# Patient Record
Sex: Female | Born: 1940
Health system: Southern US, Community
[De-identification: ages and names within clinical notes are randomized; demographics above are authoritative.]

## PROBLEM LIST (undated history)

## (undated) DIAGNOSIS — Z96619 Presence of unspecified artificial shoulder joint: Secondary | ICD-10-CM

## (undated) DIAGNOSIS — D649 Anemia, unspecified: Secondary | ICD-10-CM

## (undated) DIAGNOSIS — J189 Pneumonia, unspecified organism: Secondary | ICD-10-CM

## (undated) DIAGNOSIS — G4733 Obstructive sleep apnea (adult) (pediatric): Secondary | ICD-10-CM

## (undated) DIAGNOSIS — K219 Gastro-esophageal reflux disease without esophagitis: Secondary | ICD-10-CM

## (undated) DIAGNOSIS — Z8719 Personal history of other diseases of the digestive system: Secondary | ICD-10-CM

## (undated) DIAGNOSIS — I10 Essential (primary) hypertension: Secondary | ICD-10-CM

## (undated) DIAGNOSIS — T4145XA Adverse effect of unspecified anesthetic, initial encounter: Secondary | ICD-10-CM

## (undated) DIAGNOSIS — T8859XA Other complications of anesthesia, initial encounter: Secondary | ICD-10-CM

## (undated) DIAGNOSIS — M1990 Unspecified osteoarthritis, unspecified site: Secondary | ICD-10-CM

## (undated) DIAGNOSIS — C4491 Basal cell carcinoma of skin, unspecified: Secondary | ICD-10-CM

## (undated) HISTORY — DX: Anemia, unspecified: D64.9

## (undated) HISTORY — DX: Presence of unspecified artificial shoulder joint: Z96.619

## (undated) HISTORY — DX: Gastro-esophageal reflux disease without esophagitis: K21.9

## (undated) HISTORY — DX: Obstructive sleep apnea (adult) (pediatric): G47.33

## (undated) HISTORY — PX: SHOULDER ARTHROSCOPY: SHX128

## (undated) HISTORY — PX: ABDOMINAL HYSTERECTOMY: SHX81

## (undated) HISTORY — DX: Essential (primary) hypertension: I10

## (undated) HISTORY — DX: Unspecified osteoarthritis, unspecified site: M19.90

## (undated) HISTORY — PX: EYE SURGERY: SHX253

## (undated) HISTORY — PX: FINGER SURGERY: SHX640

## (undated) HISTORY — PX: NASAL SINUS SURGERY: SHX719

## (undated) HISTORY — PX: JOINT REPLACEMENT: SHX530

## (undated) HISTORY — PX: TONSILLECTOMY: SUR1361

## (undated) HISTORY — PX: OTHER SURGICAL HISTORY: SHX169

## (undated) HISTORY — DX: Pneumonia, unspecified organism: J18.9

## (undated) HISTORY — PX: UPPER GASTROINTESTINAL ENDOSCOPY: SHX188

---
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
## 1998-09-28 ENCOUNTER — Ambulatory Visit: Admission: RE | Admit: 1998-09-28 | Discharge: 1998-09-28 | Payer: Self-pay | Admitting: Emergency Medicine

## 1999-11-12 ENCOUNTER — Encounter: Payer: Self-pay | Admitting: Emergency Medicine

## 1999-11-12 ENCOUNTER — Encounter: Admission: RE | Admit: 1999-11-12 | Discharge: 1999-11-12 | Payer: Self-pay | Admitting: Emergency Medicine

## 1999-11-21 ENCOUNTER — Ambulatory Visit: Admission: RE | Admit: 1999-11-21 | Discharge: 1999-11-21 | Payer: Self-pay | Admitting: Emergency Medicine

## 1999-12-19 ENCOUNTER — Encounter: Admission: RE | Admit: 1999-12-19 | Discharge: 1999-12-19 | Payer: Self-pay | Admitting: Emergency Medicine

## 1999-12-19 ENCOUNTER — Encounter: Payer: Self-pay | Admitting: Emergency Medicine

## 2000-12-22 ENCOUNTER — Encounter: Payer: Self-pay | Admitting: Emergency Medicine

## 2000-12-22 ENCOUNTER — Encounter: Admission: RE | Admit: 2000-12-22 | Discharge: 2000-12-22 | Payer: Self-pay | Admitting: Emergency Medicine

## 2001-10-15 ENCOUNTER — Encounter: Admission: RE | Admit: 2001-10-15 | Discharge: 2001-10-15 | Payer: Self-pay | Admitting: Emergency Medicine

## 2001-10-15 ENCOUNTER — Encounter: Payer: Self-pay | Admitting: Emergency Medicine

## 2001-10-21 ENCOUNTER — Encounter: Payer: Self-pay | Admitting: Emergency Medicine

## 2001-10-21 ENCOUNTER — Encounter: Admission: RE | Admit: 2001-10-21 | Discharge: 2001-10-21 | Payer: Self-pay | Admitting: Emergency Medicine

## 2001-11-04 ENCOUNTER — Encounter: Admission: RE | Admit: 2001-11-04 | Discharge: 2001-11-04 | Payer: Self-pay | Admitting: Emergency Medicine

## 2001-11-04 ENCOUNTER — Encounter: Payer: Self-pay | Admitting: Emergency Medicine

## 2001-12-29 ENCOUNTER — Encounter: Admission: RE | Admit: 2001-12-29 | Discharge: 2001-12-29 | Payer: Self-pay | Admitting: Emergency Medicine

## 2001-12-29 ENCOUNTER — Encounter: Payer: Self-pay | Admitting: Emergency Medicine

## 2002-03-01 ENCOUNTER — Ambulatory Visit (HOSPITAL_COMMUNITY): Admission: RE | Admit: 2002-03-01 | Discharge: 2002-03-01 | Payer: Self-pay | Admitting: Emergency Medicine

## 2002-03-01 ENCOUNTER — Encounter: Payer: Self-pay | Admitting: Emergency Medicine

## 2003-01-10 ENCOUNTER — Encounter: Payer: Self-pay | Admitting: Emergency Medicine

## 2003-01-10 ENCOUNTER — Encounter: Admission: RE | Admit: 2003-01-10 | Discharge: 2003-01-10 | Payer: Self-pay | Admitting: Emergency Medicine

## 2003-05-13 ENCOUNTER — Ambulatory Visit (HOSPITAL_COMMUNITY): Admission: RE | Admit: 2003-05-13 | Discharge: 2003-05-13 | Payer: Self-pay | Admitting: Gastroenterology

## 2003-09-16 ENCOUNTER — Encounter: Admission: RE | Admit: 2003-09-16 | Discharge: 2003-11-09 | Payer: Self-pay | Admitting: Orthopedic Surgery

## 2004-05-29 ENCOUNTER — Ambulatory Visit (HOSPITAL_BASED_OUTPATIENT_CLINIC_OR_DEPARTMENT_OTHER): Admission: RE | Admit: 2004-05-29 | Discharge: 2004-05-29 | Payer: Self-pay | Admitting: Urology

## 2004-08-17 ENCOUNTER — Ambulatory Visit (HOSPITAL_COMMUNITY): Admission: RE | Admit: 2004-08-17 | Discharge: 2004-08-17 | Payer: Self-pay | Admitting: Urology

## 2004-11-05 ENCOUNTER — Encounter: Admission: RE | Admit: 2004-11-05 | Discharge: 2004-11-05 | Payer: Self-pay | Admitting: Emergency Medicine

## 2005-01-15 ENCOUNTER — Ambulatory Visit (HOSPITAL_BASED_OUTPATIENT_CLINIC_OR_DEPARTMENT_OTHER): Admission: RE | Admit: 2005-01-15 | Discharge: 2005-01-15 | Payer: Self-pay | Admitting: Urology

## 2005-05-07 ENCOUNTER — Encounter: Admission: RE | Admit: 2005-05-07 | Discharge: 2005-05-07 | Payer: Self-pay | Admitting: Emergency Medicine

## 2005-11-11 ENCOUNTER — Encounter: Admission: RE | Admit: 2005-11-11 | Discharge: 2005-11-11 | Payer: Self-pay | Admitting: Emergency Medicine

## 2005-12-04 ENCOUNTER — Encounter: Admission: RE | Admit: 2005-12-04 | Discharge: 2006-02-13 | Payer: Self-pay | Admitting: Emergency Medicine

## 2006-07-02 ENCOUNTER — Ambulatory Visit (HOSPITAL_BASED_OUTPATIENT_CLINIC_OR_DEPARTMENT_OTHER): Admission: RE | Admit: 2006-07-02 | Discharge: 2006-07-02 | Payer: Self-pay | Admitting: Orthopedic Surgery

## 2006-07-02 ENCOUNTER — Encounter (INDEPENDENT_AMBULATORY_CARE_PROVIDER_SITE_OTHER): Payer: Self-pay | Admitting: Specialist

## 2006-07-20 ENCOUNTER — Ambulatory Visit (HOSPITAL_BASED_OUTPATIENT_CLINIC_OR_DEPARTMENT_OTHER): Admission: RE | Admit: 2006-07-20 | Discharge: 2006-07-20 | Payer: Self-pay | Admitting: Emergency Medicine

## 2006-07-26 ENCOUNTER — Ambulatory Visit: Payer: Self-pay | Admitting: Internal Medicine

## 2006-08-28 ENCOUNTER — Ambulatory Visit (HOSPITAL_BASED_OUTPATIENT_CLINIC_OR_DEPARTMENT_OTHER): Admission: RE | Admit: 2006-08-28 | Discharge: 2006-08-28 | Payer: Self-pay | Admitting: Emergency Medicine

## 2006-09-07 ENCOUNTER — Ambulatory Visit: Payer: Self-pay | Admitting: Internal Medicine

## 2006-10-01 ENCOUNTER — Ambulatory Visit: Payer: Self-pay | Admitting: Pulmonary Disease

## 2006-11-03 ENCOUNTER — Ambulatory Visit: Payer: Self-pay | Admitting: Pulmonary Disease

## 2006-11-12 ENCOUNTER — Encounter: Admission: RE | Admit: 2006-11-12 | Discharge: 2006-11-12 | Payer: Self-pay | Admitting: Emergency Medicine

## 2007-02-24 ENCOUNTER — Ambulatory Visit: Payer: Self-pay | Admitting: Pulmonary Disease

## 2007-02-24 ENCOUNTER — Ambulatory Visit: Payer: Self-pay | Admitting: Cardiology

## 2007-03-17 ENCOUNTER — Ambulatory Visit: Payer: Self-pay | Admitting: Pulmonary Disease

## 2007-03-25 ENCOUNTER — Encounter (INDEPENDENT_AMBULATORY_CARE_PROVIDER_SITE_OTHER): Payer: Self-pay | Admitting: Specialist

## 2007-03-25 ENCOUNTER — Observation Stay (HOSPITAL_COMMUNITY): Admission: RE | Admit: 2007-03-25 | Discharge: 2007-03-26 | Payer: Self-pay | Admitting: Otolaryngology

## 2007-09-08 ENCOUNTER — Ambulatory Visit (HOSPITAL_BASED_OUTPATIENT_CLINIC_OR_DEPARTMENT_OTHER): Admission: RE | Admit: 2007-09-08 | Discharge: 2007-09-09 | Payer: Self-pay | Admitting: Orthopedic Surgery

## 2007-09-10 ENCOUNTER — Encounter: Admission: RE | Admit: 2007-09-10 | Discharge: 2007-10-02 | Payer: Self-pay | Admitting: Orthopedic Surgery

## 2007-10-01 ENCOUNTER — Encounter: Admission: RE | Admit: 2007-10-01 | Discharge: 2007-10-01 | Payer: Self-pay | Admitting: Orthopedic Surgery

## 2007-10-03 ENCOUNTER — Encounter: Admission: RE | Admit: 2007-10-03 | Discharge: 2007-12-02 | Payer: Self-pay | Admitting: Orthopedic Surgery

## 2007-10-14 DIAGNOSIS — K219 Gastro-esophageal reflux disease without esophagitis: Secondary | ICD-10-CM

## 2007-10-14 DIAGNOSIS — G4733 Obstructive sleep apnea (adult) (pediatric): Secondary | ICD-10-CM

## 2007-12-03 ENCOUNTER — Encounter: Admission: RE | Admit: 2007-12-03 | Discharge: 2008-03-02 | Payer: Self-pay | Admitting: Orthopedic Surgery

## 2008-01-07 ENCOUNTER — Encounter: Admission: RE | Admit: 2008-01-07 | Discharge: 2008-01-07 | Payer: Self-pay | Admitting: Emergency Medicine

## 2008-01-12 LAB — CONVERTED CEMR LAB: Pap Smear: NORMAL

## 2008-03-28 ENCOUNTER — Encounter: Payer: Self-pay | Admitting: Pulmonary Disease

## 2008-07-07 ENCOUNTER — Encounter: Payer: Self-pay | Admitting: Gastroenterology

## 2008-07-21 ENCOUNTER — Ambulatory Visit: Payer: Self-pay | Admitting: Gastroenterology

## 2008-07-21 DIAGNOSIS — M1990 Unspecified osteoarthritis, unspecified site: Secondary | ICD-10-CM

## 2008-07-21 DIAGNOSIS — Z87448 Personal history of other diseases of urinary system: Secondary | ICD-10-CM | POA: Insufficient documentation

## 2008-07-21 LAB — CONVERTED CEMR LAB
Albumin: 4.1 g/dL (ref 3.5–5.2)
Alkaline Phosphatase: 95 units/L (ref 39–117)
BUN: 18 mg/dL (ref 6–23)
CRP, High Sensitivity: 13 — ABNORMAL HIGH (ref 0.00–5.00)
Eosinophils Relative: 1.2 % (ref 0.0–5.0)
GFR calc non Af Amer: 67 mL/min
Glucose, Bld: 112 mg/dL — ABNORMAL HIGH (ref 70–99)
HCT: 43.1 % (ref 36.0–46.0)
Hemoglobin: 15.3 g/dL — ABNORMAL HIGH (ref 12.0–15.0)
Monocytes Absolute: 0.9 10*3/uL (ref 0.1–1.0)
Monocytes Relative: 11.7 % (ref 3.0–12.0)
Neutro Abs: 3.7 10*3/uL (ref 1.4–7.7)
Total Bilirubin: 0.9 mg/dL (ref 0.3–1.2)
WBC: 7.4 10*3/uL (ref 4.5–10.5)

## 2008-07-25 ENCOUNTER — Telehealth: Payer: Self-pay | Admitting: Gastroenterology

## 2008-07-27 ENCOUNTER — Ambulatory Visit (HOSPITAL_COMMUNITY): Admission: RE | Admit: 2008-07-27 | Discharge: 2008-07-27 | Payer: Self-pay | Admitting: Gastroenterology

## 2008-07-27 ENCOUNTER — Ambulatory Visit: Payer: Self-pay | Admitting: Gastroenterology

## 2008-07-27 ENCOUNTER — Encounter: Payer: Self-pay | Admitting: Gastroenterology

## 2008-08-01 ENCOUNTER — Telehealth: Payer: Self-pay | Admitting: Gastroenterology

## 2008-08-02 ENCOUNTER — Encounter: Payer: Self-pay | Admitting: Gastroenterology

## 2008-08-09 ENCOUNTER — Ambulatory Visit: Payer: Self-pay | Admitting: Gastroenterology

## 2008-08-09 DIAGNOSIS — K222 Esophageal obstruction: Secondary | ICD-10-CM

## 2008-08-15 ENCOUNTER — Telehealth: Payer: Self-pay | Admitting: Gastroenterology

## 2008-08-24 ENCOUNTER — Ambulatory Visit: Payer: Self-pay | Admitting: Gastroenterology

## 2008-08-25 ENCOUNTER — Telehealth: Payer: Self-pay | Admitting: Gastroenterology

## 2008-09-01 ENCOUNTER — Telehealth: Payer: Self-pay | Admitting: Gastroenterology

## 2008-09-03 ENCOUNTER — Encounter: Payer: Self-pay | Admitting: Gastroenterology

## 2008-09-05 ENCOUNTER — Encounter: Payer: Self-pay | Admitting: Gastroenterology

## 2008-09-05 ENCOUNTER — Telehealth: Payer: Self-pay | Admitting: Gastroenterology

## 2008-09-07 ENCOUNTER — Ambulatory Visit: Payer: Self-pay | Admitting: Gastroenterology

## 2008-09-07 LAB — CONVERTED CEMR LAB: TSH: 1.42 microintl units/mL (ref 0.35–5.50)

## 2008-09-14 ENCOUNTER — Ambulatory Visit (HOSPITAL_COMMUNITY): Admission: RE | Admit: 2008-09-14 | Discharge: 2008-09-14 | Payer: Self-pay | Admitting: Gastroenterology

## 2008-09-20 ENCOUNTER — Ambulatory Visit: Payer: Self-pay | Admitting: Gastroenterology

## 2008-09-20 LAB — CONVERTED CEMR LAB
ALT: 91 units/L — ABNORMAL HIGH (ref 0–35)
AST: 79 units/L — ABNORMAL HIGH (ref 0–37)
Alkaline Phosphatase: 92 units/L (ref 39–117)
Basophils Absolute: 0 10*3/uL (ref 0.0–0.1)
Basophils Relative: 0.5 % (ref 0.0–3.0)
Chloride: 102 meq/L (ref 96–112)
Creatinine, Ser: 1 mg/dL (ref 0.4–1.2)
Eosinophils Absolute: 0.1 10*3/uL (ref 0.0–0.7)
HCT: 38.1 % (ref 36.0–46.0)
MCHC: 34.8 g/dL (ref 30.0–36.0)
MCV: 94.7 fL (ref 78.0–100.0)
Monocytes Absolute: 0.8 10*3/uL (ref 0.1–1.0)
Neutro Abs: 3.3 10*3/uL (ref 1.4–7.7)
Neutrophils Relative %: 50.9 % (ref 43.0–77.0)
RBC: 4.02 M/uL (ref 3.87–5.11)
Total Bilirubin: 0.8 mg/dL (ref 0.3–1.2)
aPTT: 28.1 s (ref 21.7–29.8)

## 2008-09-22 ENCOUNTER — Encounter: Payer: Self-pay | Admitting: Gastroenterology

## 2008-09-23 ENCOUNTER — Telehealth: Payer: Self-pay | Admitting: Gastroenterology

## 2008-09-26 ENCOUNTER — Ambulatory Visit: Payer: Self-pay | Admitting: Gastroenterology

## 2008-09-27 ENCOUNTER — Encounter: Payer: Self-pay | Admitting: Gastroenterology

## 2008-09-27 ENCOUNTER — Ambulatory Visit: Payer: Self-pay | Admitting: Gastroenterology

## 2008-09-30 ENCOUNTER — Telehealth: Payer: Self-pay | Admitting: Gastroenterology

## 2008-09-30 ENCOUNTER — Encounter: Payer: Self-pay | Admitting: Gastroenterology

## 2008-10-10 ENCOUNTER — Ambulatory Visit: Payer: Self-pay | Admitting: Gastroenterology

## 2008-10-14 ENCOUNTER — Telehealth: Payer: Self-pay | Admitting: Gastroenterology

## 2008-10-31 ENCOUNTER — Ambulatory Visit: Payer: Self-pay | Admitting: Gastroenterology

## 2008-10-31 DIAGNOSIS — K5289 Other specified noninfective gastroenteritis and colitis: Secondary | ICD-10-CM

## 2008-11-09 ENCOUNTER — Telehealth: Payer: Self-pay | Admitting: Gastroenterology

## 2008-12-12 ENCOUNTER — Telehealth (INDEPENDENT_AMBULATORY_CARE_PROVIDER_SITE_OTHER): Payer: Self-pay | Admitting: *Deleted

## 2008-12-13 ENCOUNTER — Ambulatory Visit: Payer: Self-pay | Admitting: Internal Medicine

## 2008-12-14 DIAGNOSIS — R32 Unspecified urinary incontinence: Secondary | ICD-10-CM | POA: Insufficient documentation

## 2009-01-02 ENCOUNTER — Ambulatory Visit: Payer: Self-pay | Admitting: Gastroenterology

## 2009-01-10 ENCOUNTER — Ambulatory Visit: Payer: Self-pay | Admitting: Gastroenterology

## 2009-01-25 ENCOUNTER — Encounter: Admission: RE | Admit: 2009-01-25 | Discharge: 2009-01-25 | Payer: Self-pay | Admitting: Internal Medicine

## 2009-01-30 ENCOUNTER — Encounter: Admission: RE | Admit: 2009-01-30 | Discharge: 2009-01-30 | Payer: Self-pay | Admitting: Internal Medicine

## 2009-02-02 ENCOUNTER — Encounter: Payer: Self-pay | Admitting: Internal Medicine

## 2009-02-09 ENCOUNTER — Ambulatory Visit: Payer: Self-pay | Admitting: Internal Medicine

## 2009-02-09 ENCOUNTER — Other Ambulatory Visit: Admission: RE | Admit: 2009-02-09 | Discharge: 2009-02-09 | Payer: Self-pay | Admitting: Internal Medicine

## 2009-02-09 ENCOUNTER — Encounter: Payer: Self-pay | Admitting: Internal Medicine

## 2009-02-09 LAB — CONVERTED CEMR LAB
Albumin: 3.8 g/dL (ref 3.5–5.2)
Alkaline Phosphatase: 86 units/L (ref 39–117)
BUN: 13 mg/dL (ref 6–23)
Basophils Absolute: 0 10*3/uL (ref 0.0–0.1)
Calcium: 9.4 mg/dL (ref 8.4–10.5)
Cholesterol: 172 mg/dL (ref 0–200)
Creatinine, Ser: 0.8 mg/dL (ref 0.4–1.2)
Crystals: NEGATIVE
Eosinophils Absolute: 0.1 10*3/uL (ref 0.0–0.7)
GFR calc Af Amer: 92 mL/min
GFR calc non Af Amer: 76 mL/min
Glucose, Bld: 93 mg/dL (ref 70–99)
HCT: 39.1 % (ref 36.0–46.0)
HDL: 50.4 mg/dL (ref >39.0)
Hemoglobin, Urine: NEGATIVE
Leukocytes, UA: NEGATIVE
MCHC: 33.8 g/dL (ref 30.0–36.0)
MCV: 96.2 fL (ref 78.0–100.0)
Monocytes Absolute: 0.4 10*3/uL (ref 0.1–1.0)
Neutrophils Relative %: 58.8 % (ref 43.0–77.0)
Platelets: 174 10*3/uL (ref 150–400)
Potassium: 4.1 meq/L (ref 3.5–5.1)
Specific Gravity, Urine: 1.01 (ref 1.000–1.035)
TSH: 1.52 microintl units/mL (ref 0.35–5.50)
Total Protein: 6.5 g/dL (ref 6.0–8.3)
Triglycerides: 67 mg/dL (ref 0–149)
Urobilinogen, UA: 0.2 (ref 0.0–1.0)
VLDL: 13 mg/dL (ref 0–40)

## 2009-02-10 ENCOUNTER — Encounter: Payer: Self-pay | Admitting: Internal Medicine

## 2009-02-10 ENCOUNTER — Telehealth: Payer: Self-pay | Admitting: Internal Medicine

## 2009-02-23 ENCOUNTER — Telehealth: Payer: Self-pay | Admitting: Gastroenterology

## 2009-02-28 ENCOUNTER — Ambulatory Visit: Payer: Self-pay | Admitting: Internal Medicine

## 2009-03-13 ENCOUNTER — Encounter: Payer: Self-pay | Admitting: Internal Medicine

## 2009-03-13 ENCOUNTER — Ambulatory Visit: Payer: Self-pay | Admitting: Internal Medicine

## 2009-03-20 ENCOUNTER — Ambulatory Visit: Payer: Self-pay | Admitting: Internal Medicine

## 2009-03-23 ENCOUNTER — Telehealth: Payer: Self-pay | Admitting: Internal Medicine

## 2009-04-05 ENCOUNTER — Telehealth: Payer: Self-pay | Admitting: Internal Medicine

## 2009-06-21 ENCOUNTER — Telehealth: Payer: Self-pay | Admitting: Gastroenterology

## 2009-08-03 ENCOUNTER — Encounter: Admission: RE | Admit: 2009-08-03 | Discharge: 2009-08-03 | Payer: Self-pay | Admitting: Internal Medicine

## 2009-08-21 ENCOUNTER — Telehealth: Payer: Self-pay | Admitting: Gastroenterology

## 2009-08-30 ENCOUNTER — Ambulatory Visit: Payer: Self-pay | Admitting: Internal Medicine

## 2009-10-11 ENCOUNTER — Telehealth: Payer: Self-pay | Admitting: Gastroenterology

## 2009-10-20 ENCOUNTER — Telehealth: Payer: Self-pay | Admitting: Internal Medicine

## 2009-11-15 ENCOUNTER — Ambulatory Visit: Payer: Self-pay | Admitting: Internal Medicine

## 2009-11-16 ENCOUNTER — Encounter: Payer: Self-pay | Admitting: Internal Medicine

## 2009-12-07 ENCOUNTER — Encounter: Payer: Self-pay | Admitting: Internal Medicine

## 2009-12-08 ENCOUNTER — Telehealth: Payer: Self-pay | Admitting: Internal Medicine

## 2009-12-15 ENCOUNTER — Encounter: Payer: Self-pay | Admitting: Internal Medicine

## 2009-12-19 ENCOUNTER — Encounter: Payer: Self-pay | Admitting: Internal Medicine

## 2009-12-22 ENCOUNTER — Telehealth: Payer: Self-pay | Admitting: Internal Medicine

## 2009-12-28 ENCOUNTER — Encounter: Payer: Self-pay | Admitting: Internal Medicine

## 2009-12-28 ENCOUNTER — Telehealth: Payer: Self-pay | Admitting: Internal Medicine

## 2010-01-12 ENCOUNTER — Encounter: Payer: Self-pay | Admitting: Internal Medicine

## 2010-01-17 ENCOUNTER — Ambulatory Visit: Payer: Self-pay | Admitting: Internal Medicine

## 2010-01-26 ENCOUNTER — Encounter: Admission: RE | Admit: 2010-01-26 | Discharge: 2010-01-26 | Payer: Self-pay | Admitting: Internal Medicine

## 2010-01-26 LAB — HM MAMMOGRAPHY

## 2010-04-06 ENCOUNTER — Ambulatory Visit: Payer: Self-pay | Admitting: Internal Medicine

## 2010-04-06 LAB — CONVERTED CEMR LAB
Albumin: 4.2 g/dL (ref 3.5–5.2)
BUN: 15 mg/dL (ref 6–23)
Basophils Absolute: 0 10*3/uL (ref 0.0–0.1)
Basophils Relative: 0.7 % (ref 0.0–3.0)
Bilirubin, Direct: 0.1 mg/dL (ref 0.0–0.3)
CO2: 31 meq/L (ref 19–32)
Calcium: 9.6 mg/dL (ref 8.4–10.5)
Chloride: 101 meq/L (ref 96–112)
Cholesterol: 180 mg/dL (ref 0–200)
Eosinophils Absolute: 0.1 10*3/uL (ref 0.0–0.7)
GFR calc non Af Amer: 88.59 mL/min (ref >60)
Glucose, Bld: 87 mg/dL (ref 70–99)
HDL: 56.6 mg/dL (ref >39.00)
Lymphocytes Relative: 35.2 % (ref 12.0–46.0)
MCHC: 34.7 g/dL (ref 30.0–36.0)
MCV: 96 fL (ref 78.0–100.0)
Monocytes Absolute: 0.5 10*3/uL (ref 0.1–1.0)
Neutrophils Relative %: 51.3 % (ref 43.0–77.0)
RBC: 4.36 M/uL (ref 3.87–5.11)
RDW: 12.8 % (ref 11.5–14.6)
Total CHOL/HDL Ratio: 3
Total Protein: 6.8 g/dL (ref 6.0–8.3)
Triglycerides: 97 mg/dL (ref 0.0–149.0)

## 2010-05-21 ENCOUNTER — Encounter: Payer: Self-pay | Admitting: Internal Medicine

## 2010-09-10 ENCOUNTER — Encounter: Payer: Self-pay | Admitting: Internal Medicine

## 2010-09-18 ENCOUNTER — Ambulatory Visit: Payer: Self-pay | Admitting: Internal Medicine

## 2010-09-20 ENCOUNTER — Encounter: Payer: Self-pay | Admitting: Internal Medicine

## 2010-12-06 ENCOUNTER — Encounter: Payer: Self-pay | Admitting: Internal Medicine

## 2010-12-07 LAB — URINALYSIS, ROUTINE W REFLEX MICROSCOPIC
Bilirubin Urine: NEGATIVE
Hemoglobin, Urine: NEGATIVE
Ketones, ur: NEGATIVE mg/dL
Nitrite: NEGATIVE
Protein, ur: NEGATIVE mg/dL
Specific Gravity, Urine: 1.01 (ref 1.005–1.030)
Urine Glucose, Fasting: NEGATIVE mg/dL
Urobilinogen, UA: 0.2 mg/dL (ref 0.0–1.0)
pH: 7.5 (ref 5.0–8.0)

## 2010-12-07 LAB — COMPREHENSIVE METABOLIC PANEL
ALT: 19 U/L (ref 0–35)
AST: 20 U/L (ref 0–37)
Albumin: 4.2 g/dL (ref 3.5–5.2)
Alkaline Phosphatase: 82 U/L (ref 39–117)
BUN: 16 mg/dL (ref 6–23)
CO2: 32 mEq/L (ref 19–32)
Calcium: 10 mg/dL (ref 8.4–10.5)
Chloride: 100 mEq/L (ref 96–112)
Creatinine, Ser: 0.78 mg/dL (ref 0.4–1.2)
GFR calc Af Amer: >60 mL/min (ref >60)
GFR calc non Af Amer: >60 mL/min (ref >60)
Glucose, Bld: 97 mg/dL (ref 70–99)
Potassium: 3.8 mEq/L (ref 3.5–5.1)
Sodium: 141 mEq/L (ref 135–145)
Total Bilirubin: 0.9 mg/dL (ref 0.3–1.2)
Total Protein: 6.8 g/dL (ref 6.0–8.3)

## 2010-12-07 LAB — CBC
HCT: 41.3 % (ref 36.0–46.0)
Hemoglobin: 14.6 g/dL (ref 12.0–15.0)
MCH: 33.7 pg (ref 26.0–34.0)
MCHC: 35.4 g/dL (ref 30.0–36.0)
MCV: 95.4 fL (ref 78.0–100.0)
Platelets: 201 10*3/uL (ref 150–400)
RBC: 4.33 MIL/uL (ref 3.87–5.11)
RDW: 13.6 % (ref 11.5–15.5)
WBC: 10.6 10*3/uL — ABNORMAL HIGH (ref 4.0–10.5)

## 2010-12-07 LAB — APTT: aPTT: 27 seconds (ref 24–37)

## 2010-12-07 LAB — PROTIME-INR
INR: 0.93 (ref 0.00–1.49)
Prothrombin Time: 12.7 seconds (ref 11.6–15.2)

## 2010-12-13 ENCOUNTER — Ambulatory Visit (HOSPITAL_COMMUNITY)
Admission: RE | Admit: 2010-12-13 | Discharge: 2010-12-14 | Payer: Self-pay | Source: Home / Self Care | Attending: Orthopedic Surgery | Admitting: Orthopedic Surgery

## 2010-12-17 LAB — SURGICAL PCR SCREEN
MRSA, PCR: NEGATIVE
Staphylococcus aureus: NEGATIVE

## 2010-12-23 ENCOUNTER — Encounter: Payer: Self-pay | Admitting: Cardiology

## 2010-12-23 ENCOUNTER — Encounter: Payer: Self-pay | Admitting: Gastroenterology

## 2010-12-28 NOTE — Op Note (Signed)
Veronica, Garza NO.:  [id number]  MEDICAL RECORD NO.:  [id number]          PATIENT TYPE:  OIB  LOCATION:  5040                         FACILITY:  [facility]  PHYSICIAN:  Vania Rea. Supple, M.D.  DATE OF BIRTH:  02-11-1941  DATE OF PROCEDURE:  12/13/2010 DATE OF DISCHARGE:                              OPERATIVE REPORT   PREOPERATIVE DIAGNOSIS:  End-stage left shoulder osteoarthrosis.  POSTOPERATIVE DIAGNOSIS:  End-stage left shoulder osteoarthrosis.  PROCEDURE:  Left total shoulder arthroplasty utilizing a Press-Fit, size #10 DePuy global stem with a 44 x 15 humeral head and a cemented pegged 40 glenoid.  SURGEON:  Vania Rea. Supple, MD  ASSISTANT:  Lucita Lora. Shuford, PAC  ANESTHESIA:  General endotracheal as well as an interscalene block.  ESTIMATED BLOOD LOSS:  300 mL.  DRAINS:  None.  HISTORY:  [name] is a 70 year old female who has had chronic and progressive increasing left shoulder pain secondary to end-stage osteoarthrosis with symptoms that have been refractory to prolonged attempts at conservative management.  She was brought to the operating at this time for planned left total shoulder arthroplasty.  Preoperatively, counseled Ms. Lender on treatment options as well as risks versus benefits thereof.  Possible surgical complications were reviewed including potential for bleeding, infection, neurovascular injury, persistent pain, loss of motion, failure of the implant, anesthetic complication, possible need for additional surgery, the patient understands and accepts and agrees with our planned procedure.  PROCEDURE IN DETAIL:  After undergoing routine preop evaluation, the patient received prophylactic antibiotics and an interscalene block was established in the holding area by the Anesthesia Department.  He was placed supine on the operating table, underwent smooth induction of general endotracheal anesthesia.  He was placed into  beach-chair position and appropriately padded and protected.  The left shoulder girdle region was sterilely prepped and draped in standard fashion. Time-out was called.  Initial examination showed severe restrictions in mobility with only 10 degrees of internal and external rotation and 40 degrees of abduction.  A standard anterior approach was made to the left shoulder through the deltopectoral interval with an incision approximately 15 cm in length.  Skin flaps were elevated and dissection carried deeply, developing the deltopectoral interval with the cephalic vein retracted laterally.  Self-retaining retractors were placed. Dissection was carried deeply with adhesions divided in the subacromial/subdeltoid bursa and the conjoined tendon was then divided. We then dissected free and retracted medially.  We then tenotomized the biceps which was actually already auto-tenodesed within the bicipital groove, having been previously scarified in this area.  We then divided the subscapularis approximately 2 cm from its distal insertion, leaving a soft tissue for later repair.  The rotator interval was then split to the base of the coracoid.  The free margin of the subscapularis was intact with #2 FiberWire sutures and the anterior circumflex vessels were electrocoagulated.  Dissection carried down inferiorly below the humeral head, allowing external rotation of the humerus and delivery of the humeral head through the wound with release of the capsule inferiorly and posteriorly.  There were very large osteophytes that had formed at the margin  of the humeral head and these were removed with an osteotome.  We then used an extramedullary guide to mark the proposed humeral head resection point and then with the rotator cuff carefully protected, we used an oscillating saw to resect the humeral head which stayed at the back table and measured, showing between 40 and 44 head size.  At this point, we  prepared the humerus with hand reaming up to size 10 and then performed the broaching, maintaining approximately 30 degrees of retroversion.  We then broached up to size 10 with excellent fit.  At this point, we removed the broach, placed a metal cap on the cut surface of the proximal humerus, and then prepared for exposure of the glenoid with a DePuy, the retractor placed posteriorly, pitchfork anteriorly.  We mobilized the subscapularis circumferentially and then we performed a circumferential resection of the labrum including the proximal stump of the biceps.  There was significant surrounding osteophytes as well as some moderate retroversion.  The glenoid was sized between 40 and 44.  We started with a 44 and performed a reaming with a subtle correction of retroversion and did find that the glenoid was significantly deficient anteriorly and posteriorly and so once we achieved the exposure of subchondral bone appropriately, we converted to the use of the 40 reamer and this did show proper coverage of the glenoid with good fit of the construct.  The peripheral osteophytes were removed with rongeur.  A central stabilizing hole was then reamed over the guide pin and in this peripheral stabilizing, the peg holes were completed.  Trial 40 was placed with good fit.  The glenoid was then irrigated.  We mixed cement and placed the cement into the superior and posterior, inferior, stabilizing holes.  The anterior hole was somewhat deficient, so did not use cement in it.  The final glenoid was then impacted into position, obtaining good purchase and good fixation and good stability.  Once the cement had hardened, we then returned our attention to the proximal humerus.  The canal was irrigated.  The final size 10 implant was obtained and then with bone harvested from the resected humeral head, we performed bone grafting in the metaphyseal aspect of the humerus and performed final impaction of the  humeral stem, obtaining excellent fit.  We then performed some trial reductions and the 44 x 15 head provided the best coverage of the proximal humerus and soft tissue balance, allowing 50% translation of the humeral head on the glenoid.  At this point, final inspection and irrigation was then completed.  Hemostasis was obtained.  The deltopectoral interval was then closed with a tag of #2 FiberWire at its midpoint and then remaining interval was reapproximated with 0 Vicryl.  2-0 Vicryl used for the subcu and intracuticular 3-0 Monocryl for the skin followed by Steri-Strips.  Dry dressing applied.  Left arm was placed in a sling and the patient was then awakened, extubated, and taken to recovery room in stable condition.     Vania Rea. Supple, M.D.     KMS/MEDQ  D:  12/13/2010  T:  12/14/2010  Job:  [id number]  Electronically Signed by Francena Hanly M.D. on 12/26/2010 07:44:01 AM

## 2011-01-01 NOTE — Consult Note (Signed)
Summary: [facility]   Imported By: Lester Grant 12/25/2009 13:55:16  _____________________________________________________________________  External Attachment:    Type:   Image     Comment:   External Document

## 2011-01-01 NOTE — Medication Information (Signed)
Summary: Celebrex/[employer]  Celebrex/[employer]   Imported By: Sherian Rein 01/04/2010 08:22:40  _____________________________________________________________________  External Attachment:    Type:   Image     Comment:   External Document

## 2011-01-01 NOTE — Letter (Signed)
Summary: [facility]   Imported By: Lester  05/31/2010 08:36:14  _____________________________________________________________________  External Attachment:    Type:   Image     Comment:   External Document

## 2011-01-01 NOTE — Letter (Signed)
Summary: Lipid Letter  [facility]  [address] [place], [place] [address]   Phone: [phone]  Fax: [phone]    04/06/2010  Veronica Garza [address] [place], [place]  [address]  Dear [name]:  We have carefully reviewed your last lipid profile from 04/06/2010 and the results are noted below with a summary of recommendations for lipid management.    Cholesterol:       180     Goal: <200   HDL "good" Cholesterol:   36.64     Goal: >40   LDL "bad" Cholesterol:   104     Goal: <130   Triglycerides:       97.0     Goal: <150        TLC Diet (Therapeutic Lifestyle Change): Saturated Fats & Transfatty acids should be kept < 7% of total calories ***Reduce Saturated Fats Polyunstaurated Fat can be up to 10% of total calories Monounsaturated Fat Fat can be up to 20% of total calories Total Fat should be no greater than 25-35% of total calories Carbohydrates should be 50-60% of total calories Protein should be approximately 15% of total calories Fiber should be at least 20-30 grams a day ***Increased fiber may help lower LDL Total Cholesterol should be < 200mg /day Consider adding plant stanol/sterols to diet (example: Benacol spread) ***A higher intake of unsaturated fat may reduce Triglycerides and Increase HDL    Adjunctive Measures (may lower LIPIDS and reduce risk of Heart Attack) include: Aerobic Exercise (20-30 minutes 3-4 times a week) Limit Alcohol Consumption Weight Reduction Aspirin 75-81 mg a day by mouth (if not allergic or contraindicated) Dietary Fiber 20-30 grams a day by mouth     Current Medications: 1)    Claritin 10 Mg  Tabs (Loratadine) .... Take 1 tablet by mouth once a day as needed 2)    Centrum   Tabs (Multiple vitamins-minerals) .... Take 1 tablet by mouth once a day 3)    Caltrate 600+d 600-400 Mg-unit  Tabs (Calcium carbonate-vitamin d) .... Take 1 tablet by mouth once a day 4)    Xanax 0.5 Mg  Tabs (Alprazolam) .... Take  one by mouth at bedtime as needed 5)    Celebrex 200 Mg Caps (Celecoxib) .... Once daily 6)    Est Estrogens-methyltest Hs 0.625-1.25 Mg Tabs (Est estrogens-methyltest) .Marland Kitchen.. 1 tab by mouth once daily 7)    Voltaren 1 % Gel (Diclofenac sodium) 8)    Tramadol Hcl 50 Mg Tabs (Tramadol hcl) .... 2-3 qd 9)    Magnesium  10)    Pepcid Complete  11)    Osteo Bi-flex  12)    Cipro 500 Mg Tab (Ciprofloxacin hcl) .... Take 1 tablet by mouth morning and night x 10 days  If you have any questions, please call. We appreciate being able to work with you.   Sincerely,    [facility] Etta Grandchild MD

## 2011-01-01 NOTE — Progress Notes (Signed)
Summary: TRIAGE  ---- Converted from flag ---- ---- 09/30/2008 10:02 AM, Louis Meckel MD wrote: Tell pt small bowel biopsies were negative.  I've sent in a prescription for prednisone.  She should begin 4 tabs daily.  If she has trouble sleeping you can call in  xanax 0.5mg  at bedtime. I'd like to see her the week I return. ------------------------------  Phone Note Outgoing Call   Call placed by: Laureen Ochs LPN,  September 30, 2008 10:48 AM Call placed to: Patient Summary of Call: Above MD instructions reviewed with Ms.Elwyn Reach. She will see Dr.Kameela Leipold on 11-9-9 at 2:30pm. Pt. instructed to call back as needed.  Initial call taken by: Laureen Ochs LPN,  September 30, 2008 11:18 AM    New/Updated Medications: XANAX 0.5 MG  TABS (ALPRAZOLAM) Take one by mouth at bedtime as needed   Prescriptions: XANAX 0.5 MG  TABS (ALPRAZOLAM) Take one by mouth at bedtime as needed  #30 x 1   Entered by:   Laureen Ochs LPN   Authorized by:   Louis Meckel MD   Signed by:   Laureen Ochs LPN on 29/56/2130   Method used:   Telephoned to ...       [facility]       [address]       [place], [place]  [address]       Ph: [phone] or [phone]       Fax: [phone]   RxID:   [id number]

## 2011-01-01 NOTE — Letter (Signed)
Summary: [facility]   Imported By: Lester [place] 09/28/2010 08:45:08  _____________________________________________________________________  External Attachment:    Type:   Image     Comment:   External Document

## 2011-01-01 NOTE — Assessment & Plan Note (Signed)
Summary: FLU VAC TLJ  STC  Nurse Visit   Vital Signs:  Patient profile:   70 year old female Temp:     97.1 degrees F oral  Vitals Entered By: Lanier Prude, CMA(AAMA) (September 18, 2010 11:54 AM)  Allergies: 1)  ! Naprosyn 2)  ! Sulfa 3)  ! Neosporin 4)  ! * Ivp  Dye  Orders Added: 1)  Flu Vaccine 53yrs + MEDICARE PATIENTS [Q2039] 2)  Administration Flu vaccine - MCR [G0008] .lbmedflu   Flu Vaccine Consent Questions     Do you have a history of severe allergic reactions to this vaccine? no    Any prior history of allergic reactions to egg and/or gelatin? no    Do you have a sensitivity to the preservative Thimersol? no    Do you have a past history of Guillan-Barre Syndrome? no    Do you currently have an acute febrile illness? no    Have you ever had a severe reaction to latex? no    Vaccine information given and explained to patient? yes    Are you currently pregnant? no    Lot Number:[id number]   Exp Date:06/01/2011   Site Given Right Deltoid IM Lanier Prude, [facility])  September 18, 2010 11:55 AM

## 2011-01-01 NOTE — Letter (Signed)
Summary: [facility]  [facility]   Imported By: Sherian Rein 09/12/2010 09:10:37  _____________________________________________________________________  External Attachment:    Type:   Image     Comment:   External Document

## 2011-01-01 NOTE — Letter (Signed)
Summary: Results Follow-up Letter  [facility]  [address] [place], [place] [address]   Phone: [phone]  Fax: [phone]    04/06/2010  [address] [place], [place]  [address]  Dear Ms. Pennings,   The following are the results of your recent test(s):  Test     Result     CBC       normal Liver/kidney   normal Thyroid     normal   _________________________________________________________  Please call for an appointment Or _________________________________________________________ _________________________________________________________ _________________________________________________________  Sincerely,  Sanda Linger MD [facility]

## 2011-01-01 NOTE — Assessment & Plan Note (Signed)
Summary: PER LAKISHA--2 MTH FU  STC   Vital Signs:  Patient profile:   70 year old female Height:      60 inches Weight:      175 pounds BMI:     34.30 O2 Sat:      96 % on Room air Temp:     97.2 degrees F oral Pulse rate:   62 / minute Pulse rhythm:   regular Resp:     16 per minute BP sitting:   138 / 72  (left arm) Cuff size:   large  Vitals Entered By: Rock Nephew CMA (January 17, 2010 2:14 PM)  Nutrition Counseling: Patient's BMI is greater than 25 and therefore counseled on weight management options.  O2 Flow:  Room air  Primary Care Provider:  Etta Grandchild MD   History of Present Illness: She returns for f/up and is having severe heartburn. Her insurance co. will not pay for Aciphex so she has taken OTC Prilosec 20 mg 2 per day for the last 2 months and feels miserable.  Her TMJ  pain is much better. She stopped Celebrex but Tramadol helps a lot.  Dyspepsia History:      She has no alarm features of dyspepsia including no history of melena, hematochezia, dysphagia, persistent vomiting, or involuntary weight loss > 5%.  There is a prior history of GERD.  The patient does not have a prior history of documented ulcer disease.  The dominant symptom is heartburn or acid reflux.  An H-2 blocker medication is currently being taken.  She notes that the symptoms have not improved with the H-2 blocker therapy.  Symptoms have persisted after 4 weeks of H-2 blocker treatment.  A prior EGD has been done which showed moderate or severe esophagitis.     Current Medications (verified): 1)  Claritin 10 Mg  Tabs (Loratadine) .... Take 1 Tablet By Mouth Once A Day As Needed 2)  Centrum   Tabs (Multiple Vitamins-Minerals) .... Take 1 Tablet By Mouth Once A Day 3)  Caltrate 600+d 600-400 Mg-Unit  Tabs (Calcium Carbonate-Vitamin D) .... Take 1 Tablet By Mouth Once A Day 4)  Aciphex 20 Mg  Tbec (Rabeprazole Sodium) .... Take 1 Each Day 30 Minutes Before Meals 5)  Xanax 0.5 Mg  Tabs  (Alprazolam) .... Take One By Mouth At Bedtime As Needed 6)  Celebrex 200 Mg Caps (Celecoxib) .... Once Daily 7)  Est Estrogens-Methyltest Hs 0.625-1.25 Mg Tabs (Est Estrogens-Methyltest) .Marland Kitchen.. 1 Tab By Mouth Once Daily 8)  Voltaren 1 % Gel (Diclofenac Sodium) 9)  Tramadol Hcl 50 Mg Tabs (Tramadol Hcl) .... 2-3 Qd  Allergies (verified): 1)  ! Naprosyn 2)  ! Sulfa 3)  ! Neosporin 4)  ! * Ivp  Dye  Past History:  Past Medical History: Reviewed history from 12/13/2008 and no changes required. Current Problems:  UTI'S, HX OF (ICD-V13.00) Hx of PNEUMONIA (ICD-486) ARTHRITIS (ICD-716.90) GERD (ICD-530.81) OBSTRUCTIVE SLEEP APNEA (ICD-327.23) COPD Urinary incontinence  Past Surgical History: Reviewed history from 10/31/2008 and no changes required. Hysterectomy Vaginal Sling sinus surgery  shoulder arthroscopy- left  Family History: Reviewed history from 10/31/2008 and no changes required. No FH of Colon Cancer: Family History of Ovarian Cancer: Sister Family History of Prostate Cancer: Father Family History of Diabetes: Sister  Social History: Reviewed history from 10/31/2008 and no changes required. Married, 1 boy Retired Patient has never smoked.  Alcohol Use - no Illicit Drug Use - no Patient gets regular exercise.  Review  of Systems       The patient complains of severe indigestion/heartburn.  The patient denies anorexia, fever, weight loss, weight gain, chest pain, abdominal pain, melena, hematochezia, hematuria, depression, enlarged lymph nodes, and angioedema.    Physical Exam  General:  alert, well-developed, well-nourished, well-hydrated, appropriate dress, and overweight-appearing.   Mouth:  there is crepitance on both tmj's, L>R, but there is good rom. Neck:  supple, full ROM, no masses, no thyromegaly, and no thyroid nodules or tenderness.   Lungs:  Normal respiratory effort, chest expands symmetrically. Lungs are clear to auscultation, no crackles or  wheezes. Heart:  Grade  2 /6 systolic ejection murmur over LLSB.  normal rate, regular rhythm, no gallop, no rub, no JVD. Abdomen:  soft, non-tender, normal bowel sounds, no distention, no masses, no hepatomegaly, and no splenomegaly.   Msk:  normal ROM, no joint tenderness, and no joint swelling.   Pulses:  R and L carotid,radial,femoral,dorsalis pedis and posterior tibial pulses are full and equal bilaterally Extremities:  No clubbing, cyanosis, edema, or deformity noted with normal full range of motion of all joints.  varicosities over both LE's Neurologic:  No cranial nerve deficits noted. Station and gait are normal. Plantar reflexes are down-going bilaterally. DTRs are symmetrical throughout. Sensory, motor and coordinative functions appear intact. Skin:  Intact without suspicious lesions or rashes Psych:  Cognition and judgment appear intact. Alert and cooperative with normal attention span and concentration. No apparent delusions, illusions, hallucinations   Impression & Recommendations:  Problem # 1:  TEMPOROMANDIBULAR JOINT DISORDER (ICD-524.60) Assessment Improved  Problem # 2:  GERD (ICD-530.81) Assessment: Deteriorated  The following medications were removed from the medication list:    Aciphex 20 Mg Tbec (Rabeprazole sodium) .Marland Kitchen... Take 1 each day 30 minutes before meals Her updated medication list for this problem includes:    Dexilant 60 Mg Cpdr (Dexlansoprazole) ..... One by mouth once daily for heartburn  Orders: Prescription Created Electronically [phone])  EGD: Location: [facility]   (01/10/2009)  Labs Reviewed: Hgb: 13.2 (02/09/2009)   Hct: 39.1 (02/09/2009)  Problem # 3:  ELEVATED BLOOD PRESSURE WITHOUT DIAGNOSIS OF HYPERTENSION (ICD-796.2) Assessment: Improved  BP today: 138/72 Prior BP: 122/80 (11/15/2009)  Labs Reviewed: Creat: 0.8 (02/09/2009) Chol: 172 (02/09/2009)   HDL: 50.4 (02/09/2009)   LDL: 108 (02/09/2009)   TG: 67  (02/09/2009)  Instructed in low sodium diet (DASH Handout) and behavior modification.    Complete Medication List: 1)  Claritin 10 Mg Tabs (Loratadine) .... Take 1 tablet by mouth once a day as needed 2)  Centrum Tabs (Multiple vitamins-minerals) .... Take 1 tablet by mouth once a day 3)  Caltrate 600+d 600-400 Mg-unit Tabs (Calcium carbonate-vitamin d) .... Take 1 tablet by mouth once a day 4)  Xanax 0.5 Mg Tabs (Alprazolam) .... Take one by mouth at bedtime as needed 5)  Celebrex 200 Mg Caps (Celecoxib) .... Once daily 6)  Est Estrogens-methyltest Hs 0.625-1.25 Mg Tabs (Est estrogens-methyltest) .Marland Kitchen.. 1 tab by mouth once daily 7)  Voltaren 1 % Gel (Diclofenac sodium) 8)  Tramadol Hcl 50 Mg Tabs (Tramadol hcl) .... 2-3 qd 9)  Dexilant 60 Mg Cpdr (Dexlansoprazole) .... One by mouth once daily for heartburn  Dyspepsia Assessment/Plan:  Step Therapy: GERD Treatment Protocols:    Step-1: improved  Patient Instructions: 1)  Avoid foods high in acid (tomatoes, citrus juices, spicy foods). Avoid eating within two hours of lying down or before exercising. Do not over eat; try smaller more frequent meals.  Elevate head of bed twelve inches when sleeping. 2)  It is important that you exercise regularly at least 20 minutes 5 times a week. If you develop chest pain, have severe difficulty breathing, or feel very tired , stop exercising immediately and seek medical attention. 3)  You need to lose weight. Consider a lower calorie diet and regular exercise.  4)  Please schedule a follow-up appointment in 3 months. Prescriptions: DEXILANT 60 MG CPDR (DEXLANSOPRAZOLE) One by mouth once daily for heartburn  #100 x 0   Entered and Authorized by:   Etta Grandchild MD   Signed by:   Etta Grandchild MD on 01/17/2010   Method used:   Samples Given   RxID:   [id number]

## 2011-01-01 NOTE — Progress Notes (Signed)
Summary: REFERRAL  Phone Note Call from Patient Call back at Home Phone [phone]   Summary of Call: Dr Rachel Bo, an oral surgeon, advised pt that she has arthritis in her jaw. He suggested that Dr Yetta Barre refer pt to specialist.  Initial call taken by: Lamar Sprinkles, CMA,  December 08, 2009 10:04 AM  Follow-up for Phone Call        done Follow-up by: Etta Grandchild MD,  December 08, 2009 10:07 AM

## 2011-01-01 NOTE — Progress Notes (Signed)
  Phone Note Call from Patient Call back at Work Phone [phone]   Summary of Call: Patient is requesting samples of aciphex.  Initial call taken by: Lamar Sprinkles, CMA,  December 22, 2009 4:20 PM  Follow-up for Phone Call        pt came in 3 bottle of aciphex samples given to pt Follow-up by: Ami Bullins CMA,  December 22, 2009 4:25 PM

## 2011-01-01 NOTE — Progress Notes (Signed)
Summary: Celebrex PA  Phone Note From Pharmacy   Caller: Tedd Sias [phone] Call For: ID:  [id number]  Summary of Call: PA request--Celebrex.  In order for Celebrex to be covered the patient must have tried 2 NSAIDS at prescription strength--they will fax form. Initial call taken by: Lucious Groves,  December 28, 2009 1:26 PM  Follow-up for Phone Call        pending MD completion. Follow-up by: Lucious Groves,  December 28, 2009 2:24 PM  Additional Follow-up for Phone Call Additional follow up Details #1::        patient form was complete and faxed on 12-28-09. **Papers sent to scanning.  I called to check the status and was notified that the prescription has been denied. Patient must try and fail one more NSAID at prescription strength then we can try PA again. Please advise.   Additional Follow-up by: Lucious Groves,  January 02, 2010 2:47 PM

## 2011-01-01 NOTE — Assessment & Plan Note (Signed)
Summary: CPX/WILL COME FASTING/#/CD   Vital Signs:  Patient profile:   70 year old female Height:      60 inches Weight:      177.75 pounds BMI:     34.84 O2 Sat:      97 % on Room air Temp:     98.1 degrees F oral Pulse rate:   63 / minute Pulse rhythm:   regular Resp:     16 per minute BP sitting:   128 / 84  (left arm) Cuff size:   large  Vitals Entered By: Rock Nephew CMA (Apr 06, 2010 9:06 AM)  Nutrition Counseling: Patient's BMI is greater than 25 and therefore counseled on weight management options.  O2 Flow:  Room air CC: CPX w/labs, Preventive Care Is Patient Diabetic? No Pain Assessment Patient in pain? no        Primary Care Provider:  Etta Grandchild MD  CC:  CPX w/labs and Preventive Care.  History of Present Illness: She returns for a complete physical. She is going to [place] next month and she wants an Rx for Cipro in the event that she gets traveler's diarrhea. She needs a Tdap but she DOES NOT want a pneumovax.  Current Medications (verified): 1)  Claritin 10 Mg  Tabs (Loratadine) .... Take 1 Tablet By Mouth Once A Day As Needed 2)  Centrum   Tabs (Multiple Vitamins-Minerals) .... Take 1 Tablet By Mouth Once A Day 3)  Caltrate 600+d 600-400 Mg-Unit  Tabs (Calcium Carbonate-Vitamin D) .... Take 1 Tablet By Mouth Once A Day 4)  Xanax 0.5 Mg  Tabs (Alprazolam) .... Take One By Mouth At Bedtime As Needed 5)  Celebrex 200 Mg Caps (Celecoxib) .... Once Daily 6)  Est Estrogens-Methyltest Hs 0.625-1.25 Mg Tabs (Est Estrogens-Methyltest) .Marland Kitchen.. 1 Tab By Mouth Once Daily 7)  Voltaren 1 % Gel (Diclofenac Sodium) 8)  Tramadol Hcl 50 Mg Tabs (Tramadol Hcl) .... 2-3 Qd 9)  Magnesium 10)  Pepcid Complete 11)  Osteo Bi-Flex  Allergies (verified): 1)  ! Naprosyn 2)  ! Sulfa 3)  ! Neosporin 4)  ! * Ivp  Dye  Past History:  Past Medical History: Current Problems:  UTI'S, HX OF (ICD-V13.00) Hx of PNEUMONIA (ICD-486) ARTHRITIS (ICD-716.90) GERD  (ICD-530.81) OBSTRUCTIVE SLEEP APNEA (ICD-327.23) Urinary incontinence  Past Surgical History: Reviewed history from 10/31/2008 and no changes required. Hysterectomy Vaginal Sling sinus surgery  shoulder arthroscopy- left  Family History: Reviewed history from 10/31/2008 and no changes required. No FH of Colon Cancer: Family History of Ovarian Cancer: Sister Family History of Prostate Cancer: Father Family History of Diabetes: Sister  Social History: Reviewed history from 10/31/2008 and no changes required. Married, 1 boy Retired Patient has never smoked.  Alcohol Use - no Illicit Drug Use - no Patient gets regular exercise.  Review of Systems  The patient denies anorexia, fever, weight loss, weight gain, decreased hearing, chest pain, syncope, dyspnea on exertion, peripheral edema, prolonged cough, headaches, hemoptysis, abdominal pain, melena, hematochezia, severe indigestion/heartburn, hematuria, incontinence, suspicious skin lesions, difficulty walking, depression, abnormal bleeding, enlarged lymph nodes, angioedema, and breast masses.    Physical Exam  General:  alert, well-developed, well-nourished, well-hydrated, appropriate dress, normal appearance, healthy-appearing, cooperative to examination, and good hygiene.   Head:  normocephalic, atraumatic, no abnormalities observed, and no abnormalities palpated.   Eyes:  vision grossly intact, pupils equal, pupils round, and pupils reactive to light.   Ears:  R ear normal and L ear normal.  Mouth:  Oral mucosa and oropharynx without lesions or exudates.  Teeth in good repair. Neck:  supple, full ROM, no masses, no thyromegaly, no thyroid nodules or tenderness, no JVD, normal carotid upstroke, no carotid bruits, no cervical lymphadenopathy, and no neck tenderness.   Chest Wall:  No deformities, masses, or tenderness noted. Breasts:  No mass, nodules, thickening, tenderness, bulging, retraction, inflamation, nipple discharge  or skin changes noted.   Lungs:  normal respiratory effort, no intercostal retractions, no accessory muscle use, normal breath sounds, no dullness, no fremitus, no crackles, and no wheezes.   Heart:  normal rate, regular rhythm, no murmur, no gallop, no rub, and no JVD.   Abdomen:  soft, non-tender, normal bowel sounds, no distention, no masses, no guarding, no rigidity, no rebound tenderness, no abdominal hernia, no inguinal hernia, no hepatomegaly, no splenomegaly, and abdominal scar(s).   Rectal:  No external abnormalities noted. Normal sphincter tone. No rectal masses or tenderness. Msk:  normal ROM, no joint tenderness, no joint swelling, no joint warmth, no redness over joints, no joint deformities, no joint instability, no crepitation, and no muscle atrophy.   Pulses:  R and L carotid,radial,femoral,dorsalis pedis and posterior tibial pulses are full and equal bilaterally Extremities:  No clubbing, cyanosis, edema, or deformity noted with normal full range of motion of all joints.   Neurologic:  No cranial nerve deficits noted. Station and gait are normal. Plantar reflexes are down-going bilaterally. DTRs are symmetrical throughout. Sensory, motor and coordinative functions appear intact. Skin:  turgor normal, no rashes, no suspicious lesions, no ecchymoses, no petechiae, no purpura, no ulcerations, no edema, excessive tan, and solar damage.   Cervical Nodes:  no anterior cervical adenopathy and no posterior cervical adenopathy.   Axillary Nodes:  no R axillary adenopathy and no L axillary adenopathy.   Inguinal Nodes:  no R inguinal adenopathy and no L inguinal adenopathy.   Psych:  Cognition and judgment appear intact. Alert and cooperative with normal attention span and concentration. No apparent delusions, illusions, hallucinations Additional Exam:  EKG shows mild sinus arrhythmia but is otherwise normal.   Impression & Recommendations:  Problem # 1:  ROUTINE GENERAL MEDICAL [email or web address]   CARE FACL (ICD-V70.0) Assessment Unchanged  Orders: Venipuncture (04540) TLB-Lipid Panel (80061-LIPID) TLB-BMP (Basic Metabolic Panel-BMET) (80048-METABOL) TLB-CBC Platelet - w/Differential (85025-CBCD) TLB-Hepatic/Liver Function Pnl (80076-HEPATIC) TLB-TSH (Thyroid Stimulating Hormone) (84443-TSH) EKG w/ Interpretation (93000)  Mammogram: BI-RADS CATEGORY 2:  Benign finding(s).^MM DIGITAL DIAGNOSTIC BILAT (01/26/2010) Pap smear: NEGATIVE FOR INTRAEPITHELIAL LESIONS OR MALIGNANCY. (02/09/2009) Colonoscopy: Location:  [facility].   (07/27/2008) Bone Density: Normal (03/09/2008) Flu Vax: Fluvax 3+ (08/30/2009)   Chol: 172 (02/09/2009)   HDL: 50.4 (02/09/2009)   LDL: 108 (02/09/2009)   TG: 67 (02/09/2009) TSH: 1.52 (02/09/2009)   Next mammogram due:: 08/2009 (02/09/2009)  Discussed using sunscreen, use of alcohol, drug use, self breast exam, routine dental care, routine eye care, schedule for GYN exam, routine physical exam, seat belts, multiple vitamins, osteoporosis prevention, adequate calcium intake in diet, recommendations for immunizations, mammograms and Pap smears.  Discussed exercise and checking cholesterol.    Complete Medication List: 1)  Claritin 10 Mg Tabs (Loratadine) .... Take 1 tablet by mouth once a day as needed 2)  Centrum Tabs (Multiple vitamins-minerals) .... Take 1 tablet by mouth once a day 3)  Caltrate 600+d 600-400 Mg-unit Tabs (Calcium carbonate-vitamin d) .... Take 1 tablet by mouth once a day 4)  Xanax 0.5 Mg Tabs (Alprazolam) .... Take one by mouth at bedtime  as needed 5)  Celebrex 200 Mg Caps (Celecoxib) .... Once daily 6)  Est Estrogens-methyltest Hs 0.625-1.25 Mg Tabs (Est estrogens-methyltest) .Marland Kitchen.. 1 tab by mouth once daily 7)  Voltaren 1 % Gel (Diclofenac sodium) 8)  Tramadol Hcl 50 Mg Tabs (Tramadol hcl) .... 2-3 qd 9)  Magnesium  10)  Pepcid Complete  11)  Osteo Bi-flex  12)  Cipro 500 Mg Tab (Ciprofloxacin hcl) .... Take 1 tablet by mouth  morning and night x 10 days  Other Orders: TD Toxoids IM 7 YR + (82956) Admin 1st Vaccine (21308)  Colorectal Screening:  Current Recommendations:    Hemoccult: NEG X 1 today  PAP Screening:    Hx Cervical Dysplasia in last 5 yrs? No    3 normal PAP smears in last 5 yrs? Yes    Last PAP smear:  02/09/2009    Reviewed PAP smear recommendations:  PAP smear not due yet  Mammogram Screening:    Last Mammogram:  01/26/2010  Osteoporosis Risk Assessment:  Risk Factors for Fracture or Low Bone Density:   Race (White or Asian):     yes   Hx of Fractures:       no   FH of Osteoporosis:     no   Hx of falls:       no   Physically inactive:     no   Smoking status:       never   High alcohol use:     no   High caffeine use:     no   Low calcium/Vit. D intake:   no   Corticosteroid use:     no   Thyroid replacement:     no   Dilantin use:       no   Heparin use:       no   Thyroid disease:     no   Rheumatoid Arthritis:     no   Parathyroid disease:     no  Immunization & Chemoprophylaxis:    Tetanus vaccine: Td  (04/06/2010)    Influenza vaccine: Fluvax 3+  (08/30/2009)  Patient Instructions: 1)  Please schedule a follow-up appointment as needed. 2)  It is important that you exercise regularly at least 20 minutes 5 times a week. If you develop chest pain, have severe difficulty breathing, or feel very tired , stop exercising immediately and seek medical attention. 3)  You need to lose weight. Consider a lower calorie diet and regular exercise.  4)  Schedule your mammogram. Prescriptions: XANAX 0.5 MG  TABS (ALPRAZOLAM) Take one by mouth at bedtime as needed  #30 x 3   Entered and Authorized by:   Etta Grandchild MD   Signed by:   Etta Grandchild MD on 04/06/2010   Method used:   Print then Give to Patient   RxID:   [id number] CIPRO 500 MG TAB (CIPROFLOXACIN HCL) Take 1 tablet by mouth morning and night X 10 days  #20 x 0   Entered and Authorized by:   Etta Grandchild MD   Signed by:   Etta Grandchild MD on 04/06/2010   Method used:   Print then Give to Patient   RxID:   [id number]    Immunizations Administered:  Tetanus Vaccine:    Vaccine Type: Td    Site: right deltoid    Mfr: Sanofi Pasteur    Dose: 0.5 ml    Route: IM    Given by:  Alvy Beal Archie CMA    Exp. Date: 12/15/2011    Lot #: [id number]    VIS given: 10/20/07 version given Apr 06, 2010.

## 2011-01-01 NOTE — Letter (Signed)
Summary: [facility]   Imported By: Sherian Rein 01/01/2010 12:21:05  _____________________________________________________________________  External Attachment:    Type:   Image     Comment:   External Document

## 2011-01-01 NOTE — Letter (Signed)
Summary: [facility]   Imported By: Sherian Rein 01/25/2010 10:59:21  _____________________________________________________________________  External Attachment:    Type:   Image     Comment:   External Document

## 2011-01-03 ENCOUNTER — Telehealth: Payer: Self-pay | Admitting: Internal Medicine

## 2011-01-03 NOTE — Letter (Signed)
Summary: CMN Order / [facility]  CMN Order / [facility]   Imported By: Lennie Odor 12/11/2010 14:07:11  _____________________________________________________________________  External Attachment:    Type:   Image     Comment:   External Document

## 2011-01-04 NOTE — Letter (Signed)
Summary: [facility]  [facility]   Imported By: Sherian Rein 12/15/2009 07:54:22  _____________________________________________________________________  External Attachment:    Type:   Image     Comment:   External Document

## 2011-01-09 NOTE — Progress Notes (Signed)
Summary: ELEVATED BP   Phone Note From Other Clinic   Caller: [facility] Physical Therapist [phone] Summary of Call: [facility] Therapist called: Pt has just had left total shoulder replacement. Bp has been 170 to 200's over 80 x 2 days. Please advise.  Initial call taken by: Lamar Sprinkles, CMA,  January 03, 2011 11:40 AM  Follow-up for Phone Call        Returned call to [facility]// [email or web address] for therapist to call back. Patient notified per MD..Marland KitchenAlvy Beal Archie CMA  January 03, 2011 1:34 PM     New/Updated Medications: LOSARTAN POTASSIUM 50 MG TABS (LOSARTAN POTASSIUM) One by mouth once daily for high blood pressure Prescriptions: LOSARTAN POTASSIUM 50 MG TABS (LOSARTAN POTASSIUM) One by mouth once daily for high blood pressure  #30 x 11   Entered by:   Rock Nephew CMA   Authorized by:   Etta Grandchild MD   Signed by:   Rock Nephew CMA on 01/03/2011   Method used:   Faxed to ...       Hospital doctor (retail)       [address]       [place], [place]  [address]       Ph: [phone] or [phone]       Fax: [phone]   RxID:   [id number] POTASSIUM 50 MG TABS (LOSARTAN POTASSIUM) One by mouth once daily for high blood pressure  #30 x 11   Entered and Authorized by:   Etta Grandchild MD   Signed by:   Etta Grandchild MD on 01/03/2011   Method used:   Historical   RxID:   [id number]

## 2011-01-14 ENCOUNTER — Encounter: Payer: Self-pay | Admitting: Internal Medicine

## 2011-01-14 ENCOUNTER — Encounter (INDEPENDENT_AMBULATORY_CARE_PROVIDER_SITE_OTHER): Payer: Self-pay | Admitting: *Deleted

## 2011-01-14 ENCOUNTER — Other Ambulatory Visit: Payer: Medicare Other

## 2011-01-14 ENCOUNTER — Other Ambulatory Visit: Payer: Self-pay | Admitting: Internal Medicine

## 2011-01-14 ENCOUNTER — Ambulatory Visit: Payer: Medicare Other | Attending: Orthopedic Surgery | Admitting: Physical Therapy

## 2011-01-14 ENCOUNTER — Ambulatory Visit (INDEPENDENT_AMBULATORY_CARE_PROVIDER_SITE_OTHER): Payer: Medicare Other | Admitting: Internal Medicine

## 2011-01-14 DIAGNOSIS — I10 Essential (primary) hypertension: Secondary | ICD-10-CM

## 2011-01-14 DIAGNOSIS — IMO0001 Reserved for inherently not codable concepts without codable children: Secondary | ICD-10-CM | POA: Insufficient documentation

## 2011-01-14 DIAGNOSIS — R5381 Other malaise: Secondary | ICD-10-CM | POA: Insufficient documentation

## 2011-01-14 DIAGNOSIS — M25619 Stiffness of unspecified shoulder, not elsewhere classified: Secondary | ICD-10-CM | POA: Insufficient documentation

## 2011-01-14 DIAGNOSIS — M25519 Pain in unspecified shoulder: Secondary | ICD-10-CM | POA: Insufficient documentation

## 2011-01-14 DIAGNOSIS — Z79899 Other long term (current) drug therapy: Secondary | ICD-10-CM

## 2011-01-14 DIAGNOSIS — D510 Vitamin B12 deficiency anemia due to intrinsic factor deficiency: Secondary | ICD-10-CM | POA: Insufficient documentation

## 2011-01-14 DIAGNOSIS — D649 Anemia, unspecified: Secondary | ICD-10-CM

## 2011-01-14 LAB — HEPATIC FUNCTION PANEL
ALT: 18 U/L (ref 0–35)
AST: 19 U/L (ref 0–37)
Albumin: 3.6 g/dL (ref 3.5–5.2)
Total Protein: 6.2 g/dL (ref 6.0–8.3)

## 2011-01-14 LAB — CBC WITH DIFFERENTIAL/PLATELET
Basophils Relative: 0.6 % (ref 0.0–3.0)
Eosinophils Relative: 3.8 % (ref 0.0–5.0)
HCT: 35.5 % — ABNORMAL LOW (ref 36.0–46.0)
Hemoglobin: 12.2 g/dL (ref 12.0–15.0)
Lymphs Abs: 2.2 10*3/uL (ref 0.7–4.0)
MCV: 98.9 fl (ref 78.0–100.0)
Monocytes Relative: 10.3 % (ref 3.0–12.0)
Neutro Abs: 2.7 10*3/uL (ref 1.4–7.7)
WBC: 5.7 10*3/uL (ref 4.5–10.5)

## 2011-01-14 LAB — TSH: TSH: 1.84 u[IU]/mL (ref 0.35–5.50)

## 2011-01-14 LAB — BASIC METABOLIC PANEL
Chloride: 99 mEq/L (ref 96–112)
GFR: 88.39 mL/min (ref >60.00)
Glucose, Bld: 89 mg/dL (ref 70–99)
Potassium: 4.1 mEq/L (ref 3.5–5.1)
Sodium: 140 mEq/L (ref 135–145)

## 2011-01-14 LAB — IBC PANEL: Transferrin: 261.8 mg/dL (ref 212.0–360.0)

## 2011-01-16 ENCOUNTER — Ambulatory Visit: Payer: Medicare Other | Admitting: Physical Therapy

## 2011-01-18 ENCOUNTER — Ambulatory Visit: Payer: Medicare Other | Admitting: Physical Therapy

## 2011-01-21 ENCOUNTER — Ambulatory Visit: Payer: Medicare Other | Admitting: Physical Therapy

## 2011-01-21 ENCOUNTER — Telehealth: Payer: Self-pay | Admitting: Internal Medicine

## 2011-01-23 ENCOUNTER — Ambulatory Visit: Payer: Medicare Other | Admitting: Physical Therapy

## 2011-01-23 NOTE — Assessment & Plan Note (Signed)
Vital Signs:  Patient profile:   70 year old female Height:      60 inches Weight:      184 pounds BMI:     36.06 O2 Sat:      97 % on Room air Temp:     98.5 degrees F oral Pulse rate:   60 / minute Pulse rhythm:   regular Resp:     16 per minute BP sitting:   132 / 70  (left arm) Cuff size:   large  Vitals Entered By: Rock Nephew CMA (January 14, 2011 2:22 PM)  Nutrition Counseling: Patient's BMI is greater than 25 and therefore counseled on weight management options.  O2 Flow:  Room air CC: Patient here to diiscuss BP, Hypertension Management, Abdominal Pain Is Patient Diabetic? No Pain Assessment Patient in pain? no       Does patient need assistance? Functional Status Self care Ambulation Normal   Primary Care Provider:  Etta Grandchild MD  CC:  Patient here to diiscuss BP, Hypertension Management, and Abdominal Pain.  History of Present Illness: She returns for f/up and since I last saw her she has had a left shoulder replacement that was complicated by blood loss/anemia and high blood pressure. She is feeling much better.  Dyspepsia History:      She has no alarm features of dyspepsia including no history of melena, hematochezia, dysphagia, persistent vomiting, or involuntary weight loss > 5%.  There is a prior history of GERD.  The patient does not have a prior history of documented ulcer disease.  The dominant symptom is heartburn or acid reflux.  An H-2 blocker medication is not currently being taken.  A prior EGD has been done which showed moderate or severe esophagitis.    Hypertension History:      She denies headache, chest pain, palpitations, dyspnea with exertion, orthopnea, PND, peripheral edema, visual symptoms, neurologic problems, syncope, and side effects from treatment.  She notes no problems with any antihypertensive medication side effects.        Positive major cardiovascular risk factors include female age 25 years old or older and  hypertension.  Negative major cardiovascular risk factors include no history of diabetes or hyperlipidemia, negative family history for ischemic heart disease, and non-tobacco-user status.        Further assessment for target organ damage reveals no history of ASHD, cardiac end-organ damage (CHF/LVH), stroke/TIA, peripheral vascular disease, renal insufficiency, or hypertensive retinopathy.     Preventive Screening-Counseling & Management  Alcohol-Tobacco     Alcohol drinks/day: 0     Alcohol Counseling: not indicated; patient does not drink     Smoking Status: never     Passive Smoke Exposure: no     Tobacco Counseling: not indicated; no tobacco use  Hep-HIV-STD-Contraception     Hepatitis Risk: no risk noted     HIV Risk: no     STD Risk: no risk noted      Drug Use:  no.    Clinical Review Panels:  Prevention   Last Mammogram:  BI-RADS CATEGORY 2:  Benign finding(s).^MM DIGITAL DIAGNOSTIC BILAT (01/26/2010)   Last Pap Smear:  NEGATIVE FOR INTRAEPITHELIAL LESIONS OR MALIGNANCY. (02/09/2009)   Last Colonoscopy:  Location:  [facility].  (07/27/2008)  Immunizations   Last Tetanus Booster:  Td (04/06/2010)   Last Flu Vaccine:  Fluvax 3+ (09/18/2010)  Lipid Management   Cholesterol:  180 (04/06/2010)   LDL (bad choesterol):  104 (04/06/2010)  HDL (good cholesterol):  56.60 (04/06/2010)  Diabetes Management   Creatinine:  0.7 (04/06/2010)   Last Flu Vaccine:  Fluvax 3+ (09/18/2010)  CBC   WBC:  5.0 (04/06/2010)   RBC:  4.36 (04/06/2010)   Hgb:  14.5 (04/06/2010)   Hct:  41.9 (04/06/2010)   Platelets:  177.0 (04/06/2010)   MCV  96.0 (04/06/2010)   MCHC  34.7 (04/06/2010)   RDW  12.8 (04/06/2010)   PMN:  51.3 (04/06/2010)   Lymphs:  35.2 (04/06/2010)   Monos:  9.9 (04/06/2010)   Eosinophils:  2.9 (04/06/2010)   Basophil:  0.7 (04/06/2010)  Complete Metabolic Panel   Glucose:  87 (04/06/2010)   Sodium:  141 (04/06/2010)   Potassium:  4.3 (04/06/2010)    Chloride:  101 (04/06/2010)   CO2:  31 (04/06/2010)   BUN:  15 (04/06/2010)   Creatinine:  0.7 (04/06/2010)   Albumin:  4.2 (04/06/2010)   Total Protein:  6.8 (04/06/2010)   Calcium:  9.6 (04/06/2010)   Total Bili:  0.9 (04/06/2010)   Alk Phos:  85 (04/06/2010)   SGPT (ALT):  26 (04/06/2010)   SGOT (AST):  27 (04/06/2010)   Medications Prior to Update: 1)  Claritin 10 Mg  Tabs (Loratadine) .... Take 1 Tablet By Mouth Once A Day As Needed 2)  Centrum   Tabs (Multiple Vitamins-Minerals) .... Take 1 Tablet By Mouth Once A Day 3)  Caltrate 600+d 600-400 Mg-Unit  Tabs (Calcium Carbonate-Vitamin D) .... Take 1 Tablet By Mouth Once A Day 4)  Xanax 0.5 Mg  Tabs (Alprazolam) .... Take One By Mouth At Bedtime As Needed 5)  Celebrex 200 Mg Caps (Celecoxib) .... Once Daily 6)  Est Estrogens-Methyltest Hs 0.625-1.25 Mg Tabs (Est Estrogens-Methyltest) .Marland Kitchen.. 1 Tab By Mouth Once Daily 7)  Voltaren 1 % Gel (Diclofenac Sodium) 8)  Tramadol Hcl 50 Mg Tabs (Tramadol Hcl) .... 2-3 Qd 9)  Magnesium 10)  Pepcid Complete 11)  Osteo Bi-Flex 12)  Losartan Potassium 50 Mg Tabs (Losartan Potassium) .... One By Mouth Once Daily For High Blood Pressure  Current Medications (verified): 1)  Claritin 10 Mg  Tabs (Loratadine) .... Take 1 Tablet By Mouth Once A Day As Needed 2)  Centrum   Tabs (Multiple Vitamins-Minerals) .... Take 1 Tablet By Mouth Once A Day 3)  Caltrate 600+d 600-400 Mg-Unit  Tabs (Calcium Carbonate-Vitamin D) .... Take 1 Tablet By Mouth Once A Day 4)  Xanax 0.5 Mg  Tabs (Alprazolam) .... Take One By Mouth At Bedtime As Needed 5)  Celebrex 200 Mg Caps (Celecoxib) .... Once Daily 6)  Est Estrogens-Methyltest Hs 0.625-1.25 Mg Tabs (Est Estrogens-Methyltest) .Marland Kitchen.. 1 Tab By Mouth Once Daily 7)  Voltaren 1 % Gel (Diclofenac Sodium) 8)  Tramadol Hcl 50 Mg Tabs (Tramadol Hcl) .... 2-3 Qd 9)  Magnesium 10)  Pepcid Complete 11)  Osteo Bi-Flex 12)  Losartan Potassium 50 Mg Tabs (Losartan Potassium) ....  One By Mouth Once Daily For High Blood Pressure  Allergies (verified): 1)  ! Naprosyn 2)  ! Sulfa 3)  ! Neosporin 4)  ! * Ivp  Dye  Past History:  Past Surgical History: Last updated: 10/31/2008 Hysterectomy Vaginal Sling sinus surgery  shoulder arthroscopy- left  Family History: Last updated: 10/31/2008 No FH of Colon Cancer: Family History of Ovarian Cancer: Sister Family History of Prostate Cancer: Father Family History of Diabetes: Sister  Social History: Last updated: 10/31/2008 Married, 1 boy Retired Patient has never smoked.  Alcohol Use - no Illicit Drug Use -  no Patient gets regular exercise.  Risk Factors: Alcohol Use: 0 (01/14/2011) Caffeine Use: 1 (07/21/2008) Exercise: yes (10/31/2008)  Risk Factors: Smoking Status: never (01/14/2011) Passive Smoke Exposure: no (01/14/2011)  Past Medical History: Current Problems:  UTI'S, HX OF (ICD-V13.00) Hx of PNEUMONIA (ICD-486) ARTHRITIS (ICD-716.90) GERD (ICD-530.81) OBSTRUCTIVE SLEEP APNEA (ICD-327.23) Urinary incontinence Anemia-NOS Hypertension  Family History: Reviewed history from 10/31/2008 and no changes required. No FH of Colon Cancer: Family History of Ovarian Cancer: Sister Family History of Prostate Cancer: Father Family History of Diabetes: Sister  Social History: Reviewed history from 10/31/2008 and no changes required. Married, 1 boy Retired Patient has never smoked.  Alcohol Use - no Illicit Drug Use - no Patient gets regular exercise. Hepatitis Risk:  no risk noted STD Risk:  no risk noted  Review of Systems  The patient denies anorexia, fever, weight loss, weight gain, chest pain, syncope, dyspnea on exertion, peripheral edema, prolonged cough, headaches, hemoptysis, abdominal pain, hematuria, suspicious skin lesions, difficulty walking, depression, unusual weight change, abnormal bleeding, enlarged lymph nodes, and angioedema.   Endo:  Complains of cold intolerance; denies  excessive hunger, excessive thirst, excessive urination, heat intolerance, polyuria, and weight change. Heme:  Denies abnormal bruising, bleeding, enlarge lymph nodes, fevers, pallor, and skin discoloration.  Physical Exam  General:  alert, well-developed, well-nourished, well-hydrated, appropriate dress, normal appearance, healthy-appearing, cooperative to examination, and good hygiene.   Head:  normocephalic, atraumatic, no abnormalities observed, and no abnormalities palpated.   Mouth:  Oral mucosa and oropharynx without lesions or exudates.  Teeth in good repair. Neck:  supple, full ROM, no masses, no thyromegaly, no thyroid nodules or tenderness, no JVD, normal carotid upstroke, no carotid bruits, no cervical lymphadenopathy, and no neck tenderness.   Lungs:  normal respiratory effort, no intercostal retractions, no accessory muscle use, normal breath sounds, no dullness, no fremitus, no crackles, and no wheezes.   Heart:  normal rate, regular rhythm, no murmur, no gallop, no rub, and no JVD.   Abdomen:  soft, non-tender, normal bowel sounds, no distention, no masses, no guarding, no rigidity, no rebound tenderness, no abdominal hernia, no inguinal hernia, no hepatomegaly, no splenomegaly, and abdominal scar(s).   Msk:  LUE is in a sling Pulses:  R and L carotid,radial,femoral,dorsalis pedis and posterior tibial pulses are full and equal bilaterally Extremities:  No clubbing, cyanosis, edema, or deformity noted with normal full range of motion of all joints.   Neurologic:  No cranial nerve deficits noted. Station and gait are normal. Plantar reflexes are down-going bilaterally. DTRs are symmetrical throughout. Sensory, motor and coordinative functions appear intact. Skin:  Intact without suspicious lesions or rashes Psych:  Cognition and judgment appear intact. Alert and cooperative with normal attention span and concentration. No apparent delusions, illusions, hallucinations   Impression &  Recommendations:  Problem # 1:  HYPERTENSION (ICD-401.9) Assessment New  Her updated medication list for this problem includes:    Losartan Potassium 50 Mg Tabs (Losartan potassium) ..... One by mouth once daily for high blood pressure  Orders: Venipuncture (53664) TLB-BMP (Basic Metabolic Panel-BMET) (80048-METABOL) TLB-CBC Platelet - w/Differential (85025-CBCD) TLB-Hepatic/Liver Function Pnl (80076-HEPATIC) TLB-TSH (Thyroid Stimulating Hormone) (84443-TSH) TLB-B12 + Folate Pnl (40347_42595-G38/VFI) TLB-IBC Pnl (Iron/FE;Transferrin) (83550-IBC)  BP today: 132/70 Prior BP: 128/84 (04/06/2010)  10 Yr Risk Heart Disease: 9 %  Labs Reviewed: K+: 4.3 (04/06/2010) Creat: : 0.7 (04/06/2010)   Chol: 180 (04/06/2010)   HDL: 56.60 (04/06/2010)   LDL: 104 (04/06/2010)   TG: 97.0 (04/06/2010)  Problem #  2:  ANEMIA-NOS (ICD-285.9) Assessment: New  Orders: Venipuncture (16109) TLB-BMP (Basic Metabolic Panel-BMET) (80048-METABOL) TLB-CBC Platelet - w/Differential (85025-CBCD) TLB-Hepatic/Liver Function Pnl (80076-HEPATIC) TLB-TSH (Thyroid Stimulating Hormone) (84443-TSH) TLB-B12 + Folate Pnl (60454_09811-B14/NWG) TLB-IBC Pnl (Iron/FE;Transferrin) (83550-IBC)  Problem # 3:  GERD (ICD-530.81) Assessment: Unchanged  Complete Medication List: 1)  Claritin 10 Mg Tabs (Loratadine) .... Take 1 tablet by mouth once a day as needed 2)  Centrum Tabs (Multiple vitamins-minerals) .... Take 1 tablet by mouth once a day 3)  Caltrate 600+d 600-400 Mg-unit Tabs (Calcium carbonate-vitamin d) .... Take 1 tablet by mouth once a day 4)  Xanax 0.5 Mg Tabs (Alprazolam) .... Take one by mouth at bedtime as needed 5)  Celebrex 200 Mg Caps (Celecoxib) .... Once daily 6)  Est Estrogens-methyltest Hs 0.625-1.25 Mg Tabs (Est estrogens-methyltest) .Marland Kitchen.. 1 tab by mouth once daily 7)  Voltaren 1 % Gel (Diclofenac sodium) 8)  Tramadol Hcl 50 Mg Tabs (Tramadol hcl) .... 2-3 qd 9)  Magnesium  10)  Pepcid Complete    11)  Osteo Bi-flex  12)  Losartan Potassium 50 Mg Tabs (Losartan potassium) .... One by mouth once daily for high blood pressure  Dyspepsia Assessment/Plan:  Step Therapy: GERD Treatment Protocols:    Step-1: improved  Hypertension Assessment/Plan:      The patient's hypertensive risk group is category B: At least one risk factor (excluding diabetes) with no target organ damage.  Her calculated 10 year risk of coronary heart disease is 9 %.  Today's blood pressure is 132/70.     Patient Instructions: 1)  Please schedule a follow-up appointment in 2 months. 2)  It is important that you exercise regularly at least 20 minutes 5 times a week. If you develop chest pain, have severe difficulty breathing, or feel very tired , stop exercising immediately and seek medical attention. 3)  You need to lose weight. Consider a lower calorie diet and regular exercise.  4)  Check your Blood Pressure regularly. If it is above 140/90: you should make an appointment.   Orders Added: 1)  Venipuncture [36415] 2)  TLB-BMP (Basic Metabolic Panel-BMET) [80048-METABOL] 3)  TLB-CBC Platelet - w/Differential [85025-CBCD] 4)  TLB-Hepatic/Liver Function Pnl [80076-HEPATIC] 5)  TLB-TSH (Thyroid Stimulating Hormone) [84443-TSH] 6)  TLB-B12 + Folate Pnl [82746_82607-B12/FOL] 7)  TLB-IBC Pnl (Iron/FE;Transferrin) [83550-IBC] 8)  Est. Patient Level IV [95621]

## 2011-01-25 ENCOUNTER — Ambulatory Visit: Payer: Medicare Other | Admitting: Physical Therapy

## 2011-01-28 ENCOUNTER — Ambulatory Visit: Payer: Medicare Other | Admitting: Physical Therapy

## 2011-01-29 NOTE — Progress Notes (Signed)
Summary: RESULTS  Phone Note Call from Patient Call back at Home Phone [phone]   Summary of Call: Patient is requesting results of labs.  Initial call taken by: Lamar Sprinkles, CMA,  January 21, 2011 12:38 PM  Follow-up for Phone Call        they look real good Follow-up by: Etta Grandchild MD,  January 21, 2011 12:41 PM  Additional Follow-up for Phone Call Additional follow up Details #1::        left vm on pt's hm # Additional Follow-up by: Lamar Sprinkles, CMA,  January 21, 2011 2:49 PM     Appended Document: RESULTS Pt informed

## 2011-01-30 ENCOUNTER — Ambulatory Visit: Payer: Medicare Other | Admitting: Physical Therapy

## 2011-02-01 ENCOUNTER — Ambulatory Visit: Payer: Medicare Other | Attending: Orthopedic Surgery | Admitting: Physical Therapy

## 2011-02-01 DIAGNOSIS — IMO0001 Reserved for inherently not codable concepts without codable children: Secondary | ICD-10-CM | POA: Insufficient documentation

## 2011-02-01 DIAGNOSIS — M25619 Stiffness of unspecified shoulder, not elsewhere classified: Secondary | ICD-10-CM | POA: Insufficient documentation

## 2011-02-01 DIAGNOSIS — R5381 Other malaise: Secondary | ICD-10-CM | POA: Insufficient documentation

## 2011-02-01 DIAGNOSIS — M25519 Pain in unspecified shoulder: Secondary | ICD-10-CM | POA: Insufficient documentation

## 2011-02-04 ENCOUNTER — Ambulatory Visit: Payer: Medicare Other | Admitting: Physical Therapy

## 2011-02-06 ENCOUNTER — Ambulatory Visit: Payer: Medicare Other | Admitting: Physical Therapy

## 2011-02-08 ENCOUNTER — Ambulatory Visit: Payer: Medicare Other | Admitting: Physical Therapy

## 2011-02-11 ENCOUNTER — Ambulatory Visit: Payer: Medicare Other | Admitting: Physical Therapy

## 2011-02-13 ENCOUNTER — Ambulatory Visit: Payer: Medicare Other | Admitting: Physical Therapy

## 2011-02-15 ENCOUNTER — Ambulatory Visit: Payer: Medicare Other | Admitting: Physical Therapy

## 2011-02-18 ENCOUNTER — Ambulatory Visit: Payer: Medicare Other | Admitting: Physical Therapy

## 2011-02-20 ENCOUNTER — Ambulatory Visit: Payer: Medicare Other | Admitting: Physical Therapy

## 2011-02-22 ENCOUNTER — Ambulatory Visit: Payer: Medicare Other | Admitting: Physical Therapy

## 2011-02-25 ENCOUNTER — Ambulatory Visit: Payer: Medicare Other | Admitting: Physical Therapy

## 2011-02-27 ENCOUNTER — Ambulatory Visit: Payer: Medicare Other | Admitting: Physical Therapy

## 2011-03-01 ENCOUNTER — Ambulatory Visit: Payer: Medicare Other | Admitting: *Deleted

## 2011-03-05 ENCOUNTER — Ambulatory Visit: Payer: Medicare Other | Attending: Orthopedic Surgery | Admitting: Physical Therapy

## 2011-03-05 DIAGNOSIS — R5381 Other malaise: Secondary | ICD-10-CM | POA: Insufficient documentation

## 2011-03-05 DIAGNOSIS — M25519 Pain in unspecified shoulder: Secondary | ICD-10-CM | POA: Insufficient documentation

## 2011-03-05 DIAGNOSIS — M25619 Stiffness of unspecified shoulder, not elsewhere classified: Secondary | ICD-10-CM | POA: Insufficient documentation

## 2011-03-05 DIAGNOSIS — IMO0001 Reserved for inherently not codable concepts without codable children: Secondary | ICD-10-CM | POA: Insufficient documentation

## 2011-03-07 ENCOUNTER — Ambulatory Visit: Payer: Medicare Other | Admitting: Physical Therapy

## 2011-03-11 ENCOUNTER — Ambulatory Visit: Payer: Medicare Other | Admitting: Physical Therapy

## 2011-03-13 ENCOUNTER — Ambulatory Visit: Payer: Medicare Other | Admitting: Physical Therapy

## 2011-03-15 ENCOUNTER — Ambulatory Visit: Payer: Medicare Other | Admitting: Physical Therapy

## 2011-03-18 ENCOUNTER — Ambulatory Visit: Payer: Medicare Other | Admitting: Physical Therapy

## 2011-03-20 ENCOUNTER — Ambulatory Visit: Payer: Medicare Other | Admitting: Physical Therapy

## 2011-03-22 ENCOUNTER — Ambulatory Visit: Payer: Medicare Other | Admitting: Physical Therapy

## 2011-03-26 ENCOUNTER — Ambulatory Visit: Payer: Medicare Other | Admitting: Physical Therapy

## 2011-03-28 ENCOUNTER — Ambulatory Visit: Payer: Medicare Other | Admitting: Physical Therapy

## 2011-04-01 ENCOUNTER — Ambulatory Visit: Payer: Medicare Other | Admitting: Physical Therapy

## 2011-04-03 ENCOUNTER — Ambulatory Visit: Payer: Medicare Other | Attending: Orthopedic Surgery | Admitting: Physical Therapy

## 2011-04-03 DIAGNOSIS — IMO0001 Reserved for inherently not codable concepts without codable children: Secondary | ICD-10-CM | POA: Insufficient documentation

## 2011-04-03 DIAGNOSIS — M25519 Pain in unspecified shoulder: Secondary | ICD-10-CM | POA: Insufficient documentation

## 2011-04-03 DIAGNOSIS — R5381 Other malaise: Secondary | ICD-10-CM | POA: Insufficient documentation

## 2011-04-03 DIAGNOSIS — M25619 Stiffness of unspecified shoulder, not elsewhere classified: Secondary | ICD-10-CM | POA: Insufficient documentation

## 2011-04-05 ENCOUNTER — Ambulatory Visit: Payer: Medicare Other | Admitting: Physical Therapy

## 2011-04-09 ENCOUNTER — Ambulatory Visit: Payer: Medicare Other | Admitting: *Deleted

## 2011-04-11 ENCOUNTER — Ambulatory Visit: Payer: Medicare Other | Admitting: *Deleted

## 2011-04-16 ENCOUNTER — Ambulatory Visit: Payer: Medicare Other | Admitting: Physical Therapy

## 2011-04-16 NOTE — Op Note (Signed)
Veronica Garza, Veronica Garza               ACCOUNT NO.:  [id number]   MEDICAL RECORD NO.:  [id number]          PATIENT TYPE:  AMB   LOCATION:  [facility]                          FACILITY:  [facility]   PHYSICIAN:  Robert A. Thurston Hole, M.D. DATE OF BIRTH:  1941-11-04   DATE OF PROCEDURE:  09/08/2007  DATE OF DISCHARGE:                               OPERATIVE REPORT   PREOPERATIVE DIAGNOSES:  1. Left shoulder loose bodies.  2. Left shoulder partial rotator cuff tear, partial labrum tear,      partial biceps tendon tear.  3. Left shoulder degenerative joint disease.  4. Left shoulder impingement.  5. Left shoulder acromioclavicular joint degenerative joint disease      and spurring.   POSTOPERATIVE DIAGNOSES:  1. Left shoulder loose bodies.  2. Left shoulder partial rotator cuff tear, partial labrum tear,      partial biceps tendon tear.  3. Left shoulder degenerative joint disease.  4. Left shoulder impingement.  5. Left shoulder acromioclavicular joint degenerative joint disease      and spurring.   PROCEDURES:  1. Left shoulder evaluation under anesthesia followed by an      arthroscopic debridement and removal of loose bodies.  2. Left shoulder arthroscopic debridement, partial rotator cuff tear,      partial labrum tear and partial biceps tendon tear.  3. Left shoulder subacromial decompression.  4. Left shoulder distal clavicle excision.   SURGEON:  Elana Alm. Thurston Hole, M.D.   ASSISTANT:  Julien Girt, P.A.   ANESTHESIA:  General.   OPERATIVE TIME:  45 minutes.   COMPLICATIONS:  None.   INDICATIONS FOR PROCEDURE:  Ms. Hantz is a 70 year old woman who has had  6 to 8 months of increasing left shoulder pain with exam and x-rays  documenting loose bodies, DJD, partial rotator cuff tear with  impingement.  She has failed conservative care and is now to undergo  arthroscopy.   DESCRIPTION:  Ms. Drakeford was brought in the operating room on September 08, 2007 after an interscalene block was  placed in the holding room by  anesthesia.  She was placed on the operative table in the supine  position.  After being placed under general anesthesia, her left  shoulder was examined.  Initial range of motion showed forward flexion  of 160, abduction of 150, internal and external rotation of 60 degrees.  The shoulder with stable ligamentous exam.  She was then placed in the  beach-chair position and her shoulder and arm were prepped using sterile  DuraPrep and draped using sterile technique.  Originally through a  posterior arthroscopic portal, the arthroscope with a pump attached was  placed in through an anterior portal and arthroscopic probe was placed.  On initial inspection, the articular cartilage in the glenohumeral joint  showed 75% grade 4 DJD and chondromalacia and the rest grade 3 changes  which was debrided.  She had 3 loose bodies in the joint.  Each of these  were removed, the largest measuring 1 x 1.5 cm, the other measuring 1 x  1 and 1 x 1 cm.  She had partial  tearing of the anterior labrum,  superior labrum and posterior labrum 25-30%, which was debrided.  The  anterior-inferior labrum and anterior-inferior glenohumeral ligament  complex was intact.  Biceps tendon anchor was intact.  Biceps tendon  showed partial tearing of 25%, which was debrided.  The rotator cuff  showed partial tearing of 30-40% in the supraspinatus and infraspinatus  and this was debrided but it was still well attached.  The rest of the  rotator cuff was intact.  The inferior capsular recess showed  significant synovitis, which was partially debrided and cauterized,  otherwise, it was free of pathology.  After this was done, the  subacromial space was entered and a lateral arthroscopic portal was  made.  Moderately thickened bursitis was resected.  The rotator cuff was  intact on the bursal surface.  There was moderate impingement noted.  She had undergone a decompression 10 years ago but had  developed some  mild recurrent spurring and a 3 to 4 mm of the undersurface of the  interior, anterolateral and anteromedial acromion was resected.  AC  joint showed significant spurring and degenerative changes and the  distal 4 to 5 mm of clavicle was resected with a 6-mm bur.  After this  was done, the shoulder could be brought through a satisfactory range of  motion with no impingement on the rotator cuff.   At this point, it was felt that all pathology had been satisfactorily  addressed.  The instruments were removed.  Portals were closed with 3-0  nylon suture.  Sterile dressings and a sling applied and the patient  awakened and taken to the recovery room in stable condition.   FOLLOW-UP CARE:  Ms. Moncrief will be followed as an outpatient on Percocet  and Robaxin with early physical therapy.  See her back in the office in  a week for sutures out and followup.      Robert A. Thurston Hole, M.D.  Electronically Signed     RAW/MEDQ  D:  09/08/2007  T:  09/08/2007  Job:  [id number]

## 2011-04-18 ENCOUNTER — Ambulatory Visit: Payer: Medicare Other | Admitting: Physical Therapy

## 2011-04-19 NOTE — Op Note (Signed)
NAMECANAAN, PRUE NO.:  [id number]   MEDICAL RECORD NO.:  [id number]          PATIENT TYPE:  AMB   LOCATION:  [facility]                         [facility]ACILITY:  [facility]   PHYSICIAN:  Jamison Neighbor, M.D.  DATE OF BIRTH:  August 31, 1941   DATE OF PROCEDURE:  01/15/2005  DATE OF DISCHARGE:                                 OPERATIVE REPORT   PREOPERATIVE DIAGNOSIS:  Stress urinary incontinence.   POSTOPERATIVE DIAGNOSIS:  Stress urinary incontinence.   PROCEDURE:  Cystoscopy and transobturator tape sling.   SURGEON:  Jamison Neighbor, M.D.   ANESTHESIA:  General.   COMPLICATIONS:  None.   DRAINS:  24 French Foley catheter removed at the end of the procedure.   INDICATIONS FOR PROCEDURE:  This 70 year old female has well-documented  stress urinary incontinence.  The patient underwent a pubovaginal sling  using mesh and initially had excellent results.  At the time of the surgery  when the needle was passed on the left-hand side, it was seen to enter the  bladder, but this was successfully moved to a more lateral position and  clearly was seen to no longer be present within the bladder.  The patient  recovered nicely and did quite well for some time.   The patient subsequently developed some problems with pain on that left-hand  side, although she consistently had clear urine.  There was never any  hematuria or problems with infection.  She had minimal preexisting urgency  which did not get worse with time, and her stress urinary incontinence  remained well-controlled.  Because she had persistent pain on that side,  however, we performed a cystoscopic examination and found that mesh had  eroded its way into the bladder.  Normally one would feel that this was  probably a bad placement at the time of initial surgery, but since the  needle had been specifically moved to a more lateral position and  repositioned at the time of surgery, it was felt that it was more likely  that this had eroded over time due to perhaps excessive tension on the  sling.  The patient had this removed using an endoscopic technique.  The  sling was cut on the left-hand side and grasped, and a large section was  removed.  It is felt that there is probably some residual sling material on  the right-hand side, but all of the sling on the left-hand side had been  removed.  The area healed over completely, and the patient recovered.   Unfortunately, her stress urinary incontinence returned.  She is interested  in having something further done about the stress urinary incontinence.  She  has been given numerous options for therapy, including the possibility of  doing an open procedure such as a Burch verus injections versus different  forms of sling.  She would like to have a sling performed, and we discussed  in great detail the various options, including a repeat of the similar  procedure she had done before, the use of a pubovaginal sling with fascia,  or using a transobturator sling.  The patient  has had a very difficult time  making a decision.  She had some worries about the use of mesh but had  bigger worries about the use of fascia.  She liked the idea of having done  exactly what she had done before but has great worries that she would have a  similar type of erosion.  The patient and I talked about this on three or  four occasions and discussed this in great detail.  Decision was finally  made to use a mesh sling because of her fear of possible contamination from  the fascia and to use a transobturator approach.  The patient is to undergo  placement of a Psychologist, forensic.  She understands the  risks and benefits of the procedure, including erosion, infection, over or  under correction, and possible need for revision and/or removal of the  sling, as well as the fact that there is no guarantee that she will have  complete correction and no guarantee that her  urge incontinence will not  still be present.  The patient gave full informed consent.   DESCRIPTION OF PROCEDURE:  After successful induction of general anesthesia,  the patient was placed in the dorsal lithotomy position, prepped with  Betadine, and draped in the usual sterile fashion.   Careful bimanual examination revealed some urethral immobility, but it was  otherwise normal.  The anterior vaginal mucosa was infiltrated directly over  the urethra.  An incision was then made.  Small flaps just large enough to  place a finger were made, extending back towards but not to the endopelvic  fascia.  A small incision was made in the groin crease at the proximal level  of the clitoris.  The curved needle was then passed from the small groin  incision through the obturator space and out through the vaginal incision.  This was done on both sides.  The cystoscopic examination with 12 and 70-  degree lenses showed no evidence of any injury to the bladder, and the water  that came out of the bladder ran clear.  The remainder of the cystoscopic  examination was unremarkable.  The ureters were normal.  The bladder mucosa  was unremarkable, and no tumors, stones, or other abnormalities could be  detected.  The sling was then positioned.  The needles were used to pull the  sling up to the groin incisions.  The blue tab was positioned in the midline  so that a pair of scissors could be placed between the urethra and the  sling.  The blue tab was cut, and the protective sheaths were removed.  The  sling sat in a nice flat position at the mid-urethra.  The area was  irrigated and then closed with a running suture of 2-0 Vicryl.   The patient tolerated the procedure well and was taken to the recovery room  in good condition.      RJE/MEDQ  D:  01/15/2005  T:  01/15/2005  Job:  [id number]

## 2011-04-19 NOTE — Op Note (Signed)
NAMEDEBAR, PLATE NO.:  [id number]   MEDICAL RECORD NO.:  [id number]          PATIENT TYPE:  AMB   LOCATION:  SDS                          FACILITY:  [facility]   PHYSICIAN:  Hermelinda Medicus, M.D.   DATE OF BIRTH:  1941/03/06   DATE OF PROCEDURE:  DATE OF DISCHARGE:                               OPERATIVE REPORT   PREOPERATIVE DIAGNOSES:  1. Bilateral ethmoid and maxillary sinusitis with history of septal      reconstruction with turbinate hypertrophy, with sleep apnea.  2. History of pneumonia and bronchitis times 2 in the last two years.   POSTOPERATIVE DIAGNOSES:  1. Bilateral ethmoid and maxillary sinusitis with history of septal      reconstruction with turbinate hypertrophy, with sleep apnea.  2. History of pneumonia and bronchitis times 2 in the last two years.   OPERATION:  Bilateral ethmoidectomy and maxillary sinus ostial  enlargement with antrostomies with reduction of turbinates.   SURGEON:  Hermelinda Medicus, M.D.   ANESTHESIA:  Local monitored anesthesia care with Dr. Sondra Come.   PROCEDURE:  The patient was placed in the supine position and under  local MAC anesthesia, using 1% Xylocaine with epinephrine about 7 mL and  topical cocaine 200 mg, the nose was examined carefully, and after  anesthesia was completed, the left ethmoid sinus was approached, pushing  the middle turbinate medial which was pushed way over lateral over the  sinus and we pushed this medial and then entered the sinus, finding some  thick mucopurulent material and polypoid debris and this was removed  using the straight and upbiting Blakesley-Wilde's as well as the 0-  degree scope.  Once this was completed, we could see to the sinus and it  appeared to be adequately opened.  The left maxillary sinus was then  approached where the natural osteum was essentially a pin hole and fluid  was within the sinus and we found the natural osteum using the curved  suction and then used the  side-biting forceps to increase it  approximately 5 times it normal size and take down some of the thickened  membrane in that sinus.  Once we did this, we also did an antrostomy to  ensure we got all of the mucus and debris out of that sinus.  Once that  was completed, we then approached the right side where the right ethmoid  was approached after pushing the middle turbinate again medial and then  the ethmoid sinus was opened.  Again thick mucus and polypoid debris was  removed.  Again, the natural ostium of the maxillary sinus was found  using the curve suction by palpation.  Once this was found, the side-  biting forceps was used to increase this to approximately 5 times its  normal size, making the sinus drain adequately and letting us suction  this area clearly.  The antrostomy was then carried out to again suction  the mucus from this sinus.  Once this was complete, we then placed  Gelfoam in the ethmoid sinus and placed Gelfilm to hold the middle  turbinates medial and then  we placed anesthesia trumpets after using the  Elmed bipolar cautery set at 12 to cauterize the lateral aspect of the  inferior turbinates.  We also outfractured the inferior turbinates as  much as possible to gain as much space as possible.  We will remove  these anesthesia trumpets this afternoon so that she can use her CPAP  tonight.  The CPAP was set at 13, we had it set down to 8 for hopefully  more efficient utilization.  This was done by Dr. Teddy Spike office.   The patient tolerated the procedure very well, will be discharged as 23-  hour observation.  She is aware that she cannot do any heavy lifting,  cannot blow her nose for at least a week and should not do any  underwater swimming.  She will be on antibiotics and on pain medication  and we will follow her in 5 days, then 10 days, then 3 weeks, 6 weeks, 3  months, 6 months and a year.           ______________________________  Hermelinda Medicus,  M.D.     JC/MEDQ  D:  03/25/2007  T:  03/25/2007  Job:  [id number]   cc:   Barbaraann Share, MD,FCCP  Reuben Likes, M.D.

## 2011-04-19 NOTE — Procedures (Signed)
NAMEMICHON, KACZMAREK NO.:  [id number]   MEDICAL RECORD NO.:  [id number]          PATIENT TYPE:  OUT   LOCATION:  SLEEP CENTER                 FACILITY:  [facility]   PHYSICIAN:  Clinton D. Maple Hudson, MD, FCCP, FACPDATE OF BIRTH:  August 23, 1941   DATE OF STUDY:  07/20/2006                              NOCTURNAL POLYSOMNOGRAM   REFERRING PHYSICIAN:  Dr. Leslee Home   INDICATION FOR STUDY:  Hypersomnia with sleep apnea.   EPWORTH SLEEPINESS SCORE:  13/24.   BMI:  27.3.   WEIGHT:  170 pounds.   HOME MEDICATION:  Blood pressure pill.   SLEEP ARCHITECTURE:  Short total sleep time 177 minutes with sleep  efficiency 45%.  Stage I was 13%, stage II 87%, stages III and IV were REM  were absent.  Sleep latency 182 minutes with initial sleep onset at 135 and  sustained sleep only after 2 a.m.  Awake after sleep onset 32 minutes,  arousal index increased at 58 indicating markedly increased sleep  fragmentation.  She estimated needing 4 hours for sleep onset and sleeping 2  hours saying sleep quality was worse than usual but not offering any  explanation.  Once asleep, she was able to remain asleep and her problems  seem to be with initiating sleep in the first place.  No bedtime medication  was reported.   RESPIRATORY DATA:  Apnea/hypopnea index (AHI, RDI) 63.9 obstructive events  per hour indicating severe obstructive sleep apnea/hypopnea syndrome.  This  included 136, obstructive apneas and 53 hypopneas.  Most sleep and all  events were recorded while on her right side.  There was insufficient sleep  to permit use of CPAP titration by protocol on this study night.   OXYGEN DATA:  Very loud snoring with oxygen desaturation to a nadir of 81%.  Mean oxygen saturation through the study was 93% on room air.   CARDIAC DATA:  Normal sinus rhythm with heart rate 55-81 beats per minute.   MOVEMENTS-PARASOMNIA:  No significant limb jerks or unusual movement.  Bathroom x1.   IMPRESSION/RECOMMENDATIONS:  1. Difficulty initiating sleep with a short sleep time and increased sleep      fragmentation, absent slow wave and REM sleep on this study night.  2. Severe obstructive sleep apnea/hypopnea syndrome, AHI 63.9 per hour      with all events while sleeping on right side, making positional therapy      unlikely to help.  Very loud snoring with oxygen desaturation to a      nadir of 81%.  3. Consider return for CPAP titration.  It would likely help if she were      to bring a sleep medication on return if this approach is chosen.      Otherwise consider for alternative therapies as appropriate.      Clinton D. Maple Hudson, MD, [facility], FACP  Diplomate, Biomedical engineer of Sleep Medicine  Electronically Signed     CDY/MEDQ  D:  07/26/2006 14:02:07  T:  07/27/2006 22:17:46  Job:  [id number]

## 2011-04-19 NOTE — Op Note (Signed)
Veronica Garza, RESTIVO NO.:  [id number]   MEDICAL RECORD NO.:  [id number]          PATIENT TYPE:  AMB   LOCATION:  DSC                          FACILITY:  [facility]   PHYSICIAN:  Cindee Salt, M.D.       DATE OF BIRTH:  08-11-1941   DATE OF PROCEDURE:  07/02/2006  DATE OF DISCHARGE:                                 OPERATIVE REPORT   PREOPERATIVE DIAGNOSIS:  Mucoid cyst, left middle finger.   POSTOPERATIVE DIAGNOSIS:  Mucoid cyst, left middle finger.   OPERATION:  Excision mucoid cyst, debridement of distal phalangeal joint  left middle finger.   SURGEON:  Cindee Salt, M.D.   ASSISTANT:  Carolyne Fiscal, R.N.   ANESTHESIA:  Forearm-based IV regional.   HISTORY:  The patient is a 70 year old female with a history of mass at the  PIP joint left middle finger.  She is desirous of removal.  X-rays revealed  degenerative changes.  She is aware of risks and complications of surgery,  including infection, stiffness, recurrence, injury to arteries, nerves,  tendons, extensor lag.   PROCEDURE:  In the preoperative area, the area of surgery was marked by both  the patient and surgeon.  Questions encouraged and answered. Preoperative  antibiotic given.   The patient was brought to the operating room, where forearm-based IV  regional anesthetic was carried out without difficulty.  She was prepped  using DuraPrep, supine position, left arm free. A curvilinear incision was  made over the distal interphalangeal joint, carried down on the lateral  aspect of the digit, carried down through subcutaneous tissue.  Bleeders  were electrocauterized.  The deflated cysts were immediately apparent.  Significant swelling of the joint was also present. With blunt and sharp  dissection, the cysts were dissected free and sent to pathology.  The joint  was opened on either side of the extensor tendon.  Significant synovial  tissue was present.  This was removed with a small rongeur.  Osteophytes  from  the middle phalanx were also removed with the rongeur.  No further  lesions were identified.  The wound was irrigated.  The skin was then closed  with interrupted 5-0 nylon sutures.  Sterile compressive dressing and splint  was applied to the digit. The patient tolerated the procedure well and was  taken to the recovery observation in satisfactory condition.  She is  discharged home to return to the [facility] in 1 week on  Vicodin.           ______________________________  Cindee Salt, M.D.     GK/MEDQ  D:  07/02/2006  T:  07/02/2006  Job:  [id number]

## 2011-04-19 NOTE — Procedures (Signed)
NAMECALISE, DUNCKEL NO.:  [id number]   MEDICAL RECORD NO.:  [id number]          PATIENT TYPE:  OUT   LOCATION:  SLEEP CENTER                 FACILITY:  [facility]   PHYSICIAN:  Clinton D. Maple Hudson, MD, FCCP, FACPDATE OF BIRTH:  01-30-1941   DATE OF STUDY:  08/28/2006                              NOCTURNAL POLYSOMNOGRAM   REFERRING PHYSICIAN:  Dr. Reuben Likes   INDICATION FOR STUDY:  Hypersomnia with sleep apnea.   EPWORTH SLEEPINESS SCORE:  14/24, BMI 31, weight 195 pounds.   MEDICATIONS:  Listed on the chart and reviewed.   A baseline diagnostic NPSG on July 20, 2006 recorded an AHI of 63.9 per  hour.  CPAP titration is requested.   SLEEP ARCHITECTURE:  Total sleep time 340 minutes with sleep efficiency 87%.  Stage I was 4%, stage II 78%, stages III and IV were absent, around 17% of  total sleep time.  Sleep latency 24 minutes, REM latency 109 minutes, awake  after sleep onset 27 minutes, arousal index 2.4.  No bedtime medication was  reported.   RESPIRATORY DATA:  CPAP titration protocol.  Obstructive events were  prevented at all pressures.  CPAP was titrated to 13 CWP to stop snoring and  was well tolerated.  A medium comfort gel mask was used with heated  humidity.   OXYGEN DATA:  Snoring was prevented and oxygen saturation held at 95% on  CPAP.   CARDIAC DATA:  Sinus rhythm.   MOVEMENT/PARASOMNIA:  Occasional limb jerk, insignificant.   IMPRESSION/RECOMMENDATIONS:  1. Successful CPAP titration to 13 CWP, AHI 0 per hour.  Lower pressures      also prevented apneas but allowed  breakthrough snoring.  A medium      comfort gel mask was used with heated humidifier.  2. Baseline diagnostic NPSG in 2007 had recorded an AHI of 63.9 per hour.     Clinton D. Maple Hudson, MD, [facility], FACP  Diplomate, Biomedical engineer of Sleep Medicine  Electronically Signed    CDY/MEDQ  D:  09/06/2006 11:26:35  T:  09/08/2006 02:03:32  Job:  [id number]

## 2011-04-19 NOTE — Assessment & Plan Note (Signed)
[facility]                             PULMONARY OFFICE NOTE   AYANNI, TUN                        MRN:          [id number]  DATE:11/03/2006                            DOB:          1941/02/25    SLEEP MEDICINE FOLLOWUP   SUBJECTIVE:  Ms. Shed comes in today after initiating CPAP at her last  visit for severe obstructive sleep apnea.  The patient states that she  is much improved since being on the CPAP with no diaphoresis or  nightmares noted at this time.  She has also noted significant decrease  in nocturia.  She feels more rested and has increased alertness during  the day.  She is still having occasional mask leaks, which concerns me,  because this will worsen at higher pressures.   PHYSICAL EXAM:  BP 154/86, pulse 76, temperature 97.9, weight 211  pounds. O2 saturation on room air is 94%.  There is no skin breakdown or pressure necrosis from the CPAP mask.   IMPRESSION:  Severe obstructive sleep apnea, which is responding quite  nicely to continuous positive airway pressure.  We now need to get her  pressure up to her optimal level, and also work on mask fitting.   PLAN:  1. Check mask fit with her DME company.  2. Nasacort 2 sprays in each naris daily for her complaints of chronic      nasal congestion.  3. Increase CPAP to 13 centimeters of water pressure.  4. Work on weight loss.  5. The patient will follow up in 6 months or sooner if there are      problems.  I have asked her to call me if she has difficulties at      the higher pressure.     Barbaraann Share, MD,FCCP  Electronically Signed    KMC/MedQ  DD: 11/04/2006  DT: 11/04/2006  Job #: [id number]   cc:   Reuben Likes, M.D.

## 2011-04-19 NOTE — H&P (Signed)
NAMEVERGIA, Veronica Garza NO.:  [id number]   MEDICAL RECORD NO.:  [id number]          PATIENT TYPE:  AMB   LOCATION:  SDS                          FACILITY:  [facility]   PHYSICIAN:  Hermelinda Medicus, M.D.   DATE OF BIRTH:  05-21-1941   DATE OF ADMISSION:  03/25/2007  DATE OF DISCHARGE:                              HISTORY & PHYSICAL   HISTORY OF PRESENT ILLNESS:  This patient is a 70 year old female who  has had a history of obstructive sleep apnea in the past and is on CPAP.  She also has a history of sinusitis in the past and has had multiple  antibiotics used on several occasions.  She has more recently been on  Augmentin, Levaquin, Augmentin once a again.  She has had pneumonia and  bronchitis.  She has had two episodes since the beginning of 2008.  In  June of 2007, she had antibiotic and then had bronchitis and pneumonia  and in 2006, she has been on Biaxin so she has had bronchitis in October  and November associated with sinusitis problem and had pneumonia x2 over  the past two years.  We feel that the sinus problems have been  aggravating this problem and probably is also aggravating her sleep  apnea issues and; therefore, CT scan was obtained by Dr. Marcelyn Bruins  and did show bilateral fluid levels in the maxillary sinus and also  showed ethmoid sinusitis of moderate proportion.  She also has had a  recent chest x-ray which shows no airspace opacities, no effusion or  edema, no active disease.  This was just done on March 20, 2007.  It is  therefore considered that correcting the sinus issues may very well help  the bronchitis and pneumonia issues as well as improve her CPAP  performance and her sleep apnea status.  Her CPAP was set at 13, but now  it has been set back to 8 and her sleep apnea workup revealed a BMI of  31.  Her weight is 195.  Sleepiness score was 14/24.  She had an AHI of  63.9 and her titration at 13 took her AHI down to zero.  She also had an  O2 nadir starting at 95 going down to 81%.  She had normal sinus rhythms  at 55 to 81 beats per minute.  She now enters for sinus surgery where we  would open the natural ostia of the bilateral maxillary and the ethmoid  sinuses and reduce the turbinates to see if we can get some little  better results on her sleep apnea status.   PAST HISTORY:  1. She has a surgery and a cyst on the middle finger.  2. Hysterectomy.  3. The septoplasty several years ago.  4. Left shoulder surgery.  5. Bladder tack in 2006.  6. She has had a previous stress EKG and echo which is within normal      limits.  7. She had a hiatal hernia of her reflux and takes medication for      that.   ALLERGIES:  SHE HAS ALLERGIES TO  SULFUR, NAPROSYN AND NEOSPORIN AND THIS  CAUSES A RASH.   SOCIAL HISTORY:  She does not smoke and does not drink.   PHYSICAL EXAMINATION:  GENERAL:  Her lab work looks excellent and well  within normal limits and she is mildly overweight.  VITAL SIGNS:  Her blood pressure is 148/89, pulse 98, respiration 18.  Weight 209 pounds.  HEENT:  The ears are clear.  The tympanic membranes are clear.  The nose  septum is not deviated, it looks straight.  The turbinates are moderate  in size.  The nasopharynx is clear.  The oral cavity is clear of any  ulceration or mass, and the larynx is clear of any ulceration or mass  with true cords, false cords, epiglottis, face and tongue are clear.  __________ , gag reflex, __________ , EOMs, facial nerve are all  symmetrical as her shoulder strength.  CHEST:  Clear.  No rales, rhonchi or wheezes.  CARDIOVASCULAR:  No murmurs, rubs or gallops.  EXTREMITIES:  Unremarkable.   DIAGNOSES:  SHE DOES HAVE AN ALLERGY HISTORY IN HER FAMILY and therefore  her diagnoses are:  1. Sleep apnea on CPAP.  2. History of septal reconstruction and the septum is straight.  3. Bilateral maxillary and ethmoid sinusitis.  4. History of hysterectomy, left shoulder surgery  and middle finger      drainage surgery cyst and a bladder tack.  5. History of bronchitis and pneumonia.   Her medications are listed on the chart.           ______________________________  Hermelinda Medicus, M.D.     JC/MEDQ  D:  03/25/2007  T:  03/25/2007  Job:  [id number]   cc:   Reuben Likes, M.D.  Barbaraann Share, MD,FCCP

## 2011-04-19 NOTE — Op Note (Signed)
NAMEARDENE, REMLEY NO.:  [id number]   MEDICAL RECORD NO.:  [id number]                   PATIENT TYPE:  AMB   LOCATION:  NESC                                 FACILITY:  [facility]   PHYSICIAN:  Jamison Neighbor, M.D.               DATE OF BIRTH:  August 26, 1941   DATE OF PROCEDURE:  05/29/2004  DATE OF DISCHARGE:                                 OPERATIVE REPORT   SERVICE:  Urology.   PREOPERATIVE DIAGNOSES:  Stress urinary incontinence.   POSTOPERATIVE DIAGNOSES:  Stress urinary incontinence.   PROCEDURE:  Cystoscopy and TVT pubovaginal sling.   SURGEON:  Jamison Neighbor, M.D.   ANESTHESIA:  General.   COMPLICATIONS:  None.   DRAINS:  16 French Foley catheter.   BRIEF HISTORY:  This 70 year old female has had long standing problems with  urgency and pain felt to be consistent with interstitial cystitis. The  patient also developed significant stress incontinence. 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 The patient has a pipe stem urethra on evaluation without  lots of urethral hypermobility. The patient is going to have a sling placed.  She understands that this is being done solely for stress incontinence and  will not affect her urge incontinence. She knows that she will likely  require long-term therapy for the urgency incontinence with Vesicare and  that there is not likely to be any change in her urgency with the procedure.  She does know that if she cannot empty her bladder well and has to do  abdominal straining she may need to do self catheterization.  She said she  would do this if we could do this if we could stop the leakage for her. The  patient gave full informed consent.   DESCRIPTION OF PROCEDURE:  After successful induction of  general anesthesia,  the patient was placed in the dorsal lithotomy position, prepped with  Betadine and draped in the usual sterile fashion. Careful bimanual  examination showed that she did not have a lot of urethral hypermobility.  She does have a small rectocele, it does not really prolapse and a little  bit of bulging at the posterior aspect of the vagina then maybe a small  enterocele though it is clearly not problematic for the patient. The  weighted vaginal speculum was placed and the labial stitches were utilized  to pull the labia out towards the thigh to improve exposure. The anterior  vaginal mucosa was infiltrated, a small incision was made directly over the  mid urethra.  Small flaps of vaginal mucosa were raised bilaterally allowing  entry into the paravaginal tissue. Dissection proceeded posterior back to  but not through the endopelvic fascia. Two incisions  were then made three  fingerbreadths apart just over the pubic bone. The needle guide was passed  down on each side from the top and down to the vaginal incision.  On the  left hand side, the needle appeared to catch the edge of the bladder so that  it was repositioned in a better position. The cystoscope was then used to  carefully evaluate the bladder, it was free of any tumor or stones. Both  ureteral orifices were normal in configuration and location. The bladder  neck was somewhat patulous. The 12 degree and 70 degree lenses were utilized  and it was clear that there was no needle anywhere within the bladder at  this point.  The sling was then pushed up into position, additional  cystoscopy demonstrated good placement at the mid urethra. The area was  irrigated, the sling was then tensioned appropriately so that a pair of  scissors could be placed between the sling and the underside of the urethra.  The protective sheath for the sling was pulled away setting it in  appropriate place. The incision was then closed  with a running suture of 2-0  Vicryl. The area was irrigated before and after closure. The sling was then  cut off at the level of the skin, Dermabond and Steri-Strips were used to  close the skin. The patient was left with a Foley catheter because of her  history of difficulty emptying as well as her problems with incontinence.  She will return next week for a formal voiding trial.                                               Jamison Neighbor, M.D.    RJE/MEDQ  D:  05/29/2004  T:  05/29/2004  Job:  [id number]

## 2011-04-19 NOTE — Op Note (Signed)
Veronica Garza, HEPP NO.:  [id number]   MEDICAL RECORD NO.:  [id number]                   PATIENT TYPE:  AMB   LOCATION:  ENDO                                 FACILITY:  [facility]   PHYSICIAN:  Anselmo Rod, M.D.               DATE OF BIRTH:  1941/02/26   DATE OF PROCEDURE:  05/13/2003  DATE OF DISCHARGE:                                 OPERATIVE REPORT   PROCEDURE PERFORMED:  Screening colonoscopy.   ENDOSCOPIST:  Anselmo Rod, M.D.   INSTRUMENT USED:  Olympus video colonoscope.   INDICATION FOR PROCEDURE:  Recent history of BRBPR with preparation for the  colonoscopy and a previous history of guaiac-positive stools on a physical  exam in a 70 year old white female with a history of sleep apnea.  Rule out  colonic polyps, masses, etc.   PREPROCEDURE PREPARATION:  Informed consent was procured from the patient.  The patient was fasted for eight hours prior to the procedure and prepped  with a bottle of magnesium citrate and a gallon of GoLYTELY the night prior  to the procedure.   PREPROCEDURE PHYSICAL:  VITAL SIGNS:  The patient had stable vital signs.  NECK:  Supple.  CHEST:  Clear to auscultation.  S1, S2 regular.  ABDOMEN:  Soft with normal bowel sounds.   DESCRIPTION OF PROCEDURE:  The patient was placed in the left lateral  decubitus position and sedated with 80 mg of Demerol and 8 mg of Versed  intravenously.  Once the patient was adequately sedate and maintained on low-  flow oxygen and continuous cardiac monitoring, the Olympus video colonoscope  was advanced from the rectum to the midsigmoid colon with difficulty.  There  was some difficulty negotiating the scope through, and therefore the adult  scope was withdrawn and the pediatric adjustable scope used instead.  With  gentle maneuvering, the procedure was completed up to the cecum and the  appendiceal orifice and the ileocecal valve were clearly visualized and  photographed.   There was some residual stool in the colon.  Multiple washes  were done.  External hemorrhoids were noted but no other abnormalities were  noted on examination of the colon.  There was no evidence of internal  hemorrhoids, masses, polyps, erosions, ulcerations, or diverticulosis.  The  patient tolerated the procedure well without complications.   IMPRESSION:  1. Essentially normal colonoscopy except for small external hemorrhoids.  2. Some residual stool in the colon.  Small lesions could have been missed.  3. No evidence of diverticulosis, masses, or polyps.   RECOMMENDATIONS:  1. A high-fiber diet with liberal fluid intake has been recommended.  2.     Repeat CRC screening in the next five years unless the patient develops any      abnormal symptoms in the interim.  3. Repeat guaiacs on a yearly basis as before and additional workup if  stools continue to be guaiac-positive.  4. Outpatient follow-up on a p.r.n. basis.                                               Anselmo Rod, M.D.    JNM/MEDQ  D:  05/13/2003  T:  05/14/2003  Job:  [id number]   cc:   Reuben Likes, M.D.  [address]  [place]  [place] [address]  Fax: [phone]

## 2011-04-19 NOTE — Op Note (Signed)
Veronica Garza, Veronica Garza NO.:  [id number]   MEDICAL RECORD NO.:  [id number]                   PATIENT TYPE:  AMB   LOCATION:  DAY                                  FACILITY:  [facility]   PHYSICIAN:  Jamison Neighbor, M.D.               DATE OF BIRTH:  15-May-1941   DATE OF PROCEDURE:  08/17/2004  DATE OF DISCHARGE:                                 OPERATIVE REPORT   PREOPERATIVE DIAGNOSIS:  Eroded SPARC pubovaginal sling.   POSTOPERATIVE DIAGNOSIS:  Eroded SPARC pubovaginal sling.   PROCEDURE:  Cystoscopy, excision of eroded pubovaginal sling.   SURGEON:  Jamison Neighbor, M.D.   ANESTHESIA:  General.   COMPLICATIONS:  None.   DRAINS:  45 French Foley catheter.   BRIEF HISTORY:  This 70 year old female had a SPARC pubovaginal sling  inserted, the patient developed some postoperative pain and underwent  cystoscopy where we saw an erosion of the [facility] sling on the left hand side.  The original operative note was reported and it was noted at the time of the  surgery that a needle had passed through the left hand side, but the needle  was repositioned and appropriately determined that it had not passed through  the bladder.  I suspect that the patient developed a small postoperative  urinoma despite Foley catheter drainage and eventually developed the delayed  erosion causing the pain that occurred several weeks out.  The patient has  had an excellent response as far as incontinence is concerned and somewhat  surprising is the fact that her urinalysis has been crystal clear with no  red cells, white cells, or bacteruria noted.  Despite that, she is having  some pain on the left hand side and when this erosion was noted, it was felt  it should be excised.  The decision was made to attempt to do this  endoscopically and hopefully, the patient will not lose her good continence.  The alternative would be to either perform an open procedure with both  intravesical  and extravesical dissection or possibly transvaginal approach  with forceful removal of the sling from below.  The patient realizes that  she may require additional surgery.  She understands the risks and benefits  of the procedure and particularly knows that she might develop postoperative  incontinence requiring additional surgery.  Full informed consent was  obtained.   DESCRIPTION OF PROCEDURE:  After successful induction of general anesthesia,  the patient was placed in the dorsal lithotomy position  and prepped with  Betadine and draped in the usual sterile fashion.  Cystoscopy was performed,  the bladder was carefully inspected, it was free of tumor or stones.  Both  ureteral orifices were normal in configuration and location.  The sling was  identified.  It was cut with endoscopic scissors and a piece was identified.  This was grasped with graspers and actually pulled out  through the meatus in  order to allow for removal of that device with that portion which was also  cut off flush at the wall of the bladder.  The cystoscope was reinserted and  it could be seen that there were seen additional fibers that were stuck at  the area where it had been cut.  These were resected with a resectoscope  until nearly all the visible fibers had been removed.  The dissection did go  down below the level of the mucosa and it was felt that if there were any  little stray fibers of sling that these would epithelize and cover over.  The bladder was intact.  There was no evidence of any urinary leakage.  The  urethra appeared unremarkable.  The support appeared to be reasonable,  although of course, the patient may develop postoperative loss of urine.  It  should be noted that the bladder was left somewhat full and with a Crede  maneuver, urine did come through but without, it seemed not to leak.  Hopefully, this will indicate appropriate tension has been maintained.  The  patient should have a  catheter in for several days to allow the urethra to  have a chance to heal because the patient was instrumented multiple times  and in order to allow the epithelium to heal over.  She will be sent home  with a leg bag, Lorcet Plus, Pyridium Plus, and Cipro, and return to see me  next week.                                               Jamison Neighbor, M.D.    RJE/MEDQ  D:  08/17/2004  T:  08/17/2004  Job:  [id number]

## 2011-04-19 NOTE — Assessment & Plan Note (Signed)
[facility]                             PULMONARY OFFICE NOTE   Veronica Garza, Veronica Garza                        MRN:          [id number]  DATE:10/01/2006                            DOB:          01-16-1941    HISTORY OF PRESENT ILLNESS:  The patient is a very pleasant 70 year old  woman who I have been asked to see for obstructive sleep apnea.  The  patient recently underwent nocturnal polysomnography in August 2007  where she was found to have a respiratory disturbance index of 64 events  per hour.  She then underwent CPAP titration on August 28, 2006,  where she was found to have good control of her obstructive apneas with  13 cm of water pressure.  The patient states that she typically goes to  bed 11:30 p.m. and gets up at 7 a.m.  She is not rested upon arising.  She has been noted to have snoring as well as pauses in her breathing  during sleep.  The patient states during the day she has significant  excessive daytime sleepiness in that she falls asleep while reading and  sometimes with having conversations with friends.  She does have sleep  pressure with driving.  Of note, her weight is up about 10-15 pounds  over the last 2 years.   PAST MEDICAL HISTORY:  Significant for:  1. Allergic rhinitis.  2. History of hysterectomy.  3. History of bladder tacking procedure.   MEDICATIONS:  1. Detrol LA 4 mg daily.  2. Syntest H.S. one daily.  3. Diclofenac 75 mg b.i.d.  4. Various vitamins.   The patient has intolerance or allergy to NAPROSYN and SULFA.   SOCIAL HISTORY:  The patient is married and has never smoked.  She does  have children.   FAMILY HISTORY:  Remarkable for her sister having asthma and allergies  and father having prostate cancer and sister having uterine cancer.   REVIEW OF SYSTEMS:  As per history of present illness.  Also, see  patient intake form documented in the chart.   PHYSICAL EXAMINATION:  GENERAL:  She is an  overweight female in no acute  distress.  VITAL SIGNS:  Blood pressure is 142/94, pulse 71, temperature is 97.8,  weight is 208 pounds, O2 saturation on room air is 94%.  HEENT:  Pupils equal, round, and reactive to light and accommodation.  Extraocular muscles are intact.  Nares show mild septal deviation to the  left.  Oropharynx shows normal uvula and palate.  NECK:  Supple without JVD or lymphadenopathy.  There is no palpable  thyromegaly.  CHEST:  Totally clear.  CARDIAC:  Reveals regular rate and rhythm without murmurs, rubs or  gallops.  ABDOMEN:  Soft, nontender, with good bowel sounds.  GENITAL, RECTAL, BREAST:  Not done and not indicated.  LOWER EXTREMITIES:  Show varicosities and trace to 1+ edema bilaterally.  NEUROLOGIC:  She is alert and oriented and moves all four extremities.   IMPRESSION:  Severe obstructive sleep apnea documented by nocturnal  polysomnography along with significant daytime symptomatology.  I had a  long discussion with her about the pathophysiology of sleep apnea  including the short-term quality of life issues and the long-term  cardiovascular issues.  Currently, I think CPAP coupled with weight loss  would be in her best interest and she is agreeable to this.   PLAN:  1. Initiate CPAP at 10 cm.  2. Work on weight loss.  3. The patient to follow up in 4 weeks or sooner if there are      problems.  She will ultimately need pressure optimization.     Barbaraann Share, MD,FCCP  Electronically Signed    [facility]/Med[facility]  DD: 11/04/2006  DT: 11/04/2006  Job #: [id number]   cc:   Reuben Likes, M.D.

## 2011-04-30 ENCOUNTER — Ambulatory Visit: Payer: Medicare Other | Admitting: Physical Therapy

## 2011-05-02 ENCOUNTER — Ambulatory Visit: Payer: Medicare Other | Admitting: Physical Therapy

## 2011-06-19 ENCOUNTER — Ambulatory Visit (HOSPITAL_BASED_OUTPATIENT_CLINIC_OR_DEPARTMENT_OTHER)
Admission: RE | Admit: 2011-06-19 | Discharge: 2011-06-19 | Disposition: A | Discharge status: Home / Self Care | Payer: Medicare Other | Source: Ambulatory Visit | Attending: Specialist | Admitting: Specialist

## 2011-06-19 DIAGNOSIS — H269 Unspecified cataract: Secondary | ICD-10-CM | POA: Insufficient documentation

## 2011-06-28 NOTE — Op Note (Signed)
  NAMEIA, LEEB NO.:  [id number]  MEDICAL RECORD NO.:  [id number]  LOCATION:                                 FACILITY:  PHYSICIAN:  Chucky May, M.D.  DATE OF BIRTH:  [birth year]  DATE OF PROCEDURE: DATE OF DISCHARGE:                              OPERATIVE REPORT   PREOPERATIVE DIAGNOSIS:  Cataract, left eye.  POSTOPERATIVE DIAGNOSIS:  Cataract, left eye.  OPERATION PERFORMED:  Cataract extraction with intraocular lens implant, left eye.  INDICATIONS FOR SURGERY:  The patient is a 70 year old female with painless progressive decrease in vision such that she has difficulty seeing for activities of daily living, specifically reading fine print and driving at night.  On examination, she was found to have cataract consistent with decrease in visual acuity.  The procedure is best performed in the outpatient setting.  SURGEON:  Chucky May, M.D.  ANESTHESIA:  MAC.  DESCRIPTION OF PROCEDURE:  The patient was brought to the main operating room, placed in supine position.  Anesthesia was obtained by means of topical 4% lidocaine drops with tetracaine.  The patient was then prepped and draped in the usual manner.  A lid speculum was inserted and the cornea was entered temporally with a 2.4 mm keratome.  An additional port was created inferiorly with a 1 mm port.  Viscoat was instilled into the anterior chamber and an anterior capsulorrhexis was performed without difficulty.  The nucleus was then hydrodissected and mobilized using 1% nonpreserved lidocaine.  The nucleus was then phacoemulsified and residual cortical material was removed by irrigation-aspiration. The posterior capsule was polished and a posterior chamber lens implant was placed in the bag without difficulty.  Viscoelastic was removed and replaced with Balanced Salt Solution.  The wounds were hydrated with Balanced Salt Solution and checked for fluid leaks and none were noted. The  eye was dressed with topical Pred Forte and Vigamox as well as a Fox shield and the patient was taken to the recovery room in excellent condition where she received written and verbal instructions for postoperative care and is scheduled for followup in 24 hours.          ______________________________ Chucky May, M.D.    DJD/MEDQ  D:  06/19/2011  T:  06/19/2011  Job:  [id number]  Electronically Signed by Nelson Chimes M.D. on 06/28/2011 09:34:06 AM

## 2011-07-02 ENCOUNTER — Other Ambulatory Visit: Payer: Self-pay | Admitting: Internal Medicine

## 2011-07-10 ENCOUNTER — Ambulatory Visit (HOSPITAL_BASED_OUTPATIENT_CLINIC_OR_DEPARTMENT_OTHER)
Admission: RE | Admit: 2011-07-10 | Discharge: 2011-07-10 | Disposition: A | Discharge status: Home / Self Care | Payer: Medicare Other | Source: Ambulatory Visit | Attending: Specialist | Admitting: Specialist

## 2011-07-10 DIAGNOSIS — I10 Essential (primary) hypertension: Secondary | ICD-10-CM | POA: Insufficient documentation

## 2011-07-10 DIAGNOSIS — H269 Unspecified cataract: Secondary | ICD-10-CM | POA: Insufficient documentation

## 2011-07-10 DIAGNOSIS — G4733 Obstructive sleep apnea (adult) (pediatric): Secondary | ICD-10-CM | POA: Insufficient documentation

## 2011-07-10 DIAGNOSIS — Z79899 Other long term (current) drug therapy: Secondary | ICD-10-CM | POA: Insufficient documentation

## 2011-07-10 LAB — POCT I-STAT 4, (NA,K, GLUC, HGB,HCT)
Glucose, Bld: 84 mg/dL (ref 70–99)
HCT: 44 % (ref 36.0–46.0)
Hemoglobin: 15 g/dL (ref 12.0–15.0)
Potassium: 4 mEq/L (ref 3.5–5.1)

## 2011-07-25 NOTE — Op Note (Signed)
  Veronica Garza, Veronica Garza NO.:  [id number]  MEDICAL RECORD NO.:  [id number]  LOCATION:                                 FACILITY:  PHYSICIAN:  Chucky May, M.D.  DATE OF BIRTH:  [birth year]  DATE OF PROCEDURE:  07/10/2011 DATE OF DISCHARGE:                              OPERATIVE REPORT   PREOPERATIVE DIAGNOSIS:  Cataract, right eye.  POSTOPERATIVE DIAGNOSIS:  Cataract, right eye.  OPERATION PERFORMED:  Cataract extraction with intraocular lens implant, right eye.  SURGEON:  Chucky May, M.D.  ANESTHESIA:  MAC.  INDICATIONS FOR SURGERY:  The patient is a 70 year old female with painless progressive decrease in vision so she has difficulty seeing for reading and driving.  On examination she was found to have a cataract consistent with decrease in visual acuity.  The outpatient setting is the appropriate setting for the procedure.  PROCEDURE:  The patient was brought to the main operating room and placed in the supine position.  Anesthesia was obtained by means of topical 4% lidocaine drops with tetracaine.  She was then prepped and draped in the usual manner.  Lid speculum was inserted and the cornea was entered temporally with an additional port superiorly.  Viscoelastic was instilled into the anterior chamber followed by a capsulorrhexis without difficulty.  The nucleus was mobilized by hydrodissection and rotated within the capsular bag.  Hydrodissection was accomplished by 1% unpreserved lidocaine irrigation.  The nucleus was then phacoemulsified. Residual cortical material was removed by irrigation and aspiration. The posterior capsule was polished and a posterior chamber lens implant was placed in the bag without difficulty.  The viscoelastic was removed by irrigation and aspiration and replaced with balanced salt solution. The wound was hydrated with balanced salt solution and checked for fluid leaks and none were noted.  The eye was dressed  with topical Pred Forte and Vigamox with a Fox shield and the patient was taken to recovery room in excellent condition where she received written and verbal instructions for her postoperative care and was scheduled for follow-up in 24 hours.          ______________________________ Chucky May, M.D.     DJD/MEDQ  D:  07/10/2011  T:  07/11/2011  Job:  [id number]  Electronically Signed by Nelson Chimes M.D. on 07/25/2011 08:52:04 AM

## 2011-08-08 ENCOUNTER — Other Ambulatory Visit (INDEPENDENT_AMBULATORY_CARE_PROVIDER_SITE_OTHER): Payer: Medicare Other

## 2011-08-08 ENCOUNTER — Ambulatory Visit (INDEPENDENT_AMBULATORY_CARE_PROVIDER_SITE_OTHER): Payer: Medicare Other | Admitting: Internal Medicine

## 2011-08-08 ENCOUNTER — Encounter: Payer: Self-pay | Admitting: Internal Medicine

## 2011-08-08 DIAGNOSIS — I10 Essential (primary) hypertension: Secondary | ICD-10-CM

## 2011-08-08 DIAGNOSIS — D649 Anemia, unspecified: Secondary | ICD-10-CM

## 2011-08-08 DIAGNOSIS — Z1231 Encounter for screening mammogram for malignant neoplasm of breast: Secondary | ICD-10-CM | POA: Insufficient documentation

## 2011-08-08 DIAGNOSIS — K219 Gastro-esophageal reflux disease without esophagitis: Secondary | ICD-10-CM

## 2011-08-08 DIAGNOSIS — Z0000 Encounter for general adult medical examination without abnormal findings: Secondary | ICD-10-CM

## 2011-08-08 DIAGNOSIS — Z23 Encounter for immunization: Secondary | ICD-10-CM

## 2011-08-08 LAB — COMPREHENSIVE METABOLIC PANEL
AST: 19 U/L (ref 0–37)
Albumin: 4.1 g/dL (ref 3.5–5.2)
BUN: 17 mg/dL (ref 6–23)
CO2: 29 mEq/L (ref 19–32)
Calcium: 9.3 mg/dL (ref 8.4–10.5)
Chloride: 100 mEq/L (ref 96–112)
Creatinine, Ser: 0.6 mg/dL (ref 0.4–1.2)
GFR: 101.5 mL/min (ref >60.00)
Glucose, Bld: 92 mg/dL (ref 70–99)
Potassium: 4.4 mEq/L (ref 3.5–5.1)

## 2011-08-08 LAB — LIPID PANEL
Cholesterol: 188 mg/dL (ref 0–200)
LDL Cholesterol: 107 mg/dL — ABNORMAL HIGH (ref 0–99)
VLDL: 11.4 mg/dL (ref 0.0–40.0)

## 2011-08-08 LAB — CBC WITH DIFFERENTIAL/PLATELET
Basophils Relative: 0.5 % (ref 0.0–3.0)
Eosinophils Absolute: 0.1 10*3/uL (ref 0.0–0.7)
Eosinophils Relative: 2.1 % (ref 0.0–5.0)
Hemoglobin: 13.5 g/dL (ref 12.0–15.0)
Lymphocytes Relative: 34.5 % (ref 12.0–46.0)
MCHC: 33.8 g/dL (ref 30.0–36.0)
Monocytes Relative: 9.8 % (ref 3.0–12.0)
Neutro Abs: 3.1 10*3/uL (ref 1.4–7.7)
Neutrophils Relative %: 53.1 % (ref 43.0–77.0)
RBC: 4.18 Mil/uL (ref 3.87–5.11)
WBC: 5.8 10*3/uL (ref 4.5–10.5)

## 2011-08-08 LAB — TSH: TSH: 2.14 u[IU]/mL (ref 0.35–5.50)

## 2011-08-08 MED ORDER — EST ESTROGENS-METHYLTEST 0.625-1.25 MG PO TABS: 1.0000 | ORAL_TABLET | Freq: Every day | ORAL | Status: DC

## 2011-08-08 NOTE — Progress Notes (Signed)
  Subjective:    Patient ID: Veronica Garza, female    DOB: 1941/07/02, 70 y.o.   MRN: [id number]  Hypertension This is a chronic problem. The current episode started more than 1 year ago. The problem has been gradually improving since onset. The problem is controlled. Pertinent negatives include no anxiety, blurred vision, chest pain, headaches, malaise/fatigue, neck pain, orthopnea, palpitations, peripheral edema, PND, shortness of breath or sweats. There are no associated agents to hypertension. Past treatments include angiotensin blockers. The current treatment provides moderate improvement. Compliance problems include diet and exercise.   Gastrophageal Reflux She complains of heartburn. She reports no abdominal pain, no belching, no chest pain, no choking, no coughing, no dysphagia, no early satiety, no globus sensation, no hoarse voice, no nausea, no sore throat, no stridor, no tooth decay, no water brash or no wheezing. This is a recurrent problem. The current episode started more than 1 year ago. The problem occurs rarely. The problem has been gradually improving. The heartburn is located in the substernum. The heartburn is of mild intensity. The heartburn does not wake her from sleep. The heartburn does not limit her activity. The heartburn doesn't change with position. The symptoms are aggravated by nothing. Associated symptoms include fatigue. Pertinent negatives include no anemia, melena, muscle weakness, orthopnea or weight loss. She has tried a histamine-2 antagonist for the symptoms. The treatment provided significant relief. Past procedures include an EGD.      Review of Systems  Constitutional: Positive for fatigue. Negative for fever, chills, weight loss, malaise/fatigue, diaphoresis, activity change, appetite change and unexpected weight change.  HENT: Negative for sore throat, hoarse voice, facial swelling, trouble swallowing, neck pain, neck stiffness, voice change and tinnitus.     Eyes: Negative.  Negative for blurred vision.  Respiratory: Negative for apnea, cough, choking, chest tightness, shortness of breath, wheezing and stridor.   Cardiovascular: Negative for chest pain, palpitations, orthopnea, leg swelling and PND.  Gastrointestinal: Positive for heartburn. Negative for dysphagia, nausea, vomiting, abdominal pain, diarrhea, constipation, blood in stool, melena, abdominal distention, anal bleeding and rectal pain.  Musculoskeletal: Positive for arthralgias (all large joints, esp shoulders). Negative for myalgias, back pain, joint swelling, gait problem and muscle weakness.  Skin: Negative for color change, pallor, rash and wound.  Neurological: Negative for dizziness, tremors, seizures, syncope, facial asymmetry, speech difficulty, weakness, light-headedness, numbness and headaches.  Hematological: Negative for adenopathy. Does not bruise/bleed easily.  Psychiatric/Behavioral: Negative.        Objective:   Physical Exam    Lab Results  Component Value Date   WBC 5.7 01/14/2011   HGB 15.0 07/10/2011   HCT 44.0 07/10/2011   PLT 191.0 01/14/2011   CHOL 180 04/06/2010   TRIG 97.0 04/06/2010   HDL 56.60 04/06/2010   ALT 18 01/14/2011   AST 19 01/14/2011   NA 138 07/10/2011   K 4.0 07/10/2011   CL 99 01/14/2011   CREATININE 0.7 01/14/2011   BUN 13 01/14/2011   CO2 29 01/14/2011   TSH 1.84 01/14/2011   INR 0.93 12/07/2010      Assessment & Plan:

## 2011-08-08 NOTE — Assessment & Plan Note (Signed)

## 2011-08-08 NOTE — Patient Instructions (Signed)
Preventative Care for Adults - Female Studies show that half of deaths in the [place] today result from unhealthy lifestyle practices. This includes ignoring preventive care suggestions. Preventive health guidelines for women include the following key practices:  A routine yearly physical is a good way to check with your primary caregiver about your health and preventive screening. It is a chance to share any concerns and updates on your health, and to receive a thorough all-over exam.   If you smoke cigarettes, find out from your caregiver how to quit. It can literally save your life, no matter how long you have been a tobacco user. If you do not use tobacco, never start.   Maintain a healthy diet and normal weight. Increased weight leads to problems with blood pressure and diabetes. Decrease saturated fat in your diet and increase regular exercise. Eat a variety of foods, including fruit, vegetables, animal or vegetable protein (meat, fish, chicken, and eggs, or beans, lentils, and tofu), and grains, such as rice. Get information about proper diet from your caregiver, if needed.   Aerobic exercise helps maintain good heart health. The CDC and the American College of Sports Medicine recommend 30 minutes of moderate-intensity exercise (a brisk walk that increases your heart rate and breathing) on most days of the week. Ongoing high blood pressure should be treated with medicines, if weight loss and exercise are not effective.   Avoid smoking, drinking too much alcohol (more than two drinks per day), and use of street drugs. Do not share needles with anyone. Ask for professional help if you need support or instructions about stopping the use of alcohol, cigarettes, or drugs.   Maintain normal blood lipids and cholesterol, by minimizing your intake of saturated fat. Eat a well rounded diet, with plenty of fruit and vegetables. The National Institutes of Health encourage women to eat 5-9 servings of  fruit and vegetables each day. Your caregiver can give instructions to help you keep your risk of heart disease or stroke low. High blood pressure causes heart disease and increases risk of stroke. Blood pressure should be checked every 1-2 years, from age 20 onward.   Blood tests for high cholesterol, which causes heart and vessel disease, should begin at age 20 and be repeated every 5 years, if test results are normal. (Repeat tests more often if results are high.)   Diabetes screening involves taking a blood sample to check your blood sugar level, after a fasting period. This is done once every 3 years, after age 45, if test results are normal.   Breast cancer screening is essential to preventive care for women. All women age 20 and older should perform a breast self-exam every month. At age 40 and older, women should have their caregiver complete a breast exam each year. Women at ages 40-50 should have a mammogram (x-ray film) of the breasts each year. Your caregiver can discuss when to start your yearly mammograms.   Cervical cancer screening includes taking a Pap smear (sample of cells examined under a microscope) from the cervix (end of the uterus). It also includes testing for HPV (Human Papilloma Virus, which can cause cervical cancer). Screening and a pelvic exam should begin at age 21, or 3 years after a woman becomes sexually active. Screening should occur every year, with a Pap smear but no HPV testing, up to age 30. After age 30, you should have a Pap smear every 3 years with HPV testing, if no HPV was found previously.     Colon cancer can be detected, and often prevented, long before it is life threatening. Most routine colon cancer screening begins at the age of 50. On a yearly basis, your caregiver may provide easy-to-use take-home tests to check for hidden blood in the stool. Use of a small camera at the end of a tube, to directly examine the colon (sigmoidoscopy or colonoscopy), can  detect the earliest forms of colon cancer and can be life saving. Talk to your caregiver about this at age 50, when routine screening begins. (Screening is repeated every 5 years, unless early forms of pre-cancerous polyps or small growths are found.)   Practice safe sex. Use condoms. Condoms are used for birth control and to reduce the spread of sexually transmitted infections (STIs). Unsafe sex is sexual activity without the use of safeguards, such as condoms and avoidance of high-risk acts, to reduce the chances of getting or spreading STIs. STIs include gonorrhea (the clap), chlamydia, syphilis, trichimonas, herpes, HPV (human papilloma virus) and HIV (human immunodeficiency virus), which causes AIDS. Herpes, HIV, and HPV are viral illnesses that have no cure. They can result in disability, cancer, and death.   HPV causes cancer of the cervix, and other infections that can be transmitted from person to person. There is a vaccine for HPV, and females should get immunized between the ages of 11 and 26. It requires a series of 3 shots.   Osteoporosis is a disease in which the bones lose minerals and strength as we age. This can result in serious bone fractures. Risk of osteoporosis can be identified using a bone density scan. Women ages 65 and over should discuss this with their caregivers, as should women after menopause who have other risk factors. Ask your caregiver whether you should be taking a calcium supplement and Vitamin D, to reduce the rate of osteoporosis.   Menopause can be associated with physical symptoms and risks. Hormone replacement therapy is available to decrease these. You should talk to your caregiver about whether starting or continuing to take hormones is right for you.   Use sunscreen with SPF (skin protection factor) of 15 or more. Apply sunscreen liberally and repeatedly throughout the day. Being outside in the sun, when your shadow is shorter than you are, means you are being  exposed to sun at greater intensity. Lighter skinned people are at a greater risk of skin cancer. Wear sunglasses, to protect your eyes from too much damaging sunlight (which can speed up cataract formation).   Once a month, do a whole body skin exam or review, using a mirror to look at your back. Notify your caregiver of changes in moles, especially if there are changes in shapes, colors, irregular border, a size larger than a pencil eraser, or new moles develop.   Keep carbon monoxide and smoke detectors in your home, and functioning, at all times. Change the batteries every 6 months, or use a model that plugs into the wall.   Stay up to date with your tetanus shots and other required immunizations. A booster for tetanus should be given every 10 years. Be sure to get your flu shot every year, since 5%-20% of the [place] population comes down with the flu. The composition of the flu vaccine changes each year, so being vaccinated once is not enough. Get your shot in the fall, before the flu season peaks. The table below lists important vaccines to get. Other vaccines to consider include for Hepatitis A virus (to prevent a form of   infection of the liver, by a virus acquired from food), Varicella Zoster (a virus that causes shingles), and Meninogoccal (against bacteria which cause a form of meningitis).   Brush your teeth twice a day with fluoride toothpaste, and floss once a day. Good oral hygiene prevents tooth decay and gum disease, which can be painful and can cause other health problems. Visit your dentist for a routine oral and dental check up and preventive care every 6-12 months.   The Body Mass Index or BMI is a way of measuring how much of your body is fat. Having a BMI above 27 increases the risk of heart disease, diabetes, hypertension, stroke and other problems related to obesity. Your caregiver can help determine your BMI, and can develop an exercise and dietary program to help you achieve or  maintain this measurement at a healthy level.   Wear seat belts whenever you are in a vehicle, whether as passenger or driver, and even for short drives of a few minutes.   If you bicycle, wear a helmet at all times.  Preventative Care for Adult Women  Preventative Services Ages 73-39 Ages 70-64 Ages 35 and over  Health risk assessment and lifestyle counseling.     Blood pressure check.** Every 1-2 years Every 1-2 years Every 1-2 years  Total cholesterol check including HDL.** Every 5 years beginning at age 28 Every 5 years beginning at age 73, or more often if risk is high Every 5 years through age 71, then optional  Breast self exam. Monthly in all women ages 27 and older Monthly Monthly  Clinical breast exam.** Every 3 years beginning at age 48 Every year Every year  Mammogram.**  Every year beginning at age 62, optional from age 70-49 (discuss with your caregiver). Every year until age 44, then optional  Pap Smear** and HPV Screening. Every year from ages 77 through 89 Every 3 years from ages 57 through 69, if HPV is negative Optional; talk with your caregiver  Flexible sigmoidoscopy** or colonoscopy.**   Every 5 years beginning at age 37 Every 5 years until age 56; then optional  FOBT (fecal occult blood test) of stool.  Every year beginning at age 45 Every year until 53; then optional  Skin self-exam. Monthly Monthly Monthly  Tetanus-diphtheria (Td) immunization. Every 10 years Every 10 years Every 10 years  Influenza immunization.** Every year Every year Every year  HPV immunization. Once between the ages of 84 and 40     Pneumococcal immunization.** Optional Optional Every 5 years  Hepatitis B immunization.** Series of 3 immunizations  (if not done previously, usually given at 0, 1 to 2, and 4 to 6 months)  Check with your caregiver, if vaccination not previously given Check with your caregiver, if vaccination not previously given  ** Family history and personal history of risk and  conditions may change your caregiver's recommendations.  Document Released: 01/14/2002 Document Re-Released: 02/12/2010 [facility] Patient Information 2011 [place], [place].Hypertension (High Blood Pressure) As your heart beats, it forces blood through your arteries. This force is your blood pressure. If the pressure is too high, it is called hypertension (HTN) or high blood pressure. HTN is dangerous because you may have it and not know it. High blood pressure may mean that your heart has to work harder to pump blood. Your arteries may be narrow or stiff. The extra work puts you at risk for heart disease, stroke, and other problems.  Blood pressure consists of two numbers, a higher number  over a lower, 110/72, for example. It is stated as "110 over 72." The ideal is below 120 for the top number (systolic) and under 80 for the bottom (diastolic). Write down your blood pressure today. You should pay close attention to your blood pressure if you have certain conditions such as:  Heart failure.  Prior heart attack.   Diabetes   Chronic kidney disease.   Prior stroke.   Multiple risk factors for heart disease.   To see if you have HTN, your blood pressure should be measured while you are seated with your arm held at the level of the heart. It should be measured at least twice. A one-time elevated blood pressure reading (especially in the Emergency Department) does not mean that you need treatment. There may be conditions in which the blood pressure is different between your right and left arms. It is important to see your caregiver soon for a recheck. Most people have essential hypertension which means that there is not a specific cause. This type of high blood pressure may be lowered by changing lifestyle factors such as:  Stress.  Smoking.   Lack of exercise.   Excessive weight.  Drug/tobacco/alcohol use.   Eating less salt.   Most people do not have symptoms from high blood pressure until  it has caused damage to the body. Effective treatment can often prevent, delay or reduce that damage. TREATMENT Treatment for high blood pressure, when a cause has been identified, is directed at the cause. There are a large number of medications to treat HTN. These fall into several categories, and your caregiver will help you select the medicines that are best for you. Medications may have side effects. You should review side effects with your caregiver. If your blood pressure stays high after you have made lifestyle changes or started on medicines,   Your medication(s) may need to be changed.   Other problems may need to be addressed.   Be certain you understand your prescriptions, and know how and when to take your medicine.   Be sure to follow up with your caregiver within the time frame advised (usually within two weeks) to have your blood pressure rechecked and to review your medications.   If you are taking more than one medicine to lower your blood pressure, make sure you know how and at what times they should be taken. Taking two medicines at the same time can result in blood pressure that is too low.  SEEK IMMEDIATE MEDICAL CARE IF YOU DEVELOP:  A severe headache, blurred or changing vision, or confusion.   Unusual weakness or numbness, or a faint feeling.   Severe chest or abdominal pain, vomiting, or breathing problems.  MAKE SURE YOU:   Understand these instructions.   Will watch your condition.   Will get help right away if you are not doing well or get worse.  Document Released: 11/18/2005 Document Re-Released: 05/08/2010 [facility] Patient Information 2011 [place], [place].

## 2011-08-11 NOTE — Assessment & Plan Note (Signed)
Her BP is well controlled 

## 2011-08-11 NOTE — Assessment & Plan Note (Signed)
She is doing well on the H2 blocker

## 2011-08-11 NOTE — Assessment & Plan Note (Addendum)
I will recheck her CBC and vitamin levels today 

## 2011-09-06 ENCOUNTER — Encounter (INDEPENDENT_AMBULATORY_CARE_PROVIDER_SITE_OTHER): Payer: Medicare Other | Admitting: Ophthalmology

## 2011-09-06 DIAGNOSIS — H3530 Unspecified macular degeneration: Secondary | ICD-10-CM

## 2011-09-06 DIAGNOSIS — H43819 Vitreous degeneration, unspecified eye: Secondary | ICD-10-CM

## 2011-09-11 ENCOUNTER — Telehealth: Payer: Self-pay | Admitting: Gastroenterology

## 2011-09-11 NOTE — Telephone Encounter (Signed)
Pt had an appt scheduled with Dr. Arlyce Dice for 09/26/11 but she states her choking is getting worse, she is even having problems with liquids. Requesting a sooner appt. Pt scheduled to see Amy Esterwood PA 09/12/11@1 :30pm. Pt aware of appt date and time.

## 2011-09-12 ENCOUNTER — Ambulatory Visit (INDEPENDENT_AMBULATORY_CARE_PROVIDER_SITE_OTHER): Payer: Medicare Other | Admitting: Physician Assistant

## 2011-09-12 ENCOUNTER — Encounter: Payer: Self-pay | Admitting: Physician Assistant

## 2011-09-12 VITALS — BP 130/80 | HR 64 | Ht 60.0 in | Wt 188.4 lb

## 2011-09-12 DIAGNOSIS — K222 Esophageal obstruction: Secondary | ICD-10-CM

## 2011-09-12 DIAGNOSIS — R1310 Dysphagia, unspecified: Secondary | ICD-10-CM

## 2011-09-12 LAB — BASIC METABOLIC PANEL
Chloride: 99
Creatinine, Ser: 0.79
GFR calc Af Amer: >60
Potassium: 4
Sodium: 136

## 2011-09-12 LAB — POCT HEMOGLOBIN-HEMACUE
Hemoglobin: 15.1 — ABNORMAL HIGH
Operator id: 208731

## 2011-09-12 MED ORDER — ESOMEPRAZOLE MAGNESIUM 40 MG PO CPDR: 40.0000 mg | DELAYED_RELEASE_CAPSULE | Freq: Every day | ORAL | Status: DC

## 2011-09-12 NOTE — Progress Notes (Signed)
Subjective:    Patient ID: Veronica Garza, female    DOB: [birth year], 70 y.o.   MRN: [id number]  HPI Veronica Garza is a 70 year old white female known to Dr. Arlyce Dice who underwent colonoscopy in August of 2009; this was a normal exam. She also has history of chronic GERD and prior dilation of an esophageal stricture in February 2010. This was done with [facility] dilation to 18 mm. Patient comes in today with complaints of recurrent dysphagia which has been present over the past 6 months with gradual worsening. She states that the prior dilation helped a lot. At this point she is having daily symptoms and feels that almost everything including liquids  is getting "hung up". She says her food gets lodged after a couple of bites it causes her chest to ache. She will stop eating and the food will gradually go on down. She is not having any regurgitation, her weight has been stable, she has noted a lot of belching. She does not have much in the way of heartburn. She has been intolerant to several PPIs in the past with side effect of diarrhea and has been taking Pepcid 20 mg on a when necessary basis. She does also stay on Celebrex for significant arthritis symptoms.    Review of Systems  Constitutional: Negative.   HENT: Positive for trouble swallowing.   Eyes: Negative.   Respiratory: Negative.   Cardiovascular: Negative.   Gastrointestinal: Positive for diarrhea.  Genitourinary: Negative.   Musculoskeletal: Positive for back pain, joint swelling and arthralgias.  Skin: Negative.   Neurological: Negative.   Hematological: Negative.   Psychiatric/Behavioral: Negative.    Outpatient Prescriptions Prior to Visit  Medication Sig Dispense Refill  . ALPRAZolam (XANAX) 0.5 MG tablet Take 0.5 mg by mouth at bedtime as needed.        . Calcium Carbonate-Vitamin D (CALTRATE 600+D) 600-400 MG-UNIT per tablet Take 2 tablets by mouth daily.       . celecoxib (CELEBREX) 200 MG capsule Take 200 mg by mouth daily.         . diclofenac sodium (VOLTAREN) 1 % GEL Apply topically.        Marland Kitchen estrogen-methylTESTOSTERone (EST ESTROGENS-METHYLTEST HS) 0.625-1.25 MG per tablet Take 1 tablet by mouth daily.  90 tablet  3  . Famotidine (PEPCID PO) Take by mouth.        . gabapentin (NEURONTIN) 300 MG capsule Take 300 mg by mouth. Take 2 daily       . hydrocodone-acetaminophen (LORCET-HD) 5-500 MG per capsule Take 2 capsules by mouth daily.        . Hypromellose (GENTEAL OP) Apply to eye as directed.        . loratadine (CLARITIN) 10 MG tablet Take 10 mg by mouth daily.        Marland Kitchen losartan (COZAAR) 50 MG tablet Take 50 mg by mouth daily.        . Magnesium Chloride (MAGNESIUM DR PO) Take by mouth.        . Misc Natural Products (OSTEO BI-FLEX ADV JOINT SHIELD PO) Take 2 tablets by mouth daily.       Bertram Gala Glycol-Propyl Glycol (SYSTANE OP) Apply to eye as directed.        . traMADol (ULTRAM) 50 MG tablet Take 50 mg by mouth. Take 1-3 daily        Allergies  Allergen Reactions  . Iohexol      Code: HIVES, Desc: ? Contrast reaction from CT prior to  2000.   . Naproxen     REACTION: rash  . Sulfonamide Derivatives     REACTION: rash/tingling  . Triple Antibiotic     REACTION: redness       Objective:   Physical Exam Well-developed elderly white female in no acute distress, alert and oriented x3, pleasant; blood pressure 130/80 pulse 64, HEENT; nontraumatic, normocephalic, EOMI, PERRLA sclera anicteric, Neck; supple no JVD, Cardiovascular; regular rate and rhythm with S1-S2 no murmur or gallop,Pulmonary; clear bilaterally, Abdomen ;soft, nontende,r nondistended bowel sounds active no palpable mass or hepatosplenomegaly, Recta; not done, Extremities; no clubbing cyanosis or edema skin warm and dry, Psych; mood and affect normal and appropriate.        Assessment & Plan:  #40 70 year old female with previous history of distal esophageal stricture, now with recurrent dysphagia x6 months gradually progressive.  Symptoms are consistent with recurrent peptic stricture  #2 Chronic GERD  #3 Colon neoplasia screening on the patient up to date with normal colonoscopy August 2009  Plan; Schedule for upper endoscopy with [facility] dilation per Dr. Arlyce Dice. Procedure discussed in detail with the patient Advise patient to eat a very soft foods and avoid meat in the interim until dilation can be done Start trial of Nexium 40 mg by mouth every morning, if she tolerates this well she will need long-term prescription, if not have advised she continue Pepcid 20 mg twice daily chronically.

## 2011-09-12 NOTE — Patient Instructions (Signed)
We have scheduled the Endoscopy with Dr. Melvia Heaps. Directions  provided. We have given you samples of Nexium 40 mg capsules.  Take 1 capsule 30 min before breakfast daily.  If this medication gives you diarrhea stop it and Go back to the Pepcid [facility].   Upper GI Endoscopy Upper GI endoscopy means using a flexible scope to look at the esophagus, stomach and upper small bowel. This is done to make a diagnosis in people with heartburn, abdominal pain, or abnormal bleeding. Sometimes an endoscope is needed to remove foreign bodies or food that become stuck in the esophagus; it can also be used to take biopsy samples. For the best results, do not eat or drink for 8 hours before having your upper endoscopy.  To perform the endoscopy, you will probably be sedated and your throat will be numbed with a special spray. The endoscope is then slowly passed down your throat (this will not interfere with your breathing). An endoscopy exam takes 15-30 minutes to complete and there is no real pain. Patients rarely remember much about the procedure. The results of the test may take several days if a biopsy or other test is taken.  You may have a sore throat after an endoscopy exam. Serious complications are very rare. Stick to liquids and soft foods until your pain is better. You should not drive a car or operate any dangerous equipment for at least 24 hours after being sedated. SEEK IMMEDIATE MEDICAL CARE IF:  You have severe throat pain.   You have shortness of breath.   You have bleeding problems.   You have a fever.   You have difficulty recovering from your sedation.  Document Released: 12/26/2004 Document Re-Released: 02/12/2010 [facility] Patient Information 2011 [place], [place].

## 2011-09-13 ENCOUNTER — Encounter: Payer: Self-pay | Admitting: Gastroenterology

## 2011-09-13 ENCOUNTER — Ambulatory Visit (AMBULATORY_SURGERY_CENTER): Payer: Medicare Other | Admitting: Gastroenterology

## 2011-09-13 VITALS — BP 146/89 | HR 67 | Temp 97.7°F | Resp 25 | Ht 60.0 in | Wt 180.0 lb

## 2011-09-13 DIAGNOSIS — R1310 Dysphagia, unspecified: Secondary | ICD-10-CM

## 2011-09-13 DIAGNOSIS — K222 Esophageal obstruction: Secondary | ICD-10-CM

## 2011-09-13 MED ORDER — SODIUM CHLORIDE 0.9 % IV SOLN: 500.0000 mL | INTRAVENOUS | Status: DC

## 2011-09-13 NOTE — Patient Instructions (Signed)
Please follow the ESOPHAGEAL DILATION DIET as follows:  -Nothing by mouth until 1230pm  -Clear Liquids for one hour 1230pm-130pm  -Soft Foods for the rest of the day 130pm until tomorrow  Esophageal Stricture (Narrowing) The esophagus is the long, narrow tube which carries food and liquid from the mouth to the stomach. Sometimes a part of the esophagus becomes narrow and makes it difficult, painful, or even impossible to swallow. This is called an esophageal stricture.  SYMPTOMS Some of the problems are difficulty swallowing or pain with swallowing. CAUSES Common causes of blockage or strictures of the esophagus are:  Exposure of the lower esophagus to the acid from the stomach may cause narrowing.   Hiatal hernia in which a small part of the stomach bulges up through the diaphragm can cause a narrowing in the bottom of the esophagus.   Scleroderma is a tissue disorder that affects the esophagus and makes swallowing difficult.   Achalasia is an absence of nerves in the lower esophagus and to the esophageal sphincter. This absence of nerves may be congenital (present since birth). This can cause irregular spasms which do not allow food and fluid through.   Strictures may develop from swallowing materials which damage the esophagus. Examples are acids or alkalis such as lye.   Schatzki's Ring is a narrow ring of non-cancerous tissue which narrows the lower esophagus. The cause of this is unknown.   Growths can block the esophagus.  DIAGNOSIS Your caregiver often suspects this problem by taking a medical history. They will also do a physical exam. They may then take X-rays and/or perform an endoscopy. Endoscopy is an exam in which a tube like a small flexible telescope is used to look at your esophagus.  TREATMENT AND PROCEDURE  One form of treatment is to dilate the narrow area. This means to stretch it.   When this is not successful, chest surgery may be required. This is a much more  extensive form of treatment with a longer recovery time.  Both of the above treatments make the passage of food and water into the stomach easier. They also make it easier for stomach contents to bubble back into the esophagus. Special medications may be used following the procedure to help prevent further narrowing. Medications may be used to lower the amount of acid in the stomach juice.  SEEK IMMEDIATE MEDICAL CARE IF:  Your swallowing is becoming more painful, difficult, or you are unable to swallow.   You vomit up blood.   You develop black tarry stools.   You develop chills or an unexplained fever of over 101 F (38.3 C).   You develop chest or abdominal pain.   You develop shortness of breath, feel lightheaded, or faint.  Follow up with medical care as your caregiver suggests. Document Released: 07/29/2006 Document Re-Released: 09/15/2007 [facility] Patient Information 2011 [place], [place].

## 2011-09-13 NOTE — Progress Notes (Signed)
Agree with Ms. Esterwood's assessment and plan. Carl E. Gessner, MD, FACG   

## 2011-09-16 ENCOUNTER — Telehealth: Payer: Self-pay | Admitting: *Deleted

## 2011-09-16 NOTE — Telephone Encounter (Signed)
Follow up Call- Patient questions:  Do you have a fever, pain , or abdominal swelling? no Pain Score  0 *  Have you tolerated food without any problems? yes  Have you been able to return to your normal activities? yes  Do you have any questions about your discharge instructions: Diet   no Medications  no Follow up visit  no  Do you have questions or concerns about your Care? no  Actions: * If pain score is 4 or above: No action needed, pain <4.  Pt states she had a lot of bloating over the weekend but is having no discomfort or bloating at this time

## 2011-09-17 ENCOUNTER — Telehealth: Payer: Self-pay | Admitting: Gastroenterology

## 2011-09-17 NOTE — Telephone Encounter (Signed)
Spoke with pt and per her last OV note the Nexium caused her diarrhea. Per Mike Gip PA pt was to take Pepcid 20mg  twice daily if she did not tolerate the Nexium. Pt aware.

## 2011-09-19 ENCOUNTER — Inpatient Hospital Stay: Admit: 2011-09-19 | Payer: Self-pay | Admitting: Orthopedic Surgery

## 2011-09-19 SURGERY — ARTHROPLASTY, SHOULDER, TOTAL
Anesthesia: Regional | Laterality: Right

## 2011-09-26 ENCOUNTER — Ambulatory Visit: Payer: Medicare Other | Admitting: Gastroenterology

## 2011-09-30 ENCOUNTER — Encounter (HOSPITAL_COMMUNITY): Payer: Self-pay

## 2011-09-30 ENCOUNTER — Ambulatory Visit (HOSPITAL_COMMUNITY)
Admission: RE | Admit: 2011-09-30 | Discharge: 2011-09-30 | Disposition: A | Discharge status: Home / Self Care | Payer: Medicare Other | Source: Ambulatory Visit | Attending: Orthopedic Surgery | Admitting: Orthopedic Surgery

## 2011-09-30 ENCOUNTER — Other Ambulatory Visit (HOSPITAL_COMMUNITY): Payer: Self-pay | Admitting: Orthopedic Surgery

## 2011-09-30 ENCOUNTER — Encounter (HOSPITAL_COMMUNITY)
Admission: RE | Admit: 2011-09-30 | Discharge: 2011-09-30 | Disposition: A | Discharge status: Home / Self Care | Payer: Medicare Other | Source: Ambulatory Visit | Attending: Orthopedic Surgery | Admitting: Orthopedic Surgery

## 2011-09-30 DIAGNOSIS — Z01811 Encounter for preprocedural respiratory examination: Secondary | ICD-10-CM

## 2011-09-30 DIAGNOSIS — Z01812 Encounter for preprocedural laboratory examination: Secondary | ICD-10-CM | POA: Insufficient documentation

## 2011-09-30 DIAGNOSIS — Z01818 Encounter for other preprocedural examination: Secondary | ICD-10-CM | POA: Insufficient documentation

## 2011-09-30 LAB — SURGICAL PCR SCREEN: MRSA, PCR: NEGATIVE

## 2011-09-30 LAB — COMPREHENSIVE METABOLIC PANEL
ALT: 25 U/L (ref 0–35)
Alkaline Phosphatase: 108 U/L (ref 39–117)
BUN: 12 mg/dL (ref 6–23)
CO2: 28 mEq/L (ref 19–32)
Calcium: 10.2 mg/dL (ref 8.4–10.5)
GFR calc Af Amer: >90 mL/min (ref >90)
GFR calc non Af Amer: 85 mL/min — ABNORMAL LOW (ref >90)
Glucose, Bld: 96 mg/dL (ref 70–99)
Potassium: 4.3 mEq/L (ref 3.5–5.1)
Total Protein: 6.9 g/dL (ref 6.0–8.3)

## 2011-09-30 LAB — PROTIME-INR
INR: 0.93 (ref 0.00–1.49)
Prothrombin Time: 12.7 seconds (ref 11.6–15.2)

## 2011-09-30 LAB — URINALYSIS, ROUTINE W REFLEX MICROSCOPIC
Ketones, ur: NEGATIVE mg/dL
Protein, ur: NEGATIVE mg/dL
Urobilinogen, UA: 0.2 mg/dL (ref 0.0–1.0)

## 2011-09-30 LAB — CBC
Hemoglobin: 14 g/dL (ref 12.0–15.0)
Platelets: 209 10*3/uL (ref 150–400)
RBC: 4.31 MIL/uL (ref 3.87–5.11)
WBC: 7.6 10*3/uL (ref 4.0–10.5)

## 2011-09-30 LAB — TYPE AND SCREEN: ABO/RH(D): O NEG

## 2011-09-30 LAB — URINE MICROSCOPIC-ADD ON

## 2011-09-30 NOTE — Pre-Procedure Instructions (Signed)
20 [facility] P[facility]  09/30/2011   Your procedure is scheduled on:  10/10/2011  Report to [facility] at 530am AM.  Call this number if you have problems the morning of surgery: [phone]   Remember:   Do not eat food:After Midnight.  Do not drink clear liquids: 4 Hours before arrival.  Take these medicines the morning of surgery with A SIP OF WATER: alprazolam,nexium,lorcet,neurontin,aciphex  Do not wear jewelry, make-up or nail polish.  Do not wear lotions, powders, or perfumes. You may wear deodorant.  Do not shave 48 hours prior to surgery.  Do not bring valuables to the hospital.  Contacts, dentures or bridgework may not be worn into surgery.  Leave suitcase in the car. After surgery it may be brought to your room.  For patients admitted to the hospital, checkout time is 11:00 AM the day of discharge.   Patients discharged the day of surgery will not be allowed to drive home.  Name and phone number of your driver: family  Special Instructions: Incentive Spirometry - Practice and bring it with you on the day of surgery. and CHG Shower Use Special Wash: 1/2 bottle night before surgery and 1/2 bottle morning of surgery.   Please read over the following fact sheets that you were given: Total Joint Packet, MRSA Information and Surgical Site Infection Prevention

## 2011-10-09 MED ORDER — LACTATED RINGERS IV SOLN: INTRAVENOUS | Status: DC

## 2011-10-09 MED ORDER — CEFAZOLIN SODIUM 1-5 GM-% IV SOLN
1.0000 g | INTRAVENOUS | Status: DC
  Filled 2011-10-09: qty 50

## 2011-10-10 ENCOUNTER — Encounter (HOSPITAL_COMMUNITY)
Admission: RE | Disposition: A | Discharge status: Home Health | Payer: Self-pay | Source: Ambulatory Visit | Attending: Orthopedic Surgery

## 2011-10-10 ENCOUNTER — Inpatient Hospital Stay (HOSPITAL_COMMUNITY)
Admission: RE | Admit: 2011-10-10 | Discharge: 2011-10-11 | DRG: 484 | Disposition: A | Discharge status: Home Health | Payer: Medicare Other | Source: Ambulatory Visit | Attending: Orthopedic Surgery | Admitting: Orthopedic Surgery

## 2011-10-10 ENCOUNTER — Inpatient Hospital Stay (HOSPITAL_COMMUNITY): Payer: Medicare Other | Admitting: Vascular Surgery

## 2011-10-10 ENCOUNTER — Encounter (HOSPITAL_COMMUNITY): Payer: Self-pay | Admitting: *Deleted

## 2011-10-10 ENCOUNTER — Encounter (HOSPITAL_COMMUNITY): Payer: Self-pay | Admitting: Vascular Surgery

## 2011-10-10 DIAGNOSIS — M5430 Sciatica, unspecified side: Secondary | ICD-10-CM | POA: Diagnosis present

## 2011-10-10 DIAGNOSIS — I10 Essential (primary) hypertension: Secondary | ICD-10-CM | POA: Diagnosis present

## 2011-10-10 DIAGNOSIS — M19019 Primary osteoarthritis, unspecified shoulder: Principal | ICD-10-CM | POA: Diagnosis present

## 2011-10-10 DIAGNOSIS — M129 Arthropathy, unspecified: Secondary | ICD-10-CM

## 2011-10-10 HISTORY — PX: TOTAL SHOULDER ARTHROPLASTY: SHX126

## 2011-10-10 SURGERY — ARTHROPLASTY, SHOULDER, TOTAL
Anesthesia: Regional | Laterality: Right | Wound class: Clean

## 2011-10-10 MED ORDER — ONDANSETRON HCL 4 MG PO TABS: 4.0000 mg | ORAL_TABLET | Freq: Four times a day (QID) | ORAL | Status: DC | PRN

## 2011-10-10 MED ORDER — SODIUM CHLORIDE 0.9 % IR SOLN
Status: DC | PRN
  Administered 2011-10-10: 1000 mL

## 2011-10-10 MED ORDER — CEFAZOLIN SODIUM-DEXTROSE 2-3 GM-% IV SOLR
2.0000 g | INTRAVENOUS | Status: DC
  Filled 2011-10-10: qty 50

## 2011-10-10 MED ORDER — BUPIVACAINE-EPINEPHRINE PF 0.5-1:200000 % IJ SOLN
INTRAMUSCULAR | Status: DC | PRN
  Administered 2011-10-10: 30 mL

## 2011-10-10 MED ORDER — THROMBIN 5000 UNITS EX KIT
PACK | CUTANEOUS | Status: DC | PRN
  Administered 2011-10-10: 5000 [IU] via TOPICAL

## 2011-10-10 MED ORDER — CEFAZOLIN SODIUM 1-5 GM-% IV SOLN
INTRAVENOUS | Status: DC | PRN
  Administered 2011-10-10: 2 g via INTRAVENOUS

## 2011-10-10 MED ORDER — BISACODYL 10 MG RE SUPP: 10.0000 mg | Freq: Every day | RECTAL | Status: DC | PRN

## 2011-10-10 MED ORDER — TEMAZEPAM 15 MG PO CAPS: 15.0000 mg | ORAL_CAPSULE | Freq: Every evening | ORAL | Status: DC | PRN

## 2011-10-10 MED ORDER — HYDROMORPHONE HCL PF 1 MG/ML IJ SOLN
INTRAMUSCULAR | Status: AC
  Administered 2011-10-10: 0.5 mg
  Filled 2011-10-10: qty 1

## 2011-10-10 MED ORDER — MAGNESIUM HYDROXIDE 400 MG/5ML PO SUSP: 30.0000 mL | Freq: Two times a day (BID) | ORAL | Status: DC | PRN

## 2011-10-10 MED ORDER — FLEET ENEMA 7-19 GM/118ML RE ENEM: 1.0000 | ENEMA | Freq: Every day | RECTAL | Status: DC | PRN

## 2011-10-10 MED ORDER — HETASTARCH-ELECTROLYTES 6 % IV SOLN
INTRAVENOUS | Status: DC | PRN
  Administered 2011-10-10: 09:00:00 via INTRAVENOUS

## 2011-10-10 MED ORDER — METHOCARBAMOL 500 MG PO TABS
500.0000 mg | ORAL_TABLET | Freq: Four times a day (QID) | ORAL | Status: DC | PRN
  Administered 2011-10-10 – 2011-10-11 (×2): 500 mg via ORAL
  Filled 2011-10-10 (×2): qty 1

## 2011-10-10 MED ORDER — ONDANSETRON HCL 4 MG/2ML IJ SOLN
INTRAMUSCULAR | Status: DC | PRN
  Administered 2011-10-10: 4 mg via INTRAVENOUS

## 2011-10-10 MED ORDER — METOCLOPRAMIDE HCL 10 MG PO TABS: 5.0000 mg | ORAL_TABLET | Freq: Three times a day (TID) | ORAL | Status: DC | PRN

## 2011-10-10 MED ORDER — FENTANYL CITRATE 0.05 MG/ML IJ SOLN
INTRAMUSCULAR | Status: DC | PRN
  Administered 2011-10-10: 50 ug via INTRAVENOUS
  Administered 2011-10-10: 100 ug via INTRAVENOUS
  Administered 2011-10-10 (×2): 50 ug via INTRAVENOUS

## 2011-10-10 MED ORDER — ONDANSETRON HCL 4 MG/2ML IJ SOLN: 4.0000 mg | Freq: Four times a day (QID) | INTRAMUSCULAR | Status: DC | PRN

## 2011-10-10 MED ORDER — SODIUM CHLORIDE 0.9 % IV SOLN
10000.0000 ug | INTRAVENOUS | Status: DC | PRN
  Administered 2011-10-10: 40 ug via INTRAVENOUS
  Administered 2011-10-10: 10 ug/min via INTRAVENOUS

## 2011-10-10 MED ORDER — OXYCODONE-ACETAMINOPHEN 5-325 MG PO TABS
1.0000 | ORAL_TABLET | ORAL | Status: DC | PRN
  Administered 2011-10-10: 1 via ORAL
  Administered 2011-10-11 (×3): 2 via ORAL
  Filled 2011-10-10 (×2): qty 2
  Filled 2011-10-10: qty 1
  Filled 2011-10-10: qty 2

## 2011-10-10 MED ORDER — LACTATED RINGERS IV SOLN
INTRAVENOUS | Status: DC
  Administered 2011-10-10 – 2011-10-11 (×2): via INTRAVENOUS

## 2011-10-10 MED ORDER — GLYCOPYRROLATE 0.2 MG/ML IJ SOLN
INTRAMUSCULAR | Status: DC | PRN
  Administered 2011-10-10: 1 mg via INTRAVENOUS

## 2011-10-10 MED ORDER — NEOSTIGMINE METHYLSULFATE 1 MG/ML IJ SOLN
INTRAMUSCULAR | Status: DC | PRN
  Administered 2011-10-10: 5 mg via INTRAVENOUS

## 2011-10-10 MED ORDER — ONDANSETRON HCL 4 MG/2ML IJ SOLN
4.0000 mg | Freq: Once | INTRAMUSCULAR | Status: DC | PRN
  Administered 2011-10-10: 4 mg via INTRAVENOUS

## 2011-10-10 MED ORDER — MENTHOL 3 MG MT LOZG
1.0000 | LOZENGE | OROMUCOSAL | Status: DC | PRN
  Filled 2011-10-10: qty 9

## 2011-10-10 MED ORDER — METHOCARBAMOL 100 MG/ML IJ SOLN
500.0000 mg | Freq: Four times a day (QID) | INTRAVENOUS | Status: DC | PRN
  Filled 2011-10-10: qty 5

## 2011-10-10 MED ORDER — METOCLOPRAMIDE HCL 5 MG/ML IJ SOLN
5.0000 mg | Freq: Three times a day (TID) | INTRAMUSCULAR | Status: DC | PRN
  Filled 2011-10-10: qty 2

## 2011-10-10 MED ORDER — DIPHENHYDRAMINE HCL 12.5 MG/5ML PO ELIX
12.5000 mg | ORAL_SOLUTION | ORAL | Status: DC | PRN
  Filled 2011-10-10: qty 10

## 2011-10-10 MED ORDER — ROCURONIUM BROMIDE 100 MG/10ML IV SOLN
INTRAVENOUS | Status: DC | PRN
  Administered 2011-10-10: 50 mg via INTRAVENOUS

## 2011-10-10 MED ORDER — PHENOL 1.4 % MT LIQD: 1.0000 | OROMUCOSAL | Status: DC | PRN

## 2011-10-10 MED ORDER — THROMBIN 5000 UNITS EX KIT
PACK | OROMUCOSAL | Status: DC | PRN
  Administered 2011-10-10: 09:00:00 via TOPICAL

## 2011-10-10 MED ORDER — ACETAMINOPHEN 650 MG RE SUPP: 650.0000 mg | Freq: Four times a day (QID) | RECTAL | Status: DC | PRN

## 2011-10-10 MED ORDER — PROPOFOL 10 MG/ML IV EMUL
INTRAVENOUS | Status: DC | PRN
  Administered 2011-10-10: 200 mg via INTRAVENOUS

## 2011-10-10 MED ORDER — CEFAZOLIN SODIUM 1-5 GM-% IV SOLN
1.0000 g | Freq: Four times a day (QID) | INTRAVENOUS | Status: AC
  Administered 2011-10-10 – 2011-10-11 (×3): 1 g via INTRAVENOUS
  Filled 2011-10-10 (×3): qty 50

## 2011-10-10 MED ORDER — POLYETHYLENE GLYCOL 3350 17 G PO PACK
17.0000 g | PACK | Freq: Every day | ORAL | Status: DC | PRN
  Filled 2011-10-10: qty 1

## 2011-10-10 MED ORDER — KETOROLAC TROMETHAMINE 15 MG/ML IJ SOLN
15.0000 mg | Freq: Four times a day (QID) | INTRAMUSCULAR | Status: DC
  Administered 2011-10-10 – 2011-10-11 (×5): 15 mg via INTRAVENOUS
  Filled 2011-10-10 (×7): qty 1

## 2011-10-10 MED ORDER — HYDROMORPHONE HCL PF 1 MG/ML IJ SOLN: 0.2500 mg | INTRAMUSCULAR | Status: DC | PRN

## 2011-10-10 MED ORDER — MIDAZOLAM HCL 5 MG/5ML IJ SOLN
INTRAMUSCULAR | Status: DC | PRN
  Administered 2011-10-10 (×2): 2 mg via INTRAVENOUS

## 2011-10-10 MED ORDER — PHENYLEPHRINE HCL 10 MG/ML IJ SOLN
INTRAMUSCULAR | Status: DC | PRN
  Administered 2011-10-10 (×2): 80 ug via INTRAVENOUS

## 2011-10-10 MED ORDER — HYDROMORPHONE HCL PF 1 MG/ML IJ SOLN: 0.5000 mg | INTRAMUSCULAR | Status: DC | PRN

## 2011-10-10 MED ORDER — BISACODYL 5 MG PO TBEC: 10.0000 mg | DELAYED_RELEASE_TABLET | Freq: Every day | ORAL | Status: DC | PRN

## 2011-10-10 MED ORDER — DOCUSATE SODIUM 100 MG PO CAPS
100.0000 mg | ORAL_CAPSULE | Freq: Two times a day (BID) | ORAL | Status: DC
  Administered 2011-10-10 – 2011-10-11 (×3): 100 mg via ORAL
  Filled 2011-10-10 (×4): qty 1

## 2011-10-10 MED ORDER — LACTATED RINGERS IV SOLN
INTRAVENOUS | Status: DC | PRN
  Administered 2011-10-10 (×2): via INTRAVENOUS

## 2011-10-10 MED ORDER — ACETAMINOPHEN 325 MG PO TABS: 650.0000 mg | ORAL_TABLET | Freq: Four times a day (QID) | ORAL | Status: DC | PRN

## 2011-10-10 SURGICAL SUPPLY — 57 items
BLADE SAW SGTL 83.5X18.5 (BLADE) ×2 IMPLANT
CEMENT BONE DEPUY (Cement) ×1 IMPLANT
CLOTH BEACON ORANGE TIMEOUT ST (SAFETY) ×2 IMPLANT
COVER SURGICAL LIGHT HANDLE (MISCELLANEOUS) ×2 IMPLANT
DRAPE INCISE IOBAN 66X45 STRL (DRAPES) ×2 IMPLANT
DRAPE SURG 17X11 SM STRL (DRAPES) ×2 IMPLANT
DRAPE SURG 17X23 STRL (DRAPES) ×2 IMPLANT
DRAPE U-SHAPE 47X51 STRL (DRAPES) ×3 IMPLANT
DRILL BIT 7/64X5 (BIT) IMPLANT
DRSG MEPILEX BORDER 4X8 (GAUZE/BANDAGES/DRESSINGS) ×2 IMPLANT
DURAPREP 26ML APPLICATOR (WOUND CARE) ×2 IMPLANT
ELECT CAUTERY BLADE 6.4 (BLADE) ×2 IMPLANT
ELECT REM PT RETURN 9FT ADLT (ELECTROSURGICAL) ×2
ELECTRODE REM PT RTRN 9FT ADLT (ELECTROSURGICAL) ×1 IMPLANT
FACESHIELD LNG OPTICON STERILE (SAFETY) ×5 IMPLANT
GLOVE BIO SURGEON STRL SZ7.5 (GLOVE) ×2 IMPLANT
GLOVE BIO SURGEON STRL SZ8 (GLOVE) ×2 IMPLANT
GLOVE ECLIPSE 8.5 STRL (GLOVE) ×1 IMPLANT
GLOVE EUDERMIC 7 POWDERFREE (GLOVE) ×2 IMPLANT
GLOVE SS BIOGEL STRL SZ 7.5 (GLOVE) ×1 IMPLANT
GLOVE SUPERSENSE BIOGEL SZ 7.5 (GLOVE) ×2
GOWN STRL NON-REIN LRG LVL3 (GOWN DISPOSABLE) ×2 IMPLANT
KIT BASIN OR (CUSTOM PROCEDURE TRAY) ×2 IMPLANT
KIT ROOM TURNOVER OR (KITS) ×2 IMPLANT
MANIFOLD NEPTUNE II (INSTRUMENTS) ×2 IMPLANT
MATRIX HEMOSTAT SURGIFLO (HEMOSTASIS) ×1 IMPLANT
NDL HYPO 25GX1X1/2 BEV (NEEDLE) IMPLANT
NDL SUT 6 .5 CRC .975X.05 MAYO (NEEDLE) ×1 IMPLANT
NEEDLE HYPO 25GX1X1/2 BEV (NEEDLE) ×2 IMPLANT
NEEDLE MAYO TAPER (NEEDLE) ×2
NS IRRIG 1000ML POUR BTL (IV SOLUTION) ×2 IMPLANT
PACK SHOULDER (CUSTOM PROCEDURE TRAY) ×2 IMPLANT
PAD ARMBOARD 7.5X6 YLW CONV (MISCELLANEOUS) ×2 IMPLANT
PASSER SUT SWANSON 36MM LOOP (INSTRUMENTS) ×1 IMPLANT
PIN METAGLENE 2.5 (PIN) ×1 IMPLANT
SLING ARM IMMOBILIZER LRG (SOFTGOODS) ×2 IMPLANT
SMARTMIX MINI TOWER (MISCELLANEOUS) ×2
SPONGE LAP 18X18 X RAY DECT (DISPOSABLE) ×2 IMPLANT
SPONGE LAP 4X18 X RAY DECT (DISPOSABLE) ×3 IMPLANT
SPONGE SURGIFOAM ABS GEL SZ50 (HEMOSTASIS) ×1 IMPLANT
STRIP CLOSURE SKIN 1/2X4 (GAUZE/BANDAGES/DRESSINGS) ×2 IMPLANT
STRIP CLOSURE SKIN 1X5 (GAUZE/BANDAGES/DRESSINGS) ×1 IMPLANT
SUCTION FRAZIER TIP 10 FR DISP (SUCTIONS) ×2 IMPLANT
SUT BONE WAX W31G (SUTURE) ×1 IMPLANT
SUT FIBERWIRE #2 38 T-5 BLUE (SUTURE) ×4
SUT MNCRL AB 3-0 PS2 18 (SUTURE) ×2 IMPLANT
SUT VIC AB 1 CT1 27 (SUTURE) ×6
SUT VIC AB 1 CT1 27XBRD ANBCTR (SUTURE) ×3 IMPLANT
SUT VIC AB 2-0 CT1 27 (SUTURE) ×4
SUT VIC AB 2-0 CT1 TAPERPNT 27 (SUTURE) ×2 IMPLANT
SUTURE FIBERWR #2 38 T-5 BLUE (SUTURE) ×2 IMPLANT
SYR 30ML SLIP (SYRINGE) ×2 IMPLANT
SYR CONTROL 10ML LL (SYRINGE) ×1 IMPLANT
TOWEL OR 17X24 6PK STRL BLUE (TOWEL DISPOSABLE) ×2 IMPLANT
TOWEL OR 17X26 10 PK STRL BLUE (TOWEL DISPOSABLE) ×2 IMPLANT
TOWER SMARTMIX MINI (MISCELLANEOUS) ×1 IMPLANT
WATER STERILE IRR 1000ML POUR (IV SOLUTION) ×2 IMPLANT

## 2011-10-10 NOTE — Anesthesia Postprocedure Evaluation (Signed)
Anesthesia Post Note  Patient: Veronica Garza  Procedure(s) Performed:  TOTAL SHOULDER ARTHROPLASTY  Anesthesia type: General with peripheral nerve block  Patient location: PACU  Post pain: Pain level controlled and Adequate analgesia  Post assessment: Post-op Vital signs reviewed, Patient's Cardiovascular Status Stable, Respiratory Function Stable, Patent Airway and Pain level controlled  Last Vitals:  Filed Vitals:   10/10/11 1012  BP:   Pulse:   Temp: 36.4 C  Resp:     Post vital signs: Reviewed and stable  Level of consciousness: awake, alert  and oriented  Complications: No apparent anesthesia complications

## 2011-10-10 NOTE — Brief Op Note (Addendum)
10/10/2011  9:46 AM  PATIENT:  Veronica Garza  69 y.o. female  PRE-OPERATIVE DIAGNOSIS:  Right Shoulder OA  POST-OPERATIVE DIAGNOSIS:  same  PROCEDURE:  Procedure(s): TOTAL SHOULDER ARTHROPLASTY  SURGEON:  Surgeon(s): Vania Rea Sabreena Vogan  PHYSICIAN ASSISTANT:   ASSISTANTS:Shuford PAC  ANESTHESIA:   general plus ISB EBL:  Total I/O In: 1000 [I.V.:1000] Out: - 650  BLOOD ADMINISTERED:none  DRAINS: none   LOCAL MEDICATIONS USED:  NONE  SPECIMEN:  No Specimen  DISPOSITION OF SPECIMEN:  N/A  COUNTS:  YES  TOURNIQUET:  * No tourniquets in log *  DICTATION: .Other Dictation: Dictation Number [id number]  PLAN OF CARE: Admit to inpatient   PATIENT DISPOSITION:  PACU - hemodynamically stable.   Delay start of Pharmacological VTE agent (>24hrs) due to surgical blood loss or risk of bleeding:  {YES/NO/NOT APPLICABLE:20182 Depuy global size 10 press fit stem, 44x18 eccentric head, cemented pegged 40 glenoid

## 2011-10-10 NOTE — Op Note (Signed)
NAMENAHAL, WANLESS NO.:  [id number]  MEDICAL RECORD NO.:  [id number]  LOCATION:  [facility]                         FACILITY:  [facility]  PHYSICIAN:  Vania Rea. Garion Wempe, M.D.  DATE OF BIRTH:  [birth year]  DATE OF PROCEDURE:  10/10/2011 DATE OF DISCHARGE:                              OPERATIVE REPORT   PREOPERATIVE DIAGNOSIS:  Right shoulder end-stage glenohumeral arthrosis.  POSTOPERATIVE DIAGNOSIS:  Right shoulder end-stage glenohumeral arthrosis.  PROCEDURE:  Right total shoulder arthroplasty utilizing a DePuy global size 10 press-fit stem with a 44 x 18 eccentric head and a cemented pegged 40 glenoid.  SURGEON:  Vania Rea. Makenzie Weisner, MD  ASSISTANT:  Ralene Bathe, PA-C  ANESTHESIA:  General endotracheal as well as an interscalene block.  ESTIMATED BLOOD LOSS:  650 mL.  DRAINS:  None.  HISTORY:  Ms. Cookston is a 70 year old female who has had chronic and progressive increasing right shoulder pain with known end-stage osteoarthrosis.  She has had a previous left total shoulder arthroplasty and has ultimately done well postop and now returns for planned right total shoulder arthroplasty.  Preoperatively, I counseled Ms. Bhardwaj on treatment options as well as risks versus benefits thereof.  Possible surgical complications were reviewed including the potential for bleeding, infection, neurovascular injury, persistent pain, loss of motion, anesthetic complication, and possible need for additional surgery.  She understands and accepts and agrees to our planned procedure.  PROCEDURE IN DETAIL:  After undergoing routine preop evaluation, the patient received prophylactic antibiotics.  An interscalene block was established in the holding area by the Anesthesia Department.  Placed supine on the operative table and underwent smooth induction of general endotracheal anesthesia.  Placed in beach-chair position and appropriately padded and protected.  Right shoulder girdle was  sterilely prepped and draped in standard fashion.  Time-out was called.  An anterior approach to the right shoulder was made through the deltopectoral interval with incision approximately 12 cm in length. Skin flaps were elevated and electrocautery was used for hemostasis. The cephalic vein was identified and retracted laterally with the deltoid.  The deltopectoral interval was then developed bluntly and self- retaining retractors were placed.  A number of adhesions were found beneath the subacromial subdeltoid region and these were divided bluntly and it was at this point that a large venous branch between the deltoid and the underlying proximal humerus was divided and this subsequently were retracted up into the deltoid muscle and vigorous venous bleeding was encountered at this point.  We spent approximately 10 minutes gaining control of the bleeding and ultimately utilized a combination electrocautery with Gelfoam and thrombin and packed the region.  We did have rather significant blood loss at this point, although this was ultimately controlled and the patient did maintain stable hemodynamic status although she was placed on a Neo drip to help maintain pressures but was certainly stable.  Once we had the bleeding under control, we proceeded with the operation and identified the biceps tendon which was unroofed in the bicipital groove, tenotomized for later tenodesis.  The subscapularis was then divided away from its insertion into the lesser tuberosity leaving an approximately 2 cm cuff of tissue for later repair but  then the rotator interval was split to the base of the coracoid and the subscapularis free margin was then tagged with a series of #2 FiberWire sutures and was circumferentially mobilized and retracted medially.  We then used electrocautery to divide the capsule anteriorly, inferiorly, and around posteriorly to deliver the humeral head through the wound, taking care to  protect the axillary pouch contents and the rotator cuff.  Using the extramedullary guide, we then outlined our proposed humeral head resection which was performed with an oscillating saw at approximately 30 degrees retroversion.  The humeral head was then taken to the back table and showed a closest fit to the 44 implant.  At this point, the proximal humerus was then exposed and we used hand reaming to gain access to the humeral medullary canal and reamed up to size 10.  We then performed a cookie cutter broaching and then broached up to size 10 with excellent fit.  At this point, the trial was removed. Metal cap placed across the cut surface of the proximal humerus and then we exposed the glenoid circumferentially using the Fakuda retractors posteriorly and pitchfork anteriorly and snake tong superiorly and inferiorly as necessary.  We performed a complete resection of the labrum and biceps stump.  We then sized the glenoid which had an unusual morphology which was very tall, cephalad, and caudad, somewhat narrow anterior to posterior.  The size 40 had the best fit, so the central guide pin was placed, reamed with a 40, removed residual soft tissue and bone at the margins and then performed central drilling and then used the guide to place the peripheral peg holes.  This overall was much to our satisfaction.  The trial showed excellent fit.  At this point, the glenoid was copiously irrigated and all debris was removed meticulously. We then mixed cement and introduced it into the peripheral peg holes and then impacted the glenoid with excellent fixation and purchase.  Very stable construct.  After the cement appropriately hardened, we then removed the retractors, returned our attention to the proximal humerus. We then irrigated the canal, retrieved the size 10 implant and introduced this into the canal.  I used bone graft from the head to bone graft proximally in the metaphyseal region  and performed final seating of the implant, maintaining 30 degrees of retroversion with excellent fixation.  We performed trial reductions and the 44 x 18 eccentric showed the best coverage and sought excellent soft tissue balance with good AP mobility of the shoulder.  Trial was removed and meticulously cleaned and dried the Morse taper and impacted the 44 x 18 eccentric head into position with overall alignment much to our satisfaction. Final reduction was then performed.  We then repaired the subscapularis back to the lesser tuberosity through the soft tissue stump using the #2 FiberWires and then closed the rotator interval with figure-of-eight #2 FiberWire and also performed a biceps tenodesis with the FiberWire as well.  We then carefully returned our attention to the subdeltoid region where we had previously placed the Gelfoam and thrombin and found we had good hemostasis in this area.  Final irrigation was completed.  We did inject a syringe of FloSeal into the subacromial/subdeltoid region and gently reapproximated the deltopectoral interval and this was then closed with a series of simple #1 Vicryl sutures.  2-0 Vicryl used for the subcu layer and intracuticular 3-0 Monocryl for the skin followed by Steri-Strips.  Dry dressing was then taped about the right shoulder and  right arm was placed in a sling.  The patient was then awakened, extubated, and taken to recovery room in stable condition.     Vania Rea. Zaydin Billey, M.D.     KMS/MEDQ  D:  10/10/2011  T:  10/10/2011  Job:  [id number]

## 2011-10-10 NOTE — Progress Notes (Signed)
Phillips mp50 device was not captured on data validation sheet completely. See doc flowsheet

## 2011-10-10 NOTE — Anesthesia Procedure Notes (Addendum)
Anesthesia Regional Block:  Interscalene brachial plexus block  Pre-Anesthetic Checklist: ,, timeout performed, Correct Patient, Correct Site, Correct Laterality, Correct Procedure, Correct Position, site marked, Risks and benefits discussed, Surgical consent,  At surgeon's request and post-op pain management  Laterality: Right  Prep: chloraprep       Needles:  Injection technique: Single-shot  Needle Type: Echogenic Stimulator Needle     Needle Length: 5cm 5 cm Needle Gauge: 22 and 22 G    Additional Needles:  Procedures: ultrasound guided Interscalene brachial plexus block  Nerve Stimulator or Paresthesia:  Response: biceps flexion, 0.44 mA,   Additional Responses:   Narrative:  Start time: 10/10/2011 7:10 AM End time: 10/10/2011 7:22 AM Injection made incrementally with aspirations every 5 mL.  Performed by: Personally  Anesthesiologist: Dr Chaney Malling  Additional Notes: Pt tolerated the procedure well.  No complications.       Narrative:

## 2011-10-10 NOTE — Transfer of Care (Cosign Needed)
Immediate Anesthesia Transfer of Care Note  Patient: Veronica Garza  Procedure(s) Performed:  TOTAL SHOULDER ARTHROPLASTY  Patient Location: PACU  Anesthesia Type: General  Level of Consciousness: awake, alert  and oriented  Airway & Oxygen Therapy: Patient Spontanous Breathing and Patient connected to nasal cannula oxygen  Post-op Assessment: Report given to PACU RN, Post -op Vital signs reviewed and stable and Patient moving all extremities  Post vital signs: stable, reviewed  Complications: No apparent anesthesia complications

## 2011-10-10 NOTE — H&P (Signed)
See office H and P 

## 2011-10-10 NOTE — Anesthesia Preprocedure Evaluation (Signed)
Anesthesia Evaluation  Patient identified by MRN, date of birth, ID band Patient awake    Reviewed: Allergy & Precautions, H&P , NPO status , Patient's Chart, lab work & pertinent test results  Airway Mallampati: II  Neck ROM: full    Dental   Pulmonary sleep apnea ,          Cardiovascular hypertension,     Neuro/Psych    GI/Hepatic GERD-  ,  Endo/Other    Renal/GU      Musculoskeletal   Abdominal   Peds  Hematology   Anesthesia Other Findings   Reproductive/Obstetrics                           Anesthesia Physical Anesthesia Plan  ASA: III  Anesthesia Plan: General   Post-op Pain Management: MAC Combined w/ Regional for Post-op pain   Induction: Intravenous  Airway Management Planned: Oral ETT  Additional Equipment:   Intra-op Plan:   Post-operative Plan: Extubation in OR  Informed Consent: I have reviewed the patients History and Physical, chart, labs and discussed the procedure including the risks, benefits and alternatives for the proposed anesthesia with the patient or authorized representative who has indicated his/her understanding and acceptance.     Plan Discussed with: CRNA and Surgeon  Anesthesia Plan Comments:         Anesthesia Quick Evaluation

## 2011-10-11 LAB — HEMOGLOBIN AND HEMATOCRIT, BLOOD: HCT: 27.3 % — ABNORMAL LOW (ref 36.0–46.0)

## 2011-10-11 MED ORDER — OXYCODONE-ACETAMINOPHEN 5-325 MG PO TABS
1.0000 | ORAL_TABLET | ORAL | Status: AC | PRN
Start: 2011-10-11 — End: 2011-10-21

## 2011-10-11 MED ORDER — LOSARTAN POTASSIUM 50 MG PO TABS
50.0000 mg | ORAL_TABLET | Freq: Every day | ORAL | Status: DC
Start: 2011-10-11 — End: 2011-10-11
  Administered 2011-10-11: 50 mg via ORAL
  Filled 2011-10-11 (×3): qty 1

## 2011-10-11 MED ORDER — ALPRAZOLAM 0.5 MG PO TABS: 0.5000 mg | ORAL_TABLET | Freq: Every evening | ORAL | Status: DC | PRN

## 2011-10-11 MED ORDER — OMEGA-3 FATTY ACIDS 1000 MG PO CAPS
1.0000 g | ORAL_CAPSULE | Freq: Every day | ORAL | Status: DC
  Administered 2011-10-11: 1 g via ORAL
  Filled 2011-10-11: qty 1

## 2011-10-11 MED ORDER — EST ESTROGENS-METHYLTEST 0.625-1.25 MG PO TABS
1.0000 | ORAL_TABLET | Freq: Every day | ORAL | Status: DC
  Administered 2011-10-11: 1 via ORAL
  Filled 2011-10-11: qty 1

## 2011-10-11 MED ORDER — PANTOPRAZOLE SODIUM 40 MG PO TBEC
40.0000 mg | DELAYED_RELEASE_TABLET | Freq: Every day | ORAL | Status: DC
  Administered 2011-10-11: 40 mg via ORAL
  Filled 2011-10-11: qty 1

## 2011-10-11 MED ORDER — METHOCARBAMOL 500 MG PO TABS: 500.0000 mg | ORAL_TABLET | Freq: Three times a day (TID) | ORAL | Status: AC | PRN

## 2011-10-11 MED ORDER — THERA M PLUS PO TABS
1.0000 | ORAL_TABLET | Freq: Every day | ORAL | Status: DC
  Administered 2011-10-11: 1 via ORAL
  Filled 2011-10-11: qty 1

## 2011-10-11 MED ORDER — TRAMADOL HCL 50 MG PO TABS: 50.0000 mg | ORAL_TABLET | Freq: Three times a day (TID) | ORAL | Status: DC | PRN

## 2011-10-11 MED ORDER — GABAPENTIN 300 MG PO CAPS
300.0000 mg | ORAL_CAPSULE | Freq: Every day | ORAL | Status: DC
Start: 2011-10-11 — End: 2011-10-11
  Administered 2011-10-11: 300 mg via ORAL
  Filled 2011-10-11: qty 1

## 2011-10-11 NOTE — Progress Notes (Signed)
Subjective: 1 Day Post-Op Procedure(s) (LRB): TOTAL SHOULDER ARTHROPLASTY (Right) Patient reports pain as mild.   Overall doing well Objective: Vital signs in last 24 hours: Temp:  [96.9 F (36.1 C)-99.4 F (37.4 C)] 97.6 F (36.4 C) (11/09 0640) Pulse Rate:  [73-94] 94  (11/09 0640) Resp:  [13-22] 16  (11/09 0640) BP: (99-148)/(64-82) 148/74 mmHg (11/09 0640) SpO2:  [96 %-100 %] 97 % (11/09 0640) FiO2 (%):  [3 %] 3 % (11/08 1500) Weight:  [84 kg (185 lb 3 oz)] 185 lb 3 oz (84 kg) (11/09 0212)  Intake/Output from previous day: 11/08 0701 - 11/09 0700 In: 4873.8 [P.O.:960; I.V.:3413.8; IV Piggyback:500] Out: 651 [Urine:1; Blood:650] Intake/Output this shift:     Basename 10/11/11 0459  HGB 9.3*    Basename 10/11/11 0459  WBC --  RBC --  HCT 27.3*  PLT --   No results found for this basename: NA:2,K:2,CL:2,CO2:2,BUN:2,CREATININE:2,GLUCOSE:2,CALCIUM:2 in the last 72 hours No results found for this basename: LABPT:2,INR:2 in the last 72 hours  Sensation intact distally Compartment soft. Incision dry.   Assessment/Plan: 1 Day Post-Op Procedure(s) (LRB): TOTAL SHOULDER ARTHROPLASTY (Right) D/C IV fluids Discharge home with home health  COD: Stable and Improved Tamim Skog 10/11/2011, 8:22 AM

## 2011-10-11 NOTE — Progress Notes (Signed)
Occupational Therapy Evaluation Patient Details Name: Veronica Garza MRN: [id number] DOB: 05-03-1941 Today's Date: 10/11/2011  Problem List:  Patient Active Problem List  Diagnoses  . OBSTRUCTIVE SLEEP APNEA  . STRICTURE AND STENOSIS OF ESOPHAGUS  . GERD  . IBD  . ARTHRITIS  . URINARY INCONTINENCE  . UTI'S, HX OF  . ANEMIA-NOS  . HYPERTENSION  . Other screening mammogram  . Routine general medical examination at a health care facility  . Esophageal stricture    Past Medical History:  Past Medical History  Diagnosis Date  . Hypertension   . Anemia   . OSA (obstructive sleep apnea)   . GERD (gastroesophageal reflux disease)   . Arthritis   . Urinary incontinence   . Pneumonia     History of  . Cataract   . Hx of shoulder replacement     lt   Past Surgical History:  Past Surgical History  Procedure Date  . Abdominal hysterectomy   . Vaginal sling   . Nasal sinus surgery   . Shoulder arthroscopy     Left  . Eye surgery     bilateral cataract extraction  . Upper gastrointestinal endoscopy     OT Assessment/Plan/Recommendation OT Assessment Clinical Impression Statement: Pt will benefit from OT services in the acute care setting to increase I with BADLs, functional transfers, and HEP in prep for d/c home with husband. OT Recommendation/Assessment: Patient will need skilled OT in the acute care venue OT Problem List: Decreased range of motion;Decreased strength;Decreased activity tolerance;Decreased knowledge of use of DME or AE;Decreased knowledge of precautions;Impaired UE functional use;Pain OT Therapy Diagnosis : Generalized weakness;Acute pain OT Plan OT Frequency: Min 2X/week OT Treatment/Interventions: Self-care/ADL training;Therapeutic exercise;DME and/or AE instruction;Therapeutic activities;Patient/family education OT Recommendation Follow Up Recommendations: Home health OT Equipment Recommended: None recommended by OT Individuals Consulted Consulted  and Agree with Results and Recommendations: Patient OT Goals Acute Rehab OT Goals OT Goal Formulation: With patient/family Time For Goal Achievement: 7 days ADL Goals Pt Will Perform Grooming: with modified independence;Sitting at sink ADL Goal: Grooming - Progress: Other (comment) Pt Will Perform Upper Body Bathing: Sitting, chair;with caregiver independent in assisting ADL Goal: Upper Body Bathing - Progress: Other (comment) Pt Will Perform Lower Body Bathing: with supervision;Sit to stand from bed (with AE as needed) ADL Goal: Lower Body Bathing - Progress: Other (comment) Pt Will Perform Upper Body Dressing: with caregiver independent in assisting;Sitting, bed;Other (comment) (R sling; following all shoulder precautions) ADL Goal: Upper Body Dressing - Progress: Other (comment) Pt Will Perform Lower Body Dressing: with supervision;with adaptive equipment;Sit to stand from chair ADL Goal: Lower Body Dressing - Progress: Other (comment) Pt Will Transfer to Toilet: with supervision;Ambulation ADL Goal: Toilet Transfer - Progress: Other (comment) Pt Will Perform Toileting - Clothing Manipulation: with supervision;with adaptive equipment;Sitting on 3-in-1 or toilet;Standing ADL Goal: Toileting - Clothing Manipulation - Progress: Other (comment) Pt Will Perform Toileting - Hygiene: with supervision;Sit to stand from 3-in-1/toilet;with adaptive equipment ADL Goal: Toileting - Hygiene - Progress: Other (comment) Arm Goals Pt Will Perform AROM: Right upper extremity;with supervision, verbal cues required/provided;5 reps;Other (comment) (R UE: digits, wrist, elbow) Arm Goal: AROM - Progress: Other (comment) Pt Will Tolerate PROM: Right upper extremity;10 reps;with caregiver independent in performing Arm Goal: PROM - Progress: Other (comment)  OT Evaluation Precautions/Restrictions  Precautions Precautions: Shoulder Type of Shoulder Precautions: No AROM Precaution Booklet Issued: Yes  (comment) Restrictions Weight Bearing Restrictions: No Prior Functioning Home Living Lives With: Spouse Receives Help From:  Family Prior Function Level of Independence: Independent with basic ADLs;Independent with homemaking with ambulation;Independent with gait ADL ADL Eating/Feeding: Simulated;Independent Where Assessed - Eating/Feeding: Edge of bed Grooming: Simulated;Modified independent Where Assessed - Grooming: Sitting, bed Upper Body Bathing: Simulated;Minimal assistance Upper Body Bathing Details (indicate cue type and reason): Min assist due to limited use of R UE Where Assessed - Upper Body Bathing: Sitting, bed Lower Body Bathing: Simulated;Moderate assistance Lower Body Bathing Details (indicate cue type and reason): Mod assist due to limited use of R UE. Where Assessed - Lower Body Bathing: Sit to stand from bed Upper Body Dressing: Simulated;Moderate assistance Upper Body Dressing Details (indicate cue type and reason): VC to relax R shoulder. Where Assessed - Upper Body Dressing: Sitting, bed Lower Body Dressing: Simulated;Moderate assistance Where Assessed - Lower Body Dressing: Sit to stand from bed Toilet Transfer: Simulated;Other (comment) (min guard assist) Toilet Transfer Method: Ambulating Toileting - Clothing Manipulation: Simulated;Minimal assistance Toileting - Clothing Manipulation Details (indicate cue type and reason): Min assist due to limited movement of R  UE Where Assessed - Toileting Clothing Manipulation: Standing Toileting - Hygiene: Simulated;Supervision/safety Toileting - Hygiene Details (indicate cue type and reason): Pt with limited movement in L UE for back peri hygiene. (Educated pt on use of toilet aid for toilet hygiene.) Where Assessed - Toileting Hygiene: Standing Tub/Shower Transfer: Not assessed Equipment Used: Other (comment) (toilet aid) ADL Comments: Pt with decreased I with BADLs due to limited use of R UE. (Pt's dominant hand is  R hand.  Pt adapting with L UE.) Vision/Perception    Cognition Cognition Arousal/Alertness: Awake/alert Overall Cognitive Status: Appears within functional limits for tasks assessed Orientation Level: Oriented X4 Sensation/Coordination   Extremity Assessment RUE Assessment RUE Assessment: Exceptions to [facility] RUE AROM (degrees) RUE Overall AROM Comments: No shoulder AROM.  WFL grossly in elbow, wrist, and digits. LUE Assessment LUE Assessment: Exceptions to [facility] LUE AROM (degrees) Left Shoulder Extension  0-60: 50 Degrees (Pt demonstrates functional limitation with back peri care.) Mobility  Bed Mobility Bed Mobility: Yes Supine to Sit: 4: Min assist Supine to Sit Details (indicate cue type and reason): Min assist for support to trunk. Transfers Transfers: Yes Sit to Stand: Other (comment);From bed (min guard) Stand to Sit: Other (comment);To bed (min guard) Exercises Shoulder Exercises Pendulum Exercise: PROM;Right;5 reps;Standing Shoulder Flexion: PROM;5 reps;Seated;Right Shoulder ABduction: PROM;5 reps;Seated;Right Shoulder External Rotation: PROM;5 reps;Seated;Right Elbow Flexion: AROM;Right;Seated;5 reps Elbow Extension: AROM;Right;5 reps;Seated Wrist Flexion: AROM;Right;5 reps;Seated Wrist Extension: AROM;Right;5 reps;Seated Digit Composite Flexion: AROM;5 reps;Seated Composite Extension: AROM;Right;5 reps;Seated End of Session OT - End of Session Activity Tolerance: Patient limited by pain;Patient limited by fatigue Patient left: in bed;with call bell in reach;with family/visitor present General Behavior During Session: [facility] for tasks performed Cognition: [facility] for tasks performed   Cipriano Mile 10/11/2011, 12:55 PM  10/11/2011 Cipriano Mile OTR/L Pager [phone] Office [phone]

## 2011-10-12 NOTE — Discharge Summary (Signed)
  Ralene Bathe, PA Physician Assistant Signed  Progress Notes 10/11/2011 8:22 AM  Subjective:  1 Day Post-Op Procedure(s) (LRB):  TOTAL SHOULDER ARTHROPLASTY (Right)  Patient reports pain as mild.  Overall doing well  Objective:  Vital signs in last 24 hours:  Temp: [96.9 F (36.1 C)-99.4 F (37.4 C)] 97.6 F (36.4 C) (11/09 0640)  Pulse Rate: [73-94] 94 (11/09 0640)  Resp: [13-22] 16 (11/09 0640)  BP: (99-148)/(64-82) 148/74 mmHg (11/09 0640)  SpO2: [96 %-100 %] 97 % (11/09 0640)  FiO2 (%): [3 %] 3 % (11/08 1500)  Weight: [84 kg (185 lb 3 oz)] 185 lb 3 oz (84 kg) (11/09 0212)  Intake/Output from previous day:  11/08 0701 - 11/09 0700  In: 4873.8 [P.O.:960; I.V.:3413.8; IV Piggyback:500]  Out: 651 [Urine:1; Blood:650]  Intake/Output this shift:      Basename  10/11/11 0459    HGB  9.3*        Basename  10/11/11 0459    WBC  --    RBC  --    HCT  27.3*    PLT  --     No results found for this basename: NA:2,K:2,CL:2,CO2:2,BUN:2,CREATININE:2,GLUCOSE:2,CALCIUM:2 in the last 72 hours  No results found for this basename: LABPT:2,INR:2 in the last 72 hours  Sensation intact distally  Compartment soft. Incision dry.  Assessment/Plan:  1 Day Post-Op Procedure(s) (LRB):  TOTAL SHOULDER ARTHROPLASTY (Right)  D/C IV fluids  Discharge home with home health  COD: Stable and Improved  Damian Buckles  10/11/2011, 8:22 AM

## 2011-10-16 ENCOUNTER — Encounter (HOSPITAL_COMMUNITY): Payer: Self-pay | Admitting: Orthopedic Surgery

## 2011-10-16 MED FILL — Thrombin For Soln Kit 5000 Unit: CUTANEOUS | Qty: 1 | Status: AC

## 2011-11-12 ENCOUNTER — Ambulatory Visit: Payer: Medicare Other | Attending: Orthopedic Surgery | Admitting: Physical Therapy

## 2011-11-12 DIAGNOSIS — IMO0001 Reserved for inherently not codable concepts without codable children: Secondary | ICD-10-CM | POA: Insufficient documentation

## 2011-11-12 DIAGNOSIS — M25619 Stiffness of unspecified shoulder, not elsewhere classified: Secondary | ICD-10-CM | POA: Insufficient documentation

## 2011-11-12 DIAGNOSIS — M25519 Pain in unspecified shoulder: Secondary | ICD-10-CM | POA: Insufficient documentation

## 2011-11-15 ENCOUNTER — Ambulatory Visit: Payer: Medicare Other | Admitting: Physical Therapy

## 2011-11-19 ENCOUNTER — Ambulatory Visit: Payer: Medicare Other | Admitting: Physical Therapy

## 2011-11-21 ENCOUNTER — Ambulatory Visit: Payer: Medicare Other | Admitting: Physical Therapy

## 2011-11-27 ENCOUNTER — Ambulatory Visit: Payer: Medicare Other | Admitting: Physical Therapy

## 2011-11-29 ENCOUNTER — Ambulatory Visit: Payer: Medicare Other | Admitting: Physical Therapy

## 2011-12-02 ENCOUNTER — Ambulatory Visit: Payer: Medicare Other | Admitting: Physical Therapy

## 2011-12-04 ENCOUNTER — Encounter: Payer: Medicare Other | Admitting: Physical Therapy

## 2011-12-06 ENCOUNTER — Ambulatory Visit: Payer: Medicare Other | Attending: Orthopedic Surgery | Admitting: Physical Therapy

## 2011-12-06 DIAGNOSIS — M25519 Pain in unspecified shoulder: Secondary | ICD-10-CM | POA: Diagnosis not present

## 2011-12-06 DIAGNOSIS — M25619 Stiffness of unspecified shoulder, not elsewhere classified: Secondary | ICD-10-CM | POA: Insufficient documentation

## 2011-12-06 DIAGNOSIS — IMO0001 Reserved for inherently not codable concepts without codable children: Secondary | ICD-10-CM | POA: Diagnosis not present

## 2011-12-09 ENCOUNTER — Ambulatory Visit: Payer: Medicare Other | Admitting: Physical Therapy

## 2011-12-11 ENCOUNTER — Ambulatory Visit: Payer: Medicare Other | Admitting: Physical Therapy

## 2011-12-13 ENCOUNTER — Ambulatory Visit: Payer: Medicare Other | Admitting: Physical Therapy

## 2011-12-17 ENCOUNTER — Ambulatory Visit: Payer: Medicare Other | Admitting: Physical Therapy

## 2011-12-19 ENCOUNTER — Ambulatory Visit: Payer: Medicare Other | Admitting: Physical Therapy

## 2011-12-23 ENCOUNTER — Ambulatory Visit: Payer: Medicare Other | Admitting: Physical Therapy

## 2011-12-25 ENCOUNTER — Ambulatory Visit: Payer: Medicare Other | Admitting: Physical Therapy

## 2011-12-27 ENCOUNTER — Ambulatory Visit: Payer: Medicare Other | Admitting: *Deleted

## 2011-12-30 ENCOUNTER — Ambulatory Visit: Payer: Medicare Other | Admitting: Physical Therapy

## 2012-01-01 ENCOUNTER — Other Ambulatory Visit: Payer: Self-pay | Admitting: Internal Medicine

## 2012-01-01 ENCOUNTER — Ambulatory Visit: Payer: Medicare Other | Admitting: Physical Therapy

## 2012-01-03 ENCOUNTER — Ambulatory Visit: Payer: Medicare Other | Attending: Orthopedic Surgery | Admitting: Physical Therapy

## 2012-01-03 DIAGNOSIS — M25619 Stiffness of unspecified shoulder, not elsewhere classified: Secondary | ICD-10-CM | POA: Insufficient documentation

## 2012-01-03 DIAGNOSIS — IMO0001 Reserved for inherently not codable concepts without codable children: Secondary | ICD-10-CM | POA: Insufficient documentation

## 2012-01-03 DIAGNOSIS — M25519 Pain in unspecified shoulder: Secondary | ICD-10-CM | POA: Diagnosis not present

## 2012-01-07 ENCOUNTER — Ambulatory Visit: Payer: Medicare Other | Admitting: Physical Therapy

## 2012-01-09 ENCOUNTER — Ambulatory Visit: Payer: Medicare Other | Admitting: Physical Therapy

## 2012-01-14 ENCOUNTER — Ambulatory Visit: Payer: Medicare Other | Admitting: Physical Therapy

## 2012-01-16 ENCOUNTER — Ambulatory Visit: Payer: Medicare Other | Admitting: Physical Therapy

## 2012-01-21 ENCOUNTER — Ambulatory Visit: Payer: Medicare Other | Admitting: Physical Therapy

## 2012-01-23 ENCOUNTER — Ambulatory Visit: Payer: Medicare Other | Admitting: Physical Therapy

## 2012-01-28 ENCOUNTER — Ambulatory Visit: Payer: Medicare Other | Admitting: Physical Therapy

## 2012-01-30 ENCOUNTER — Ambulatory Visit: Payer: Medicare Other | Admitting: Physical Therapy

## 2012-02-04 ENCOUNTER — Ambulatory Visit: Payer: Medicare Other | Attending: Orthopedic Surgery | Admitting: Physical Therapy

## 2012-02-04 DIAGNOSIS — M25519 Pain in unspecified shoulder: Secondary | ICD-10-CM | POA: Diagnosis not present

## 2012-02-04 DIAGNOSIS — IMO0001 Reserved for inherently not codable concepts without codable children: Secondary | ICD-10-CM | POA: Diagnosis not present

## 2012-02-04 DIAGNOSIS — M25619 Stiffness of unspecified shoulder, not elsewhere classified: Secondary | ICD-10-CM | POA: Insufficient documentation

## 2012-02-06 ENCOUNTER — Ambulatory Visit: Payer: Medicare Other | Admitting: Physical Therapy

## 2012-06-30 ENCOUNTER — Other Ambulatory Visit: Payer: Self-pay | Admitting: Internal Medicine

## 2012-07-11 DIAGNOSIS — M76899 Other specified enthesopathies of unspecified lower limb, excluding foot: Secondary | ICD-10-CM | POA: Diagnosis not present

## 2012-09-04 ENCOUNTER — Telehealth: Payer: Self-pay | Admitting: Internal Medicine

## 2012-09-04 ENCOUNTER — Ambulatory Visit (INDEPENDENT_AMBULATORY_CARE_PROVIDER_SITE_OTHER): Payer: Medicare Other | Admitting: Internal Medicine

## 2012-09-04 ENCOUNTER — Other Ambulatory Visit (INDEPENDENT_AMBULATORY_CARE_PROVIDER_SITE_OTHER): Payer: Medicare Other

## 2012-09-04 ENCOUNTER — Encounter: Payer: Self-pay | Admitting: Internal Medicine

## 2012-09-04 VITALS — BP 130/88 | HR 67 | Temp 97.4°F | Resp 16 | Wt 192.2 lb

## 2012-09-04 DIAGNOSIS — E785 Hyperlipidemia, unspecified: Secondary | ICD-10-CM

## 2012-09-04 DIAGNOSIS — Z0000 Encounter for general adult medical examination without abnormal findings: Secondary | ICD-10-CM | POA: Diagnosis not present

## 2012-09-04 DIAGNOSIS — I10 Essential (primary) hypertension: Secondary | ICD-10-CM | POA: Diagnosis not present

## 2012-09-04 DIAGNOSIS — Z23 Encounter for immunization: Secondary | ICD-10-CM | POA: Diagnosis not present

## 2012-09-04 DIAGNOSIS — K219 Gastro-esophageal reflux disease without esophagitis: Secondary | ICD-10-CM

## 2012-09-04 DIAGNOSIS — D510 Vitamin B12 deficiency anemia due to intrinsic factor deficiency: Secondary | ICD-10-CM

## 2012-09-04 DIAGNOSIS — M1990 Unspecified osteoarthritis, unspecified site: Secondary | ICD-10-CM | POA: Diagnosis not present

## 2012-09-04 LAB — COMPREHENSIVE METABOLIC PANEL
ALT: 32 U/L (ref 0–35)
AST: 27 U/L (ref 0–37)
CO2: 29 mEq/L (ref 19–32)
Creatinine, Ser: 0.7 mg/dL (ref 0.4–1.2)
GFR: 82.5 mL/min (ref >60.00)
Total Bilirubin: 1 mg/dL (ref 0.3–1.2)

## 2012-09-04 LAB — CBC WITH DIFFERENTIAL/PLATELET
Basophils Absolute: 0 10*3/uL (ref 0.0–0.1)
HCT: 40.7 % (ref 36.0–46.0)
Hemoglobin: 13.6 g/dL (ref 12.0–15.0)
Lymphs Abs: 2.4 10*3/uL (ref 0.7–4.0)
Monocytes Relative: 9.3 % (ref 3.0–12.0)
Neutro Abs: 3.2 10*3/uL (ref 1.4–7.7)
RDW: 13.6 % (ref 11.5–14.6)

## 2012-09-04 LAB — LIPID PANEL
Cholesterol: 202 mg/dL — ABNORMAL HIGH (ref 0–200)
Total CHOL/HDL Ratio: 3
Triglycerides: 132 mg/dL (ref 0.0–149.0)
VLDL: 26.4 mg/dL (ref 0.0–40.0)

## 2012-09-04 LAB — LDL CHOLESTEROL, DIRECT: Direct LDL: 119.5 mg/dL

## 2012-09-04 LAB — IBC PANEL
Iron: 69 ug/dL (ref 42–145)
Saturation Ratios: 17.4 % — ABNORMAL LOW (ref 20.0–50.0)
Transferrin: 282.7 mg/dL (ref 212.0–360.0)

## 2012-09-04 MED ORDER — HYDROCODONE-ACETAMINOPHEN 7.5-500 MG PO TABS: 1.0000 | ORAL_TABLET | Freq: Three times a day (TID) | ORAL | Status: DC | PRN

## 2012-09-04 MED ORDER — OMEPRAZOLE 40 MG PO CPDR: 40.0000 mg | DELAYED_RELEASE_CAPSULE | Freq: Every day | ORAL | Status: DC

## 2012-09-04 NOTE — Telephone Encounter (Signed)
Caller: Navya/Patient; ; PCP: Sanda Linger (Adults only); Best Callback Phone Number: [phone], patient calling following exam in office 09/04/12, states pharmacy didn't receive the RX for Hydrocodone, advised per EPIC, RX was to have been printed and given to her. Patient found her RX.

## 2012-09-04 NOTE — Progress Notes (Signed)
Subjective:    Patient ID: Veronica Garza, female    DOB: [birth year], 71 y.o.   MRN: [id number]  Gastrophageal Reflux She complains of heartburn. She reports no abdominal pain, no belching, no chest pain, no choking, no coughing, no dysphagia, no early satiety, no globus sensation, no hoarse voice, no nausea, no sore throat, no stridor, no tooth decay, no water brash or no wheezing. This is a chronic problem. The current episode started more than 1 year ago. The problem has been unchanged. The heartburn duration is several minutes. The heartburn is located in the substernum. The heartburn is of mild intensity. The heartburn does not wake her from sleep. The heartburn does not limit her activity. The heartburn doesn't change with position. Pertinent negatives include no anemia, fatigue, melena or orthopnea. She has tried a PPI for the symptoms. The treatment provided moderate relief. Past procedures include an EGD.      Review of Systems  Constitutional: Negative for fever, chills, diaphoresis, activity change, appetite change, fatigue and unexpected weight change.  HENT: Negative.  Negative for sore throat and hoarse voice.   Eyes: Negative.   Respiratory: Negative for cough, choking, chest tightness, shortness of breath, wheezing and stridor.   Cardiovascular: Negative for chest pain, palpitations and leg swelling.  Gastrointestinal: Positive for heartburn. Negative for dysphagia, nausea, vomiting, abdominal pain, diarrhea, constipation, blood in stool, melena and abdominal distention.  Genitourinary: Negative.   Musculoskeletal: Positive for arthralgias. Negative for myalgias, back pain, joint swelling and gait problem.  Skin: Negative for color change, pallor, rash and wound.  Neurological: Negative.   Hematological: Negative for adenopathy. Does not bruise/bleed easily.  Psychiatric/Behavioral: Negative.        Objective:   Physical Exam  Vitals reviewed. Constitutional: She is  oriented to person, place, and time. She appears well-developed and well-nourished. No distress.  HENT:  Head: Normocephalic and atraumatic.  Mouth/Throat: Oropharynx is clear and moist. No oropharyngeal exudate.  Eyes: Conjunctivae normal are normal. Right eye exhibits no discharge. Left eye exhibits no discharge. No scleral icterus.  Neck: Normal range of motion. Neck supple. No JVD present. No tracheal deviation present. No thyromegaly present.  Cardiovascular: Normal rate, regular rhythm, normal heart sounds and intact distal pulses.  Exam reveals no gallop and no friction rub.   No murmur heard. Pulmonary/Chest: Effort normal and breath sounds normal. No stridor. No respiratory distress. She has no wheezes. She has no rales. She exhibits no tenderness.  Abdominal: Soft. Bowel sounds are normal. She exhibits no distension and no mass. There is no tenderness. There is no rebound and no guarding.  Genitourinary: Rectal exam shows external hemorrhoid and internal hemorrhoid. Rectal exam shows no fissure, no tenderness and anal tone normal. Guaiac negative stool. No breast swelling, tenderness, discharge or bleeding. Pelvic exam was performed with patient supine. Right adnexum displays no mass, no tenderness and no fullness. Left adnexum displays no mass, no tenderness and no fullness.  Musculoskeletal: Normal range of motion. She exhibits no edema and no tenderness.  Lymphadenopathy:    She has no cervical adenopathy.  Neurological: She is alert and oriented to person, place, and time. She has normal reflexes. She displays normal reflexes. No cranial nerve deficit. She exhibits normal muscle tone. Coordination normal.  Skin: Skin is warm and dry. No rash noted. She is not diaphoretic. No erythema. No pallor.  Psychiatric: She has a normal mood and affect. Her behavior is normal. Judgment and thought content normal.  Lab Results  Component Value Date   WBC 7.6 09/30/2011   HGB 9.3*  10/11/2011   HCT 27.3* 10/11/2011   PLT 209 09/30/2011   GLUCOSE 96 09/30/2011   CHOL 188 08/08/2011   TRIG 57.0 08/08/2011   HDL 69.70 08/08/2011   LDLCALC 107* 08/08/2011   ALT 25 09/30/2011   AST 25 09/30/2011   NA 141 09/30/2011   K 4.3 09/30/2011   CL 103 09/30/2011   CREATININE 0.76 09/30/2011   BUN 12 09/30/2011   CO2 28 09/30/2011   TSH 2.14 08/08/2011   INR 0.93 09/30/2011      Assessment & Plan:

## 2012-09-04 NOTE — Patient Instructions (Signed)
Preventive Care for Adults, Female A healthy lifestyle and preventive care can promote health and wellness. Preventive health guidelines for women include the following key practices.  A routine yearly physical is a good way to check with your caregiver about your health and preventive screening. It is a chance to share any concerns and updates on your health, and to receive a thorough exam.  Visit your dentist for a routine exam and preventive care every 6 months. Brush your teeth twice a day and floss once a day. Good oral hygiene prevents tooth decay and gum disease.  The frequency of eye exams is based on your age, health, family medical history, use of contact lenses, and other factors. Follow your caregiver's recommendations for frequency of eye exams.  Eat a healthy diet. Foods like vegetables, fruits, whole grains, low-fat dairy products, and lean protein foods contain the nutrients you need without too many calories. Decrease your intake of foods high in solid fats, added sugars, and salt. Eat the right amount of calories for you.Get information about a proper diet from your caregiver, if necessary.  Regular physical exercise is one of the most important things you can do for your health. Most adults should get at least 150 minutes of moderate-intensity exercise (any activity that increases your heart rate and causes you to sweat) each week. In addition, most adults need muscle-strengthening exercises on 2 or more days a week.  Maintain a healthy weight. The body mass index (BMI) is a screening tool to identify possible weight problems. It provides an estimate of body fat based on height and weight. Your caregiver can help determine your BMI, and can help you achieve or maintain a healthy weight.For adults 20 years and older:  A BMI below 18.5 is considered underweight.  A BMI of 18.5 to 24.9 is normal.  A BMI of 25 to 29.9 is considered overweight.  A BMI of 30 and above is  considered obese.  Maintain normal blood lipids and cholesterol levels by exercising and minimizing your intake of saturated fat. Eat a balanced diet with plenty of fruit and vegetables. Blood tests for lipids and cholesterol should begin at age 20 and be repeated every 5 years. If your lipid or cholesterol levels are high, you are over 50, or you are at high risk for heart disease, you may need your cholesterol levels checked more frequently.Ongoing high lipid and cholesterol levels should be treated with medicines if diet and exercise are not effective.  If you smoke, find out from your caregiver how to quit. If you do not use tobacco, do not start.  If you are pregnant, do not drink alcohol. If you are breastfeeding, be very cautious about drinking alcohol. If you are not pregnant and choose to drink alcohol, do not exceed 1 drink per day. One drink is considered to be 12 ounces (355 mL) of beer, 5 ounces (148 mL) of wine, or 1.5 ounces (44 mL) of liquor.  Avoid use of street drugs. Do not share needles with anyone. Ask for help if you need support or instructions about stopping the use of drugs.  High blood pressure causes heart disease and increases the risk of stroke. Your blood pressure should be checked at least every 1 to 2 years. Ongoing high blood pressure should be treated with medicines if weight loss and exercise are not effective.  If you are 55 to 70 years old, ask your caregiver if you should take aspirin to prevent strokes.  Diabetes   screening involves taking a blood sample to check your fasting blood sugar level. This should be done once every 3 years, after age 45, if you are within normal weight and without risk factors for diabetes. Testing should be considered at a younger age or be carried out more frequently if you are overweight and have at least 1 risk factor for diabetes.  Breast cancer screening is essential preventive care for women. You should practice "breast  self-awareness." This means understanding the normal appearance and feel of your breasts and may include breast self-examination. Any changes detected, no matter how small, should be reported to a caregiver. Women in their 20s and 30s should have a clinical breast exam (CBE) by a caregiver as part of a regular health exam every 1 to 3 years. After age 40, women should have a CBE every year. Starting at age 40, women should consider having a mammography (breast X-ray test) every year. Women who have a family history of breast cancer should talk to their caregiver about genetic screening. Women at a high risk of breast cancer should talk to their caregivers about having magnetic resonance imaging (MRI) and a mammography every year.  The Pap test is a screening test for cervical cancer. A Pap test can show cell changes on the cervix that might become cervical cancer if left untreated. A Pap test is a procedure in which cells are obtained and examined from the lower end of the uterus (cervix).  Women should have a Pap test starting at age 21.  Between ages 21 and 29, Pap tests should be repeated every 2 years.  Beginning at age 30, you should have a Pap test every 3 years as long as the past 3 Pap tests have been normal.  Some women have medical problems that increase the chance of getting cervical cancer. Talk to your caregiver about these problems. It is especially important to talk to your caregiver if a new problem develops soon after your last Pap test. In these cases, your caregiver may recommend more frequent screening and Pap tests.  The above recommendations are the same for women who have or have not gotten the vaccine for human papillomavirus (HPV).  If you had a hysterectomy for a problem that was not cancer or a condition that could lead to cancer, then you no longer need Pap tests. Even if you no longer need a Pap test, a regular exam is a good idea to make sure no other problems are  starting.  If you are between ages 65 and 70, and you have had normal Pap tests going back 10 years, you no longer need Pap tests. Even if you no longer need a Pap test, a regular exam is a good idea to make sure no other problems are starting.  If you have had past treatment for cervical cancer or a condition that could lead to cancer, you need Pap tests and screening for cancer for at least 20 years after your treatment.  If Pap tests have been discontinued, risk factors (such as a new sexual partner) need to be reassessed to determine if screening should be resumed.  The HPV test is an additional test that may be used for cervical cancer screening. The HPV test looks for the virus that can cause the cell changes on the cervix. The cells collected during the Pap test can be tested for HPV. The HPV test could be used to screen women aged 30 years and older, and should   be used in women of any age who have unclear Pap test results. After the age of 30, women should have HPV testing at the same frequency as a Pap test.  Colorectal cancer can be detected and often prevented. Most routine colorectal cancer screening begins at the age of 50 and continues through age 75. However, your caregiver may recommend screening at an earlier age if you have risk factors for colon cancer. On a yearly basis, your caregiver may provide home test kits to check for hidden blood in the stool. Use of a small camera at the end of a tube, to directly examine the colon (sigmoidoscopy or colonoscopy), can detect the earliest forms of colorectal cancer. Talk to your caregiver about this at age 50, when routine screening begins. Direct examination of the colon should be repeated every 5 to 10 years through age 75, unless early forms of pre-cancerous polyps or small growths are found.  Hepatitis C blood testing is recommended for all people born from 1945 through 1965 and any individual with known risks for hepatitis C.  Practice  safe sex. Use condoms and avoid high-risk sexual practices to reduce the spread of sexually transmitted infections (STIs). STIs include gonorrhea, chlamydia, syphilis, trichomonas, herpes, HPV, and human immunodeficiency virus (HIV). Herpes, HIV, and HPV are viral illnesses that have no cure. They can result in disability, cancer, and death. Sexually active women aged 25 and younger should be checked for chlamydia. Older women with new or multiple partners should also be tested for chlamydia. Testing for other STIs is recommended if you are sexually active and at increased risk.  Osteoporosis is a disease in which the bones lose minerals and strength with aging. This can result in serious bone fractures. The risk of osteoporosis can be identified using a bone density scan. Women ages 65 and over and women at risk for fractures or osteoporosis should discuss screening with their caregivers. Ask your caregiver whether you should take a calcium supplement or vitamin D to reduce the rate of osteoporosis.  Menopause can be associated with physical symptoms and risks. Hormone replacement therapy is available to decrease symptoms and risks. You should talk to your caregiver about whether hormone replacement therapy is right for you.  Use sunscreen with sun protection factor (SPF) of 30 or more. Apply sunscreen liberally and repeatedly throughout the day. You should seek shade when your shadow is shorter than you. Protect yourself by wearing long sleeves, pants, a wide-brimmed hat, and sunglasses year round, whenever you are outdoors.  Once a month, do a whole body skin exam, using a mirror to look at the skin on your back. Notify your caregiver of new moles, moles that have irregular borders, moles that are larger than a pencil eraser, or moles that have changed in shape or color.  Stay current with required immunizations.  Influenza. You need a dose every fall (or winter). The composition of the flu vaccine  changes each year, so being vaccinated once is not enough.  Pneumococcal polysaccharide. You need 1 to 2 doses if you smoke cigarettes or if you have certain chronic medical conditions. You need 1 dose at age 65 (or older) if you have never been vaccinated.  Tetanus, diphtheria, pertussis (Tdap, Td). Get 1 dose of Tdap vaccine if you are younger than age 65, are over 65 and have contact with an infant, are a healthcare worker, are pregnant, or simply want to be protected from whooping cough. After that, you need a Td   booster dose every 10 years. Consult your caregiver if you have not had at least 3 tetanus and diphtheria-containing shots sometime in your life or have a deep or dirty wound.  HPV. You need this vaccine if you are a woman age 26 or younger. The vaccine is given in 3 doses over 6 months.  Measles, mumps, rubella (MMR). You need at least 1 dose of MMR if you were born in 1957 or later. You may also need a second dose.  Meningococcal. If you are age 19 to 21 and a first-year college student living in a residence hall, or have one of several medical conditions, you need to get vaccinated against meningococcal disease. You may also need additional booster doses.  Zoster (shingles). If you are age 60 or older, you should get this vaccine.  Varicella (chickenpox). If you have never had chickenpox or you were vaccinated but received only 1 dose, talk to your caregiver to find out if you need this vaccine.  Hepatitis A. You need this vaccine if you have a specific risk factor for hepatitis A virus infection or you simply wish to be protected from this disease. The vaccine is usually given as 2 doses, 6 to 18 months apart.  Hepatitis B. You need this vaccine if you have a specific risk factor for hepatitis B virus infection or you simply wish to be protected from this disease. The vaccine is given in 3 doses, usually over 6 months. Preventive Services / Frequency Ages 19 to 39  Blood  pressure check.** / Every 1 to 2 years.  Lipid and cholesterol check.** / Every 5 years beginning at age 20.  Clinical breast exam.** / Every 3 years for women in their 20s and 30s.  Pap test.** / Every 2 years from ages 21 through 29. Every 3 years starting at age 30 through age 65 or 70 with a history of 3 consecutive normal Pap tests.  HPV screening.** / Every 3 years from ages 30 through ages 65 to 70 with a history of 3 consecutive normal Pap tests.  Hepatitis C blood test.** / For any individual with known risks for hepatitis C.  Skin self-exam. / Monthly.  Influenza immunization.** / Every year.  Pneumococcal polysaccharide immunization.** / 1 to 2 doses if you smoke cigarettes or if you have certain chronic medical conditions.  Tetanus, diphtheria, pertussis (Tdap, Td) immunization. / A one-time dose of Tdap vaccine. After that, you need a Td booster dose every 10 years.  HPV immunization. / 3 doses over 6 months, if you are 26 and younger.  Measles, mumps, rubella (MMR) immunization. / You need at least 1 dose of MMR if you were born in 1957 or later. You may also need a second dose.  Meningococcal immunization. / 1 dose if you are age 19 to 21 and a first-year college student living in a residence hall, or have one of several medical conditions, you need to get vaccinated against meningococcal disease. You may also need additional booster doses.  Varicella immunization.** / Consult your caregiver.  Hepatitis A immunization.** / Consult your caregiver. 2 doses, 6 to 18 months apart.  Hepatitis B immunization.** / Consult your caregiver. 3 doses usually over 6 months. Ages 40 to 64  Blood pressure check.** / Every 1 to 2 years.  Lipid and cholesterol check.** / Every 5 years beginning at age 20.  Clinical breast exam.** / Every year after age 40.  Mammogram.** / Every year beginning at age 40   and continuing for as long as you are in good health. Consult with your  caregiver.  Pap test.** / Every 3 years starting at age 30 through age 65 or 70 with a history of 3 consecutive normal Pap tests.  HPV screening.** / Every 3 years from ages 30 through ages 65 to 70 with a history of 3 consecutive normal Pap tests.  Fecal occult blood test (FOBT) of stool. / Every year beginning at age 50 and continuing until age 75. You may not need to do this test if you get a colonoscopy every 10 years.  Flexible sigmoidoscopy or colonoscopy.** / Every 5 years for a flexible sigmoidoscopy or every 10 years for a colonoscopy beginning at age 50 and continuing until age 75.  Hepatitis C blood test.** / For all people born from 1945 through 1965 and any individual with known risks for hepatitis C.  Skin self-exam. / Monthly.  Influenza immunization.** / Every year.  Pneumococcal polysaccharide immunization.** / 1 to 2 doses if you smoke cigarettes or if you have certain chronic medical conditions.  Tetanus, diphtheria, pertussis (Tdap, Td) immunization.** / A one-time dose of Tdap vaccine. After that, you need a Td booster dose every 10 years.  Measles, mumps, rubella (MMR) immunization. / You need at least 1 dose of MMR if you were born in 1957 or later. You may also need a second dose.  Varicella immunization.** / Consult your caregiver.  Meningococcal immunization.** / Consult your caregiver.  Hepatitis A immunization.** / Consult your caregiver. 2 doses, 6 to 18 months apart.  Hepatitis B immunization.** / Consult your caregiver. 3 doses, usually over 6 months. Ages 65 and over  Blood pressure check.** / Every 1 to 2 years.  Lipid and cholesterol check.** / Every 5 years beginning at age 20.  Clinical breast exam.** / Every year after age 40.  Mammogram.** / Every year beginning at age 40 and continuing for as long as you are in good health. Consult with your caregiver.  Pap test.** / Every 3 years starting at age 30 through age 65 or 70 with a 3  consecutive normal Pap tests. Testing can be stopped between 65 and 70 with 3 consecutive normal Pap tests and no abnormal Pap or HPV tests in the past 10 years.  HPV screening.** / Every 3 years from ages 30 through ages 65 or 70 with a history of 3 consecutive normal Pap tests. Testing can be stopped between 65 and 70 with 3 consecutive normal Pap tests and no abnormal Pap or HPV tests in the past 10 years.  Fecal occult blood test (FOBT) of stool. / Every year beginning at age 50 and continuing until age 75. You may not need to do this test if you get a colonoscopy every 10 years.  Flexible sigmoidoscopy or colonoscopy.** / Every 5 years for a flexible sigmoidoscopy or every 10 years for a colonoscopy beginning at age 50 and continuing until age 75.  Hepatitis C blood test.** / For all people born from 1945 through 1965 and any individual with known risks for hepatitis C.  Osteoporosis screening.** / A one-time screening for women ages 65 and over and women at risk for fractures or osteoporosis.  Skin self-exam. / Monthly.  Influenza immunization.** / Every year.  Pneumococcal polysaccharide immunization.** / 1 dose at age 65 (or older) if you have never been vaccinated.  Tetanus, diphtheria, pertussis (Tdap, Td) immunization. / A one-time dose of Tdap vaccine if you are over   65 and have contact with an infant, are a Research scientist (physical sciences), or simply want to be protected from whooping cough. After that, you need a Td booster dose every 10 years.  Varicella immunization.** / Consult your caregiver.  Meningococcal immunization.** / Consult your caregiver.  Hepatitis A immunization.** / Consult your caregiver. 2 doses, 6 to 18 months apart.  Hepatitis B immunization.** / Check with your caregiver. 3 doses, usually over 6 months. ** Family history and personal history of risk and conditions may change your caregiver's recommendations. Document Released: 01/14/2002 Document Revised: 02/10/2012  Document Reviewed: 04/15/2011 [facility] Patient Information 2013 [place], [place]. Osteoarthritis Osteoarthritis is the most common form of arthritis. It is redness, soreness, and swelling (inflammation) affecting the cartilage. Cartilage acts as a cushion, covering the ends of bones where they meet to form a joint. CAUSES  Over time, the cartilage begins to wear away. This causes bone to rub on bone. This produces pain and stiffness in the affected joints. Factors that contribute to this problem are:  Excessive body weight.  Age.  Overuse of joints. SYMPTOMS   People with osteoarthritis usually experience joint pain, swelling, or stiffness.  Over time, the joint may lose its normal shape.  Small deposits of bone (osteophytes) may grow on the edges of the joint.  Bits of bone or cartilage can break off and float inside the joint space. This may cause more pain and damage.  Osteoarthritis can lead to depression, anxiety, feelings of helplessness, and limitations on daily activities. The most commonly affected joints are in the:  Ends of the fingers.  Thumbs.  Neck.  Lower back.  Knees.  Hips. DIAGNOSIS  Diagnosis is mostly based on your symptoms and exam. Tests may be helpful, including:  X-rays of the affected joint.  A computerized magnetic scan (MRI).  Blood tests to rule out other types of arthritis.  Joint fluid tests. This involves using a needle to draw fluid from the joint and examining the fluid under a microscope. TREATMENT  Goals of treatment are to control pain, improve joint function, maintain a normal body weight, and maintain a healthy lifestyle. Treatment approaches may include:  A prescribed exercise program with rest and joint relief.  Weight control with nutritional education.  Pain relief techniques such as:  Properly applied heat and cold.  Electric pulses delivered to nerve endings under the skin (transcutaneous electrical nerve stimulation,  TENS).  Massage.  Certain supplements. Ask your caregiver before using any supplements, especially in combination with prescribed drugs.  Medicines to control pain, such as:  Acetaminophen.  Nonsteroidal anti-inflammatory drugs (NSAIDs), such as naproxen.  Narcotic or central-acting agents, such as tramadol. This drug carries a risk of addiction and is generally prescribed for short-term use.  Corticosteroids. These can be given orally or as injection. This is a short-term treatment, not recommended for routine use.  Surgery to reposition the bones and relieve pain (osteotomy) or to remove loose pieces of bone and cartilage. Joint replacement may be needed in advanced states of osteoarthritis. HOME CARE INSTRUCTIONS  Your caregiver can recommend specific types of exercise. These may include:  Strengthening exercises. These are done to strengthen the muscles that support joints affected by arthritis. They can be performed with weights or with exercise bands to add resistance.  Aerobic activities. These are exercises, such as brisk walking or low-impact aerobics, that get your heart pumping. They can help keep your lungs and circulatory system in shape.  Range-of-motion activities. These keep your joints limber.  Balance and agility exercises. These help you maintain daily living skills. Learning about your condition and being actively involved in your care will help improve the course of your osteoarthritis. SEEK MEDICAL CARE IF:   You feel hot or your skin turns red.  You develop a rash in addition to your joint pain.  You have an oral temperature above 102 F (38.9 C). FOR MORE INFORMATION  National Institute of Arthritis and Musculoskeletal and Skin Diseases: www.niams.http://www.myers.net/ [employer] on Aging: https://walker.com/ American College of Rheumatology: www.rheumatology.org Document Released: 11/18/2005 Document Revised: 02/10/2012 Document Reviewed: 03/01/2010 [facility]  Patient Information 2013 [place], [place].

## 2012-09-06 NOTE — Assessment & Plan Note (Signed)
Her BP is well controlled, I will check her lytes and renal function 

## 2012-09-06 NOTE — Assessment & Plan Note (Signed)
CBC and vitamin levels today 

## 2012-09-06 NOTE — Assessment & Plan Note (Signed)
She has had trouble with PPi.s (side effects and cost) so I asked her to try prilosec

## 2012-09-06 NOTE — Assessment & Plan Note (Signed)
She will continue the current regimen for pain relief

## 2012-09-06 NOTE — Assessment & Plan Note (Addendum)

## 2012-09-07 ENCOUNTER — Encounter (INDEPENDENT_AMBULATORY_CARE_PROVIDER_SITE_OTHER): Payer: Medicare Other | Admitting: Ophthalmology

## 2012-09-07 DIAGNOSIS — H3530 Unspecified macular degeneration: Secondary | ICD-10-CM

## 2012-09-07 DIAGNOSIS — H43819 Vitreous degeneration, unspecified eye: Secondary | ICD-10-CM

## 2012-09-10 DIAGNOSIS — M999 Biomechanical lesion, unspecified: Secondary | ICD-10-CM | POA: Diagnosis not present

## 2012-09-10 DIAGNOSIS — M5430 Sciatica, unspecified side: Secondary | ICD-10-CM | POA: Diagnosis not present

## 2012-09-14 DIAGNOSIS — M999 Biomechanical lesion, unspecified: Secondary | ICD-10-CM | POA: Diagnosis not present

## 2012-09-14 DIAGNOSIS — M5430 Sciatica, unspecified side: Secondary | ICD-10-CM | POA: Diagnosis not present

## 2012-09-16 DIAGNOSIS — M5430 Sciatica, unspecified side: Secondary | ICD-10-CM | POA: Diagnosis not present

## 2012-09-16 DIAGNOSIS — M999 Biomechanical lesion, unspecified: Secondary | ICD-10-CM | POA: Diagnosis not present

## 2012-09-17 DIAGNOSIS — M5430 Sciatica, unspecified side: Secondary | ICD-10-CM | POA: Diagnosis not present

## 2012-09-17 DIAGNOSIS — M999 Biomechanical lesion, unspecified: Secondary | ICD-10-CM | POA: Diagnosis not present

## 2012-09-28 DIAGNOSIS — M5430 Sciatica, unspecified side: Secondary | ICD-10-CM | POA: Diagnosis not present

## 2012-09-28 DIAGNOSIS — M999 Biomechanical lesion, unspecified: Secondary | ICD-10-CM | POA: Diagnosis not present

## 2012-09-30 DIAGNOSIS — M999 Biomechanical lesion, unspecified: Secondary | ICD-10-CM | POA: Diagnosis not present

## 2012-09-30 DIAGNOSIS — M5430 Sciatica, unspecified side: Secondary | ICD-10-CM | POA: Diagnosis not present

## 2012-10-01 DIAGNOSIS — M999 Biomechanical lesion, unspecified: Secondary | ICD-10-CM | POA: Diagnosis not present

## 2012-10-01 DIAGNOSIS — M5430 Sciatica, unspecified side: Secondary | ICD-10-CM | POA: Diagnosis not present

## 2012-10-05 DIAGNOSIS — M999 Biomechanical lesion, unspecified: Secondary | ICD-10-CM | POA: Diagnosis not present

## 2012-10-05 DIAGNOSIS — M5430 Sciatica, unspecified side: Secondary | ICD-10-CM | POA: Diagnosis not present

## 2012-10-07 DIAGNOSIS — M5430 Sciatica, unspecified side: Secondary | ICD-10-CM | POA: Diagnosis not present

## 2012-10-07 DIAGNOSIS — M999 Biomechanical lesion, unspecified: Secondary | ICD-10-CM | POA: Diagnosis not present

## 2012-10-08 DIAGNOSIS — M5430 Sciatica, unspecified side: Secondary | ICD-10-CM | POA: Diagnosis not present

## 2012-10-08 DIAGNOSIS — M999 Biomechanical lesion, unspecified: Secondary | ICD-10-CM | POA: Diagnosis not present

## 2012-10-12 DIAGNOSIS — M5430 Sciatica, unspecified side: Secondary | ICD-10-CM | POA: Diagnosis not present

## 2012-10-12 DIAGNOSIS — M999 Biomechanical lesion, unspecified: Secondary | ICD-10-CM | POA: Diagnosis not present

## 2012-10-14 DIAGNOSIS — M999 Biomechanical lesion, unspecified: Secondary | ICD-10-CM | POA: Diagnosis not present

## 2012-10-14 DIAGNOSIS — M5430 Sciatica, unspecified side: Secondary | ICD-10-CM | POA: Diagnosis not present

## 2012-10-15 DIAGNOSIS — M5430 Sciatica, unspecified side: Secondary | ICD-10-CM | POA: Diagnosis not present

## 2012-10-15 DIAGNOSIS — M999 Biomechanical lesion, unspecified: Secondary | ICD-10-CM | POA: Diagnosis not present

## 2012-10-19 DIAGNOSIS — M5430 Sciatica, unspecified side: Secondary | ICD-10-CM | POA: Diagnosis not present

## 2012-10-19 DIAGNOSIS — M999 Biomechanical lesion, unspecified: Secondary | ICD-10-CM | POA: Diagnosis not present

## 2012-10-21 DIAGNOSIS — M999 Biomechanical lesion, unspecified: Secondary | ICD-10-CM | POA: Diagnosis not present

## 2012-10-21 DIAGNOSIS — M5430 Sciatica, unspecified side: Secondary | ICD-10-CM | POA: Diagnosis not present

## 2012-10-22 DIAGNOSIS — M999 Biomechanical lesion, unspecified: Secondary | ICD-10-CM | POA: Diagnosis not present

## 2012-10-22 DIAGNOSIS — M5430 Sciatica, unspecified side: Secondary | ICD-10-CM | POA: Diagnosis not present

## 2012-10-26 DIAGNOSIS — M5430 Sciatica, unspecified side: Secondary | ICD-10-CM | POA: Diagnosis not present

## 2012-10-26 DIAGNOSIS — M999 Biomechanical lesion, unspecified: Secondary | ICD-10-CM | POA: Diagnosis not present

## 2012-10-28 DIAGNOSIS — M5430 Sciatica, unspecified side: Secondary | ICD-10-CM | POA: Diagnosis not present

## 2012-10-28 DIAGNOSIS — M999 Biomechanical lesion, unspecified: Secondary | ICD-10-CM | POA: Diagnosis not present

## 2012-11-02 DIAGNOSIS — M999 Biomechanical lesion, unspecified: Secondary | ICD-10-CM | POA: Diagnosis not present

## 2012-11-02 DIAGNOSIS — M5430 Sciatica, unspecified side: Secondary | ICD-10-CM | POA: Diagnosis not present

## 2012-11-04 DIAGNOSIS — M5430 Sciatica, unspecified side: Secondary | ICD-10-CM | POA: Diagnosis not present

## 2012-11-04 DIAGNOSIS — M999 Biomechanical lesion, unspecified: Secondary | ICD-10-CM | POA: Diagnosis not present

## 2012-11-05 DIAGNOSIS — H26499 Other secondary cataract, unspecified eye: Secondary | ICD-10-CM | POA: Diagnosis not present

## 2012-11-09 DIAGNOSIS — M5430 Sciatica, unspecified side: Secondary | ICD-10-CM | POA: Diagnosis not present

## 2012-11-09 DIAGNOSIS — M999 Biomechanical lesion, unspecified: Secondary | ICD-10-CM | POA: Diagnosis not present

## 2012-11-12 DIAGNOSIS — M999 Biomechanical lesion, unspecified: Secondary | ICD-10-CM | POA: Diagnosis not present

## 2012-11-12 DIAGNOSIS — M5430 Sciatica, unspecified side: Secondary | ICD-10-CM | POA: Diagnosis not present

## 2012-11-16 DIAGNOSIS — M999 Biomechanical lesion, unspecified: Secondary | ICD-10-CM | POA: Diagnosis not present

## 2012-11-16 DIAGNOSIS — M5430 Sciatica, unspecified side: Secondary | ICD-10-CM | POA: Diagnosis not present

## 2012-11-19 ENCOUNTER — Other Ambulatory Visit: Payer: Self-pay | Admitting: Internal Medicine

## 2012-11-23 ENCOUNTER — Other Ambulatory Visit: Payer: Self-pay | Admitting: Internal Medicine

## 2012-11-23 ENCOUNTER — Other Ambulatory Visit: Payer: Self-pay

## 2012-11-23 DIAGNOSIS — M5430 Sciatica, unspecified side: Secondary | ICD-10-CM | POA: Diagnosis not present

## 2012-11-23 DIAGNOSIS — M999 Biomechanical lesion, unspecified: Secondary | ICD-10-CM | POA: Diagnosis not present

## 2012-11-23 MED ORDER — EST ESTROGENS-METHYLTEST 0.625-1.25 MG PO TABS: ORAL_TABLET | ORAL | Status: DC

## 2012-11-30 DIAGNOSIS — M999 Biomechanical lesion, unspecified: Secondary | ICD-10-CM | POA: Diagnosis not present

## 2012-11-30 DIAGNOSIS — M5430 Sciatica, unspecified side: Secondary | ICD-10-CM | POA: Diagnosis not present

## 2012-12-07 DIAGNOSIS — M5430 Sciatica, unspecified side: Secondary | ICD-10-CM | POA: Diagnosis not present

## 2012-12-07 DIAGNOSIS — M999 Biomechanical lesion, unspecified: Secondary | ICD-10-CM | POA: Diagnosis not present

## 2012-12-24 DIAGNOSIS — M999 Biomechanical lesion, unspecified: Secondary | ICD-10-CM | POA: Diagnosis not present

## 2012-12-24 DIAGNOSIS — M5430 Sciatica, unspecified side: Secondary | ICD-10-CM | POA: Diagnosis not present

## 2013-01-07 DIAGNOSIS — M5430 Sciatica, unspecified side: Secondary | ICD-10-CM | POA: Diagnosis not present

## 2013-01-07 DIAGNOSIS — M999 Biomechanical lesion, unspecified: Secondary | ICD-10-CM | POA: Diagnosis not present

## 2013-01-25 DIAGNOSIS — M999 Biomechanical lesion, unspecified: Secondary | ICD-10-CM | POA: Diagnosis not present

## 2013-01-25 DIAGNOSIS — M5430 Sciatica, unspecified side: Secondary | ICD-10-CM | POA: Diagnosis not present

## 2013-02-17 DIAGNOSIS — Z96619 Presence of unspecified artificial shoulder joint: Secondary | ICD-10-CM | POA: Diagnosis not present

## 2013-03-08 DIAGNOSIS — Z96619 Presence of unspecified artificial shoulder joint: Secondary | ICD-10-CM | POA: Diagnosis not present

## 2013-03-16 DIAGNOSIS — M19049 Primary osteoarthritis, unspecified hand: Secondary | ICD-10-CM | POA: Diagnosis not present

## 2013-04-22 DIAGNOSIS — M19049 Primary osteoarthritis, unspecified hand: Secondary | ICD-10-CM | POA: Diagnosis not present

## 2013-05-04 ENCOUNTER — Telehealth: Payer: Self-pay

## 2013-05-04 NOTE — Telephone Encounter (Signed)
Returned call back to [facility] x 2, no agent available after extensive hold time. Closing phone note until fax received

## 2013-05-04 NOTE — Telephone Encounter (Signed)
Dianna w/ [facility] called [facility] stating that pt is in need of a new CPAP order with last six mos of chart office notes, Returned call to advise that pt last seen 10/13 and not orders received from their office

## 2013-05-27 ENCOUNTER — Other Ambulatory Visit (INDEPENDENT_AMBULATORY_CARE_PROVIDER_SITE_OTHER): Payer: Medicare Other

## 2013-05-27 ENCOUNTER — Encounter: Payer: Self-pay | Admitting: Internal Medicine

## 2013-05-27 ENCOUNTER — Ambulatory Visit (INDEPENDENT_AMBULATORY_CARE_PROVIDER_SITE_OTHER): Payer: Medicare Other | Admitting: Internal Medicine

## 2013-05-27 VITALS — BP 130/68 | HR 68 | Temp 97.2°F | Resp 16 | Wt 197.0 lb

## 2013-05-27 DIAGNOSIS — M1990 Unspecified osteoarthritis, unspecified site: Secondary | ICD-10-CM | POA: Diagnosis not present

## 2013-05-27 DIAGNOSIS — R7309 Other abnormal glucose: Secondary | ICD-10-CM | POA: Insufficient documentation

## 2013-05-27 DIAGNOSIS — M899 Disorder of bone, unspecified: Secondary | ICD-10-CM

## 2013-05-27 DIAGNOSIS — I10 Essential (primary) hypertension: Secondary | ICD-10-CM | POA: Diagnosis not present

## 2013-05-27 DIAGNOSIS — G4733 Obstructive sleep apnea (adult) (pediatric): Secondary | ICD-10-CM

## 2013-05-27 DIAGNOSIS — M8580 Other specified disorders of bone density and structure, unspecified site: Secondary | ICD-10-CM

## 2013-05-27 LAB — BASIC METABOLIC PANEL
BUN: 16 mg/dL (ref 6–23)
Chloride: 100 mEq/L (ref 96–112)
GFR: 64.85 mL/min (ref >60.00)
Potassium: 4.4 mEq/L (ref 3.5–5.1)
Sodium: 137 mEq/L (ref 135–145)

## 2013-05-27 LAB — HEMOGLOBIN A1C: Hgb A1c MFr Bld: 5.9 % (ref 4.6–6.5)

## 2013-05-27 MED ORDER — HYDROCODONE-ACETAMINOPHEN 10-325 MG PO TABS: 1.0000 | ORAL_TABLET | Freq: Three times a day (TID) | ORAL | Status: DC | PRN

## 2013-05-27 NOTE — Assessment & Plan Note (Signed)
Her BP is well controlled 

## 2013-05-27 NOTE — Assessment & Plan Note (Signed)
I will check her A1C to see if she has developed DM2 

## 2013-05-27 NOTE — Patient Instructions (Signed)

## 2013-05-27 NOTE — Assessment & Plan Note (Signed)
She thinks she needs to lower her CPAP pressure She needs a f/up with sleep medicine

## 2013-05-27 NOTE — Assessment & Plan Note (Signed)
I have asked her to get an updated DEXA scan done 

## 2013-05-27 NOTE — Assessment & Plan Note (Signed)
She is doing well on her current pain meds

## 2013-05-27 NOTE — Progress Notes (Signed)
  Subjective:    Patient ID: Veronica Garza, female    DOB: 01/24/1941, 72 y.o.   MRN: [id number]  Arthritis Presents for follow-up visit. She complains of pain. She reports no stiffness, joint swelling or joint warmth. Affected locations include the left foot, right foot, right MCP and left MCP. Her pain is at a severity of 3/10. Associated symptoms include pain at night and pain while resting. Pertinent negatives include no dry eyes, dry mouth, dysuria, fatigue, fever, rash, Raynaud's syndrome, uveitis or weight loss. Compliance with total regimen is 76-100%.      Review of Systems  Constitutional: Negative.  Negative for fever, weight loss and fatigue.  HENT: Negative.   Eyes: Negative.   Respiratory: Positive for apnea. Negative for cough, choking, chest tightness, shortness of breath, wheezing and stridor.   Cardiovascular: Negative.   Gastrointestinal: Negative.  Negative for abdominal pain.  Endocrine: Negative.   Genitourinary: Negative.  Negative for dysuria.  Musculoskeletal: Positive for arthralgias and arthritis. Negative for myalgias, back pain, joint swelling, gait problem and stiffness.  Skin: Negative.  Negative for rash.  Allergic/Immunologic: Negative.   Neurological: Negative.   Hematological: Negative.  Negative for adenopathy. Does not bruise/bleed easily.  Psychiatric/Behavioral: Negative.        Objective:   Physical Exam  Vitals reviewed. Constitutional: She is oriented to person, place, and time. She appears well-developed and well-nourished. No distress.  HENT:  Head: Normocephalic and atraumatic.  Mouth/Throat: Oropharynx is clear and moist. No oropharyngeal exudate.  Eyes: Conjunctivae are normal. Right eye exhibits no discharge. Left eye exhibits no discharge. No scleral icterus.  Neck: Normal range of motion. Neck supple. No JVD present. No tracheal deviation present. No thyromegaly present.  Cardiovascular: Normal rate, regular rhythm, normal heart  sounds and intact distal pulses.  Exam reveals no gallop and no friction rub.   No murmur heard. Pulmonary/Chest: Effort normal and breath sounds normal. No stridor. No respiratory distress. She has no wheezes. She has no rales. She exhibits no tenderness.  Abdominal: Soft. Bowel sounds are normal. She exhibits no distension and no mass. There is no tenderness. There is no rebound and no guarding.  Musculoskeletal: Normal range of motion. She exhibits no edema and no tenderness.  Lymphadenopathy:    She has no cervical adenopathy.  Neurological: She is oriented to person, place, and time.  Skin: Skin is warm and dry. No rash noted. She is not diaphoretic. No erythema. No pallor.  Psychiatric: She has a normal mood and affect. Her behavior is normal. Judgment and thought content normal.     Lab Results  Component Value Date   WBC 6.4 09/04/2012   HGB 13.6 09/04/2012   HCT 40.7 09/04/2012   PLT 206.0 09/04/2012   GLUCOSE 104* 09/04/2012   CHOL 202* 09/04/2012   TRIG 132.0 09/04/2012   HDL 58.50 09/04/2012   LDLDIRECT 119.5 09/04/2012   LDLCALC 107* 08/08/2011   ALT 32 09/04/2012   AST 27 09/04/2012   NA 139 09/04/2012   K 4.5 09/04/2012   CL 103 09/04/2012   CREATININE 0.7 09/04/2012   BUN 17 09/04/2012   CO2 29 09/04/2012   TSH 1.84 09/04/2012   INR 0.93 09/30/2011       Assessment & Plan:

## 2013-06-01 ENCOUNTER — Ambulatory Visit (INDEPENDENT_AMBULATORY_CARE_PROVIDER_SITE_OTHER): Payer: Medicare Other | Admitting: Pulmonary Disease

## 2013-06-01 ENCOUNTER — Encounter: Payer: Self-pay | Admitting: Pulmonary Disease

## 2013-06-01 VITALS — BP 126/76 | HR 63 | Temp 98.0°F | Ht 60.0 in | Wt 198.8 lb

## 2013-06-01 DIAGNOSIS — G4733 Obstructive sleep apnea (adult) (pediatric): Secondary | ICD-10-CM

## 2013-06-01 NOTE — Patient Instructions (Addendum)
Meet with our co-ordinator  Rx for new CPAP will be sent to [facility] pt

## 2013-06-01 NOTE — Assessment & Plan Note (Addendum)
We will try to get her a new CPAP & set at  +7 cm, nasal mask, humidity, download in 4 wks Rx will be sent to [facility] pt If required,CPAP titration can be repeated but since wt has not fluctuated, doubt settings would have changed. Would need to confirm that 7 cm is adequate  Weight loss encouraged, compliance with goal of at least 4-6 hrs every night is the expectation. Advised against medications with sedative side effects Cautioned against driving when sleepy - understanding that sleepiness will vary on a day to day basis

## 2013-06-01 NOTE — Progress Notes (Signed)
Subjective:    Patient ID: Veronica Garza, female    DOB: 08/30/1941, 72 y.o.   MRN: [id number]  HPI 72 year old never smoker referred for management of obstructive sleep apnea. She would like to get a new CPAP machine  Poly somnogram in 2007 showed severe OSA with AHI of 64 events per hour, she weighed 195 pounds then. This was corrected by nasal CPAP of 13 cm. She then underwent Bilateral ethmoidectomy and maxillary sinus ostial enlargement with antrostomies with reduction of turbinates.by Dr Haroldine Laws 2008 and following surgery, heart CPAP pressure was decreased to 7 cm. She reports good compliance with this and her husband has not noted snoring at this pressure. Her supply was [facility] Epworth sleepiness score is 11/24 and she reports some sleepiness while watching TV or sitting inactive in a public place. Bedtime is 11 to 11:30 PM, sleep latency is 10-20 minutes, she has 2 awakenings including a bathroom visit. She sleeps on her back with one pillow and is out of bed by 7:30 AM. She denies dryness of mouth, headaches or nasal stuffiness. Her weight is essentially unchanged since the sleep study  There is no history suggestive of cataplexy, sleep paralysis or parasomnias    Past Medical History  Diagnosis Date  . Hypertension   . Anemia   . OSA (obstructive sleep apnea)   . GERD (gastroesophageal reflux disease)   . Arthritis   . Urinary incontinence   . Pneumonia     History of  . Cataract   . Hx of shoulder replacement     lt    Past Surgical History  Procedure Laterality Date  . Abdominal hysterectomy    . Vaginal sling    . Nasal sinus surgery    . Shoulder arthroscopy      Left  . Eye surgery      bilateral cataract extraction  . Upper gastrointestinal endoscopy    . Total shoulder arthroplasty  10/10/2011    Procedure: TOTAL SHOULDER ARTHROPLASTY;  Surgeon: Vania Rea Supple;  Location: [facility] OR;  Service: Orthopedics;  Laterality: Right;    Allergies   Allergen Reactions  . Iohexol      Code: HIVES, Desc: ? Contrast reaction from CT prior to 2000.   . Naproxen     REACTION: rash  . Neomycin-Bacitracin Zn-Polymyx     REACTION: redness  . Sulfonamide Derivatives     REACTION: rash/tingling   History   Social History  . Marital Status: Married    Spouse Name: N/A    Number of Children: 1  . Years of Education: N/A   Occupational History  . Retired    Social History Main Topics  . Smoking status: Never Smoker   . Smokeless tobacco: Never Used  . Alcohol Use: No  . Drug Use: No  . Sexually Active: Not Currently   Other Topics Concern  . 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 Cancer Father     Prostate cancer  . Cancer Sister     Ovarian cancer  . Diabetes Sister   . Alzheimer's disease Mother   . COPD Neg Hx   . Alcohol abuse Neg Hx   . Early death Neg Hx   . Heart disease Neg Hx   . Hyperlipidemia Neg Hx   . Hypertension Neg Hx   .  Kidney disease Neg Hx   . Stroke Neg Hx     Review of Systems  Constitutional: Negative for fever and unexpected weight change.  HENT: Positive for sneezing and postnasal drip. Negative for ear pain, nosebleeds, congestion, sore throat, rhinorrhea, trouble swallowing, dental problem and sinus pressure.   Eyes: Negative for redness and itching.  Respiratory: Negative for cough, chest tightness, shortness of breath and wheezing.   Cardiovascular: Negative for palpitations and leg swelling.  Gastrointestinal: Negative for nausea and vomiting.  Genitourinary: Negative for dysuria.  Musculoskeletal: Negative for joint swelling.  Skin: Negative for rash.  Neurological: Negative for headaches.  Hematological: Does not bruise/bleed easily.  Psychiatric/Behavioral: Negative for dysphoric mood. The patient is not nervous/anxious.        Objective:   Physical Exam  Gen.  Pleasant, obese, in no distress, normal affect ENT - no lesions, no post nasal drip, class 2-3 airway Neck: No JVD, no thyromegaly, no carotid bruits Lungs: no use of accessory muscles, no dullness to percussion, decreased without rales or rhonchi  Cardiovascular: Rhythm regular, heart sounds  normal, no murmurs or gallops, no peripheral edema Abdomen: soft and non-tender, no hepatosplenomegaly, BS normal. Musculoskeletal: No deformities, no cyanosis or clubbing Neuro:  alert, non focal, no tremors       Assessment & Plan:

## 2013-06-17 DIAGNOSIS — M25579 Pain in unspecified ankle and joints of unspecified foot: Secondary | ICD-10-CM | POA: Diagnosis not present

## 2013-06-17 DIAGNOSIS — M2140 Flat foot [pes planus] (acquired), unspecified foot: Secondary | ICD-10-CM | POA: Diagnosis not present

## 2013-07-05 ENCOUNTER — Other Ambulatory Visit: Payer: Self-pay | Admitting: Internal Medicine

## 2013-07-14 ENCOUNTER — Telehealth: Payer: Self-pay | Admitting: Pulmonary Disease

## 2013-07-14 NOTE — Telephone Encounter (Signed)
EPIC shows order was sent over 06/01/13. Will need to call [facility] in the AM as they now closed  Pt aware we are working on this. Will call and give her update once we know.

## 2013-07-16 NOTE — Telephone Encounter (Signed)
Spoke with Tammy Sours @ [facility]-- per Clarene Reamer does have this order- order verified w Hilda Lias w patient to inform her of this-- patient asking when will she receive new machine Per patient no one from [facility] has got in touch with her Called [facility] back spoke with Marcelle Smiling-- she states she will have repap department return our call to provide information as to why patient has not received new machine Will await return call

## 2013-07-20 NOTE — Telephone Encounter (Signed)
Spoke with Veronica Garza at [facility] and they are awaiting a physicians order form to be filled out by Dr Vassie Loll.  She reports that this was faxed to [phone] 4 different times to attn Dr Yetta Barre.  I informed her that we do not have a Dr Yetta Barre in our practice and that is not our fax number.  Fax number in triage was given to her .  We will await fax to give to Dr Vassie Loll to sign.

## 2013-07-20 NOTE — Telephone Encounter (Signed)
Patient returning call.

## 2013-07-20 NOTE — Telephone Encounter (Signed)
lmtcb x1 for pt. 

## 2013-07-21 NOTE — Telephone Encounter (Signed)
Form received and in Dr Annie Main folder waiting for signature, once signed I will fax .Kandice Hams

## 2013-07-21 NOTE — Telephone Encounter (Signed)
Form received and given to Alida to verify.  Will route msg to Alida for f/u.

## 2013-07-27 ENCOUNTER — Telehealth: Payer: Self-pay | Admitting: Pulmonary Disease

## 2013-07-27 NOTE — Telephone Encounter (Signed)
[email or web address].Jennifer Castillo, CMA  

## 2013-07-28 ENCOUNTER — Telehealth: Payer: Self-pay | Admitting: Adult Health

## 2013-07-28 ENCOUNTER — Telehealth: Payer: Self-pay | Admitting: Pulmonary Disease

## 2013-07-28 NOTE — Telephone Encounter (Signed)
I spoke with BOB he needed pt order from 06/01/13 resent to him. i have done so. Nothing further needed

## 2013-07-28 NOTE — Telephone Encounter (Signed)
Error.Veronica Garza ° °

## 2013-07-28 NOTE — Telephone Encounter (Signed)
LMOMTCB x 2 for Veronica Garza at [facility] pt

## 2013-07-28 NOTE — Telephone Encounter (Signed)
I spoke with pt and advised her the order we sent was to be set on 7 not 10. She stated must have been misunderstanding and nothing further needed

## 2013-08-13 DIAGNOSIS — M25579 Pain in unspecified ankle and joints of unspecified foot: Secondary | ICD-10-CM | POA: Diagnosis not present

## 2013-08-13 DIAGNOSIS — M2140 Flat foot [pes planus] (acquired), unspecified foot: Secondary | ICD-10-CM | POA: Diagnosis not present

## 2013-09-02 ENCOUNTER — Ambulatory Visit (INDEPENDENT_AMBULATORY_CARE_PROVIDER_SITE_OTHER): Payer: Medicare Other | Admitting: Pulmonary Disease

## 2013-09-02 ENCOUNTER — Encounter: Payer: Self-pay | Admitting: Pulmonary Disease

## 2013-09-02 VITALS — BP 112/70 | HR 76 | Ht 60.0 in | Wt 199.4 lb

## 2013-09-02 DIAGNOSIS — G4733 Obstructive sleep apnea (adult) (pediatric): Secondary | ICD-10-CM

## 2013-09-02 DIAGNOSIS — Z23 Encounter for immunization: Secondary | ICD-10-CM | POA: Diagnosis not present

## 2013-09-02 NOTE — Assessment & Plan Note (Addendum)
Your CPAP is set at 7 cm Call sleep lab for appt for nasal mask fitting [phone] Flu shot  Weight loss encouraged, compliance with goal of at least 4-6 hrs every night is the expectation. Advised against medications with sedative side effects Cautioned against driving when sleepy - understanding that sleepiness will vary on a day to day basis

## 2013-09-02 NOTE — Progress Notes (Signed)
  Subjective:    Patient ID: Veronica Garza, female    DOB: 13-Oct-1941, 72 y.o.   MRN: [id number]  HPI  72 year old never smoker referred for management of obstructive sleep apnea.  She would like to get a new CPAP machine  Poly somnogram in 2007 showed severe OSA with AHI of 64 events per hour, she weighed 195 pounds then. This was corrected by nasal CPAP of 13 cm.  She then underwent Bilateral ethmoidectomy and maxillary sinus ostial enlargement with antrostomies with reduction of turbinates.by Dr Haroldine Laws 2008 and following surgery, heart CPAP pressure was decreased to 7 cm. She reports good compliance with this and her husband has not noted snoring at this pressure. Her supply was [facility]  Epworth sleepiness score is 11/24 and she reports some sleepiness while watching TV or sitting inactive in a public place.    09/02/2013  Pt reports she wears her CPAP everynight x 7 hrs a night. Denies any problems with mask/machine/pressure. She takes occasional naps during the day. Most part feels rested during the day. Download 08/30/13 1 mnth on 7 c m >> no residuals, good usage, no leak Nasal mask makes mark on bridge of nose - [facility] RT will come out in to check  Bedtime is 11 to 11:30 PM, sleep latency is 10-20 minutes, she has 2 awakenings including a bathroom visit. She sleeps on her back with one pillow and is out of bed by 7:30 AM. She denies dryness of mouth, headaches or nasal stuffiness. Her weight is essentially unchanged since the sleep study  Review of Systems neg for any significant sore throat, dysphagia, itching, sneezing, nasal congestion or excess/ purulent secretions, fever, chills, sweats, unintended wt loss, pleuritic or exertional cp, hempoptysis, orthopnea pnd or change in chronic leg swelling. Also denies presyncope, palpitations, heartburn, abdominal pain, nausea, vomiting, diarrhea or change in bowel or urinary habits, dysuria,hematuria, rash, arthralgias,  visual complaints, headache, numbness weakness or ataxia.     Objective:   Physical Exam  Gen. Pleasant, well-nourished, in no distress ENT - no lesions, no post nasal drip Neck: No JVD, no thyromegaly, no carotid bruits Lungs: no use of accessory muscles, no dullness to percussion, clear without rales or rhonchi  Cardiovascular: Rhythm regular, heart sounds  normal, no murmurs or gallops, no peripheral edema Musculoskeletal: No deformities, no cyanosis or clubbing        Assessment & Plan:

## 2013-09-02 NOTE — Patient Instructions (Addendum)
Your CPAP is set at 7 cm Call sleep lab for appt for nasal mask fitting [phone] Flu shot

## 2013-09-08 ENCOUNTER — Ambulatory Visit (INDEPENDENT_AMBULATORY_CARE_PROVIDER_SITE_OTHER): Payer: Medicare Other | Admitting: Ophthalmology

## 2013-09-08 DIAGNOSIS — H35039 Hypertensive retinopathy, unspecified eye: Secondary | ICD-10-CM | POA: Diagnosis not present

## 2013-09-08 DIAGNOSIS — H3530 Unspecified macular degeneration: Secondary | ICD-10-CM | POA: Diagnosis not present

## 2013-09-08 DIAGNOSIS — I10 Essential (primary) hypertension: Secondary | ICD-10-CM

## 2013-09-08 DIAGNOSIS — H43819 Vitreous degeneration, unspecified eye: Secondary | ICD-10-CM | POA: Diagnosis not present

## 2013-09-09 ENCOUNTER — Ambulatory Visit: Payer: Medicare Other

## 2013-09-21 ENCOUNTER — Ambulatory Visit: Payer: Medicare Other | Admitting: Pulmonary Disease

## 2013-10-22 ENCOUNTER — Ambulatory Visit (INDEPENDENT_AMBULATORY_CARE_PROVIDER_SITE_OTHER): Payer: Medicare Other | Admitting: Nurse Practitioner

## 2013-10-22 ENCOUNTER — Ambulatory Visit: Payer: Medicare Other

## 2013-10-22 ENCOUNTER — Ambulatory Visit (INDEPENDENT_AMBULATORY_CARE_PROVIDER_SITE_OTHER)
Admission: RE | Admit: 2013-10-22 | Discharge: 2013-10-22 | Disposition: A | Discharge status: Home / Self Care | Payer: Medicare Other | Source: Ambulatory Visit | Attending: Nurse Practitioner | Admitting: Nurse Practitioner

## 2013-10-22 ENCOUNTER — Encounter: Payer: Self-pay | Admitting: Nurse Practitioner

## 2013-10-22 VITALS — BP 170/98 | HR 71 | Temp 97.7°F | Ht 60.0 in | Wt 198.8 lb

## 2013-10-22 DIAGNOSIS — R079 Chest pain, unspecified: Secondary | ICD-10-CM | POA: Diagnosis not present

## 2013-10-22 DIAGNOSIS — R0781 Pleurodynia: Secondary | ICD-10-CM

## 2013-10-22 NOTE — Progress Notes (Signed)
  Subjective:    Patient ID: Veronica Garza, female    DOB: [birth year], 72 y.o.   MRN: [id number]  HPI Comments: Pt mentions that she is running out of hydrocodone.   Flank Pain This is a new problem. The current episode started 1 to 4 weeks ago (2wks). The problem occurs intermittently. The problem has been waxing and waning since onset. Pain location: L posteroir chest. The quality of the pain is described as aching. The pain does not radiate. The pain is moderate. The pain is the same all the time. The symptoms are aggravated by bending. Stiffness is present all day. Associated symptoms include chest pain (c/o posterior R sided pain over ribs). Pertinent negatives include no abdominal pain, bladder incontinence, bowel incontinence, dysuria, fever, numbness, paresis, paresthesias, pelvic pain, tingling or weakness. Risk factors include sedentary lifestyle. She has tried NSAIDs for the symptoms. The treatment provided no relief.      Review of Systems  Constitutional: Negative for fever, chills, activity change, appetite change and fatigue.  Respiratory: Negative for cough, chest tightness, shortness of breath and wheezing.   Cardiovascular: Positive for chest pain (c/o posterior R sided pain over ribs).  Gastrointestinal: Negative for nausea, abdominal pain, diarrhea, abdominal distention and bowel incontinence.  Genitourinary: Positive for flank pain. Negative for bladder incontinence, dysuria and pelvic pain.  Neurological: Negative for tingling, weakness, numbness and paresthesias.       Objective:   Physical Exam  Vitals reviewed. Constitutional: She is oriented to person, place, and time. She appears well-nourished. She appears distressed (tearful at times).  HENT:  Head: Normocephalic and atraumatic.  Cardiovascular: Normal rate and regular rhythm.   No murmur heard. Pulmonary/Chest: Effort normal and breath sounds normal. No respiratory distress. She has no wheezes.   Abdominal: Soft. Bowel sounds are normal. She exhibits no distension and no mass. There is no tenderness. There is no rebound and no guarding.  Musculoskeletal: She exhibits tenderness (c/o tenderness inconsistent with exam. Pt c/o pain to palpation over posterior R ribs.).  Normal gait  Neurological: She is alert and oriented to person, place, and time.  Skin: Skin is warm and dry.  Psychiatric: She has a normal mood and affect. Her behavior is normal. Thought content normal.          Assessment & Plan:  1. Rib pain on right side Pt reaction is inconsistent with exam. Treat as costochondritis-NSAIDS. - DG Ribs Unilateral Right;no fx on xray. See pt instructions.

## 2013-10-22 NOTE — Progress Notes (Signed)
Pre-visit discussion using our clinic review tool. No additional management support is needed unless otherwise documented below in the visit note.  

## 2013-10-22 NOTE — Patient Instructions (Signed)
Based on your exam, you seem to be having musculoskeletal pain. Likely this is costochondritis or a rib fracture. xrays will help to determine if you have a fracture.The treatment is anti-inflammatory medication. You may take 3 Tabs ibuprophen twice daily with food. If there is no improvement within 1 week, please call for re-evaluation.   Costochondritis Costochondritis (Tietze syndrome), or costochondral separation, is a swelling and irritation (inflammation) of the tissue (cartilage) that connects your ribs with your breastbone (sternum). It may occur on its own (spontaneously), through damage caused by an accident (trauma), or simply from coughing or minor exercise. It may take up to 6 weeks to get better and longer if you are unable to be conservative in your activities. HOME CARE INSTRUCTIONS   Avoid exhausting physical activity. Try not to strain your ribs during normal activity. This would include any activities using chest, belly (abdominal), and side muscles, especially if heavy weights are used.  Use ice for 15-20 minutes per hour while awake for the first 2 days. Place the ice in a plastic bag, and place a towel between the bag of ice and your skin.  Only take over-the-counter or prescription medicines for pain, discomfort, or fever as directed by your caregiver. SEEK IMMEDIATE MEDICAL CARE IF:   Your pain increases or you are very uncomfortable.  You have a fever.  You develop difficulty with your breathing.  You cough up blood.  You develop worse chest pains, shortness of breath, sweating, or vomiting.  You develop new, unexplained problems (symptoms). MAKE SURE YOU:   Understand these instructions.  Will watch your condition.  Will get help right away if you are not doing well or get worse. Document Released: 08/28/2005 Document Revised: 02/10/2012 Document Reviewed: 06/22/2013 [facility] Patient Information 2014 [place], [place].

## 2013-11-11 DIAGNOSIS — H26499 Other secondary cataract, unspecified eye: Secondary | ICD-10-CM | POA: Diagnosis not present

## 2013-11-11 DIAGNOSIS — H353190 Nonexudative age-related macular degeneration, unspecified eye, stage unspecified: Secondary | ICD-10-CM | POA: Diagnosis not present

## 2013-12-22 ENCOUNTER — Other Ambulatory Visit: Payer: Self-pay | Admitting: Internal Medicine

## 2013-12-23 DIAGNOSIS — H04129 Dry eye syndrome of unspecified lacrimal gland: Secondary | ICD-10-CM | POA: Diagnosis not present

## 2013-12-23 DIAGNOSIS — H353190 Nonexudative age-related macular degeneration, unspecified eye, stage unspecified: Secondary | ICD-10-CM | POA: Diagnosis not present

## 2014-05-18 ENCOUNTER — Other Ambulatory Visit: Payer: Self-pay

## 2014-05-18 DIAGNOSIS — Z1231 Encounter for screening mammogram for malignant neoplasm of breast: Secondary | ICD-10-CM

## 2014-05-30 ENCOUNTER — Encounter (INDEPENDENT_AMBULATORY_CARE_PROVIDER_SITE_OTHER): Payer: Self-pay

## 2014-05-30 ENCOUNTER — Ambulatory Visit
Admission: RE | Admit: 2014-05-30 | Discharge: 2014-05-30 | Disposition: A | Discharge status: Home / Self Care | Payer: Medicare Other | Source: Ambulatory Visit

## 2014-05-30 DIAGNOSIS — Z1231 Encounter for screening mammogram for malignant neoplasm of breast: Secondary | ICD-10-CM

## 2014-05-31 LAB — HM MAMMOGRAPHY: HM Mammogram: NORMAL

## 2014-07-13 DIAGNOSIS — Z471 Aftercare following joint replacement surgery: Secondary | ICD-10-CM | POA: Diagnosis not present

## 2014-07-13 DIAGNOSIS — M25519 Pain in unspecified shoulder: Secondary | ICD-10-CM | POA: Diagnosis not present

## 2014-07-19 DIAGNOSIS — L821 Other seborrheic keratosis: Secondary | ICD-10-CM | POA: Diagnosis not present

## 2014-07-19 DIAGNOSIS — C4491 Basal cell carcinoma of skin, unspecified: Secondary | ICD-10-CM

## 2014-07-19 DIAGNOSIS — D485 Neoplasm of uncertain behavior of skin: Secondary | ICD-10-CM | POA: Diagnosis not present

## 2014-07-19 DIAGNOSIS — C44319 Basal cell carcinoma of skin of other parts of face: Secondary | ICD-10-CM | POA: Diagnosis not present

## 2014-07-19 DIAGNOSIS — D219 Benign neoplasm of connective and other soft tissue, unspecified: Secondary | ICD-10-CM | POA: Diagnosis not present

## 2014-07-19 HISTORY — DX: Basal cell carcinoma of skin, unspecified: C44.91

## 2014-08-30 ENCOUNTER — Encounter: Payer: Self-pay | Admitting: Internal Medicine

## 2014-08-30 ENCOUNTER — Ambulatory Visit (INDEPENDENT_AMBULATORY_CARE_PROVIDER_SITE_OTHER): Payer: Medicare Other | Admitting: Internal Medicine

## 2014-08-30 ENCOUNTER — Other Ambulatory Visit (HOSPITAL_COMMUNITY)
Admission: RE | Admit: 2014-08-30 | Discharge: 2014-08-30 | Disposition: A | Discharge status: Home / Self Care | Payer: Medicare Other | Source: Ambulatory Visit | Attending: Internal Medicine | Admitting: Internal Medicine

## 2014-08-30 ENCOUNTER — Other Ambulatory Visit: Payer: Medicare Other

## 2014-08-30 ENCOUNTER — Other Ambulatory Visit (INDEPENDENT_AMBULATORY_CARE_PROVIDER_SITE_OTHER): Payer: Medicare Other

## 2014-08-30 VITALS — BP 138/82 | HR 65 | Temp 97.7°F | Resp 16 | Ht 60.0 in | Wt 205.0 lb

## 2014-08-30 DIAGNOSIS — M159 Polyosteoarthritis, unspecified: Secondary | ICD-10-CM

## 2014-08-30 DIAGNOSIS — I10 Essential (primary) hypertension: Secondary | ICD-10-CM

## 2014-08-30 DIAGNOSIS — Z23 Encounter for immunization: Secondary | ICD-10-CM | POA: Diagnosis not present

## 2014-08-30 DIAGNOSIS — R7309 Other abnormal glucose: Secondary | ICD-10-CM

## 2014-08-30 DIAGNOSIS — Z0000 Encounter for general adult medical examination without abnormal findings: Secondary | ICD-10-CM

## 2014-08-30 DIAGNOSIS — Z124 Encounter for screening for malignant neoplasm of cervix: Secondary | ICD-10-CM | POA: Insufficient documentation

## 2014-08-30 DIAGNOSIS — E785 Hyperlipidemia, unspecified: Secondary | ICD-10-CM | POA: Diagnosis not present

## 2014-08-30 DIAGNOSIS — N760 Acute vaginitis: Secondary | ICD-10-CM

## 2014-08-30 DIAGNOSIS — M150 Primary generalized (osteo)arthritis: Secondary | ICD-10-CM

## 2014-08-30 DIAGNOSIS — R32 Unspecified urinary incontinence: Secondary | ICD-10-CM

## 2014-08-30 LAB — CBC WITH DIFFERENTIAL/PLATELET
BASOS PCT: 0.5 % (ref 0.0–3.0)
Basophils Absolute: 0 10*3/uL (ref 0.0–0.1)
EOS ABS: 0.2 10*3/uL (ref 0.0–0.7)
EOS PCT: 2.8 % (ref 0.0–5.0)
HEMATOCRIT: 39.2 % (ref 36.0–46.0)
HEMOGLOBIN: 13.6 g/dL (ref 12.0–15.0)
LYMPHS ABS: 2.1 10*3/uL (ref 0.7–4.0)
Lymphocytes Relative: 34.5 % (ref 12.0–46.0)
MCHC: 34.8 g/dL (ref 30.0–36.0)
MCV: 93.1 fl (ref 78.0–100.0)
MONO ABS: 0.4 10*3/uL (ref 0.1–1.0)
Monocytes Relative: 7.3 % (ref 3.0–12.0)
NEUTROS ABS: 3.3 10*3/uL (ref 1.4–7.7)
Neutrophils Relative %: 54.9 % (ref 43.0–77.0)
Platelets: 198 10*3/uL (ref 150.0–400.0)
RBC: 4.21 Mil/uL (ref 3.87–5.11)
RDW: 13.6 % (ref 11.5–15.5)
WBC: 6.1 10*3/uL (ref 4.0–10.5)

## 2014-08-30 LAB — URINALYSIS, ROUTINE W REFLEX MICROSCOPIC
BILIRUBIN URINE: NEGATIVE
Hgb urine dipstick: NEGATIVE
KETONES UR: NEGATIVE
LEUKOCYTES UA: NEGATIVE
Nitrite: NEGATIVE
PH: 5.5 (ref 5.0–8.0)
RBC / HPF: NONE SEEN (ref 0–?)
Specific Gravity, Urine: 1.01 (ref 1.000–1.030)
TOTAL PROTEIN, URINE-UPE24: NEGATIVE
Urine Glucose: NEGATIVE
Urobilinogen, UA: 0.2 (ref 0.0–1.0)
WBC, UA: NONE SEEN (ref 0–?)

## 2014-08-30 LAB — LIPID PANEL
CHOLESTEROL: 192 mg/dL (ref 0–200)
HDL: 52.9 mg/dL (ref >39.00)
LDL CALC: 112 mg/dL — AB (ref 0–99)
NonHDL: 139.1
TRIGLYCERIDES: 138 mg/dL (ref 0.0–149.0)
Total CHOL/HDL Ratio: 4
VLDL: 27.6 mg/dL (ref 0.0–40.0)

## 2014-08-30 LAB — COMPREHENSIVE METABOLIC PANEL
ALK PHOS: 102 U/L (ref 39–117)
ALT: 27 U/L (ref 0–35)
AST: 26 U/L (ref 0–37)
Albumin: 4.4 g/dL (ref 3.5–5.2)
BILIRUBIN TOTAL: 0.8 mg/dL (ref 0.2–1.2)
BUN: 19 mg/dL (ref 6–23)
CO2: 23 mEq/L (ref 19–32)
Calcium: 9.6 mg/dL (ref 8.4–10.5)
Chloride: 104 mEq/L (ref 96–112)
Creatinine, Ser: 0.8 mg/dL (ref 0.4–1.2)
GFR: 78.35 mL/min (ref >60.00)
GLUCOSE: 103 mg/dL — AB (ref 70–99)
POTASSIUM: 4.4 meq/L (ref 3.5–5.1)
Sodium: 138 mEq/L (ref 135–145)
Total Protein: 7.6 g/dL (ref 6.0–8.3)

## 2014-08-30 LAB — WET PREP, GENITAL
Clue Cells Wet Prep HPF POC: NONE SEEN — AB
TRICH WET PREP: NONE SEEN — AB
Yeast Wet Prep HPF POC: NONE SEEN — AB

## 2014-08-30 LAB — TSH: TSH: 0.79 u[IU]/mL (ref 0.35–4.50)

## 2014-08-30 LAB — HM PAP SMEAR

## 2014-08-30 LAB — HEMOGLOBIN A1C: Hgb A1c MFr Bld: 5.7 % (ref 4.6–6.5)

## 2014-08-30 MED ORDER — HYDROCODONE-ACETAMINOPHEN 10-325 MG PO TABS: 1.0000 | ORAL_TABLET | Freq: Three times a day (TID) | ORAL | Status: DC | PRN

## 2014-08-30 NOTE — Assessment & Plan Note (Signed)
Will check her A1C to see if she has developed DM2

## 2014-08-30 NOTE — Progress Notes (Signed)
Pre visit review using our clinic review tool, if applicable. No additional management support is needed unless otherwise documented below in the visit note. 

## 2014-08-30 NOTE — Assessment & Plan Note (Signed)

## 2014-08-30 NOTE — Assessment & Plan Note (Signed)
Will cont nsaids and norco as needed for pain

## 2014-08-30 NOTE — Assessment & Plan Note (Signed)
Her BP is well controlled Will monitor her lytes and renal function 

## 2014-08-30 NOTE — Patient Instructions (Signed)
Preventive Care for Adults A healthy lifestyle and preventive care can promote health and wellness. Preventive health guidelines for women include the following key practices.  A routine yearly physical is a good way to check with your health care provider about your health and preventive screening. It is a chance to share any concerns and updates on your health and to receive a thorough exam.  Visit your dentist for a routine exam and preventive care every 6 months. Brush your teeth twice a day and floss once a day. Good oral hygiene prevents tooth decay and gum disease.  The frequency of eye exams is based on your age, health, family medical history, use of contact lenses, and other factors. Follow your health care provider's recommendations for frequency of eye exams.  Eat a healthy diet. Foods like vegetables, fruits, whole grains, low-fat dairy products, and lean protein foods contain the nutrients you need without too many calories. Decrease your intake of foods high in solid fats, added sugars, and salt. Eat the right amount of calories for you.Get information about a proper diet from your health care provider, if necessary.  Regular physical exercise is one of the most important things you can do for your health. Most adults should get at least 150 minutes of moderate-intensity exercise (any activity that increases your heart rate and causes you to sweat) each week. In addition, most adults need muscle-strengthening exercises on 2 or more days a week.  Maintain a healthy weight. The body mass index (BMI) is a screening tool to identify possible weight problems. It provides an estimate of body fat based on height and weight. Your health care provider can find your BMI and can help you achieve or maintain a healthy weight.For adults 20 years and older:  A BMI below 18.5 is considered underweight.  A BMI of 18.5 to 24.9 is normal.  A BMI of 25 to 29.9 is considered overweight.  A BMI of  30 and above is considered obese.  Maintain normal blood lipids and cholesterol levels by exercising and minimizing your intake of saturated fat. Eat a balanced diet with plenty of fruit and vegetables. Blood tests for lipids and cholesterol should begin at age 76 and be repeated every 5 years. If your lipid or cholesterol levels are high, you are over 50, or you are at high risk for heart disease, you may need your cholesterol levels checked more frequently.Ongoing high lipid and cholesterol levels should be treated with medicines if diet and exercise are not working.  If you smoke, find out from your health care provider how to quit. If you do not use tobacco, do not start.  Lung cancer screening is recommended for adults aged 22-80 years who are at high risk for developing lung cancer because of a history of smoking. A yearly low-dose CT scan of the lungs is recommended for people who have at least a 30-pack-year history of smoking and are a current smoker or have quit within the past 15 years. A pack year of smoking is smoking an average of 1 pack of cigarettes a day for 1 year (for example: 1 pack a day for 30 years or 2 packs a day for 15 years). Yearly screening should continue until the smoker has stopped smoking for at least 15 years. Yearly screening should be stopped for people who develop a health problem that would prevent them from having lung cancer treatment.  If you are pregnant, do not drink alcohol. If you are breastfeeding,  be very cautious about drinking alcohol. If you are not pregnant and choose to drink alcohol, do not have more than 1 drink per day. One drink is considered to be 12 ounces (355 mL) of beer, 5 ounces (148 mL) of wine, or 1.5 ounces (44 mL) of liquor.  Avoid use of street drugs. Do not share needles with anyone. Ask for help if you need support or instructions about stopping the use of drugs.  High blood pressure causes heart disease and increases the risk of  stroke. Your blood pressure should be checked at least every 1 to 2 years. Ongoing high blood pressure should be treated with medicines if weight loss and exercise do not work.  If you are 75-52 years old, ask your health care provider if you should take aspirin to prevent strokes.  Diabetes screening involves taking a blood sample to check your fasting blood sugar level. This should be done once every 3 years, after age 15, if you are within normal weight and without risk factors for diabetes. Testing should be considered at a younger age or be carried out more frequently if you are overweight and have at least 1 risk factor for diabetes.  Breast cancer screening is essential preventive care for women. You should practice "breast self-awareness." This means understanding the normal appearance and feel of your breasts and may include breast self-examination. Any changes detected, no matter how small, should be reported to a health care provider. Women in their 58s and 30s should have a clinical breast exam (CBE) by a health care provider as part of a regular health exam every 1 to 3 years. After age 16, women should have a CBE every year. Starting at age 53, women should consider having a mammogram (breast X-ray test) every year. Women who have a family history of breast cancer should talk to their health care provider about genetic screening. Women at a high risk of breast cancer should talk to their health care providers about having an MRI and a mammogram every year.  Breast cancer gene (BRCA)-related cancer risk assessment is recommended for women who have family members with BRCA-related cancers. BRCA-related cancers include breast, ovarian, tubal, and peritoneal cancers. Having family members with these cancers may be associated with an increased risk for harmful changes (mutations) in the breast cancer genes BRCA1 and BRCA2. Results of the assessment will determine the need for genetic counseling and  BRCA1 and BRCA2 testing.  Routine pelvic exams to screen for cancer are no longer recommended for nonpregnant women who are considered low risk for cancer of the pelvic organs (ovaries, uterus, and vagina) and who do not have symptoms. Ask your health care provider if a screening pelvic exam is right for you.  If you have had past treatment for cervical cancer or a condition that could lead to cancer, you need Pap tests and screening for cancer for at least 20 years after your treatment. If Pap tests have been discontinued, your risk factors (such as having a new sexual partner) need to be reassessed to determine if screening should be resumed. Some women have medical problems that increase the chance of getting cervical cancer. In these cases, your health care provider may recommend more frequent screening and Pap tests.  The HPV test is an additional test that may be used for cervical cancer screening. The HPV test looks for the virus that can cause the cell changes on the cervix. The cells collected during the Pap test can be  tested for HPV. The HPV test could be used to screen women aged 30 years and older, and should be used in women of any age who have unclear Pap test results. After the age of 30, women should have HPV testing at the same frequency as a Pap test.  Colorectal cancer can be detected and often prevented. Most routine colorectal cancer screening begins at the age of 50 years and continues through age 75 years. However, your health care provider may recommend screening at an earlier age if you have risk factors for colon cancer. On a yearly basis, your health care provider may provide home test kits to check for hidden blood in the stool. Use of a small camera at the end of a tube, to directly examine the colon (sigmoidoscopy or colonoscopy), can detect the earliest forms of colorectal cancer. Talk to your health care provider about this at age 50, when routine screening begins. Direct  exam of the colon should be repeated every 5-10 years through age 75 years, unless early forms of pre-cancerous polyps or small growths are found.  People who are at an increased risk for hepatitis B should be screened for this virus. You are considered at high risk for hepatitis B if:  You were born in a country where hepatitis B occurs often. Talk with your health care provider about which countries are considered high risk.  Your parents were born in a high-risk country and you have not received a shot to protect against hepatitis B (hepatitis B vaccine).  You have HIV or AIDS.  You use needles to inject street drugs.  You live with, or have sex with, someone who has hepatitis B.  You get hemodialysis treatment.  You take certain medicines for conditions like cancer, organ transplantation, and autoimmune conditions.  Hepatitis C blood testing is recommended for all people born from 1945 through 1965 and any individual with known risks for hepatitis C.  Practice safe sex. Use condoms and avoid high-risk sexual practices to reduce the spread of sexually transmitted infections (STIs). STIs include gonorrhea, chlamydia, syphilis, trichomonas, herpes, HPV, and human immunodeficiency virus (HIV). Herpes, HIV, and HPV are viral illnesses that have no cure. They can result in disability, cancer, and death.  You should be screened for sexually transmitted illnesses (STIs) including gonorrhea and chlamydia if:  You are sexually active and are younger than 24 years.  You are older than 24 years and your health care provider tells you that you are at risk for this type of infection.  Your sexual activity has changed since you were last screened and you are at an increased risk for chlamydia or gonorrhea. Ask your health care provider if you are at risk.  If you are at risk of being infected with HIV, it is recommended that you take a prescription medicine daily to prevent HIV infection. This is  called preexposure prophylaxis (PrEP). You are considered at risk if:  You are a heterosexual woman, are sexually active, and are at increased risk for HIV infection.  You take drugs by injection.  You are sexually active with a partner who has HIV.  Talk with your health care provider about whether you are at high risk of being infected with HIV. If you choose to begin PrEP, you should first be tested for HIV. You should then be tested every 3 months for as long as you are taking PrEP.  Osteoporosis is a disease in which the bones lose minerals and strength   with aging. This can result in serious bone fractures or breaks. The risk of osteoporosis can be identified using a bone density scan. Women ages 65 years and over and women at risk for fractures or osteoporosis should discuss screening with their health care providers. Ask your health care provider whether you should take a calcium supplement or vitamin D to reduce the rate of osteoporosis.  Menopause can be associated with physical symptoms and risks. Hormone replacement therapy is available to decrease symptoms and risks. You should talk to your health care provider about whether hormone replacement therapy is right for you.  Use sunscreen. Apply sunscreen liberally and repeatedly throughout the day. You should seek shade when your shadow is shorter than you. Protect yourself by wearing long sleeves, pants, a wide-brimmed hat, and sunglasses year round, whenever you are outdoors.  Once a month, do a whole body skin exam, using a mirror to look at the skin on your back. Tell your health care provider of new moles, moles that have irregular borders, moles that are larger than a pencil eraser, or moles that have changed in shape or color.  Stay current with required vaccines (immunizations).  Influenza vaccine. All adults should be immunized every year.  Tetanus, diphtheria, and acellular pertussis (Td, Tdap) vaccine. Pregnant women should  receive 1 dose of Tdap vaccine during each pregnancy. The dose should be obtained regardless of the length of time since the last dose. Immunization is preferred during the 27th-36th week of gestation. An adult who has not previously received Tdap or who does not know her vaccine status should receive 1 dose of Tdap. This initial dose should be followed by tetanus and diphtheria toxoids (Td) booster doses every 10 years. Adults with an unknown or incomplete history of completing a 3-dose immunization series with Td-containing vaccines should begin or complete a primary immunization series including a Tdap dose. Adults should receive a Td booster every 10 years.  Varicella vaccine. An adult without evidence of immunity to varicella should receive 2 doses or a second dose if she has previously received 1 dose. Pregnant females who do not have evidence of immunity should receive the first dose after pregnancy. This first dose should be obtained before leaving the health care facility. The second dose should be obtained 4-8 weeks after the first dose.  Human papillomavirus (HPV) vaccine. Females aged 13-26 years who have not received the vaccine previously should obtain the 3-dose series. The vaccine is not recommended for use in pregnant females. However, pregnancy testing is not needed before receiving a dose. If a female is found to be pregnant after receiving a dose, no treatment is needed. In that case, the remaining doses should be delayed until after the pregnancy. Immunization is recommended for any person with an immunocompromised condition through the age of 26 years if she did not get any or all doses earlier. During the 3-dose series, the second dose should be obtained 4-8 weeks after the first dose. The third dose should be obtained 24 weeks after the first dose and 16 weeks after the second dose.  Zoster vaccine. One dose is recommended for adults aged 60 years or older unless certain conditions are  present.  Measles, mumps, and rubella (MMR) vaccine. Adults born before 1957 generally are considered immune to measles and mumps. Adults born in 1957 or later should have 1 or more doses of MMR vaccine unless there is a contraindication to the vaccine or there is laboratory evidence of immunity to   each of the three diseases. A routine second dose of MMR vaccine should be obtained at least 28 days after the first dose for students attending postsecondary schools, health care workers, or international travelers. People who received inactivated measles vaccine or an unknown type of measles vaccine during 1963-1967 should receive 2 doses of MMR vaccine. People who received inactivated mumps vaccine or an unknown type of mumps vaccine before 1979 and are at high risk for mumps infection should consider immunization with 2 doses of MMR vaccine. For females of childbearing age, rubella immunity should be determined. If there is no evidence of immunity, females who are not pregnant should be vaccinated. If there is no evidence of immunity, females who are pregnant should delay immunization until after pregnancy. Unvaccinated health care workers born before 1957 who lack laboratory evidence of measles, mumps, or rubella immunity or laboratory confirmation of disease should consider measles and mumps immunization with 2 doses of MMR vaccine or rubella immunization with 1 dose of MMR vaccine.  Pneumococcal 13-valent conjugate (PCV13) vaccine. When indicated, a person who is uncertain of her immunization history and has no record of immunization should receive the PCV13 vaccine. An adult aged 19 years or older who has certain medical conditions and has not been previously immunized should receive 1 dose of PCV13 vaccine. This PCV13 should be followed with a dose of pneumococcal polysaccharide (PPSV23) vaccine. The PPSV23 vaccine dose should be obtained at least 8 weeks after the dose of PCV13 vaccine. An adult aged 19  years or older who has certain medical conditions and previously received 1 or more doses of PPSV23 vaccine should receive 1 dose of PCV13. The PCV13 vaccine dose should be obtained 1 or more years after the last PPSV23 vaccine dose.  Pneumococcal polysaccharide (PPSV23) vaccine. When PCV13 is also indicated, PCV13 should be obtained first. All adults aged 65 years and older should be immunized. An adult younger than age 65 years who has certain medical conditions should be immunized. Any person who resides in a nursing home or long-term care facility should be immunized. An adult smoker should be immunized. People with an immunocompromised condition and certain other conditions should receive both PCV13 and PPSV23 vaccines. People with human immunodeficiency virus (HIV) infection should be immunized as soon as possible after diagnosis. Immunization during chemotherapy or radiation therapy should be avoided. Routine use of PPSV23 vaccine is not recommended for American Indians, [place] Natives, or people younger than 65 years unless there are medical conditions that require PPSV23 vaccine. When indicated, people who have unknown immunization and have no record of immunization should receive PPSV23 vaccine. One-time revaccination 5 years after the first dose of PPSV23 is recommended for people aged 19-64 years who have chronic kidney failure, nephrotic syndrome, asplenia, or immunocompromised conditions. People who received 1-2 doses of PPSV23 before age 65 years should receive another dose of PPSV23 vaccine at age 65 years or later if at least 5 years have passed since the previous dose. Doses of PPSV23 are not needed for people immunized with PPSV23 at or after age 65 years.  Meningococcal vaccine. Adults with asplenia or persistent complement component deficiencies should receive 2 doses of quadrivalent meningococcal conjugate (MenACWY-D) vaccine. The doses should be obtained at least 2 months apart.  Microbiologists working with certain meningococcal bacteria, military recruits, people at risk during an outbreak, and people who travel to or live in countries with a high rate of meningitis should be immunized. A first-year college student up through age   21 years who is living in a residence hall should receive a dose if she did not receive a dose on or after her 16th birthday. Adults who have certain high-risk conditions should receive one or more doses of vaccine.  Hepatitis A vaccine. Adults who wish to be protected from this disease, have certain high-risk conditions, work with hepatitis A-infected animals, work in hepatitis A research labs, or travel to or work in countries with a high rate of hepatitis A should be immunized. Adults who were previously unvaccinated and who anticipate close contact with an international adoptee during the first 60 days after arrival in the [place] from a country with a high rate of hepatitis A should be immunized.  Hepatitis B vaccine. Adults who wish to be protected from this disease, have certain high-risk conditions, may be exposed to blood or other infectious body fluids, are household contacts or sex partners of hepatitis B positive people, are clients or workers in certain care facilities, or travel to or work in countries with a high rate of hepatitis B should be immunized.  Haemophilus influenzae type b (Hib) vaccine. A previously unvaccinated person with asplenia or sickle cell disease or having a scheduled splenectomy should receive 1 dose of Hib vaccine. Regardless of previous immunization, a recipient of a hematopoietic stem cell transplant should receive a 3-dose series 6-12 months after her successful transplant. Hib vaccine is not recommended for adults with HIV infection. Preventive Services / Frequency Ages 64 to 68 years  Blood pressure check.** / Every 1 to 2 years.  Lipid and cholesterol check.** / Every 5 years beginning at age  22.  Clinical breast exam.** / Every 3 years for women in their 88s and 53s.  BRCA-related cancer risk assessment.** / For women who have family members with a BRCA-related cancer (breast, ovarian, tubal, or peritoneal cancers).  Pap test.** / Every 2 years from ages 90 through 51. Every 3 years starting at age 21 through age 56 or 3 with a history of 3 consecutive normal Pap tests.  HPV screening.** / Every 3 years from ages 24 through ages 1 to 46 with a history of 3 consecutive normal Pap tests.  Hepatitis C blood test.** / For any individual with known risks for hepatitis C.  Skin self-exam. / Monthly.  Influenza vaccine. / Every year.  Tetanus, diphtheria, and acellular pertussis (Tdap, Td) vaccine.** / Consult your health care provider. Pregnant women should receive 1 dose of Tdap vaccine during each pregnancy. 1 dose of Td every 10 years.  Varicella vaccine.** / Consult your health care provider. Pregnant females who do not have evidence of immunity should receive the first dose after pregnancy.  HPV vaccine. / 3 doses over 6 months, if 72 and younger. The vaccine is not recommended for use in pregnant females. However, pregnancy testing is not needed before receiving a dose.  Measles, mumps, rubella (MMR) vaccine.** / You need at least 1 dose of MMR if you were born in 1957 or later. You may also need a 2nd dose. For females of childbearing age, rubella immunity should be determined. If there is no evidence of immunity, females who are not pregnant should be vaccinated. If there is no evidence of immunity, females who are pregnant should delay immunization until after pregnancy.  Pneumococcal 13-valent conjugate (PCV13) vaccine.** / Consult your health care provider.  Pneumococcal polysaccharide (PPSV23) vaccine.** / 1 to 2 doses if you smoke cigarettes or if you have certain conditions.  Meningococcal vaccine.** /  1 dose if you are age 19 to 21 years and a first-year college  student living in a residence hall, or have one of several medical conditions, you need to get vaccinated against meningococcal disease. You may also need additional booster doses.  Hepatitis A vaccine.** / Consult your health care provider.  Hepatitis B vaccine.** / Consult your health care provider.  Haemophilus influenzae type b (Hib) vaccine.** / Consult your health care provider. Ages 40 to 64 years  Blood pressure check.** / Every 1 to 2 years.  Lipid and cholesterol check.** / Every 5 years beginning at age 20 years.  Lung cancer screening. / Every year if you are aged 55-80 years and have a 30-pack-year history of smoking and currently smoke or have quit within the past 15 years. Yearly screening is stopped once you have quit smoking for at least 15 years or develop a health problem that would prevent you from having lung cancer treatment.  Clinical breast exam.** / Every year after age 40 years.  BRCA-related cancer risk assessment.** / For women who have family members with a BRCA-related cancer (breast, ovarian, tubal, or peritoneal cancers).  Mammogram.** / Every year beginning at age 40 years and continuing for as long as you are in good health. Consult with your health care provider.  Pap test.** / Every 3 years starting at age 30 years through age 65 or 70 years with a history of 3 consecutive normal Pap tests.  HPV screening.** / Every 3 years from ages 30 years through ages 65 to 70 years with a history of 3 consecutive normal Pap tests.  Fecal occult blood test (FOBT) of stool. / Every year beginning at age 50 years and continuing until age 75 years. You may not need to do this test if you get a colonoscopy every 10 years.  Flexible sigmoidoscopy or colonoscopy.** / Every 5 years for a flexible sigmoidoscopy or every 10 years for a colonoscopy beginning at age 50 years and continuing until age 75 years.  Hepatitis C blood test.** / For all people born from 1945 through  1965 and any individual with known risks for hepatitis C.  Skin self-exam. / Monthly.  Influenza vaccine. / Every year.  Tetanus, diphtheria, and acellular pertussis (Tdap/Td) vaccine.** / Consult your health care provider. Pregnant women should receive 1 dose of Tdap vaccine during each pregnancy. 1 dose of Td every 10 years.  Varicella vaccine.** / Consult your health care provider. Pregnant females who do not have evidence of immunity should receive the first dose after pregnancy.  Zoster vaccine.** / 1 dose for adults aged 60 years or older.  Measles, mumps, rubella (MMR) vaccine.** / You need at least 1 dose of MMR if you were born in 1957 or later. You may also need a 2nd dose. For females of childbearing age, rubella immunity should be determined. If there is no evidence of immunity, females who are not pregnant should be vaccinated. If there is no evidence of immunity, females who are pregnant should delay immunization until after pregnancy.  Pneumococcal 13-valent conjugate (PCV13) vaccine.** / Consult your health care provider.  Pneumococcal polysaccharide (PPSV23) vaccine.** / 1 to 2 doses if you smoke cigarettes or if you have certain conditions.  Meningococcal vaccine.** / Consult your health care provider.  Hepatitis A vaccine.** / Consult your health care provider.  Hepatitis B vaccine.** / Consult your health care provider.  Haemophilus influenzae type b (Hib) vaccine.** / Consult your health care provider. Ages 65   years and over  Blood pressure check.** / Every 1 to 2 years.  Lipid and cholesterol check.** / Every 5 years beginning at age 71 years.  Lung cancer screening. / Every year if you are aged 15-80 years and have a 30-pack-year history of smoking and currently smoke or have quit within the past 15 years. Yearly screening is stopped once you have quit smoking for at least 15 years or develop a health problem that would prevent you from having lung cancer  treatment.  Clinical breast exam.** / Every year after age 13 years.  BRCA-related cancer risk assessment.** / For women who have family members with a BRCA-related cancer (breast, ovarian, tubal, or peritoneal cancers).  Mammogram.** / Every year beginning at age 75 years and continuing for as long as you are in good health. Consult with your health care provider.  Pap test.** / Every 3 years starting at age 31 years through age 66 or 10 years with 3 consecutive normal Pap tests. Testing can be stopped between 65 and 70 years with 3 consecutive normal Pap tests and no abnormal Pap or HPV tests in the past 10 years.  HPV screening.** / Every 3 years from ages 52 years through ages 74 or 75 years with a history of 3 consecutive normal Pap tests. Testing can be stopped between 65 and 70 years with 3 consecutive normal Pap tests and no abnormal Pap or HPV tests in the past 10 years.  Fecal occult blood test (FOBT) of stool. / Every year beginning at age 51 years and continuing until age 29 years. You may not need to do this test if you get a colonoscopy every 10 years.  Flexible sigmoidoscopy or colonoscopy.** / Every 5 years for a flexible sigmoidoscopy or every 10 years for a colonoscopy beginning at age 59 years and continuing until age 31 years.  Hepatitis C blood test.** / For all people born from [birth year] through 1964 and any individual with known risks for hepatitis C.  Osteoporosis screening.** / A one-time screening for women ages 48 years and over and women at risk for fractures or osteoporosis.  Skin self-exam. / Monthly.  Influenza vaccine. / Every year.  Tetanus, diphtheria, and acellular pertussis (Tdap/Td) vaccine.** / 1 dose of Td every 10 years.  Varicella vaccine.** / Consult your health care provider.  Zoster vaccine.** / 1 dose for adults aged 23 years or older.  Pneumococcal 13-valent conjugate (PCV13) vaccine.** / Consult your health care provider.  Pneumococcal  polysaccharide (PPSV23) vaccine.** / 1 dose for all adults aged 21 years and older.  Meningococcal vaccine.** / Consult your health care provider.  Hepatitis A vaccine.** / Consult your health care provider.  Hepatitis B vaccine.** / Consult your health care provider.  Haemophilus influenzae type b (Hib) vaccine.** / Consult your health care provider. ** Family history and personal history of risk and conditions may change your health care provider's recommendations. Document Released: 01/14/2002 Document Revised: 04/04/2014 Document Reviewed: 04/15/2011 [facility] Patient Information 2015 [place], [place]. This information is not intended to replace advice given to you by your health care provider. Make sure you discuss any questions you have with your health care provider. Vaginitis Vaginitis is an inflammation of the vagina. It is most often caused by a change in the normal balance of the bacteria and yeast that live in the vagina. This change in balance causes an overgrowth of certain bacteria or yeast, which causes the inflammation. There are different types of vaginitis, but the  most common types are:  Bacterial vaginosis.  Yeast infection (candidiasis).  Trichomoniasis vaginitis. This is a sexually transmitted infection (STI).  Viral vaginitis.  Atropic vaginitis.  Allergic vaginitis. CAUSES  The cause depends on the type of vaginitis. Vaginitis can be caused by:  Bacteria (bacterial vaginosis).  Yeast (yeast infection).  A parasite (trichomoniasis vaginitis)  A virus (viral vaginitis).  Low hormone levels (atrophic vaginitis). Low hormone levels can occur during pregnancy, breastfeeding, or after menopause.  Irritants, such as bubble baths, scented tampons, and feminine sprays (allergic vaginitis). Other factors can change the normal balance of the yeast and bacteria that live in the vagina. These include:  Antibiotic medicines.  Poor hygiene.  Diaphragms, vaginal  sponges, spermicides, birth control pills, and intrauterine devices (IUD).  Sexual intercourse.  Infection.  Uncontrolled diabetes.  A weakened immune system. SYMPTOMS  Symptoms can vary depending on the cause of the vaginitis. Common symptoms include:  Abnormal vaginal discharge.  The discharge is white, gray, or yellow with bacterial vaginosis.  The discharge is thick, white, and cheesy with a yeast infection.  The discharge is frothy and yellow or greenish with trichomoniasis.  A bad vaginal odor.  The odor is fishy with bacterial vaginosis.  Vaginal itching, pain, or swelling.  Painful intercourse.  Pain or burning when urinating. Sometimes, there are no symptoms. TREATMENT  Treatment will vary depending on the type of infection.   Bacterial vaginosis and trichomoniasis are often treated with antibiotic creams or pills.  Yeast infections are often treated with antifungal medicines, such as vaginal creams or suppositories.  Viral vaginitis has no cure, but symptoms can be treated with medicines that relieve discomfort. Your sexual partner should be treated as well.  Atrophic vaginitis may be treated with an estrogen cream, pill, suppository, or vaginal ring. If vaginal dryness occurs, lubricants and moisturizing creams may help. You may be told to avoid scented soaps, sprays, or douches.  Allergic vaginitis treatment involves quitting the use of the product that is causing the problem. Vaginal creams can be used to treat the symptoms. HOME CARE INSTRUCTIONS   Take all medicines as directed by your caregiver.  Keep your genital area clean and dry. Avoid soap and only rinse the area with water.  Avoid douching. It can remove the healthy bacteria in the vagina.  Do not use tampons or have sexual intercourse until your vaginitis has been treated. Use sanitary pads while you have vaginitis.  Wipe from front to back. This avoids the spread of bacteria from the rectum  to the vagina.  Let air reach your genital area.  Wear cotton underwear to decrease moisture buildup.  Avoid wearing underwear while you sleep until your vaginitis is gone.  Avoid tight pants and underwear or nylons without a cotton panel.  Take off wet clothing (especially bathing suits) as soon as possible.  Use mild, non-scented products. Avoid using irritants, such as:  Scented feminine sprays.  Fabric softeners.  Scented detergents.  Scented tampons.  Scented soaps or bubble baths.  Practice safe sex and use condoms. Condoms may prevent the spread of trichomoniasis and viral vaginitis. SEEK MEDICAL CARE IF:   You have abdominal pain.  You have a fever or persistent symptoms for more than 2-3 days.  You have a fever and your symptoms suddenly get worse. Document Released: 09/15/2007 Document Revised: 08/12/2012 Document Reviewed: 04/30/2012 [facility] Patient Information 2015 [place], [place]. This information is not intended to replace advice given to you by your health care provider.  Make sure you discuss any questions you have with your health care provider.

## 2014-08-30 NOTE — Assessment & Plan Note (Signed)
Will check her PAP and wet prep and will treat as indicated

## 2014-08-30 NOTE — Assessment & Plan Note (Signed)
FLP today 

## 2014-08-30 NOTE — Progress Notes (Signed)
Subjective:    Patient ID: Veronica Garza, female    DOB: [birth year], 73 y.o.   MRN: [id number]  Hypertension This is a chronic problem. The current episode started more than 1 year ago. The problem is unchanged. The problem is controlled. Pertinent negatives include no anxiety, blurred vision, chest pain, headaches, malaise/fatigue, neck pain, orthopnea, palpitations, peripheral edema, PND, shortness of breath or sweats. Agents associated with hypertension include NSAIDs and estrogens. Past treatments include angiotensin blockers. The current treatment provides moderate improvement. Compliance problems include diet and exercise.       Review of Systems  Constitutional: Negative.  Negative for fever, chills, malaise/fatigue, diaphoresis, activity change, appetite change, fatigue and unexpected weight change.  HENT: Negative.   Eyes: Negative.  Negative for blurred vision.  Respiratory: Negative.  Negative for apnea, cough, choking, chest tightness, shortness of breath, wheezing and stridor.   Cardiovascular: Negative.  Negative for chest pain, palpitations, orthopnea, leg swelling and PND.  Gastrointestinal: Negative.  Negative for nausea, vomiting, abdominal pain, diarrhea, constipation, blood in stool, abdominal distention, anal bleeding and rectal pain.  Endocrine: Negative.   Genitourinary: Positive for vaginal discharge ("foul odor"). Negative for dysuria, urgency, frequency, hematuria, flank pain, decreased urine volume, vaginal bleeding, enuresis, difficulty urinating, genital sores, vaginal pain, menstrual problem, pelvic pain and dyspareunia.  Musculoskeletal: Positive for arthralgias. Negative for back pain, gait problem, joint swelling, neck pain and neck stiffness.  Skin: Negative.   Allergic/Immunologic: Negative.   Neurological: Negative.  Negative for headaches.  Hematological: Negative.  Negative for adenopathy. Does not bruise/bleed easily.  Psychiatric/Behavioral:  Negative.        Objective:   Physical Exam  Vitals reviewed. Constitutional: She is oriented to person, place, and time. She appears well-developed and well-nourished. No distress.  HENT:  Head: Normocephalic and atraumatic.  Mouth/Throat: Oropharynx is clear and moist. No oropharyngeal exudate.  Eyes: Conjunctivae are normal. Right eye exhibits no discharge. Left eye exhibits no discharge. No scleral icterus.  Neck: Normal range of motion. Neck supple. No JVD present. No tracheal deviation present. No thyromegaly present.  Cardiovascular: Normal rate, regular rhythm, normal heart sounds and intact distal pulses.  Exam reveals no gallop and no friction rub.   No murmur heard. Pulmonary/Chest: Effort normal and breath sounds normal. No stridor. No respiratory distress. She has no wheezes. She has no rales. She exhibits no tenderness.  Abdominal: Soft. Bowel sounds are normal. She exhibits no distension and no mass. There is no tenderness. There is no rebound and no guarding. Hernia confirmed negative in the right inguinal area and confirmed negative in the left inguinal area.  Genitourinary: Rectum normal and vagina normal. Rectal exam shows no external hemorrhoid, no internal hemorrhoid, no fissure, no mass, no tenderness and anal tone normal. Guaiac negative stool. No breast swelling, tenderness, discharge or bleeding. Pelvic exam was performed with patient supine. No labial fusion. There is no rash, tenderness or injury on the right labia. There is no rash, tenderness, lesion or injury on the left labia. Cervix exhibits no discharge. Right adnexum displays no mass, no tenderness and no fullness. Left adnexum displays no mass, no tenderness and no fullness. No erythema, tenderness or bleeding around the vagina. No foreign body around the vagina. No signs of injury around the vagina. No vaginal discharge found.  Musculoskeletal: Normal range of motion. She exhibits no edema and no tenderness.    Lymphadenopathy:    She has no cervical adenopathy.       Right:  No inguinal adenopathy present.       Left: No inguinal adenopathy present.  Neurological: She is oriented to person, place, and time.  Skin: Skin is warm and dry. No rash noted. She is not diaphoretic. No erythema. No pallor.  Psychiatric: She has a normal mood and affect. Her behavior is normal. Judgment and thought content normal.     Lab Results  Component Value Date   WBC 6.4 09/04/2012   HGB 13.6 09/04/2012   HCT 40.7 09/04/2012   PLT 206.0 09/04/2012   GLUCOSE 103* 05/27/2013   CHOL 202* 09/04/2012   TRIG 132.0 09/04/2012   HDL 58.50 09/04/2012   LDLDIRECT 119.5 09/04/2012   LDLCALC 107* 08/08/2011   ALT 32 09/04/2012   AST 27 09/04/2012   NA 137 05/27/2013   K 4.4 05/27/2013   CL 100 05/27/2013   CREATININE 0.9 05/27/2013   BUN 16 05/27/2013   CO2 27 05/27/2013   TSH 1.84 09/04/2012   INR 0.93 09/30/2011   HGBA1C 5.9 05/27/2013       Assessment & Plan:

## 2014-09-01 LAB — CYTOLOGY - PAP

## 2014-09-04 LAB — HM PAP SMEAR: HM PAP: NORMAL

## 2014-09-08 ENCOUNTER — Encounter: Payer: Medicare Other | Admitting: Internal Medicine

## 2014-09-15 ENCOUNTER — Encounter: Payer: Self-pay | Admitting: Gastroenterology

## 2014-09-22 ENCOUNTER — Other Ambulatory Visit: Payer: Self-pay | Admitting: Internal Medicine

## 2014-09-22 ENCOUNTER — Other Ambulatory Visit: Payer: Self-pay | Admitting: Dermatology

## 2014-09-22 DIAGNOSIS — L98499 Non-pressure chronic ulcer of skin of other sites with unspecified severity: Secondary | ICD-10-CM | POA: Diagnosis not present

## 2014-09-22 DIAGNOSIS — C44310 Basal cell carcinoma of skin of unspecified parts of face: Secondary | ICD-10-CM | POA: Diagnosis not present

## 2014-09-27 ENCOUNTER — Telehealth: Payer: Self-pay | Admitting: *Deleted

## 2014-09-27 NOTE — Telephone Encounter (Signed)
[facility]/pharmacist called and stated they never received response back on request for pt Estrotest that was sent on last Thursday. Inform [facility] per chart md approved 09/22/14 and was fax back to them. He stated they never received. Gave md authorization.Marland KitchenJohny Chess

## 2014-10-11 ENCOUNTER — Ambulatory Visit (INDEPENDENT_AMBULATORY_CARE_PROVIDER_SITE_OTHER): Payer: Medicare Other | Admitting: Pulmonary Disease

## 2014-10-11 ENCOUNTER — Encounter: Payer: Self-pay | Admitting: Pulmonary Disease

## 2014-10-11 VITALS — BP 128/80 | HR 89 | Ht 60.0 in | Wt 208.2 lb

## 2014-10-11 DIAGNOSIS — G4733 Obstructive sleep apnea (adult) (pediatric): Secondary | ICD-10-CM | POA: Diagnosis not present

## 2014-10-11 NOTE — Progress Notes (Signed)
   Subjective:    Patient ID: Veronica Garza, female    DOB: [birth year], 73 y.o.   MRN: [id number]  HPI   73 year old never smoker for FU of obstructive sleep apnea.  Poly somnogram in 2007 showed severe OSA with AHI of 64 events per hour, she weighed 195 pounds then. This was corrected by nasal CPAP of 13 cm.  She then underwent Bilateral ethmoidectomy and maxillary sinus ostial enlargement with antrostomies with reduction of turbinates.by Dr Ernesto Rutherford 2008 and following surgery,  CPAP pressure was decreased to 7 cm.  Her supplier was [facility]     10/11/2014  Chief Complaint  Patient presents with  . Follow-up    F/U OSA; has not had downloaded in a year; uses CPAP every night; approx 8 hours nightly; 7CM   On norco for arthritis Pt reports she wears her CPAP everynight x 7 hrs a night. Denies any problems with mask/machine/pressure. She takes occasional naps during the day. Most part feels rested during the day. Download 08/30/13 1 mnth on 7 c m >> no residuals, good usage, no leak Nasal mask makes mark on bridge of nose - AHP RT will come out in 11mnth to check She denies dryness of mouth, headaches or nasal stuffiness. Her weight is essentially unchanged since the sleep study     Review of Systems neg for any significant sore throat, dysphagia, itching, sneezing, nasal congestion or excess/ purulent secretions, fever, chills, sweats, unintended wt loss, pleuritic or exertional cp, hempoptysis, orthopnea pnd or change in chronic leg swelling. Also denies presyncope, palpitations, heartburn, abdominal pain, nausea, vomiting, diarrhea or change in bowel or urinary habits, dysuria,hematuria, rash, arthralgias, visual complaints, headache, numbness weakness or ataxia.     Objective:   Physical Exam  Gen. Pleasant, obese, in no distress ENT - no lesions, no post nasal drip Neck: No JVD, no thyromegaly, no carotid bruits Lungs: no use of accessory muscles, no dullness to  percussion, decreased without rales or rhonchi  Cardiovascular: Rhythm regular, heart sounds  normal, no murmurs or gallops, no peripheral edema Musculoskeletal: No deformities, no cyanosis or clubbing , no tremors       Assessment & Plan:

## 2014-10-11 NOTE — Patient Instructions (Signed)
Your CPAP is set at 7 cm Supplies will be renewed Call as needed

## 2014-10-11 NOTE — Assessment & Plan Note (Signed)
Your CPAP is set at 7 cm Supplies will be renewed Call as needed  Weight loss encouraged, compliance with goal of at least 4-6 hrs every night is the expectation. Advised against medications with sedative side effects Cautioned against driving when sleepy - understanding that sleepiness will vary on a day to day basis

## 2014-10-24 ENCOUNTER — Ambulatory Visit (INDEPENDENT_AMBULATORY_CARE_PROVIDER_SITE_OTHER): Payer: Medicare Other | Admitting: Internal Medicine

## 2014-10-24 ENCOUNTER — Encounter: Payer: Self-pay | Admitting: Internal Medicine

## 2014-10-24 ENCOUNTER — Other Ambulatory Visit (INDEPENDENT_AMBULATORY_CARE_PROVIDER_SITE_OTHER): Payer: Medicare Other

## 2014-10-24 ENCOUNTER — Telehealth: Payer: Self-pay | Admitting: Internal Medicine

## 2014-10-24 VITALS — BP 134/80 | HR 75 | Temp 98.3°F | Resp 16 | Ht 60.0 in | Wt 203.0 lb

## 2014-10-24 DIAGNOSIS — R55 Syncope and collapse: Secondary | ICD-10-CM | POA: Diagnosis not present

## 2014-10-24 DIAGNOSIS — I10 Essential (primary) hypertension: Secondary | ICD-10-CM

## 2014-10-24 DIAGNOSIS — R200 Anesthesia of skin: Secondary | ICD-10-CM | POA: Diagnosis not present

## 2014-10-24 DIAGNOSIS — R519 Headache, unspecified: Secondary | ICD-10-CM | POA: Insufficient documentation

## 2014-10-24 DIAGNOSIS — R51 Headache: Secondary | ICD-10-CM

## 2014-10-24 DIAGNOSIS — G4452 New daily persistent headache (NDPH): Secondary | ICD-10-CM | POA: Diagnosis not present

## 2014-10-24 DIAGNOSIS — R202 Paresthesia of skin: Secondary | ICD-10-CM

## 2014-10-24 LAB — COMPREHENSIVE METABOLIC PANEL
ALT: 27 U/L (ref 0–35)
AST: 23 U/L (ref 0–37)
Albumin: 4.2 g/dL (ref 3.5–5.2)
Alkaline Phosphatase: 106 U/L (ref 39–117)
BUN: 15 mg/dL (ref 6–23)
CO2: 29 mEq/L (ref 19–32)
Calcium: 10.5 mg/dL (ref 8.4–10.5)
Chloride: 100 mEq/L (ref 96–112)
Creatinine, Ser: 0.8 mg/dL (ref 0.4–1.2)
GFR: 76.04 mL/min (ref >60.00)
GLUCOSE: 94 mg/dL (ref 70–99)
Potassium: 4.4 mEq/L (ref 3.5–5.1)
SODIUM: 140 meq/L (ref 135–145)
Total Bilirubin: 0.4 mg/dL (ref 0.2–1.2)
Total Protein: 7.5 g/dL (ref 6.0–8.3)

## 2014-10-24 LAB — SEDIMENTATION RATE: SED RATE: 27 mm/h — AB (ref 0–22)

## 2014-10-24 LAB — CBC WITH DIFFERENTIAL/PLATELET
Basophils Absolute: 0.1 10*3/uL (ref 0.0–0.1)
Basophils Relative: 0.8 % (ref 0.0–3.0)
EOS PCT: 1.9 % (ref 0.0–5.0)
Eosinophils Absolute: 0.2 10*3/uL (ref 0.0–0.7)
HCT: 42.4 % (ref 36.0–46.0)
Hemoglobin: 14 g/dL (ref 12.0–15.0)
LYMPHS ABS: 1.5 10*3/uL (ref 0.7–4.0)
Lymphocytes Relative: 13.1 % (ref 12.0–46.0)
MCHC: 33.1 g/dL (ref 30.0–36.0)
MCV: 94.7 fl (ref 78.0–100.0)
Monocytes Absolute: 1 10*3/uL (ref 0.1–1.0)
Monocytes Relative: 8.7 % (ref 3.0–12.0)
Neutro Abs: 8.6 10*3/uL — ABNORMAL HIGH (ref 1.4–7.7)
Neutrophils Relative %: 75.5 % (ref 43.0–77.0)
PLATELETS: 205 10*3/uL (ref 150.0–400.0)
RBC: 4.48 Mil/uL (ref 3.87–5.11)
RDW: 13.7 % (ref 11.5–15.5)
WBC: 11.4 10*3/uL — AB (ref 4.0–10.5)

## 2014-10-24 LAB — TSH: TSH: 1.19 u[IU]/mL (ref 0.35–4.50)

## 2014-10-24 NOTE — Telephone Encounter (Signed)
Patient Information:  Caller Name: Britt Boozer  Phone: [phone]  Patient: Veronica Garza, Veronica Garza  Gender: Female  DOB: July 03, 1941  Age: 73 Years  PCP: Scarlette Calico (Adults only)  Office Follow Up:  Does the office need to follow up with this patient?: Yes  Instructions For The Office: PLEASE CALL PATIENT IF DR. Ronnald Ramp PREFERS FOR HER TO BE SEEN IN ED. APPNT SCHEDULED AND SHE PLANS TO COME TO OFFICE TO BE CHECKED AT 11:15 UNLESS IS TOLD OTHEWISE.  RN Note:  She has been having headaches since the episode on 10/21/14 and today is congested. PLEASE ASK DR. Ronnald Ramp IF OK TO BE SEEN IN OFFICE. SCHEDULED FOR 11:15 WITH DR. Ronnald Ramp- ED DISP.  Symptoms  Reason For Call & Symptoms: Had episode where she fainted on 10/21/14 when she was at restaurant out of town. She was unresponsive for about 10 min and when she woke up she was sweaty and L hand and both feet were numb and she felt drained. She was having trouble speaking. She went outside and sat in cool weather until felt better and then went home. Similar episode a few months ago in church where she had some numbness for 15 min and felt sweaty and groggy, but did not faint. SHE DID NOT GET CHECKED IN ER EITHER TIME.   Reviewed Health History In EMR: Yes  Reviewed Medications In EMR: Yes  Reviewed Allergies In EMR: Yes  Reviewed Surgeries / Procedures: Yes  Date of Onset of Symptoms: 10/21/2014  Guideline(s) Used:  Fainting  Disposition Per Guideline:   Go to ED Now (or to Office with PCP Approval)  Reason For Disposition Reached:   Age > 50 years  Advice Given:  Treatment:  Lie down with feet elevated for 10 minutes (Reason: simple fainting is due to temporarily decreased blood flow to the brain).  Expected Course  : Most adults with a simple faint are back to normal after lying down for 10 minutes.  Warning Symptoms for Fainting:  Fainting usually has early warning symptoms (e.g., dizziness, blurred vision, nausea, feeling cold, or warm).  If you feel these warning symptoms, immediately lie down to prevent falling down. You only have 5 seconds to act (Reason: almost impossible to faint when lying down).  Lying down (even on the floor) is less embarrassing than fainting, no matter where you are.  Sitting down (with head between knees) is certainly better than staying standing up but is less effective than lying down.  Call Back If:  You pass out again on the same day  You become worse.  Patient Will Follow Care Advice:  YES  Appointment Scheduled:  10/24/2014 11:15:00 Appointment Scheduled Provider:  Scarlette Calico (Adults only)

## 2014-10-24 NOTE — Progress Notes (Signed)
Pre visit review using our clinic review tool, if applicable. No additional management support is needed unless otherwise documented below in the visit note. 

## 2014-10-24 NOTE — Progress Notes (Signed)
Subjective:    Patient ID: Veronica Garza, female    DOB: 04-26-1941, 73 y.o.   MRN: [id number]  HPI  She complains that she has had a few spells over the last 2 months - she describes the sudden onset of lightheadedness and syncope with numbness in her left hand and both feet. Her husband says she is out cold for a few minutes and then suddenly returns to consciousness. She has felt well in between the spells with the exception of a bilateral frontal headache.   Review of Systems  Constitutional: Negative.  Negative for fever, chills, diaphoresis, appetite change and fatigue.  Eyes: Negative.   Respiratory: Negative.  Negative for cough, choking, chest tightness, shortness of breath, wheezing and stridor.   Cardiovascular: Negative.  Negative for chest pain, palpitations and leg swelling.  Gastrointestinal: Negative.  Negative for vomiting, abdominal pain, diarrhea and constipation.  Endocrine: Negative.   Genitourinary: Negative.   Musculoskeletal: Negative.  Negative for myalgias, back pain, joint swelling, arthralgias, gait problem, neck pain and neck stiffness.  Skin: Negative.   Allergic/Immunologic: Negative.   Neurological: Positive for dizziness, syncope, light-headedness, numbness and headaches. Negative for tremors, seizures, facial asymmetry, speech difficulty and weakness.  Hematological: Negative.  Negative for adenopathy. Does not bruise/bleed easily.  Psychiatric/Behavioral: Negative.        Objective:   Physical Exam  Constitutional: She is oriented to person, place, and time. She appears well-developed and well-nourished. No distress.  HENT:  Head: Normocephalic and atraumatic.  Mouth/Throat: Oropharynx is clear and moist. No oropharyngeal exudate.  Eyes: Conjunctivae are normal. Right eye exhibits no discharge. Left eye exhibits no discharge. No scleral icterus.  Neck: Trachea normal and normal range of motion. Neck supple. Normal carotid pulses, no  hepatojugular reflux and no JVD present. Carotid bruit is not present. No tracheal deviation present. No thyroid mass and no thyromegaly present.  Cardiovascular: Normal rate, regular rhythm, S1 normal, S2 normal, normal heart sounds and intact distal pulses.  Exam reveals no gallop, no distant heart sounds and no friction rub.   No murmur heard. Pulses:      Carotid pulses are 2+ on the right side, and 2+ on the left side.      Radial pulses are 2+ on the right side, and 2+ on the left side.       Femoral pulses are 2+ on the right side, and 2+ on the left side.      Popliteal pulses are 2+ on the right side, and 2+ on the left side.       Dorsalis pedis pulses are 2+ on the right side, and 2+ on the left side.       Posterior tibial pulses are 2+ on the right side, and 2+ on the left side.  Pulmonary/Chest: Effort normal and breath sounds normal. No stridor. No respiratory distress. She has no wheezes. She has no rales. She exhibits no tenderness.  Abdominal: Soft. Bowel sounds are normal. She exhibits no distension and no mass. There is no tenderness. There is no rebound and no guarding.  Musculoskeletal: Normal range of motion. She exhibits no edema or tenderness.  Lymphadenopathy:    She has no cervical adenopathy.  Neurological: She is alert and oriented to person, place, and time. She has normal strength. She displays no atrophy, no tremor and normal reflexes. No cranial nerve deficit or sensory deficit. She exhibits normal muscle tone. She displays a negative Romberg sign. She displays no seizure activity.  Coordination and gait normal. She displays no Babinski's sign on the right side. She displays no Babinski's sign on the left side.  Reflex Scores:      Tricep reflexes are 1+ on the right side and 1+ on the left side.      Bicep reflexes are 1+ on the right side and 1+ on the left side.      Brachioradialis reflexes are 1+ on the right side and 1+ on the left side.      Patellar  reflexes are 1+ on the right side and 1+ on the left side.      Achilles reflexes are 1+ on the right side and 1+ on the left side. Skin: Skin is warm and dry. No rash noted. She is not diaphoretic. No erythema. No pallor.  Psychiatric: She has a normal mood and affect. Her behavior is normal. Judgment and thought content normal.  Vitals reviewed.    Lab Results  Component Value Date   WBC 6.1 08/30/2014   HGB 13.6 08/30/2014   HCT 39.2 08/30/2014   PLT 198.0 08/30/2014   GLUCOSE 103* 08/30/2014   CHOL 192 08/30/2014   TRIG 138.0 08/30/2014   HDL 52.90 08/30/2014   LDLDIRECT 119.5 09/04/2012   LDLCALC 112* 08/30/2014   ALT 27 08/30/2014   AST 26 08/30/2014   NA 138 08/30/2014   K 4.4 08/30/2014   CL 104 08/30/2014   CREATININE 0.8 08/30/2014   BUN 19 08/30/2014   CO2 23 08/30/2014   TSH 0.79 08/30/2014   INR 0.93 09/30/2011   HGBA1C 5.7 08/30/2014       Assessment & Plan:

## 2014-10-24 NOTE — Patient Instructions (Signed)
Syncope °Syncope is a medical term for fainting or passing out. This means you lose consciousness and drop to the ground. People are generally unconscious for less than 5 minutes. You may have some muscle twitches for up to 15 seconds before waking up and returning to normal. Syncope occurs more often in older adults, but it can happen to anyone. While most causes of syncope are not dangerous, syncope can be a sign of a serious medical problem. It is important to seek medical care.  °CAUSES  °Syncope is caused by a sudden drop in blood flow to the brain. The specific cause is often not determined. Factors that can bring on syncope include: °· Taking medicines that lower blood pressure. °· Sudden changes in posture, such as standing up quickly. °· Taking more medicine than prescribed. °· Standing in one place for too long. °· Seizure disorders. °· Dehydration and excessive exposure to heat. °· Low blood sugar (hypoglycemia). °· Straining to have a bowel movement. °· Heart disease, irregular heartbeat, or other circulatory problems. °· Fear, emotional distress, seeing blood, or severe pain. °SYMPTOMS  °Right before fainting, you may: °· Feel dizzy or light-headed. °· Feel nauseous. °· See all white or all black in your field of vision. °· Have cold, clammy skin. °DIAGNOSIS  °Your health care provider will ask about your symptoms, perform a physical exam, and perform an electrocardiogram (ECG) to record the electrical activity of your heart. Your health care provider may also perform other heart or blood tests to determine the cause of your syncope which may include: °· Transthoracic echocardiogram (TTE). During echocardiography, sound waves are used to evaluate how blood flows through your heart. °· Transesophageal echocardiogram (TEE). °· Cardiac monitoring. This allows your health care provider to monitor your heart rate and rhythm in real time. °· Holter monitor. This is a portable device that records your  heartbeat and can help diagnose heart arrhythmias. It allows your health care provider to track your heart activity for several days, if needed. °· Stress tests by exercise or by giving medicine that makes the heart beat faster. °TREATMENT  °In most cases, no treatment is needed. Depending on the cause of your syncope, your health care provider may recommend changing or stopping some of your medicines. °HOME CARE INSTRUCTIONS °· Have someone stay with you until you feel stable. °· Do not drive, use machinery, or play sports until your health care provider says it is okay. °· Keep all follow-up appointments as directed by your health care provider. °· Lie down right away if you start feeling like you might faint. Breathe deeply and steadily. Wait until all the symptoms have passed. °· Drink enough fluids to keep your urine clear or pale yellow. °· If you are taking blood pressure or heart medicine, get up slowly and take several minutes to sit and then stand. This can reduce dizziness. °SEEK IMMEDIATE MEDICAL CARE IF:  °· You have a severe headache. °· You have unusual pain in the chest, abdomen, or back. °· You are bleeding from your mouth or rectum, or you have black or tarry stool. °· You have an irregular or very fast heartbeat. °· You have pain with breathing. °· You have repeated fainting or seizure-like jerking during an episode. °· You faint when sitting or lying down. °· You have confusion. °· You have trouble walking. °· You have severe weakness. °· You have vision problems. °If you fainted, call your local emergency services (911 in [place] Do not drive   yourself to the hospital.  °MAKE SURE YOU: °· Understand these instructions. °· Will watch your condition. °· Will get help right away if you are not doing well or get worse. °Document Released: 11/18/2005 Document Revised: 11/23/2013 Document Reviewed: 01/17/2012 °ExitCare® Patient Information ©2015 [employer]. This information is not intended to replace  advice given to you by your health care provider. Make sure you discuss any questions you have with your health care provider. ° °

## 2014-10-25 ENCOUNTER — Ambulatory Visit (INDEPENDENT_AMBULATORY_CARE_PROVIDER_SITE_OTHER)
Admission: RE | Admit: 2014-10-25 | Discharge: 2014-10-25 | Disposition: A | Discharge status: Home / Self Care | Payer: Medicare Other | Source: Ambulatory Visit | Attending: Internal Medicine | Admitting: Internal Medicine

## 2014-10-25 DIAGNOSIS — R51 Headache: Secondary | ICD-10-CM | POA: Diagnosis not present

## 2014-10-25 DIAGNOSIS — R55 Syncope and collapse: Secondary | ICD-10-CM | POA: Diagnosis not present

## 2014-10-25 DIAGNOSIS — G4452 New daily persistent headache (NDPH): Secondary | ICD-10-CM

## 2014-10-26 ENCOUNTER — Telehealth: Payer: Self-pay | Admitting: Internal Medicine

## 2014-10-26 NOTE — Telephone Encounter (Signed)
Patient would like results of blood work and brain scan.

## 2014-10-26 NOTE — Assessment & Plan Note (Addendum)
Exam is normal, there are no specific details that inform me as to the cause of these spells, there is no description of a post-ictal state so I don't think these are seizures. Her neuro exam is normal and CT scan of brain is normal. Her EKG shows only LAFB and NS changes in the T waves and she does not describe any cardiac s/s. Labs are all WNL.   Will get an ECHO and event monitor done as cardiac causes is my main concern. Will order an MRI to get better details about the brain as some things are missed on CT scan.

## 2014-10-26 NOTE — Telephone Encounter (Signed)
Patient notified of results.

## 2014-10-26 NOTE — Telephone Encounter (Signed)
CT scan was normal but an MRI will need to be done to get better detail of the brain Her labs were normal

## 2014-11-03 ENCOUNTER — Encounter (INDEPENDENT_AMBULATORY_CARE_PROVIDER_SITE_OTHER): Payer: Medicare Other

## 2014-11-03 ENCOUNTER — Encounter: Payer: Self-pay | Admitting: *Deleted

## 2014-11-03 DIAGNOSIS — R55 Syncope and collapse: Secondary | ICD-10-CM | POA: Diagnosis not present

## 2014-11-03 DIAGNOSIS — H26493 Other secondary cataract, bilateral: Secondary | ICD-10-CM | POA: Diagnosis not present

## 2014-11-03 DIAGNOSIS — G4452 New daily persistent headache (NDPH): Secondary | ICD-10-CM

## 2014-11-03 NOTE — Progress Notes (Signed)
Patient ID: Veronica Garza, female   DOB: November 15, 1941, 73 y.o.   MRN: [id number] [employer] verite 30 day cardiac event monitor applied to patient.

## 2014-11-04 ENCOUNTER — Ambulatory Visit (HOSPITAL_COMMUNITY): Payer: Medicare Other | Attending: Internal Medicine

## 2014-11-04 DIAGNOSIS — R51 Headache: Secondary | ICD-10-CM | POA: Diagnosis not present

## 2014-11-04 DIAGNOSIS — R55 Syncope and collapse: Secondary | ICD-10-CM | POA: Diagnosis not present

## 2014-11-04 DIAGNOSIS — G4452 New daily persistent headache (NDPH): Secondary | ICD-10-CM

## 2014-11-04 NOTE — Progress Notes (Signed)
2D Echo completed. 11/04/2014

## 2014-11-10 ENCOUNTER — Ambulatory Visit
Admission: RE | Admit: 2014-11-10 | Discharge: 2014-11-10 | Disposition: A | Discharge status: Home / Self Care | Payer: Medicare Other | Source: Ambulatory Visit | Attending: Internal Medicine | Admitting: Internal Medicine

## 2014-11-10 DIAGNOSIS — R51 Headache: Secondary | ICD-10-CM | POA: Diagnosis not present

## 2014-11-10 DIAGNOSIS — R200 Anesthesia of skin: Secondary | ICD-10-CM

## 2014-11-10 DIAGNOSIS — R202 Paresthesia of skin: Secondary | ICD-10-CM

## 2014-11-10 DIAGNOSIS — R55 Syncope and collapse: Secondary | ICD-10-CM | POA: Diagnosis not present

## 2014-11-10 DIAGNOSIS — G4452 New daily persistent headache (NDPH): Secondary | ICD-10-CM

## 2014-11-10 DIAGNOSIS — R42 Dizziness and giddiness: Secondary | ICD-10-CM | POA: Diagnosis not present

## 2014-11-12 ENCOUNTER — Encounter: Payer: Self-pay | Admitting: Internal Medicine

## 2014-11-13 ENCOUNTER — Encounter: Payer: Self-pay | Admitting: Internal Medicine

## 2014-11-13 ENCOUNTER — Other Ambulatory Visit: Payer: Self-pay | Admitting: Internal Medicine

## 2014-11-13 DIAGNOSIS — R55 Syncope and collapse: Secondary | ICD-10-CM

## 2014-11-13 DIAGNOSIS — I5189 Other ill-defined heart diseases: Secondary | ICD-10-CM | POA: Insufficient documentation

## 2014-11-14 DIAGNOSIS — H26491 Other secondary cataract, right eye: Secondary | ICD-10-CM | POA: Diagnosis not present

## 2014-12-07 ENCOUNTER — Encounter: Payer: Self-pay | Admitting: Cardiology

## 2014-12-07 ENCOUNTER — Ambulatory Visit (INDEPENDENT_AMBULATORY_CARE_PROVIDER_SITE_OTHER): Payer: Medicare Other | Admitting: Cardiology

## 2014-12-07 VITALS — BP 132/80 | HR 82 | Ht 60.0 in | Wt 205.0 lb

## 2014-12-07 DIAGNOSIS — R55 Syncope and collapse: Secondary | ICD-10-CM

## 2014-12-07 NOTE — Progress Notes (Signed)
[facility]. [address] [place],   [address] Phone: [phone] Fax:  [phone]  Date:  12/07/2014   ID:  Veronica Garza, DOB 03-Jun-1941, MRN [id number]  PCP:  Veronica Calico, MD   History of Present Illness: Veronica Garza is a 74 y.o. female here for evaluation of syncope. In review of office note from 10/24/14 from Dr. Ronnald Garza, she had a few spells over the past 2 months described as sudden onset lightheadedness and syncope with numbness in her left hand and both feet. She "totally sweats" hands and feet went numb. Knows she is about to go out. Mumbled. Seated in chair.   Her husband states that she is "cold" for a few minutes and then suddenly returned to consciousness. In between her spells, she feels well with exception of bilateral frontal headache. Last episode, happened at restaurant, prior to that church. Since then feels dull HA. Symptoms have been present for a year. Feels drained after for the rest of day. Drinks "alot" of water. Not on BP meds.   Exam has been unremarkable, no postictal state, EKG shows left anterior fascicular block and nonspecific T-wave changes and lab work was unremarkable.  Echocardiogram: 11/04/14- - Normal LV size with EF 55-60%. Normal RV size and systolic function. No significant valvular abnormalities. Mildly elevated PA pressure.   Wt Readings from Last 3 Encounters:  12/07/14 205 lb (92.987 kg)  10/24/14 203 lb (92.08 kg)  10/11/14 208 lb 3.2 oz (94.439 kg)     Past Medical History  Diagnosis Date  . Hypertension   . Anemia   . OSA (obstructive sleep apnea)   . GERD (gastroesophageal reflux disease)   . Arthritis   . Urinary incontinence   . Pneumonia     History of  . Cataract   . Hx of shoulder replacement     lt    Past Surgical History  Procedure Laterality Date  . Abdominal hysterectomy    . Vaginal sling    . Nasal sinus surgery    . Shoulder arthroscopy      Left  . Eye surgery      bilateral  cataract extraction  . Upper gastrointestinal endoscopy    . Total shoulder arthroplasty  10/10/2011    Procedure: TOTAL SHOULDER ARTHROPLASTY;  Surgeon: Veronica Garza;  Location: [facility];  Service: Orthopedics;  Laterality: Right;    Current Outpatient Prescriptions  Medication Sig Dispense Refill  . Calcium Carbonate-Vitamin D (CALTRATE 600+D) 600-400 MG-UNIT per tablet Take 2 tablets by mouth daily.     . EEMT HS 0.625-1.25 MG per tablet TAKE 1 TABLET DAILY 30 tablet 5  . HYDROcodone-acetaminophen (NORCO) 10-325 MG per tablet Take 1 tablet by mouth every 8 (eight) hours as needed. Takes 7.5/500 as needed 90 tablet 0  . Ibuprofen (ADVIL PO) Take by mouth. Take 2 to 4 daily prn    . Multiple Vitamins-Minerals (CENTRUM SILVER PO) Take by mouth.    Veronica Garza Glycol-Propyl Glycol (SYSTANE OP) Apply 1 drop to eye as directed. Into both eyes four times a day for dry eyes    . TURMERIC PO Take one by mouth daily     No current facility-administered medications for this visit.    Allergies:    Allergies  Allergen Reactions  . Iohexol      Code: HIVES, Desc: ? Contrast reaction from CT prior to 2000.   . Naproxen     REACTION: rash  .  Neomycin-Bacitracin Zn-Polymyx     REACTION: redness  . Sulfonamide Derivatives     REACTION: rash/tingling    Social History:  The patient  reports that she has never smoked. She has never used smokeless tobacco. She reports that she does not drink alcohol or use illicit drugs.   Family History  Problem Relation Age of Onset  . Cancer Father     Prostate cancer  . Cancer Sister     Ovarian cancer  . Diabetes Sister   . Alzheimer's disease Mother   . COPD Neg Hx   . Alcohol abuse Neg Hx   . Early death Neg Hx   . Heart disease Neg Hx   . Hyperlipidemia Neg Hx   . Hypertension Neg Hx   . Kidney disease Neg Hx   . Stroke Neg Hx    No early CAD in family, no SCD.   ROS:  Please see the history of present illness.   Arthritis, fainting  episodes, denies chest pain, shortness of breath, fevers, rashes, orthopnea.   All other systems reviewed and negative.   PHYSICAL EXAM: VS:  BP 132/80 mmHg  Pulse 82  Ht 5' (1.524 m)  Wt 205 lb (92.987 kg)  BMI 40.04 kg/m2 Well nourished, well developed, in no acute distress HEENT: normal, [place]/AT, EOMI Neck: no JVD, normal carotid upstroke, no bruit Cardiac:  normal S1, S2; RRR; no murmur Lungs:  clear to auscultation bilaterally, no wheezing, rhonchi or rales Abd: soft, nontender, no hepatomegaly, no bruitsOverweight Ext: no edema, 2+ distal pulses Skin: warm and dry GU: deferred Neuro: no focal abnormalities noted, AAO x 3  EKG:   10/24/14-sinus rhythm, left atrial enlargement, nonspecific ST-T wave changes, no QT prolongation.  ASSESSMENT AND PLAN:  1. Vasovagal syncope-her episodes are fairly classic for vasovagal syncope, prodrome of dizziness, diaphoresis, pallor with post syncope resulting in prolonged "fogginess ". She has been prone to this her entire life. She states that if she stubbed her toe she would faint. I have offered her conservative management strategies including aggressive hydration, low calorie sports drink, compression hose, liberalize salt intake, exercise. If she begins to feel prodrome, I have instructed her to lay down or place her head between her legs until symptoms pass. Her heart monitor was reassuring showing no adverse arrhythmias. Usually heart arrhythmias, syncope will not have a classic prodrome such as this. Also, echocardiogram was reassuring showing normal ejection fraction. Head CT was also unremarkable. Electrolytes were normal. I would like to continue to treat her conservatively and we'll see her back in 6 months to see how she is feeling.  Signed, Veronica Furbish, MD [facility]  12/07/2014 11:55 AM

## 2014-12-07 NOTE — Patient Instructions (Signed)
Your physician recommends that you continue on your current medications as directed. Please refer to the Current Medication list given to you today.  Your physician wants you to follow-up in: 6 months with Dr.Skains You will receive a reminder letter in the mail two months in advance. If you don't receive a letter, please call our office to schedule the follow-up appointment.  You are experiencing vasovagal syncope. Increase your fluid and salt intake. Try drinking sport drinks. Wearing compression stocking will also help. If you feel an episode coming on lay down if possible.

## 2015-06-20 DIAGNOSIS — H6063 Unspecified chronic otitis externa, bilateral: Secondary | ICD-10-CM | POA: Diagnosis not present

## 2015-06-20 DIAGNOSIS — H903 Sensorineural hearing loss, bilateral: Secondary | ICD-10-CM | POA: Diagnosis not present

## 2015-06-20 DIAGNOSIS — H9313 Tinnitus, bilateral: Secondary | ICD-10-CM | POA: Diagnosis not present

## 2015-06-21 ENCOUNTER — Encounter: Payer: Self-pay | Admitting: Internal Medicine

## 2015-06-21 ENCOUNTER — Ambulatory Visit (INDEPENDENT_AMBULATORY_CARE_PROVIDER_SITE_OTHER): Payer: Medicare Other | Admitting: Internal Medicine

## 2015-06-21 ENCOUNTER — Other Ambulatory Visit (INDEPENDENT_AMBULATORY_CARE_PROVIDER_SITE_OTHER): Payer: Medicare Other

## 2015-06-21 ENCOUNTER — Ambulatory Visit (INDEPENDENT_AMBULATORY_CARE_PROVIDER_SITE_OTHER)
Admission: RE | Admit: 2015-06-21 | Discharge: 2015-06-21 | Disposition: A | Discharge status: Home / Self Care | Payer: Medicare Other | Source: Ambulatory Visit | Attending: Internal Medicine | Admitting: Internal Medicine

## 2015-06-21 VITALS — BP 128/76 | HR 69 | Temp 98.0°F | Resp 16 | Ht 60.0 in | Wt 205.0 lb

## 2015-06-21 DIAGNOSIS — M545 Low back pain, unspecified: Secondary | ICD-10-CM

## 2015-06-21 DIAGNOSIS — R1011 Right upper quadrant pain: Secondary | ICD-10-CM

## 2015-06-21 DIAGNOSIS — M5136 Other intervertebral disc degeneration, lumbar region: Secondary | ICD-10-CM | POA: Diagnosis not present

## 2015-06-21 DIAGNOSIS — R109 Unspecified abdominal pain: Secondary | ICD-10-CM

## 2015-06-21 DIAGNOSIS — G8929 Other chronic pain: Secondary | ICD-10-CM | POA: Diagnosis not present

## 2015-06-21 DIAGNOSIS — M47816 Spondylosis without myelopathy or radiculopathy, lumbar region: Secondary | ICD-10-CM | POA: Diagnosis not present

## 2015-06-21 LAB — URINALYSIS, ROUTINE W REFLEX MICROSCOPIC
Bilirubin Urine: NEGATIVE
Hgb urine dipstick: NEGATIVE
Ketones, ur: NEGATIVE
Nitrite: NEGATIVE
RBC / HPF: NONE SEEN
Specific Gravity, Urine: 1.01
Total Protein, Urine: NEGATIVE
Urine Glucose: NEGATIVE
Urobilinogen, UA: 0.2
pH: 6.5 (ref 5.0–8.0)

## 2015-06-21 LAB — COMPREHENSIVE METABOLIC PANEL
ALBUMIN: 4.1 g/dL (ref 3.5–5.2)
ALK PHOS: 106 U/L (ref 39–117)
ALT: 22 U/L (ref 0–35)
AST: 20 U/L (ref 0–37)
BILIRUBIN TOTAL: 0.5 mg/dL (ref 0.2–1.2)
BUN: 21 mg/dL (ref 6–23)
CO2: 34 mEq/L — ABNORMAL HIGH (ref 19–32)
CREATININE: 0.85 mg/dL (ref 0.40–1.20)
Calcium: 9.7 mg/dL (ref 8.4–10.5)
Chloride: 101 mEq/L (ref 96–112)
GFR: 69.75 mL/min (ref >60.00)
Glucose, Bld: 99 mg/dL (ref 70–99)
Potassium: 4.6 mEq/L (ref 3.5–5.1)
Sodium: 139 mEq/L (ref 135–145)
TOTAL PROTEIN: 6.8 g/dL (ref 6.0–8.3)

## 2015-06-21 LAB — CBC WITH DIFFERENTIAL/PLATELET
BASOS PCT: 0.3 % (ref 0.0–3.0)
Basophils Absolute: 0 10*3/uL (ref 0.0–0.1)
EOS ABS: 0.2 10*3/uL (ref 0.0–0.7)
Eosinophils Relative: 2.9 % (ref 0.0–5.0)
HEMATOCRIT: 39.3 % (ref 36.0–46.0)
Hemoglobin: 13.5 g/dL (ref 12.0–15.0)
LYMPHS ABS: 1.9 10*3/uL (ref 0.7–4.0)
Lymphocytes Relative: 27.8 % (ref 12.0–46.0)
MCHC: 34.5 g/dL (ref 30.0–36.0)
MCV: 93.5 fl (ref 78.0–100.0)
MONO ABS: 0.7 10*3/uL (ref 0.1–1.0)
Monocytes Relative: 10.7 % (ref 3.0–12.0)
Neutro Abs: 3.9 10*3/uL (ref 1.4–7.7)
Neutrophils Relative %: 58.3 % (ref 43.0–77.0)
Platelets: 192 10*3/uL (ref 150.0–400.0)
RBC: 4.2 Mil/uL (ref 3.87–5.11)
RDW: 14 % (ref 11.5–15.5)
WBC: 6.7 10*3/uL (ref 4.0–10.5)

## 2015-06-21 NOTE — Progress Notes (Signed)
Pre visit review using our clinic review tool, if applicable. No additional management support is needed unless otherwise documented below in the visit note. 

## 2015-06-21 NOTE — Patient Instructions (Signed)
Flank Pain °Flank pain refers to pain that is located on the side of the body between the upper abdomen and the back. The pain may occur over a short period of time (acute) or may be long-term or reoccurring (chronic). It may be mild or severe. Flank pain can be caused by many things. °CAUSES  °Some of the more common causes of flank pain include: °· Muscle strains.   °· Muscle spasms.   °· A disease of your spine (vertebral disk disease).   °· A lung infection (pneumonia).   °· Fluid around your lungs (pulmonary edema).   °· A kidney infection.   °· Kidney stones.   °· A very painful skin rash caused by the chickenpox virus (shingles).   °· Gallbladder disease.   °HOME CARE INSTRUCTIONS  °Home care will depend on the cause of your pain. In general, °· Rest as directed by your caregiver. °· Drink enough fluids to keep your urine clear or pale yellow. °· Only take over-the-counter or prescription medicines as directed by your caregiver. Some medicines may help relieve the pain. °· Tell your caregiver about any changes in your pain. °· Follow up with your caregiver as directed. °SEEK IMMEDIATE MEDICAL CARE IF:  °· Your pain is not controlled with medicine.   °· You have new or worsening symptoms. °· Your pain increases.   °· You have abdominal pain.   °· You have shortness of breath.   °· You have persistent nausea or vomiting.   °· You have swelling in your abdomen.   °· You feel faint or pass out.   °· You have blood in your urine. °· You have a fever or persistent symptoms for more than 2-3 days. °· You have a fever and your symptoms suddenly get worse. °MAKE SURE YOU:  °· Understand these instructions. °· Will watch your condition. °· Will get help right away if you are not doing well or get worse. °Document Released: 01/09/2006 Document Revised: 08/12/2012 Document Reviewed: 07/02/2012 °ExitCare® Patient Information ©2015 [employer]. This information is not intended to replace advice given to you by your  health care provider. Make sure you discuss any questions you have with your health care provider. ° °

## 2015-06-25 NOTE — Progress Notes (Signed)
Subjective:  Patient ID: Veronica Garza, female    DOB: February 19, 1941  Age: 74 y.o. MRN: [id number]  CC: Back Pain and Flank Pain   HPI Veronica Garza presents for the complaint of a 2 year history of pain in her right lower back and in her right side. She describes it is occasionally feeling like a knife is being stabbed in her right back and posterior flank. She takes Advil and hydrocodone and gets relief. She said sometimes it is also a stabbing sensation but the pain never radiates into the front or into her abdomen. She said sometimes the pain worsens at night and she can't find a comfortable position. She is not experiencing numbness, weakness, tingling in her legs.  Outpatient Prescriptions Prior to Visit  Medication Sig Dispense Refill  . Calcium Carbonate-Vitamin D (CALTRATE 600+D) 600-400 MG-UNIT per tablet Take 2 tablets by mouth daily.     . EEMT HS 0.625-1.25 MG per tablet TAKE 1 TABLET DAILY 30 tablet 5  . HYDROcodone-acetaminophen (NORCO) 10-325 MG per tablet Take 1 tablet by mouth every 8 (eight) hours as needed. Takes 7.5/500 as needed 90 tablet 0  . Ibuprofen (ADVIL PO) Take by mouth. Take 2 to 4 daily prn    . Multiple Vitamins-Minerals (CENTRUM SILVER PO) Take by mouth.    Vladimir Faster Glycol-Propyl Glycol (SYSTANE OP) Apply 1 drop to eye as directed. Into both eyes four times a day for dry eyes    . TURMERIC PO Take one by mouth daily     No facility-administered medications prior to visit.    ROS Review of Systems  Constitutional: Negative.  Negative for fever, chills, diaphoresis, appetite change and fatigue.  HENT: Negative.   Eyes: Negative.   Respiratory: Negative.  Negative for cough, choking, chest tightness, shortness of breath and stridor.   Cardiovascular: Negative.  Negative for chest pain, palpitations and leg swelling.  Gastrointestinal: Negative.  Negative for nausea, vomiting, abdominal pain, diarrhea, constipation and blood in stool.  Endocrine:  Negative.   Genitourinary: Positive for flank pain. Negative for dysuria, urgency, frequency, hematuria, decreased urine volume, pelvic pain and dyspareunia.  Musculoskeletal: Positive for back pain. Negative for joint swelling, arthralgias, gait problem, neck pain and neck stiffness.  Skin: Negative.  Negative for rash.  Allergic/Immunologic: Negative.   Neurological: Negative.   Hematological: Negative.  Negative for adenopathy. Does not bruise/bleed easily.  Psychiatric/Behavioral: Negative.     Objective:  BP 128/76 mmHg  Pulse 69  Temp(Src) 98 F (36.7 C) (Oral)  Resp 16  Ht 5' (1.524 m)  Wt 205 lb (92.987 kg)  BMI 40.04 kg/m2  SpO2 97%  BP Readings from Last 3 Encounters:  06/21/15 128/76  12/07/14 132/80  10/24/14 134/80    Wt Readings from Last 3 Encounters:  06/21/15 205 lb (92.987 kg)  12/07/14 205 lb (92.987 kg)  10/24/14 203 lb (92.08 kg)    Physical Exam  Constitutional: She is oriented to person, place, and time.  Non-toxic appearance. She does not have a sickly appearance. She does not appear ill. No distress.  HENT:  Mouth/Throat: Oropharynx is clear and moist. No oropharyngeal exudate.  Eyes: Conjunctivae are normal. Right eye exhibits no discharge. Left eye exhibits no discharge. No scleral icterus.  Neck: Normal range of motion. Neck supple. No JVD present. No tracheal deviation present. No thyromegaly present.  Cardiovascular: Normal rate, regular rhythm, normal heart sounds and intact distal pulses.  Exam reveals no gallop and no friction rub.  No murmur heard. Pulmonary/Chest: Effort normal and breath sounds normal. No accessory muscle usage or stridor. No respiratory distress. She has no decreased breath sounds. She has no wheezes. She has no rhonchi. She has no rales.   She exhibits no tenderness.  Abdominal: Soft. Bowel sounds are normal. She exhibits no distension and no mass. There is no tenderness. There is no rebound and no guarding.    Musculoskeletal: Normal range of motion. She exhibits no edema or tenderness.       Lumbar back: Normal. She exhibits normal range of motion, no tenderness, no bony tenderness, no swelling, no edema, no deformity, no laceration, no pain, no spasm and normal pulse.  Lymphadenopathy:    She has no cervical adenopathy.  Neurological: She is oriented to person, place, and time. She has normal strength. She displays no atrophy, no tremor and normal reflexes. No cranial nerve deficit or sensory deficit. She exhibits normal muscle tone. She displays a negative Romberg sign. She displays no seizure activity. Coordination and gait normal.  Reflex Scores:      Tricep reflexes are 0 on the right side and 0 on the left side.      Bicep reflexes are 0 on the right side and 0 on the left side.      Brachioradialis reflexes are 0 on the right side and 0 on the left side.      Patellar reflexes are 0 on the right side and 0 on the left side.      Achilles reflexes are 0 on the right side and 0 on the left side. Negative straight leg raise in bilateral lower extremities  Skin: Skin is warm and dry. No rash noted. She is not diaphoretic. No erythema. No pallor.  Vitals reviewed.   Lab Results  Component Value Date   WBC 6.7 06/21/2015   HGB 13.5 06/21/2015   HCT 39.3 06/21/2015   PLT 192.0 06/21/2015   GLUCOSE 99 06/21/2015   CHOL 192 08/30/2014   TRIG 138.0 08/30/2014   HDL 52.90 08/30/2014   LDLDIRECT 119.5 09/04/2012   LDLCALC 112* 08/30/2014   ALT 22 06/21/2015   AST 20 06/21/2015   NA 139 06/21/2015   K 4.6 06/21/2015   CL 101 06/21/2015   CREATININE 0.85 06/21/2015   BUN 21 06/21/2015   CO2 34* 06/21/2015   TSH 1.19 10/24/2014   INR 0.93 09/30/2011   HGBA1C 5.7 08/30/2014    Dg Lumbar Spine Complete  06/21/2015   CLINICAL DATA:  74 year old female with a history of lumbar back pain.  EXAM: LUMBAR SPINE - COMPLETE 4+ VIEW  COMPARISON:  None.  FINDINGS: Lumbar Spine:  Lumbar vertebral  elements maintain normal alignment without evidence of subluxation. Grade 1 anterolisthesis of L4 on L5.  Marland Kitchen  No fracture line identified.  Vertebral body heights maintained.  Multilevel degenerative disc disease. Most pronounced narrowing at the L2-L3, L3-L4 and L5-S1 levels. Vacuum disc phenomenon at the L3-L4 level.  Facet disease of the lower lumbar spine.  Oblique images demonstrate no evidence of displaced pars defect.  Unremarkable appearance of the visualized abdomen.  IMPRESSION: Negative for acute fracture or malalignment of the lumbar spine.  Multilevel degenerative disc disease and facet disease of the lumbar spine.  Signed,  Dulcy Fanny. Earleen Newport, DO  [facility]  [facility]   Electronically Signed   By: Corrie Mckusick D.O.   On: 06/21/2015 13:13   Dg Abd Acute W/chest  06/21/2015  CLINICAL DATA:  74 year old female with chronic right flank pain. Initial encounter.  EXAM: DG ABDOMEN ACUTE W/ 1V CHEST  COMPARISON:  Lumbar radiographs from today reported separately. CT Abdomen and Pelvis 09/14/2008 chest radiographs 09/30/2011  FINDINGS: Interval right shoulder arthroplasty since the prior chest series. Stable lung volumes. Cardiomegaly is not significantly changed. Other mediastinal contours are within normal limits. No pneumothorax or pneumoperitoneum. No acute pulmonary opacity or pleural effusion.  Non obstructed bowel gas pattern. Abdominal and pelvic visceral contours appear stable. Multiple pelvic phleboliths may have increased. Chronic degenerative changes in the lumbar spine.  IMPRESSION: 1.  Normal bowel gas pattern, no free air. 2.  No acute cardiopulmonary abnormality.   Electronically Signed   By: Genevie Ann M.D.   On: 06/21/2015 13:33    Assessment & Plan:   Veronica Garza was seen today for back pain and flank pain.  Diagnoses and all orders for this visit:  Right-sided low back pain without sciatica- her exam is normal but the plain x-rays show  multilevel degenerative disc disease. Will continue the medications for pain management. Orders: -     DG Lumbar Spine Complete; Future  Right flank pain, chronic- the plain x-ray of the area is normal, her labs and urinalysis are within normal limits. This appears to be referred pain from the degenerative disc disease in the lumbar spine. Will continue these medications for pain management. Orders: -     Comprehensive metabolic panel; Future -     CBC with Differential/Platelet; Future -     Urinalysis, Routine w reflex microscopic (not at [facility]); Future -     DG Abd Acute W/Chest; Future   I am having Veronica Garza maintain her Calcium Carbonate-Vitamin D, Polyethyl Glycol-Propyl Glycol (SYSTANE OP), Ibuprofen (ADVIL PO), TURMERIC PO, Multiple Vitamins-Minerals (CENTRUM SILVER PO), HYDROcodone-acetaminophen, EEMT HS, and alclomethasone.  Meds ordered this encounter  Medications  . alclomethasone (ACLOVATE) 0.05 % cream    Sig:      Follow-up: Return in about 3 weeks (around 07/12/2015).  Scarlette Calico, MD

## 2015-09-01 ENCOUNTER — Ambulatory Visit (INDEPENDENT_AMBULATORY_CARE_PROVIDER_SITE_OTHER): Payer: Medicare Other

## 2015-09-01 DIAGNOSIS — Z23 Encounter for immunization: Secondary | ICD-10-CM | POA: Diagnosis not present

## 2015-10-12 ENCOUNTER — Ambulatory Visit: Payer: Medicare Other | Admitting: Adult Health

## 2015-10-17 ENCOUNTER — Encounter: Payer: Self-pay | Admitting: Adult Health

## 2015-10-17 ENCOUNTER — Ambulatory Visit (INDEPENDENT_AMBULATORY_CARE_PROVIDER_SITE_OTHER): Payer: Medicare Other | Admitting: Adult Health

## 2015-10-17 VITALS — BP 128/78 | HR 74 | Temp 98.0°F | Ht 60.0 in | Wt 207.0 lb

## 2015-10-17 DIAGNOSIS — G4733 Obstructive sleep apnea (adult) (pediatric): Secondary | ICD-10-CM | POA: Diagnosis not present

## 2015-10-17 DIAGNOSIS — R590 Localized enlarged lymph nodes: Secondary | ICD-10-CM

## 2015-10-17 NOTE — Assessment & Plan Note (Signed)
Controlled on CPAP  Doing well  dlownload requested   Plan  Continue on CPAP At bedtime   Wear for at least 6hr each night .  Do not drive if sleepy.  Work on weight loss.  follow up Dr. Elsworth Soho  In 1 year and As needed

## 2015-10-17 NOTE — Progress Notes (Signed)
Subjective:    Patient ID: Veronica Garza, female    DOB: 05/28/1941, 74 y.o.   MRN: [id number]  HPI  74 year old female never smoker with known severe obstructive sleep apnea  TEST Tamera Reason  PSG 2007 >AHI 64/hr > nasal CPAP of 13 cm.  She then underwent Bilateral ethmoidectomy and maxillary sinus ostial enlargement with antrostomies with reduction of turbinates.by Dr Ernesto Rutherford 2008 and following surgery, CPAP pressure was decreased to 7 cm.  Her supplier was [employer]   10/17/2015  Follow-up obstructive sleep apnea  Patient returns for a one-year follow-up for sleep apnea. Patient says she is doing very well on C Pap and feels that it does benefit her. She wears her sleep apnea. She in for at least 6-7 hours every night. Says that her mask fits well and does not have any mask issues.  She denies any chest pain, orthopnea, PND or leg swelling. She denies any significant daytime sleepiness. CPAP  download has been requested from her homecare company Complains of sore lymph node along left neck . No redness. Been present for 2 weeks. No fever, cold symptoms or cough.    Past Medical History  Diagnosis Date  . Hypertension   . Anemia   . OSA (obstructive sleep apnea)   . GERD (gastroesophageal reflux disease)   . Arthritis   . Urinary incontinence   . Pneumonia     History of  . Cataract   . Hx of shoulder replacement     lt    Current Outpatient Prescriptions on File Prior to Visit  Medication Sig Dispense Refill  . Calcium Carbonate-Vitamin D (CALTRATE 600+D) 600-400 MG-UNIT per tablet Take 2 tablets by mouth daily.     . EEMT HS 0.625-1.25 MG per tablet TAKE 1 TABLET DAILY 30 tablet 5  . Ibuprofen (ADVIL PO) Take by mouth. Take 2 to 4 daily prn    . Multiple Vitamins-Minerals (CENTRUM SILVER PO) Take by mouth.    Vladimir Faster Glycol-Propyl Glycol (SYSTANE OP) Apply 1 drop to eye as directed. Into both eyes four times a day for dry eyes    . TURMERIC PO Take  one by mouth daily    . alclomethasone (ACLOVATE) 0.05 % cream     . HYDROcodone-acetaminophen (NORCO) 10-325 MG per tablet Take 1 tablet by mouth every 8 (eight) hours as needed. Takes 7.5/500 as needed (Patient not taking: Reported on 10/17/2015) 90 tablet 0   No current facility-administered medications on file prior to visit.    Review of Systems Constitutional:   No  weight loss, night sweats,  Fevers, chills, fatigue, or  lassitude.  HEENT:   No headaches,  Difficulty swallowing,  Tooth/dental problems, or  Sore throat,                No sneezing, itching, ear ache, nasal congestion, post nasal drip,   CV:  No chest pain,  Orthopnea, PND, swelling in lower extremities, anasarca, dizziness, palpitations, syncope.   GI  No heartburn, indigestion, abdominal pain, nausea, vomiting, diarrhea, change in bowel habits, loss of appetite, bloody stools.   Resp: No shortness of breath with exertion or at rest.  No excess mucus, no productive cough,  No non-productive cough,  No coughing up of blood.  No change in color of mucus.  No wheezing.  No chest wall deformity  Skin: no rash or lesions.  GU: no dysuria, change in color of urine, no urgency or frequency.  No flank pain,  no hematuria   MS:  No joint pain or swelling.  No decreased range of motion.  No back pain.  Psych:  No change in mood or affect. No depression or anxiety.  No memory loss.    ]    Objective:   Physical Exam  GEN: A/Ox3; pleasant , NAD, morbidly obese  VS reviewed   HEENT:  [place]/AT,  EACs-clear, TMs-wnl, NOSE-clear, THROAT-clear, no lesions, no postnasal drip or exudate noted. Class 2-3 MP airway .   NECK:  Supple w/ fair ROM; no JVD; normal carotid impulses w/o bruits; no thyromegaly or nodules palpated; along left lateral nek with small firm fixed palpable node. No redness noted.   RESP  Clear  P & A; w/o, wheezes/ rales/ or rhonchi.no accessory muscle use, no dullness to percussion  CARD:  RRR, no m/r/g   , tr peripheral edema, pulses intact, no cyanosis or clubbing., venous insuffficiency   GI:   Soft & nt; nml bowel sounds; no organomegaly or masses detected.  Musco: Warm bil, no deformities or joint swelling noted.   Neuro: alert, no focal deficits noted.    Skin: Warm, no lesions or rashes        Assessment & Plan:

## 2015-10-17 NOTE — Patient Instructions (Signed)
Continue on CPAP At bedtime   Wear for at least 6hr each night .  Do not drive if sleepy.  Work on weight loss.  follow up Dr. Elsworth Soho  In 1 year and As needed   Make appointment with Primary MD for swollen gland in neck.

## 2015-10-17 NOTE — Addendum Note (Signed)
Addended by: Osa Craver on: 10/17/2015 04:01 PM   Modules accepted: Orders

## 2015-10-17 NOTE — Progress Notes (Signed)
Reviewed & agree with plan  

## 2015-10-17 NOTE — Assessment & Plan Note (Signed)
Palpable cervical lymph node ? Etiology  She has not acute illness to explain  Nodes is tender but firm/fixed  Will refer to PCP to further evaluate  Please contact office for sooner follow up if symptoms do not improve or worsen or seek emergency care

## 2015-10-18 ENCOUNTER — Encounter: Payer: Self-pay | Admitting: Family

## 2015-10-18 ENCOUNTER — Ambulatory Visit (INDEPENDENT_AMBULATORY_CARE_PROVIDER_SITE_OTHER): Payer: Medicare Other | Admitting: Family

## 2015-10-18 VITALS — BP 120/78 | HR 70 | Temp 97.6°F | Resp 18 | Ht 60.0 in | Wt 208.0 lb

## 2015-10-18 DIAGNOSIS — R590 Localized enlarged lymph nodes: Secondary | ICD-10-CM | POA: Insufficient documentation

## 2015-10-18 NOTE — Progress Notes (Signed)
Subjective:    Patient ID: Veronica Garza, female    DOB: 03-15-1941, 74 y.o.   MRN: [id number]  Chief Complaint  Patient presents with  . Cyst    noticed on monday that she has a lump behind her left ear, feels like she is swollen there and the lump is sore to the touch    HPI:  Veronica Garza is a 74 y.o. female who  has a past medical history of Hypertension; Anemia; OSA (obstructive sleep apnea); GERD (gastroesophageal reflux disease); Arthritis; Urinary incontinence; Pneumonia; Cataract; and shoulder replacement. and presents today for an acute office visit.  1.) Knot / Cyst - Associated symptom of a lump located behind her left ear has been going on for about 2 days when she first noticed it. Describes it has swollen and sore to the touch. Believes that it may have increased in size slightly since she first noticed it. Denies any modifying factors that make it better or worse. Denies any sickness or illness lately. Denies fevers, chills, night sweats or loss of weight.    Allergies  Allergen Reactions  . Iohexol      Code: HIVES, Desc: ? Contrast reaction from CT prior to 2000.   . Naproxen     REACTION: rash  . Neomycin-Bacitracin Zn-Polymyx     REACTION: redness  . Sulfonamide Derivatives     REACTION: rash/tingling     Current Outpatient Prescriptions on File Prior to Visit  Medication Sig Dispense Refill  . alclomethasone (ACLOVATE) 0.05 % cream     . Calcium Carbonate-Vitamin D (CALTRATE 600+D) 600-400 MG-UNIT per tablet Take 2 tablets by mouth daily.     . EEMT HS 0.625-1.25 MG per tablet TAKE 1 TABLET DAILY 30 tablet 5  . Ibuprofen (ADVIL PO) Take by mouth. Take 2 to 4 daily prn    . Multiple Vitamins-Minerals (CENTRUM SILVER PO) Take by mouth.    Vladimir Faster Glycol-Propyl Glycol (SYSTANE OP) Apply 1 drop to eye as directed. Into both eyes four times a day for dry eyes    . TURMERIC PO Take one by mouth daily     No current facility-administered medications  on file prior to visit.    Review of Systems  Constitutional: Negative for fever, chills, diaphoresis and unexpected weight change.  HENT: Negative for congestion, dental problem, rhinorrhea, sinus pressure, sneezing, sore throat, tinnitus, trouble swallowing and voice change.   Neurological: Negative for headaches.      Objective:    BP 120/78 mmHg  Pulse 70  Temp(Src) 97.6 F (36.4 C) (Oral)  Resp 18  Ht 5' (1.524 m)  Wt 208 lb (94.348 kg)  BMI 40.62 kg/m2  SpO2 97% Nursing note and vital signs reviewed.  Physical Exam  Constitutional: She is oriented to person, place, and time. She appears well-developed and well-nourished. No distress.  Cardiovascular: Normal rate, regular rhythm, normal heart sounds and intact distal pulses.   Pulmonary/Chest: Effort normal and breath sounds normal.  Lymphadenopathy:  3-4 mm mass that is mildly tender, and non-mobile is located on her left side of her anterior cervical lymph chain.   Neurological: She is alert and oriented to person, place, and time.  Skin: Skin is warm and dry.  Psychiatric: She has a normal mood and affect. Her behavior is normal. Judgment and thought content normal.       Assessment & Plan:   Problem List Items Addressed This Visit      Immune and Lymphatic  Cervical lymphadenopathy - Primary    Exam consistent with possible anterior cervical lymph node. Obtain ultrasound to determine underlying structure or potential need for further imaging testing. Continue to monitor at this time as vital signs are stable with no evidence of infection.       Relevant Orders   US Soft Tissue Head/Neck

## 2015-10-18 NOTE — Patient Instructions (Addendum)
Thank you for choosing [facility].  Summary/Instructions:  If your symptoms worsen or fail to improve, please contact our office for further instruction, or in case of emergency go directly to the emergency room at the closest medical facility.   Lymphadenopathy Lymphadenopathy refers to swollen or enlarged lymph glands, also called lymph nodes. Lymph glands are part of your body's defense (immune) system, which protects the body from infections, germs, and diseases. Lymph glands are found in many locations in your body, including the neck, underarm, and groin.  Many things can cause lymph glands to become enlarged. When your immune system responds to germs, such as viruses or bacteria, infection-fighting cells and fluid build up. This causes the glands to grow in size. Usually, this is not something to worry about. The swelling and any soreness often go away without treatment. However, swollen lymph glands can also be caused by a number of diseases. Your health care provider may do various tests to help determine the cause. If the cause of your swollen lymph glands cannot be found, it is important to monitor your condition to make sure the swelling goes away. HOME CARE INSTRUCTIONS Watch your condition for any changes. The following actions may help to lessen any discomfort you are feeling:  Get plenty of rest.  Take medicines only as directed by your health care provider. Your health care provider may recommend over-the-counter medicines for pain.  Apply moist heat compresses to the site of swollen lymph nodes as directed by your health care provider. This can help reduce any pain.  Check your lymph nodes daily for any changes.  Keep all follow-up visits as directed by your health care provider. This is important. SEEK MEDICAL CARE IF:  Your lymph nodes are still swollen after 2 weeks.  Your swelling increases or spreads to other areas.  Your lymph nodes are hard, seem fixed to  the skin, or are growing rapidly.  Your skin over the lymph nodes is red and inflamed.  You have a fever.  You have chills.  You have fatigue.  You develop a sore throat.  You have abdominal pain.  You have weight loss.  You have night sweats. SEEK IMMEDIATE MEDICAL CARE IF:  You notice fluid leaking from the area of the enlarged lymph node.  You have severe pain in any area of your body.  You have chest pain.  You have shortness of breath.   This information is not intended to replace advice given to you by your health care provider. Make sure you discuss any questions you have with your health care provider.   Document Released: 08/27/2008 Document Revised: 12/09/2014 Document Reviewed: 06/23/2014 Elsevier Interactive Patient Education [employer].

## 2015-10-18 NOTE — Progress Notes (Signed)
Pre visit review using our clinic review tool, if applicable. No additional management support is needed unless otherwise documented below in the visit note. 

## 2015-10-18 NOTE — Assessment & Plan Note (Signed)
Exam consistent with possible anterior cervical lymph node. Obtain ultrasound to determine underlying structure or potential need for further imaging testing. Continue to monitor at this time as vital signs are stable with no evidence of infection.

## 2015-10-23 ENCOUNTER — Telehealth: Payer: Self-pay | Admitting: Family

## 2015-10-23 ENCOUNTER — Ambulatory Visit
Admission: RE | Admit: 2015-10-23 | Discharge: 2015-10-23 | Disposition: A | Discharge status: Home / Self Care | Payer: Medicare Other | Source: Ambulatory Visit | Attending: Family | Admitting: Family

## 2015-10-23 DIAGNOSIS — Z0389 Encounter for observation for other suspected diseases and conditions ruled out: Secondary | ICD-10-CM | POA: Diagnosis not present

## 2015-10-23 DIAGNOSIS — R590 Localized enlarged lymph nodes: Secondary | ICD-10-CM

## 2015-10-23 NOTE — Telephone Encounter (Signed)
Received results of abnormal ultrasound with recommended CT scan. Will contact patient regarding results.

## 2015-10-25 ENCOUNTER — Telehealth: Payer: Self-pay | Admitting: Family

## 2015-10-25 NOTE — Telephone Encounter (Signed)
Spoke with patient on the phone regarding ultrasound results and answered the questions that she had. A CT scan has been scheduled and further treatment will be determined pending those results.

## 2015-10-27 ENCOUNTER — Emergency Department (HOSPITAL_COMMUNITY)
Admission: EM | Admit: 2015-10-27 | Discharge: 2015-10-27 | Disposition: A | Discharge status: Home / Self Care | Payer: Medicare Other | Attending: Emergency Medicine | Admitting: Emergency Medicine

## 2015-10-27 ENCOUNTER — Emergency Department (HOSPITAL_COMMUNITY): Payer: Medicare Other

## 2015-10-27 ENCOUNTER — Encounter (HOSPITAL_COMMUNITY): Payer: Self-pay | Admitting: Emergency Medicine

## 2015-10-27 DIAGNOSIS — R110 Nausea: Secondary | ICD-10-CM | POA: Diagnosis not present

## 2015-10-27 DIAGNOSIS — M1990 Unspecified osteoarthritis, unspecified site: Secondary | ICD-10-CM | POA: Diagnosis not present

## 2015-10-27 DIAGNOSIS — M5136 Other intervertebral disc degeneration, lumbar region: Secondary | ICD-10-CM | POA: Diagnosis not present

## 2015-10-27 DIAGNOSIS — Z862 Personal history of diseases of the blood and blood-forming organs and certain disorders involving the immune mechanism: Secondary | ICD-10-CM | POA: Insufficient documentation

## 2015-10-27 DIAGNOSIS — Z8669 Personal history of other diseases of the nervous system and sense organs: Secondary | ICD-10-CM | POA: Diagnosis not present

## 2015-10-27 DIAGNOSIS — Z8701 Personal history of pneumonia (recurrent): Secondary | ICD-10-CM | POA: Insufficient documentation

## 2015-10-27 DIAGNOSIS — I10 Essential (primary) hypertension: Secondary | ICD-10-CM | POA: Insufficient documentation

## 2015-10-27 DIAGNOSIS — M545 Low back pain: Secondary | ICD-10-CM | POA: Diagnosis present

## 2015-10-27 DIAGNOSIS — Z79899 Other long term (current) drug therapy: Secondary | ICD-10-CM | POA: Insufficient documentation

## 2015-10-27 DIAGNOSIS — R109 Unspecified abdominal pain: Secondary | ICD-10-CM | POA: Insufficient documentation

## 2015-10-27 DIAGNOSIS — K5900 Constipation, unspecified: Secondary | ICD-10-CM | POA: Insufficient documentation

## 2015-10-27 DIAGNOSIS — R1011 Right upper quadrant pain: Secondary | ICD-10-CM | POA: Diagnosis not present

## 2015-10-27 LAB — URINALYSIS, ROUTINE W REFLEX MICROSCOPIC
Bilirubin Urine: NEGATIVE
Glucose, UA: NEGATIVE mg/dL
HGB URINE DIPSTICK: NEGATIVE
Ketones, ur: NEGATIVE mg/dL
Nitrite: NEGATIVE
PH: 7 (ref 5.0–8.0)
Protein, ur: NEGATIVE mg/dL
SPECIFIC GRAVITY, URINE: 1.011 (ref 1.005–1.030)

## 2015-10-27 LAB — URINE MICROSCOPIC-ADD ON

## 2015-10-27 LAB — CBC WITH DIFFERENTIAL/PLATELET
BASOS ABS: 0 10*3/uL (ref 0.0–0.1)
Basophils Relative: 0 %
EOS ABS: 0.1 10*3/uL (ref 0.0–0.7)
EOS PCT: 1 %
HCT: 40.3 % (ref 36.0–46.0)
Hemoglobin: 13.8 g/dL (ref 12.0–15.0)
LYMPHS PCT: 30 %
Lymphs Abs: 2.1 10*3/uL (ref 0.7–4.0)
MCH: 31.8 pg (ref 26.0–34.0)
MCHC: 34.2 g/dL (ref 30.0–36.0)
MCV: 92.9 fL (ref 78.0–100.0)
Monocytes Absolute: 0.5 10*3/uL (ref 0.1–1.0)
Monocytes Relative: 7 %
NEUTROS PCT: 62 %
Neutro Abs: 4.4 10*3/uL (ref 1.7–7.7)
PLATELETS: 186 10*3/uL (ref 150–400)
RBC: 4.34 MIL/uL (ref 3.87–5.11)
RDW: 13.2 % (ref 11.5–15.5)
WBC: 7.2 10*3/uL (ref 4.0–10.5)

## 2015-10-27 LAB — COMPREHENSIVE METABOLIC PANEL
ALT: 28 U/L (ref 14–54)
AST: 26 U/L (ref 15–41)
Albumin: 4.3 g/dL (ref 3.5–5.0)
Alkaline Phosphatase: 99 U/L (ref 38–126)
Anion gap: 8 (ref 5–15)
BUN: 15 mg/dL (ref 6–20)
CHLORIDE: 102 mmol/L (ref 101–111)
CO2: 29 mmol/L (ref 22–32)
CREATININE: 0.89 mg/dL (ref 0.44–1.00)
Calcium: 9.8 mg/dL (ref 8.9–10.3)
GFR calc Af Amer: >60 mL/min (ref >60)
GFR calc non Af Amer: >60 mL/min (ref >60)
GLUCOSE: 109 mg/dL — AB (ref 65–99)
Potassium: 4 mmol/L (ref 3.5–5.1)
SODIUM: 139 mmol/L (ref 135–145)
Total Bilirubin: 0.9 mg/dL (ref 0.3–1.2)
Total Protein: 7 g/dL (ref 6.5–8.1)

## 2015-10-27 LAB — LIPASE, BLOOD: LIPASE: 33 U/L (ref 11–51)

## 2015-10-27 MED ORDER — HYDROMORPHONE HCL 1 MG/ML IJ SOLN
1.0000 mg | Freq: Once | INTRAMUSCULAR | Status: AC
Start: 2015-10-27 — End: 2015-10-27
  Administered 2015-10-27: 1 mg via INTRAVENOUS
  Filled 2015-10-27: qty 1

## 2015-10-27 MED ORDER — TRAMADOL HCL 50 MG PO TABS: 50.0000 mg | ORAL_TABLET | Freq: Four times a day (QID) | ORAL | Status: DC | PRN

## 2015-10-27 MED ORDER — SODIUM CHLORIDE 0.9 % IV BOLUS (SEPSIS)
1000.0000 mL | Freq: Once | INTRAVENOUS | Status: AC
  Administered 2015-10-27: 1000 mL via INTRAVENOUS

## 2015-10-27 NOTE — ED Notes (Signed)
Pt. Stated, I've had rt. Back pain for 4 days , unable to sleep or rest.  Pt. Pointed to her rt. Flank area.

## 2015-10-27 NOTE — Discharge Instructions (Signed)
Abdominal Pain, Adult Many things can cause abdominal pain. Usually, abdominal pain is not caused by a disease and will improve without treatment. It can often be observed and treated at home. Your health care provider will do a physical exam and possibly order blood tests and X-rays to help determine the seriousness of your pain. However, in many cases, more time must pass before a clear cause of the pain can be found. Before that point, your health care provider may not know if you need more testing or further treatment. HOME CARE INSTRUCTIONS Monitor your abdominal pain for any changes. The following actions may help to alleviate any discomfort you are experiencing: 1. Only take over-the-counter or prescription medicines as directed by your health care provider. 2. Do not take laxatives unless directed to do so by your health care provider. 3. Try a clear liquid diet (broth, tea, or water) as directed by your health care provider. Slowly move to a bland diet as tolerated. SEEK MEDICAL CARE IF: 1. You have unexplained abdominal pain. 2. You have abdominal pain associated with nausea or diarrhea. 3. You have pain when you urinate or have a bowel movement. 4. You experience abdominal pain that wakes you in the night. 5. You have abdominal pain that is worsened or improved by eating food. 6. You have abdominal pain that is worsened with eating fatty foods. 7. You have a fever. SEEK IMMEDIATE MEDICAL CARE IF: 1. Your pain does not go away within 2 hours. 2. You keep throwing up (vomiting). 3. Your pain is felt only in portions of the abdomen, such as the right side or the left lower portion of the abdomen. 4. You pass bloody or black tarry stools. MAKE SURE YOU: 1. Understand these instructions. 2. Will watch your condition. 3. Will get help right away if you are not doing well or get worse.   This information is not intended to replace advice given to you by your health care provider. Make  sure you discuss any questions you have with your health care provider.   Document Released: 08/28/2005 Document Revised: 08/09/2015 Document Reviewed: 07/28/2013 Elsevier Interactive Patient Education 2016 Elsevier Inc.  Back Exercises The following exercises strengthen the muscles that help to support the back. They also help to keep the lower back flexible. Doing these exercises can help to prevent back pain or lessen existing pain. If you have back pain or discomfort, try doing these exercises 2-3 times each day or as told by your health care provider. When the pain goes away, do them once each day, but increase the number of times that you repeat the steps for each exercise (do more repetitions). If you do not have back pain or discomfort, do these exercises once each day or as told by your health care provider. EXERCISES Single Knee to Chest Repeat these steps 3-5 times for each leg: 4. Lie on your back on a firm bed or the floor with your legs extended. 5. Bring one knee to your chest. Your other leg should stay extended and in contact with the floor. 6. Hold your knee in place by grabbing your knee or thigh. 7. Pull on your knee until you feel a gentle stretch in your lower back. 8. Hold the stretch for 10-30 seconds. 9. Slowly release and straighten your leg. Pelvic Tilt Repeat these steps 5-10 times: 8. Lie on your back on a firm bed or the floor with your legs extended. 9. Bend your knees so they are  pointing toward the ceiling and your feet are flat on the floor. 10. Tighten your lower abdominal muscles to press your lower back against the floor. This motion will tilt your pelvis so your tailbone points up toward the ceiling instead of pointing to your feet or the floor. 11. With gentle tension and even breathing, hold this position for 5-10 seconds. Cat-Cow Repeat these steps until your lower back becomes more flexible: 5. Get into a hands-and-knees position on a firm surface.  Keep your hands under your shoulders, and keep your knees under your hips. You may place padding under your knees for comfort. 6. Let your head hang down, and point your tailbone toward the floor so your lower back becomes rounded like the back of a cat. 7. Hold this position for 5 seconds. 8. Slowly lift your head and point your tailbone up toward the ceiling so your back forms a sagging arch like the back of a cow. 9. Hold this position for 5 seconds. Press-Ups Repeat these steps 5-10 times: 4. Lie on your abdomen (face-down) on the floor. 5. Place your palms near your head, about shoulder-width apart. 6. While you keep your back as relaxed as possible and keep your hips on the floor, slowly straighten your arms to raise the top half of your body and lift your shoulders. Do not use your back muscles to raise your upper torso. You may adjust the placement of your hands to make yourself more comfortable. 7. Hold this position for 5 seconds while you keep your back relaxed. 8. Slowly return to lying flat on the floor. Bridges Repeat these steps 10 times: 1. Lie on your back on a firm surface. 2. Bend your knees so they are pointing toward the ceiling and your feet are flat on the floor. 3. Tighten your buttocks muscles and lift your buttocks off of the floor until your waist is at almost the same height as your knees. You should feel the muscles working in your buttocks and the back of your thighs. If you do not feel these muscles, slide your feet 1-2 inches farther away from your buttocks. 4. Hold this position for 3-5 seconds. 5. Slowly lower your hips to the starting position, and allow your buttocks muscles to relax completely. If this exercise is too easy, try doing it with your arms crossed over your chest. Abdominal Crunches Repeat these steps 5-10 times: 1. Lie on your back on a firm bed or the floor with your legs extended. 2. Bend your knees so they are pointing toward the ceiling and  your feet are flat on the floor. 3. Cross your arms over your chest. 4. Tip your chin slightly toward your chest without bending your neck. 5. Tighten your abdominal muscles and slowly raise your trunk (torso) high enough to lift your shoulder blades a tiny bit off of the floor. Avoid raising your torso higher than that, because it can put too much stress on your low back and it does not help to strengthen your abdominal muscles. 6. Slowly return to your starting position. Back Lifts Repeat these steps 5-10 times: 1. Lie on your abdomen (face-down) with your arms at your sides, and rest your forehead on the floor. 2. Tighten the muscles in your legs and your buttocks. 3. Slowly lift your chest off of the floor while you keep your hips pressed to the floor. Keep the back of your head in line with the curve in your back. Your eyes should be  looking at the floor. 4. Hold this position for 3-5 seconds. 5. Slowly return to your starting position. SEEK MEDICAL CARE IF:  Your back pain or discomfort gets much worse when you do an exercise.  Your back pain or discomfort does not lessen within 2 hours after you exercise. If you have any of these problems, stop doing these exercises right away. Do not do them again unless your health care provider says that you can. SEEK IMMEDIATE MEDICAL CARE IF:  You develop sudden, severe back pain. If this happens, stop doing the exercises right away. Do not do them again unless your health care provider says that you can.   This information is not intended to replace advice given to you by your health care provider. Make sure you discuss any questions you have with your health care provider.   Document Released: 12/26/2004 Document Revised: 08/09/2015 Document Reviewed: 01/12/2015 Elsevier Interactive Patient Education [employer].

## 2015-10-27 NOTE — ED Notes (Signed)
Pt able to dress independently and ambulate to wheelchair.   

## 2015-10-27 NOTE — ED Provider Notes (Signed)
CSN: [id number]     Arrival date & time 10/27/15  1039 History   First MD Initiated Contact with Patient 10/27/15 1213     Chief Complaint  Patient presents with  . Back Pain  . Flank Pain     (Consider location/radiation/quality/duration/timing/severity/associated sxs/prior Treatment) HPI Comments: Right side/flank pain 4 days constant sharp stabbing pain, severe, worsening Ice might help a little Eating does not makes it worse Rest doesn't help Nausea No vomiting/diarrhea/constipation/no urinary symptoms No hx of kidney stones    Patient is a 74 y.o. female presenting with back pain and flank pain.  Back Pain Associated symptoms: no abdominal pain, no chest pain, no dysuria, no fever and no headaches   Flank Pain Pertinent negatives include no chest pain, no abdominal pain, no headaches and no shortness of breath.    Past Medical History  Diagnosis Date  . Hypertension   . Anemia   . OSA (obstructive sleep apnea)   . GERD (gastroesophageal reflux disease)   . Arthritis   . Urinary incontinence   . Pneumonia     History of  . Cataract   . Hx of shoulder replacement     lt   Past Surgical History  Procedure Laterality Date  . Abdominal hysterectomy    . Vaginal sling    . Nasal sinus surgery    . Shoulder arthroscopy      Left  . Eye surgery      bilateral cataract extraction  . Upper gastrointestinal endoscopy    . Total shoulder arthroplasty  10/10/2011    Procedure: TOTAL SHOULDER ARTHROPLASTY;  Surgeon: Metta Clines Supple;  Location: [place];  Service: Orthopedics;  Laterality: Right;   Family History  Problem Relation Age of Onset  . Cancer Father     Prostate cancer  . Cancer Sister     Ovarian cancer  . Diabetes Sister   . Alzheimer's disease Mother   . COPD Neg Hx   . Alcohol abuse Neg Hx   . Early death Neg Hx   . Heart disease Neg Hx   . Hyperlipidemia Neg Hx   . Hypertension Neg Hx   . Kidney disease Neg Hx   . Stroke Neg Hx    Social  History  Substance Use Topics  . Smoking status: Never Smoker   . Smokeless tobacco: Never Used  . Alcohol Use: No   OB History    No data available     Review of Systems  Constitutional: Negative for fever.  HENT: Negative for sore throat.   Eyes: Negative for visual disturbance.  Respiratory: Negative for cough and shortness of breath.   Cardiovascular: Negative for chest pain.  Gastrointestinal: Positive for nausea and constipation. Negative for vomiting, abdominal pain and diarrhea.  Genitourinary: Positive for flank pain. Negative for dysuria, vaginal bleeding and difficulty urinating.  Musculoskeletal: Positive for back pain. Negative for neck pain.  Skin: Negative for rash.  Neurological: Negative for syncope and headaches.      Allergies  Iohexol; Naproxen; Neomycin-bacitracin zn-polymyx; and Sulfonamide derivatives  Home Medications   Prior to Admission medications   Medication Sig Start Date End Date Taking? Authorizing Provider  Calcium Carbonate-Vitamin D (CALTRATE 600+D) 600-400 MG-UNIT per tablet Take 1 tablet by mouth 2 (two) times daily.    Yes Historical Provider, MD  Ibuprofen (ADVIL PO) Take 200-400 mg by mouth 2 (two) times daily as needed (FOR PAIN). Take 2 to 4 daily prn   Yes Historical Provider,  MD  Multiple Vitamins-Minerals (CENTRUM SILVER PO) Take 0.5 tablets by mouth 2 (two) times daily.    Yes Historical Provider, MD  Polyethyl Glycol-Propyl Glycol (SYSTANE OP) Apply 1 drop to eye as directed. Into both eyes four times a day for dry eyes   Yes Historical Provider, MD  TURMERIC PO Take 1 tablet by mouth daily. Take one by mouth daily   Yes Historical Provider, MD  EEMT HS 0.625-1.25 MG per tablet TAKE 1 TABLET DAILY 09/22/14   Janith Lima, MD  traMADol (ULTRAM) 50 MG tablet Take 1 tablet (50 mg total) by mouth every 6 (six) hours as needed. 10/27/15   Gareth Morgan, MD   BP 176/87 mmHg  Pulse 84  Temp(Src) 98.7 F (37.1 C) (Oral)  Resp 16   SpO2 97% Physical Exam  Constitutional: She is oriented to person, place, and time. She appears well-developed and well-nourished. No distress.  HENT:  Head: Normocephalic and atraumatic.  Eyes: Conjunctivae and EOM are normal.  Neck: Normal range of motion.  Cardiovascular: Normal rate, regular rhythm, normal heart sounds and intact distal pulses.  Exam reveals no gallop and no friction rub.   No murmur heard. Pulmonary/Chest: Effort normal and breath sounds normal. No respiratory distress. She has no wheezes. She has no rales.  Abdominal: Soft. She exhibits no distension. There is tenderness (mild RUQ/side). There is CVA tenderness (right). There is no guarding, no tenderness at McBurney's point and negative Murphy's sign.  Musculoskeletal: She exhibits no edema or tenderness.  Neurological: She is alert and oriented to person, place, and time. She has normal strength. GCS eye subscore is 4. GCS verbal subscore is 5. GCS motor subscore is 6.  Skin: Skin is warm and dry. No rash noted. She is not diaphoretic. No erythema.  Nursing note and vitals reviewed.   ED Course  Procedures (including critical care time) Labs Review Labs Reviewed  URINALYSIS, ROUTINE W REFLEX MICROSCOPIC (NOT AT [facility]) - Abnormal; Notable for the following:    Leukocytes, UA TRACE (*)    All other components within normal limits  URINE MICROSCOPIC-ADD ON - Abnormal; Notable for the following:    Squamous Epithelial / LPF 0-5 (*)    Bacteria, UA RARE (*)    All other components within normal limits  COMPREHENSIVE METABOLIC PANEL - Abnormal; Notable for the following:    Glucose, Bld 109 (*)    All other components within normal limits  CBC WITH DIFFERENTIAL/PLATELET  LIPASE, BLOOD    Imaging Review Ct Renal Stone Study  10/27/2015  CLINICAL DATA:  Right flank pain. EXAM: CT ABDOMEN AND PELVIS WITHOUT CONTRAST TECHNIQUE: Multidetector CT imaging of the abdomen and pelvis was performed following the standard  protocol without IV contrast. COMPARISON:  CT scan of September 14, 2008. FINDINGS: Multilevel degenerative disc disease is noted in the lumbar spine. Stable 5 mm nodule is noted laterally in right lung base. No significant abnormality seen in the visualized lung bases. No gallstones are noted. No focal abnormality is noted in the liver, spleen or pancreas on these unenhanced images. Adrenal glands and kidneys appear normal. No hydronephrosis or renal obstruction is noted. No renal or ureteral calculi are noted. There is no evidence of abdominal aortic aneurysm. There is no evidence of bowel obstruction. No abnormal fluid collection is noted. Status post hysterectomy. Ovaries are unremarkable. Mild sigmoid diverticulosis is noted without inflammation. No significant adenopathy is noted. Urinary bladder appears normal. IMPRESSION: No hydronephrosis or renal obstruction is noted. No  renal or ureteral calculi are noted. Mild sigmoid diverticulosis is noted without inflammation. No acute abnormality seen in the abdomen or pelvis. Electronically Signed   By: Marijo Conception, M.D.   On: 10/27/2015 14:53   I have personally reviewed and evaluated these images and lab results as part of my medical decision-making.   EKG Interpretation None      MDM   Final diagnoses:  Right flank pain  Degenerative disc disease, lumbar   74 year old female with a history of strep obstructive sleep apnea, hypertension, GERD, presents with concern for right flank pain.  Differential diagnosis includes pyelonephritis, nephrolithiasis, cholecystitis, musculoskeletal.  History, exam, vital signs are not consistent with ruptured AAA, obstruction, appendicitis.  Urinalysis shows no signs of UTI. Pain is not worsened by eating, no fever/vomiting, and overall low suspicion for cholecystitis. CT stone study ordered and shows no signs of nephrolithiasis, no signs of cholecystitis (on noncontrast scan) or other abnormalities.  There are  signs of degenerative disc disease. Pain most likely musculoskeletal back pin.  Improved with dilaudid in ED.  Given rx for tramadol and recommend close outpt follow up. Patient discharged in stable condition with understanding of reasons to return.    Gareth Morgan, MD 10/27/15 1600

## 2015-10-31 ENCOUNTER — Telehealth: Payer: Self-pay | Admitting: Adult Health

## 2015-10-31 ENCOUNTER — Ambulatory Visit (INDEPENDENT_AMBULATORY_CARE_PROVIDER_SITE_OTHER): Payer: Medicare Other | Admitting: Internal Medicine

## 2015-10-31 ENCOUNTER — Ambulatory Visit (INDEPENDENT_AMBULATORY_CARE_PROVIDER_SITE_OTHER)
Admission: RE | Admit: 2015-10-31 | Discharge: 2015-10-31 | Disposition: A | Discharge status: Home / Self Care | Payer: Medicare Other | Source: Ambulatory Visit | Attending: Internal Medicine | Admitting: Internal Medicine

## 2015-10-31 ENCOUNTER — Encounter: Payer: Self-pay | Admitting: Internal Medicine

## 2015-10-31 VITALS — BP 128/84 | HR 71 | Temp 97.8°F | Resp 16 | Ht 60.0 in | Wt 204.0 lb

## 2015-10-31 DIAGNOSIS — M545 Low back pain, unspecified: Secondary | ICD-10-CM

## 2015-10-31 DIAGNOSIS — M5136 Other intervertebral disc degeneration, lumbar region: Secondary | ICD-10-CM | POA: Diagnosis not present

## 2015-10-31 MED ORDER — PREDNISONE 50 MG PO TABS: ORAL_TABLET | ORAL | Status: DC

## 2015-10-31 MED ORDER — OXYCODONE-ACETAMINOPHEN 7.5-325 MG PO TABS: 1.0000 | ORAL_TABLET | ORAL | Status: DC | PRN

## 2015-10-31 NOTE — Progress Notes (Signed)
Pre visit review using our clinic review tool, if applicable. No additional management support is needed unless otherwise documented below in the visit note. 

## 2015-10-31 NOTE — Telephone Encounter (Signed)
Per TP after reviewing CPAP download  Great job Keep up the good work  No changes to [employer] and spoke with pt. Reviewed results and recs. Patient voiced understanding and had no further questions. Nothing further needed. Will sign off on message.

## 2015-10-31 NOTE — Patient Instructions (Signed)

## 2015-11-01 ENCOUNTER — Encounter: Payer: Self-pay | Admitting: Internal Medicine

## 2015-11-01 ENCOUNTER — Ambulatory Visit (INDEPENDENT_AMBULATORY_CARE_PROVIDER_SITE_OTHER)
Admission: RE | Admit: 2015-11-01 | Discharge: 2015-11-01 | Disposition: A | Discharge status: Home / Self Care | Payer: Medicare Other | Source: Ambulatory Visit | Attending: Family | Admitting: Family

## 2015-11-01 DIAGNOSIS — R221 Localized swelling, mass and lump, neck: Secondary | ICD-10-CM | POA: Diagnosis not present

## 2015-11-01 DIAGNOSIS — R590 Localized enlarged lymph nodes: Secondary | ICD-10-CM

## 2015-11-01 MED ORDER — IOHEXOL 300 MG/ML  SOLN
80.0000 mL | Freq: Once | INTRAMUSCULAR | Status: AC | PRN
  Administered 2015-11-01: 80 mL via INTRAVENOUS
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
## 2015-11-02 ENCOUNTER — Telehealth: Payer: Self-pay | Admitting: Internal Medicine

## 2015-11-02 NOTE — Telephone Encounter (Signed)
Patient is calling regarding imaging results from 10/31/2015 and 11/01/2015. She is worried about the results, but there is no interpretation entered yet. Please contact the patient .

## 2015-11-03 NOTE — Telephone Encounter (Signed)
Pt is calling back regarding the encounter below. He is in terrible pain and she is upset she hasn't heard back yet.  She is aware Dr. Ronnald Ramp is not here today but can you please give her at call asap

## 2015-11-06 ENCOUNTER — Other Ambulatory Visit: Payer: Self-pay | Admitting: Internal Medicine

## 2015-11-06 DIAGNOSIS — M5136 Other intervertebral disc degeneration, lumbar region: Secondary | ICD-10-CM | POA: Insufficient documentation

## 2015-11-06 NOTE — Telephone Encounter (Signed)
Pt's son Remo Lipps called regarding how much pain pt is in and she is on a lot of pain meds. He can be reached at [phone]. Can you please asap

## 2015-11-06 NOTE — Telephone Encounter (Signed)
Patient is calling to advise that she would like the referral to a back specialist. No referral entered from what i see in the chart.

## 2015-11-06 NOTE — Telephone Encounter (Signed)
Please advise 

## 2015-11-06 NOTE — Telephone Encounter (Signed)
Veronica Garza called to follow up and file grievance. Spoke with him. Advised that i am working on getting clarification as to plan of care. He states that his mother is in significant pain. He is prepared to take her to the ED, but needs to hear as far as a plan of care.

## 2015-11-07 ENCOUNTER — Telehealth: Payer: Self-pay | Admitting: Family

## 2015-11-07 DIAGNOSIS — M5136 Other intervertebral disc degeneration, lumbar region: Secondary | ICD-10-CM

## 2015-11-07 DIAGNOSIS — R590 Localized enlarged lymph nodes: Secondary | ICD-10-CM

## 2015-11-07 DIAGNOSIS — R221 Localized swelling, mass and lump, neck: Secondary | ICD-10-CM

## 2015-11-07 NOTE — Telephone Encounter (Signed)
Spoke with patient regarding the results of her neck CT with the recommendations of a possible MRI. Spoke with neuroradiologist regarding MRI and indicates an MRI with/without contrast is appropriate. Also recommend follow up with ENT. Referral and MRI orders placed. Patient also inquired regarding her lumbar spine x-rays. Results were discussed and patient has been referred to pain medication per Dr. Ronnald Ramp. Instructed to continue medications as prescribed and follow up pending MRI results and referral.

## 2015-11-15 ENCOUNTER — Encounter: Payer: Self-pay | Admitting: Adult Health

## 2015-11-17 ENCOUNTER — Emergency Department (HOSPITAL_COMMUNITY)
Admission: EM | Admit: 2015-11-17 | Discharge: 2015-11-17 | Disposition: A | Discharge status: Home / Self Care | Payer: Medicare Other | Attending: Emergency Medicine | Admitting: Emergency Medicine

## 2015-11-17 ENCOUNTER — Telehealth: Payer: Self-pay | Admitting: Internal Medicine

## 2015-11-17 ENCOUNTER — Emergency Department (HOSPITAL_COMMUNITY): Payer: Medicare Other

## 2015-11-17 ENCOUNTER — Encounter (HOSPITAL_COMMUNITY): Payer: Self-pay | Admitting: *Deleted

## 2015-11-17 DIAGNOSIS — Z96619 Presence of unspecified artificial shoulder joint: Secondary | ICD-10-CM | POA: Insufficient documentation

## 2015-11-17 DIAGNOSIS — Z79818 Long term (current) use of other agents affecting estrogen receptors and estrogen levels: Secondary | ICD-10-CM | POA: Diagnosis not present

## 2015-11-17 DIAGNOSIS — M545 Low back pain, unspecified: Secondary | ICD-10-CM

## 2015-11-17 DIAGNOSIS — Z79899 Other long term (current) drug therapy: Secondary | ICD-10-CM | POA: Diagnosis not present

## 2015-11-17 DIAGNOSIS — Z862 Personal history of diseases of the blood and blood-forming organs and certain disorders involving the immune mechanism: Secondary | ICD-10-CM | POA: Diagnosis not present

## 2015-11-17 DIAGNOSIS — Z8719 Personal history of other diseases of the digestive system: Secondary | ICD-10-CM | POA: Insufficient documentation

## 2015-11-17 DIAGNOSIS — I10 Essential (primary) hypertension: Secondary | ICD-10-CM | POA: Insufficient documentation

## 2015-11-17 DIAGNOSIS — Z9841 Cataract extraction status, right eye: Secondary | ICD-10-CM | POA: Diagnosis not present

## 2015-11-17 DIAGNOSIS — Z8669 Personal history of other diseases of the nervous system and sense organs: Secondary | ICD-10-CM | POA: Insufficient documentation

## 2015-11-17 DIAGNOSIS — Z9842 Cataract extraction status, left eye: Secondary | ICD-10-CM | POA: Insufficient documentation

## 2015-11-17 DIAGNOSIS — M1990 Unspecified osteoarthritis, unspecified site: Secondary | ICD-10-CM | POA: Insufficient documentation

## 2015-11-17 DIAGNOSIS — Z8701 Personal history of pneumonia (recurrent): Secondary | ICD-10-CM | POA: Insufficient documentation

## 2015-11-17 DIAGNOSIS — N1330 Unspecified hydronephrosis: Secondary | ICD-10-CM | POA: Diagnosis not present

## 2015-11-17 DIAGNOSIS — M4806 Spinal stenosis, lumbar region: Secondary | ICD-10-CM | POA: Diagnosis not present

## 2015-11-17 DIAGNOSIS — N132 Hydronephrosis with renal and ureteral calculous obstruction: Secondary | ICD-10-CM | POA: Diagnosis not present

## 2015-11-17 DIAGNOSIS — R109 Unspecified abdominal pain: Secondary | ICD-10-CM

## 2015-11-17 LAB — URINALYSIS, ROUTINE W REFLEX MICROSCOPIC
Bilirubin Urine: NEGATIVE
Glucose, UA: NEGATIVE mg/dL
Hgb urine dipstick: NEGATIVE
KETONES UR: NEGATIVE mg/dL
LEUKOCYTES UA: NEGATIVE
NITRITE: NEGATIVE
PROTEIN: NEGATIVE mg/dL
Specific Gravity, Urine: 1.008 (ref 1.005–1.030)
pH: 6 (ref 5.0–8.0)

## 2015-11-17 LAB — COMPREHENSIVE METABOLIC PANEL
ALBUMIN: 3.6 g/dL (ref 3.5–5.0)
ALT: 26 U/L (ref 14–54)
AST: 37 U/L (ref 15–41)
Alkaline Phosphatase: 94 U/L (ref 38–126)
Anion gap: 11 (ref 5–15)
BILIRUBIN TOTAL: 0.8 mg/dL (ref 0.3–1.2)
BUN: 15 mg/dL (ref 6–20)
CO2: 24 mmol/L (ref 22–32)
Calcium: 9.6 mg/dL (ref 8.9–10.3)
Chloride: 104 mmol/L (ref 101–111)
Creatinine, Ser: 0.89 mg/dL (ref 0.44–1.00)
GFR calc Af Amer: >60 mL/min (ref >60)
GLUCOSE: 116 mg/dL — AB (ref 65–99)
POTASSIUM: 4.1 mmol/L (ref 3.5–5.1)
Sodium: 139 mmol/L (ref 135–145)
TOTAL PROTEIN: 6.6 g/dL (ref 6.5–8.1)

## 2015-11-17 LAB — CBC WITH DIFFERENTIAL/PLATELET
BASOS PCT: 0 %
Basophils Absolute: 0 10*3/uL (ref 0.0–0.1)
EOS PCT: 1 %
Eosinophils Absolute: 0.1 10*3/uL (ref 0.0–0.7)
HEMATOCRIT: 39.7 % (ref 36.0–46.0)
Hemoglobin: 13.8 g/dL (ref 12.0–15.0)
Lymphocytes Relative: 28 %
Lymphs Abs: 1.7 10*3/uL (ref 0.7–4.0)
MCH: 32.2 pg (ref 26.0–34.0)
MCHC: 34.8 g/dL (ref 30.0–36.0)
MCV: 92.8 fL (ref 78.0–100.0)
MONO ABS: 0.5 10*3/uL (ref 0.1–1.0)
MONOS PCT: 8 %
NEUTROS ABS: 3.7 10*3/uL (ref 1.7–7.7)
Neutrophils Relative %: 63 %
Platelets: 181 10*3/uL (ref 150–400)
RBC: 4.28 MIL/uL (ref 3.87–5.11)
RDW: 13.1 % (ref 11.5–15.5)
WBC: 5.9 10*3/uL (ref 4.0–10.5)

## 2015-11-17 LAB — LIPASE, BLOOD: LIPASE: 30 U/L (ref 11–51)

## 2015-11-17 MED ORDER — DIAZEPAM 2 MG PO TABS
2.0000 mg | ORAL_TABLET | Freq: Once | ORAL | Status: AC
Start: 2015-11-17 — End: 2015-11-17
  Administered 2015-11-17: 2 mg via ORAL
  Filled 2015-11-17: qty 1

## 2015-11-17 MED ORDER — HYDROMORPHONE HCL 1 MG/ML IJ SOLN
0.5000 mg | Freq: Once | INTRAMUSCULAR | Status: AC
  Administered 2015-11-17: 0.5 mg via INTRAVENOUS
  Filled 2015-11-17: qty 1

## 2015-11-17 MED ORDER — HYDROMORPHONE HCL 1 MG/ML IJ SOLN: 1.0000 mg | Freq: Once | INTRAMUSCULAR | Status: DC

## 2015-11-17 MED ORDER — DIAZEPAM 5 MG PO TABS: 2.5000 mg | ORAL_TABLET | Freq: Two times a day (BID) | ORAL | Status: DC

## 2015-11-17 NOTE — Discharge Instructions (Signed)
Flank Pain °Flank pain refers to pain that is located on the side of the body between the upper abdomen and the back. The pain may occur over a short period of time (acute) or may be long-term or reoccurring (chronic). It may be mild or severe. Flank pain can be caused by many things. °CAUSES  °Some of the more common causes of flank pain include: °· Muscle strains.   °· Muscle spasms.   °· A disease of your spine (vertebral disk disease).   °· A lung infection (pneumonia).   °· Fluid around your lungs (pulmonary edema).   °· A kidney infection.   °· Kidney stones.   °· A very painful skin rash caused by the chickenpox virus (shingles).   °· Gallbladder disease.   °HOME CARE INSTRUCTIONS  °Home care will depend on the cause of your pain. In general, °· Rest as directed by your caregiver. °· Drink enough fluids to keep your urine clear or pale yellow. °· Only take over-the-counter or prescription medicines as directed by your caregiver. Some medicines may help relieve the pain. °· Tell your caregiver about any changes in your pain. °· Follow up with your caregiver as directed. °SEEK IMMEDIATE MEDICAL CARE IF:  °· Your pain is not controlled with medicine.   °· You have new or worsening symptoms. °· Your pain increases.   °· You have abdominal pain.   °· You have shortness of breath.   °· You have persistent nausea or vomiting.   °· You have swelling in your abdomen.   °· You feel faint or pass out.   °· You have blood in your urine. °· You have a fever or persistent symptoms for more than 2-3 days. °· You have a fever and your symptoms suddenly get worse. °MAKE SURE YOU:  °· Understand these instructions. °· Will watch your condition. °· Will get help right away if you are not doing well or get worse. °  °This information is not intended to replace advice given to you by your health care provider. Make sure you discuss any questions you have with your health care provider. °  °Document Released: 01/09/2006 Document  Revised: 08/12/2012 Document Reviewed: 07/02/2012 °Elsevier Interactive Patient Education ©2016 [employer] ° °

## 2015-11-17 NOTE — Telephone Encounter (Signed)
Patient Name: IVORY Ferrando DOB: [birth year] Initial Comment Caller states his mother has having back pain, meds not helping Nurse Assessment Nurse: Ronnald Ramp, RN, Miranda Date/Time (Eastern Time): 11/17/2015 8:55:35 AM Confirm and document reason for call. If symptomatic, describe symptoms. ---Caller states his mother has been having back pain. She has been seen and diagnosed with arthritis and bone spurs. She has appt on Monday for orthopedics. Has the patient traveled out of the country within the last 30 days? ---Not Applicable Does the patient have any new or worsening symptoms? ---Yes Will a triage be completed? ---Yes Related visit to physician within the last 2 weeks? ---Yes Does the PT have any chronic conditions? (i.e. diabetes, asthma, etc.) ---No Is this a behavioral health or substance abuse call? ---No Guidelines Guideline Title Affirmed Question Affirmed Notes Back Pain [1] SEVERE back pain (e.g., excruciating, unable to do any normal activities) AND [2] not improved 2 hours after pain medicine Final Disposition User See Physician within 4 Hours (or PCP triage) Ronnald Ramp, RN, Miranda Comments No appt available at PCP office. Told caller for pt to be seen in the ED for pain control to help her manage until she sees the specialist. Referrals [facility] - ED Disagree/Comply: Comply

## 2015-11-17 NOTE — ED Provider Notes (Signed)
CSN: [id number]     Arrival date & time 11/17/15  1053 History   First MD Initiated Contact with Patient 11/17/15 1056     Chief Complaint  Patient presents with  . Back Pain     (Consider location/radiation/quality/duration/timing/severity/associated sxs/prior Treatment) Patient is a 74 y.o. female presenting with back pain. The history is provided by the patient.  Back Pain Location:  Lumbar spine Quality:  Shooting Radiates to:  Does not radiate Pain severity:  Severe Pain is:  Same all the time Onset quality:  Gradual Duration:  6 months Timing:  Intermittent Progression:  Waxing and waning Chronicity:  Chronic Relieved by:  Nothing Worsened by:  Movement, palpation, sitting, twisting and touching Ineffective treatments:  Bed rest, narcotics and lying down Associated symptoms: no chest pain, no dysuria, no fever and no headaches   Risk factors: lack of exercise and obesity   Risk factors: no hx of cancer    74 yo F with a chief complaint of right flank pain. This been going on for at least 6 months has been waxing and waning. Patient feels it has not gotten any better over the past couple weeks. Patient was seen here in the emerge department had a negative CT stone study that showed a normal aorta as well as no signs of kidney stones. Patient has follow-up with her family doctor multiple times and had multiple plain films were also read as negative. Patient continues to have severe pain rates it about a 9 out of 10. Worse with palpation movement. Patient denies any recent injury denies history of cancer denies history of blood clots. Patient denies cough congestion fevers. Patient denies any abdominal pain denies radiation of the pain.  Patient denies dysuria increased frequency fevers or chills. Patient denies history of cancer. Patient denies loss of bowel or bladder. Patient denies loss of perirectal sensation. Denies lower extremity weakness.   Past Medical History   Diagnosis Date  . Hypertension   . Anemia   . OSA (obstructive sleep apnea)   . GERD (gastroesophageal reflux disease)   . Arthritis   . Urinary incontinence   . Pneumonia     History of  . Cataract   . Hx of shoulder replacement     lt   Past Surgical History  Procedure Laterality Date  . Abdominal hysterectomy    . Vaginal sling    . Nasal sinus surgery    . Shoulder arthroscopy      Left  . Eye surgery      bilateral cataract extraction  . Upper gastrointestinal endoscopy    . Total shoulder arthroplasty  10/10/2011    Procedure: TOTAL SHOULDER ARTHROPLASTY;  Surgeon: Metta Clines Supple;  Location: [facility];  Service: Orthopedics;  Laterality: Right;   Family History  Problem Relation Age of Onset  . Cancer Father     Prostate cancer  . Cancer Sister     Ovarian cancer  . Diabetes Sister   . Alzheimer's disease Mother   . COPD Neg Hx   . Alcohol abuse Neg Hx   . Early death Neg Hx   . Heart disease Neg Hx   . Hyperlipidemia Neg Hx   . Hypertension Neg Hx   . Kidney disease Neg Hx   . Stroke Neg Hx    Social History  Substance Use Topics  . Smoking status: Never Smoker   . Smokeless tobacco: Never Used  . Alcohol Use: No   OB History  No data available     Review of Systems  Constitutional: Negative for fever and chills.  HENT: Negative for congestion and rhinorrhea.   Eyes: Negative for redness and visual disturbance.  Respiratory: Negative for shortness of breath and wheezing.   Cardiovascular: Negative for chest pain and palpitations.  Gastrointestinal: Negative for nausea and vomiting.  Genitourinary: Negative for dysuria and urgency.  Musculoskeletal: Positive for myalgias, back pain and arthralgias.  Skin: Negative for pallor and wound.  Neurological: Negative for dizziness and headaches.      Allergies  Iohexol; Naproxen; Neomycin-bacitracin zn-polymyx; and Sulfonamide derivatives  Home Medications   Prior to Admission medications    Medication Sig Start Date End Date Taking? Authorizing Provider  Calcium Carbonate-Vitamin D (CALTRATE 600+D) 600-400 MG-UNIT per tablet Take 1 tablet by mouth 2 (two) times daily.    Yes Historical Provider, MD  Ibuprofen (ADVIL PO) Take 200-400 mg by mouth 2 (two) times daily as needed (FOR PAIN). Take 2 to 4 daily prn   Yes Historical Provider, MD  Multiple Vitamins-Minerals (CENTRUM SILVER PO) Take 0.5 tablets by mouth 2 (two) times daily.    Yes Historical Provider, MD  Multiple Vitamins-Minerals (PRESERVISION AREDS 2) CAPS Take 1 tablet by mouth 2 (two) times daily.   Yes Historical Provider, MD  oxyCODONE-acetaminophen (PERCOCET) 7.5-325 MG tablet Take 1 tablet by mouth every 4 (four) hours as needed. 10/31/15  Yes Janith Lima, MD  Polyethyl Glycol-Propyl Glycol (SYSTANE OP) Apply 1 drop to eye as directed. Into both eyes four times a day for dry eyes   Yes Historical Provider, MD  TURMERIC PO Take 1 tablet by mouth daily. Take one by mouth daily   Yes Historical Provider, MD  diazepam (VALIUM) 5 MG tablet Take 0.5 tablets (2.5 mg total) by mouth 2 (two) times daily. 11/17/15   Deno Etienne, DO  EEMT HS 0.625-1.25 MG per tablet TAKE 1 TABLET DAILY 09/22/14   Janith Lima, MD   BP 148/69 mmHg  Pulse 81  Temp(Src) 97.9 F (36.6 C) (Oral)  Resp 18  Ht 5' (1.524 m)  Wt 204 lb (92.534 kg)  BMI 39.84 kg/m2  SpO2 94% Physical Exam  Constitutional: She is oriented to person, place, and time. She appears well-developed and well-nourished. No distress.  HENT:  Head: Normocephalic and atraumatic.  Eyes: EOM are normal. Pupils are equal, round, and reactive to light.  Neck: Normal range of motion. Neck supple.  Cardiovascular: Normal rate and regular rhythm.  Exam reveals no gallop and no friction rub.   No murmur heard. Pulmonary/Chest: Effort normal. She has no wheezes. She has no rales.  Abdominal: Soft. She exhibits no distension. There is no tenderness. There is no rebound and no  guarding.  Musculoskeletal: She exhibits tenderness (TTP lower than R CVA about posterior axillary line of the 12 rib). She exhibits no edema.  No midline TTP.   Neurological: She is alert and oriented to person, place, and time.  Skin: Skin is warm and dry. She is not diaphoretic.  Psychiatric: She has a normal mood and affect. Her behavior is normal.  Nursing note and vitals reviewed.   ED Course  Procedures (including critical care time) Labs Review Labs Reviewed  COMPREHENSIVE METABOLIC PANEL - Abnormal; Notable for the following:    Glucose, Bld 116 (*)    All other components within normal limits  CBC WITH DIFFERENTIAL/PLATELET  LIPASE, BLOOD  URINALYSIS, ROUTINE W REFLEX MICROSCOPIC (NOT AT [facility])    Imaging  Review Ct Abdomen Pelvis Wo Contrast  11/17/2015  CLINICAL DATA:  Right lower back pain 1 month EXAM: CT ABDOMEN AND PELVIS WITHOUT CONTRAST TECHNIQUE: Multidetector CT imaging of the abdomen and pelvis was performed following the standard protocol without IV contrast. COMPARISON:  CT abdomen pelvis 10/27/2015 FINDINGS: .  Lower chest:  Limited imaging of the lung bases is negative. Hepatobiliary: The upper liver was not included on this study. No focal liver lesion. Gallbladder and bile ducts normal. Pancreas: Negative Spleen: Negative Adrenals/Urinary Tract: Mild right hydronephrosis and hydroureter. No obstructing stone. Question recently passed stone. Right renal collecting system is mildly distended compared to the prior CT. No left renal calculi. No left renal obstruction. Negative for renal mass. Urinary bladder normal. Stomach/Bowel: Negative for bowel obstruction. No bowel mass or edema. Vascular/Lymphatic: Negative aorta.  Negative for adenopathy. Reproductive: Postop hysterectomy.  No pelvic mass. Other: No free fluid. Musculoskeletal: Lumbar degenerative changes. See separate CT lumbar spine report from today. IMPRESSION: Mild right hydronephrosis without obstructing  stone. Question recently passed stone Lumbar degenerative changes.  See separate CT lumbar spine report. Electronically Signed   By: Franchot Gallo M.D.   On: 11/17/2015 12:58   Ct L-spine No Charge  11/17/2015  CLINICAL DATA:  Right lower back pain EXAM: CT LUMBAR SPINE WITHOUT CONTRAST TECHNIQUE: Multidetector CT imaging of the lumbar spine was performed without intravenous contrast administration. Multiplanar CT image reconstructions were also generated. COMPARISON:  Lumbar radiographs 10/31/2015.  CT abdomen pelvis today FINDINGS: Mild levoscoliosis. Solid bony fusion L1-2. This is probably degenerative without evidence of surgery. Negative for fracture or mass. T11-12: Negative T12-L1:  Mild disc bulging without spinal stenosis L1-2: Disc degeneration with disc bulging and spurring. Mild facet degeneration. No significant spinal stenosis L2-3: Solid fusion of the vertebral bodies. Mild endplate spurring. Bilateral facet hypertrophy causing mild spinal stenosis. L3-4: Disc degeneration and spurring, right greater than left. Mild right foraminal narrowing due to spurring. Bilateral facet hypertrophy. Mild spinal stenosis. Subarticular stenosis on the right due to spurring. L4-5: Diffuse disc bulging. Moderate facet hypertrophy bilaterally causing moderate spinal stenosis. Mild left foraminal narrowing L5-S1: Disc degeneration with diffuse endplate osteophyte formation. Bilateral facet degeneration. Mild foraminal narrowing bilaterally. IMPRESSION: Negative for fracture.  No acute bony abnormality. Lumbar degenerative changes as described above. Mild spinal stenosis L2-3 and L3-4 with moderate spinal stenosis L4-5. Electronically Signed   By: Franchot Gallo M.D.   On: 11/17/2015 13:07   I have personally reviewed and evaluated these images and lab results as part of my medical decision-making.   EKG Interpretation None      MDM   Final diagnoses:  Right-sided low back pain without sciatica   Hydronephrosis, unspecified hydronephrosis type    74 yo F with a chief complaint of right flank pain. This been off and on for many months but worsening over the past week. Point tender in one location to the right flank. This is below the CVA. No noted abdominal tenderness on exam. Reflexes and strength intact to bilateral lower extremities. Sensation intact. Unable to obtain a contrasted CT scan due to contrast allergy. Will obtain a CT noncontrast of the abdomen and pelvis as well as a reformat of the L-spine. Patient likely needs an outpatient MRI to further evaluate the area.  CT scan with concern for hydronephrosis.  Will have her follow up with urology.  NSAIDs at home, tylenol, oxy as needed. PCP follow up for likely MRI.  Has appointment with ortho on Monday.  1:46 PM:  I have discussed the diagnosis/risks/treatment options with the patient and family and believe the pt to be eligible for discharge home to follow-up with PCP/urology, ortho. We also discussed returning to the ED immediately if new or worsening sx occur. We discussed the sx which are most concerning (e.g., sudden worsening pain, fever, cauda equina) that necessitate immediate return. Medications administered to the patient during their visit and any new prescriptions provided to the patient are listed below.  Medications given during this visit Medications  HYDROmorphone (DILAUDID) injection 0.5 mg (0.5 mg Intravenous Given 11/17/15 1210)  diazepam (VALIUM) tablet 2 mg (2 mg Oral Given 11/17/15 1209)    New Prescriptions   DIAZEPAM (VALIUM) 5 MG TABLET    Take 0.5 tablets (2.5 mg total) by mouth 2 (two) times daily.    The patient appears reasonably screen and/or stabilized for discharge and I doubt any other medical condition or other [facility] requiring further screening, evaluation, or treatment in the ED at this time prior to discharge.    Deno Etienne, DO 11/17/15 1346

## 2015-11-17 NOTE — ED Notes (Signed)
Pt c/o right lower back pain rating it a 9/10 on pain scale that began about a month ago.

## 2015-11-20 DIAGNOSIS — G8929 Other chronic pain: Secondary | ICD-10-CM | POA: Diagnosis not present

## 2015-11-20 DIAGNOSIS — M5136 Other intervertebral disc degeneration, lumbar region: Secondary | ICD-10-CM | POA: Diagnosis not present

## 2015-11-20 DIAGNOSIS — M545 Low back pain: Secondary | ICD-10-CM | POA: Diagnosis not present

## 2015-11-21 ENCOUNTER — Ambulatory Visit
Admission: RE | Admit: 2015-11-21 | Discharge: 2015-11-21 | Disposition: A | Discharge status: Home / Self Care | Payer: Medicare Other | Source: Ambulatory Visit | Attending: Family | Admitting: Family

## 2015-11-21 ENCOUNTER — Encounter: Payer: Self-pay | Admitting: Family

## 2015-11-21 DIAGNOSIS — R221 Localized swelling, mass and lump, neck: Secondary | ICD-10-CM | POA: Diagnosis not present

## 2015-11-21 DIAGNOSIS — M542 Cervicalgia: Secondary | ICD-10-CM | POA: Diagnosis not present

## 2015-11-21 MED ORDER — GADOBENATE DIMEGLUMINE 529 MG/ML IV SOLN
20.0000 mL | Freq: Once | INTRAVENOUS | Status: AC | PRN
  Administered 2015-11-21: 18 mL via INTRAVENOUS

## 2015-11-23 ENCOUNTER — Other Ambulatory Visit: Payer: Self-pay | Admitting: Otolaryngology

## 2015-11-23 ENCOUNTER — Other Ambulatory Visit (HOSPITAL_COMMUNITY)
Admission: RE | Admit: 2015-11-23 | Discharge: 2015-11-23 | Disposition: A | Discharge status: Home / Self Care | Payer: Medicare Other | Source: Ambulatory Visit | Attending: Otolaryngology | Admitting: Otolaryngology

## 2015-11-23 DIAGNOSIS — R221 Localized swelling, mass and lump, neck: Secondary | ICD-10-CM | POA: Insufficient documentation

## 2015-11-23 DIAGNOSIS — R220 Localized swelling, mass and lump, head: Secondary | ICD-10-CM | POA: Diagnosis not present

## 2015-11-24 ENCOUNTER — Other Ambulatory Visit: Payer: Medicare Other

## 2015-11-30 ENCOUNTER — Encounter: Payer: Self-pay | Admitting: *Deleted

## 2015-11-30 DIAGNOSIS — M5136 Other intervertebral disc degeneration, lumbar region: Secondary | ICD-10-CM | POA: Diagnosis not present

## 2015-11-30 DIAGNOSIS — N1330 Unspecified hydronephrosis: Secondary | ICD-10-CM | POA: Diagnosis not present

## 2015-11-30 DIAGNOSIS — G8929 Other chronic pain: Secondary | ICD-10-CM | POA: Diagnosis not present

## 2015-11-30 DIAGNOSIS — N3946 Mixed incontinence: Secondary | ICD-10-CM | POA: Diagnosis not present

## 2015-11-30 DIAGNOSIS — M545 Low back pain: Secondary | ICD-10-CM | POA: Diagnosis not present

## 2015-12-08 ENCOUNTER — Ambulatory Visit: Payer: Medicare Other | Attending: Physical Medicine and Rehabilitation | Admitting: Physical Therapy

## 2015-12-08 DIAGNOSIS — M545 Low back pain, unspecified: Secondary | ICD-10-CM

## 2015-12-08 DIAGNOSIS — R5381 Other malaise: Secondary | ICD-10-CM | POA: Insufficient documentation

## 2015-12-08 NOTE — Therapy (Signed)
[facility] [place], [place], [address] Phone: [phone]   Fax:  [phone]  Physical Therapy Evaluation  Patient Details  Name: Veronica Garza MRN: [id number] Date of Birth: [birth year] Referring Provider: Suella Broad MD.  Encounter Date: 12/08/2015      PT End of Session - 12/08/15 1424    Visit Number 1   Number of Visits 12   Date for PT Re-Evaluation 01/26/16   PT Start Time [id number]   PT Stop Time 1123   PT Time Calculation (min) 46 min   Activity Tolerance Patient tolerated treatment well   Behavior During Therapy [facility] for tasks assessed/performed      Past Medical History  Diagnosis Date  . Hypertension   . Anemia   . OSA (obstructive sleep apnea)   . GERD (gastroesophageal reflux disease)   . Arthritis   . Urinary incontinence   . Pneumonia     History of  . Cataract   . Hx of shoulder replacement     lt    Past Surgical History  Procedure Laterality Date  . Abdominal hysterectomy    . Vaginal sling    . Nasal sinus surgery    . Shoulder arthroscopy      Left  . Eye surgery      bilateral cataract extraction  . Upper gastrointestinal endoscopy    . Total shoulder arthroplasty  10/10/2011    Procedure: TOTAL SHOULDER ARTHROPLASTY;  Surgeon: Metta Clines Supple;  Location: [facility];  Service: Orthopedics;  Laterality: Right;    There were no vitals filed for this visit.  Visit Diagnosis:  Right-sided low back pain without sciatica - Plan: PT plan of care cert/re-cert  Debility - Plan: PT plan of care cert/re-cert      Subjective Assessment - 12/08/15 1429    Subjective My pain is a round a 5/10 today.   Limitations Sitting;Standing;Writing   How long can you sit comfortably? 15 minutes.   How long can you stand comfortably? 10 minutes.   How long can you walk comfortably? 1-2 blocks.   Diagnostic tests CT scan of lumbar scan.   Pain Score 5    Pain Location Back   Pain Orientation  Right;Lower   Pain Descriptors / Indicators Aching;Stabbing   Pain Type Acute pain   Pain Frequency Constant            [facility] PT Assessment - 12/08/15 0001    Assessment   Medical Diagnosis Chronic right sided low back pain without sciatica   Referring Provider Suella Broad MD.   Onset Date/Surgical Date --  2 months.   Precautions   Precaution Comments Patient reports "OP".   Restrictions   Weight Bearing Restrictions No   Balance Screen   Has the patient fallen in the past 6 months No   Has the patient had a decrease in activity level because of a fear of falling?  No   Is the patient reluctant to leave their home because of a fear of falling?  No   Home Ecologist residence   Prior Function   Level of Independence Independent   Vocation Retired   Observation/Other Assessments   Focus on Therapeutic Outcomes (FOTO)  60% limitation.   Sensation   Additional Comments LE sensation intact.   Posture/Postural Control   Posture/Postural Control Postural limitations   Postural Limitations Rounded Shoulders;Forward head   Posture Comments Mild scoliosis.   ROM /  Strength   AROM / PROM / Strength AROM;Strength   AROM   Overall AROM Comments Normal active lumbar active ROM into flexion and extension.   Strength   Overall Strength Comments Normal bilateral LE strength.   Palpation   Palpation comment Very tender to palpation over right lumbar erector spinae musculature which are in spasm.   Special Tests    Special Tests Lumbar   Lumbar Tests --  (-) SLR testing.  LE DTR's= 1+/4+.   Bed Mobility   Bed Mobility Supine to Sit   Supine to Sit 3: Mod assist   Transfers   Transfers Sit to Stand   Sit to Stand 5: Supervision   Ambulation/Gait   Gait Comments Patient walking in some spinal flexion and is clearly in pain.                   [facility] Adult PT Treatment/Exercise - 12/08/15 0001    Modalities   Modalities Ultrasound    Ultrasound   Ultrasound Location Left sdly position with pillow between knees while patient received Combo E'stim/U/S at 1.50 W/CM2 x 10 minutes to patient affected right low back musculature.                  PT Short Term Goals - 12/08/15 1438    PT SHORT TERM GOAL #1   Title Ind with a HEP.   Time 2   Period Weeks   Status New           PT Long Term Goals - 12/08/15 1438    PT LONG TERM GOAL #1   Title Sit 30 minutes with pain not > 2-3/10.   Time 4   Period Weeks   Status New   PT LONG TERM GOAL #2   Title Stand 20 minutes with pain not > 3/10.   Time 4   Period Weeks   Status New   PT LONG TERM GOAL #3   Title Walk a community distance wiht pain not > 3/10.   Time 4   Period Weeks   Status New   PT LONG TERM GOAL #4   Title Perform ADL's with pain not > 3/10.   Time 4   Period Weeks   Status New               Plan - 12/08/15 1432    Clinical Impression Statement The patient reports she has had a h/o low back pain with some left LE symptoms but around Thanksgiving her pain began so intense that she required a hospitalization.  Her pain was a 10+/10 and felt like a knife in her low back.  Medication helped but her pain became so sever again she required another hospital admission.  The patient became teary eyed recalling her history as her pain was unbearable.  Today is pain is localized to her right low back and is rated at 5/10.  She is very fearful of her pain returning to severe levels.  She is currently limited in her ability to walk, sit and stand for proloned periods of time due to her pain rising.  Lting down with her feet elevated decreases her pain.     Pt will benefit from skilled therapeutic intervention in order to improve on the following deficits Pain;Decreased activity tolerance   Rehab Potential Excellent   PT Frequency 3x / week   PT Duration 4 weeks   PT Treatment/Interventions ADLs/Self Care Home Management;Electrical  Stimulation;Moist Heat;Therapeutic exercise;Therapeutic  activities;Ultrasound;Patient/family education;Manual techniques   PT Next Visit Plan Combo E'stim/US to right low back musculature; STW/M; Moist heat and electrical stimulation in supine with legs elevated.  Instruct in HEP;  Draw-ins, hip bridges.          G-Codes - 12-09-15 1440    Functional Assessment Tool Used FOTO....60% limitation.   Functional Limitation Mobility: Walking and moving around   Mobility: Walking and Moving Around Current Status [phone]) At least 40 percent but less than 60 percent impaired, limited or restricted   Mobility: Walking and Moving Around Goal Status [phone]) At least 20 percent but less than 40 percent impaired, limited or restricted       Problem List Patient Active Problem List   Diagnosis Date Noted  . DDD (degenerative disc disease), lumbar 11/06/2015  . Cervical lymphadenopathy 10/18/2015  . Cervical adenopathy 10/17/2015  . Right-sided low back pain without sciatica 06/21/2015  . Diastolic dysfunction without heart failure 11/13/2014  . Other abnormal glucose 05/27/2013  . Osteopenia of the elderly 05/27/2013  . Other and unspecified hyperlipidemia 09/04/2012  . Esophageal stricture 09/12/2011  . Other screening mammogram 08/08/2011  . Routine general medical examination at a health care facility 08/08/2011  . Essential hypertension, benign 01/14/2011  . URINARY INCONTINENCE 12/14/2008  . OA (osteoarthritis) 07/21/2008  . Obstructive sleep apnea 10/14/2007  . GERD 10/14/2007    Krystyn Picking, [place] MPT 09-Dec-2015, 2:52 PM  [facility] [address] [place], [place], [address] Phone: [phone]   Fax:  [phone]  Name: SHARN PARLIN MRN: [id number] Date of Birth: 1941-04-03

## 2015-12-12 ENCOUNTER — Ambulatory Visit: Payer: Medicare Other | Admitting: *Deleted

## 2015-12-12 DIAGNOSIS — M545 Low back pain, unspecified: Secondary | ICD-10-CM

## 2015-12-12 DIAGNOSIS — R5381 Other malaise: Secondary | ICD-10-CM | POA: Diagnosis not present

## 2015-12-12 NOTE — Patient Instructions (Signed)
Isometric Abdominal   Lying on back with knees bent, tighten stomach by pulling navel down. Hold _5___ seconds. Repeat __5__ times per set. Do __5__ sets per session. Do _3-5___ sessions per day.  http://orth.exer.us/1086   Copyright  VHI. All rights reserved.  Bent Leg Lift (Hook-Lying)   Tighten stomach and slowly raise right leg _6___ inches from floor. Keep trunk rigid. Hold _2-3___ seconds. Repeat _10___ times per set. Do _3___ sets per session. Do _2-3___ sessions per day.  http://orth.exer.us/1090   Copyright  VHI. All rights reserved.  Bridging   Slowly raise buttocks from floor, keeping stomach tight. Repeat __10__ times per set. Do _3___ sets per session. Do __2-3__ sessions per day.  http://orth.exer.us/1096   Copyright  VHI. All rights reserved.

## 2015-12-12 NOTE — Therapy (Addendum)
[facility] [place], [place], [address] Phone: [phone]   Fax:  [phone]  Physical Therapy Treatment  Patient Details  Name: Veronica Garza MRN: [id number] Date of Birth: 1941/11/04 Referring Provider: Suella Broad MD.  Encounter Date: 12/12/2015      PT End of Session - 12/12/15 1532    Visit Number 2   Number of Visits 12   Date for PT Re-Evaluation 01/26/16   PT Start Time 1430   PT Stop Time 1529   PT Time Calculation (min) 59 min      Past Medical History  Diagnosis Date  . Hypertension   . Anemia   . OSA (obstructive sleep apnea)   . GERD (gastroesophageal reflux disease)   . Arthritis   . Urinary incontinence   . Pneumonia     History of  . Cataract   . Hx of shoulder replacement     lt    Past Surgical History  Procedure Laterality Date  . Abdominal hysterectomy    . Vaginal sling    . Nasal sinus surgery    . Shoulder arthroscopy      Left  . Eye surgery      bilateral cataract extraction  . Upper gastrointestinal endoscopy    . Total shoulder arthroplasty  10/10/2011    Procedure: TOTAL SHOULDER ARTHROPLASTY;  Surgeon: Metta Clines Supple;  Location: [place];  Service: Orthopedics;  Laterality: Right;    There were no vitals filed for this visit.  Visit Diagnosis:  Right-sided low back pain without sciatica  Debility      Subjective Assessment - 12/12/15 1540    Subjective My pain is a round a 5/10 today.again   Limitations Sitting;Standing;Writing   How long can you sit comfortably? 15 minutes.   How long can you stand comfortably? 10 minutes.   How long can you walk comfortably? 1-2 blocks.   Diagnostic tests CT scan of lumbar scan.   Currently in Pain? Yes   Pain Score 5    Pain Location Back   Pain Orientation Right;Lower   Pain Descriptors / Indicators Aching;Stabbing   Pain Type Acute pain   Pain Frequency Constant   Aggravating Factors  sitting, standing, walking    Pain Relieving Factors heat, estim, PT Rxs                         [facility] Adult PT Treatment/Exercise - 12/12/15 0001    Exercises   Exercises Lumbar   Lumbar Exercises: Supine   Ab Set 20 reps;5 seconds  Drawin also discussed performing in sitting and standing   Bent Knee Raise 20 reps;3 seconds  Performed with Drawin and verbal cues to breath normally   Modalities   Modalities Electrical Stimulation;Ultrasound;Moist Heat   Moist Heat Therapy   Number Minutes Moist Heat 15 Minutes   Moist Heat Location Lumbar Spine   Electrical Stimulation   Electrical Stimulation Location RT LB paras PRemod x 15 mins 80-150 hz with pt hooklying   Electrical Stimulation Goals Pain   Ultrasound   Ultrasound Location LT sidelying RT LB paras 1.5 w/cm2 x 12 mins with pillow B/W knees   Ultrasound Goals Pain      STW to RT LB paras and QL            PT Education - 12/12/15 1510    Education provided Yes   Education Details core activation exs   Person(s)  Educated Patient   Methods Explanation;Demonstration;Tactile cues;Verbal cues;Handout   Comprehension Verbalized understanding;Returned demonstration          PT Short Term Goals - 12/08/15 1438    PT SHORT TERM GOAL #1   Title Ind with a HEP.   Time 2   Period Weeks   Status New           PT Long Term Goals - 12/08/15 1438    PT LONG TERM GOAL #1   Title Sit 30 minutes with pain not > 2-3/10.   Time 4   Period Weeks   Status New   PT LONG TERM GOAL #2   Title Stand 20 minutes with pain not > 3/10.   Time 4   Period Weeks   Status New   PT LONG TERM GOAL #3   Title Walk a community distance wiht pain not > 3/10.   Time 4   Period Weeks   Status New   PT LONG TERM GOAL #4   Title Perform ADL's with pain not > 3/10.   Time 4   Period Weeks   Status New               Plan - 12/12/15 1534    Clinical Impression Statement Pt did fairly well today with Rx. She was able to perform core  activation exs with verbal and tactile cues for technique. During STW there was notable tightness in RT LB paras and she was very tight and sore in RT QL and very light pressure was used over QL. HEP was given for core activation exs. Goals on-going   Pt will benefit from skilled therapeutic intervention in order to improve on the following deficits Pain;Decreased activity tolerance   Rehab Potential Excellent   PT Frequency 3x / week   PT Duration 4 weeks   PT Treatment/Interventions ADLs/Self Care Home Management;Electrical Stimulation;Moist Heat;Therapeutic exercise;Therapeutic activities;Ultrasound;Patient/family education;Manual techniques   PT Next Visit Plan Combo E'stim/US to right low back musculature; STW/M; Moist heat and electrical stimulation in supine with legs elevated.  Instruct in HEP;  Draw-ins, hip bridges.   Consulted and Agree with Plan of Care Patient        Problem List Patient Active Problem List   Diagnosis Date Noted  . DDD (degenerative disc disease), lumbar 11/06/2015  . Cervical lymphadenopathy 10/18/2015  . Cervical adenopathy 10/17/2015  . Right-sided low back pain without sciatica 06/21/2015  . Diastolic dysfunction without heart failure 11/13/2014  . Other abnormal glucose 05/27/2013  . Osteopenia of the elderly 05/27/2013  . Other and unspecified hyperlipidemia 09/04/2012  . Esophageal stricture 09/12/2011  . Other screening mammogram 08/08/2011  . Routine general medical examination at a health care facility 08/08/2011  . Essential hypertension, benign 01/14/2011  . URINARY INCONTINENCE 12/14/2008  . OA (osteoarthritis) 07/21/2008  . Obstructive sleep apnea 10/14/2007  . GERD 10/14/2007    RAMSEUR,CHRIS, PTA 12/12/2015, 3:43 PM  [facility] [address] [place], [place], [address] Phone: [phone]   Fax:  [phone]  Name: KANDANCE CHAVARIA MRN: [id number] Date of Birth: 1941-09-04

## 2015-12-13 DIAGNOSIS — Z961 Presence of intraocular lens: Secondary | ICD-10-CM | POA: Diagnosis not present

## 2015-12-13 DIAGNOSIS — H04123 Dry eye syndrome of bilateral lacrimal glands: Secondary | ICD-10-CM | POA: Diagnosis not present

## 2015-12-13 DIAGNOSIS — H353131 Nonexudative age-related macular degeneration, bilateral, early dry stage: Secondary | ICD-10-CM | POA: Diagnosis not present

## 2015-12-14 ENCOUNTER — Ambulatory Visit: Payer: Medicare Other | Admitting: Physical Therapy

## 2015-12-14 DIAGNOSIS — M545 Low back pain, unspecified: Secondary | ICD-10-CM

## 2015-12-14 DIAGNOSIS — R5381 Other malaise: Secondary | ICD-10-CM

## 2015-12-14 NOTE — Therapy (Signed)
[facility] [place], [place], [address] Phone: [phone]   Fax:  [phone]  Physical Therapy Treatment  Patient Details  Name: Veronica Garza MRN: [id number] Date of Birth: June 06, 1941 Referring Provider: Suella Broad MD.  Encounter Date: 12/14/2015      PT End of Session - 12/14/15 1033    Visit Number 3   Number of Visits 12   Date for PT Re-Evaluation 01/26/16   PT Start Time [id number]   PT Stop Time 1131   PT Time Calculation (min) 59 min   Activity Tolerance Patient limited by pain   Behavior During Therapy [facility] for tasks assessed/performed      Past Medical History  Diagnosis Date  . Hypertension   . Anemia   . OSA (obstructive sleep apnea)   . GERD (gastroesophageal reflux disease)   . Arthritis   . Urinary incontinence   . Pneumonia     History of  . Cataract   . Hx of shoulder replacement     lt    Past Surgical History  Procedure Laterality Date  . Abdominal hysterectomy    . Vaginal sling    . Nasal sinus surgery    . Shoulder arthroscopy      Left  . Eye surgery      bilateral cataract extraction  . Upper gastrointestinal endoscopy    . Total shoulder arthroplasty  10/10/2011    Procedure: TOTAL SHOULDER ARTHROPLASTY;  Surgeon: Metta Clines Supple;  Location: [place];  Service: Orthopedics;  Laterality: Right;    There were no vitals filed for this visit.  Visit Diagnosis:  Right-sided low back pain without sciatica  Debility      Subjective Assessment - 12/14/15 1033    Subjective Patient states she walked all over [facility] yesterday visiting someone, so she  is really hurting today.   Currently in Pain? Yes   Pain Score 6    Pain Orientation Right;Lower   Pain Descriptors / Indicators Aching   Pain Type Acute pain   Pain Frequency Constant            [facility] PT Assessment - 12/14/15 0001    Assessment   Medical Diagnosis Chronic right sided low back pain  without sciatica                     [facility] Adult PT Treatment/Exercise - 12/14/15 0001    Lumbar Exercises: Stretches   Single Knee to Chest Stretch Limitations attempted but no stretch noted   Piriformis Stretch 2 reps;20 seconds   Piriformis Stretch Limitations Lt sidelying with hip at 90 deg flexion dropping toward table using pillow for support  supine cross body stretch too difficult   Modalities   Modalities Electrical Stimulation;Moist Heat;Ultrasound   Moist Heat Therapy   Number Minutes Moist Heat 15 Minutes   Moist Heat Location Lumbar Spine   Electrical Stimulation   Electrical Stimulation Location Rt LB and gluteals premod 80-150 hz x 15 min  to tolerance   Electrical Stimulation Goals Pain   Ultrasound   Ultrasound Location Lt sidelying to R lumbar paraspinals/QL and gluts   Ultrasound Parameters 15wcm2 1 mhz cont x 12 min   Ultrasound Goals Pain   Manual Therapy   Manual Therapy Soft tissue mobilization   Manual therapy comments marked tenderness to light palpation at L4/5   Soft tissue mobilization R lumbar paraspinals and gluteals/piriformis  PT Education - 12/14/15 1214    Education provided Yes   Education Details SDLY hip ER stretch; discussed positioning in sidelying for QL stretch but patient did not feel she could tolerate trying this today.   Person(s) Educated Patient   Methods Explanation;Demonstration;Handout;Tactile cues;Verbal cues   Comprehension Verbalized understanding;Returned demonstration          PT Short Term Goals - 12/14/15 1224    PT SHORT TERM GOAL #1   Title Ind with a HEP.   Time 2   Period Weeks   Status On-going           PT Long Term Goals - 12/08/15 1438    PT LONG TERM GOAL #1   Title Sit 30 minutes with pain not > 2-3/10.   Time 4   Period Weeks   Status New   PT LONG TERM GOAL #2   Title Stand 20 minutes with pain not > 3/10.   Time 4   Period Weeks   Status New   PT LONG  TERM GOAL #3   Title Walk a community distance wiht pain not > 3/10.   Time 4   Period Weeks   Status New   PT LONG TERM GOAL #4   Title Perform ADL's with pain not > 3/10.   Time 4   Period Weeks   Status New               Plan - 12/14/15 1225    Clinical Impression Statement Patient was very painful today due to increased activity previous day. She has active TPs and marked tenderness of R gluteals and hip external rotators. Able to tolerate stretch with support of pillow. Responded fairly well to treatment reporting decreased pain to 5/10.   PT Next Visit Plan Continue modalities for pain control, STW/manual to low back and gluteals/hip; progress core as tolerated.   Consulted and Agree with Plan of Care Patient        Problem List Patient Active Problem List   Diagnosis Date Noted  . DDD (degenerative disc disease), lumbar 11/06/2015  . Cervical lymphadenopathy 10/18/2015  . Cervical adenopathy 10/17/2015  . Right-sided low back pain without sciatica 06/21/2015  . Diastolic dysfunction without heart failure 11/13/2014  . Other abnormal glucose 05/27/2013  . Osteopenia of the elderly 05/27/2013  . Other and unspecified hyperlipidemia 09/04/2012  . Esophageal stricture 09/12/2011  . Other screening mammogram 08/08/2011  . Routine general medical examination at a health care facility 08/08/2011  . Essential hypertension, benign 01/14/2011  . URINARY INCONTINENCE 12/14/2008  . OA (osteoarthritis) 07/21/2008  . Obstructive sleep apnea 10/14/2007  . GERD 10/14/2007   Madelyn Flavors PT  12/14/2015, 12:32 PM  [facility] [address] [place], [place], [address] Phone: [phone]   Fax:  [phone]  Name: Veronica Garza MRN: [id number] Date of Birth: 09-26-1941

## 2015-12-19 ENCOUNTER — Ambulatory Visit: Payer: Medicare Other | Admitting: Physical Therapy

## 2015-12-19 DIAGNOSIS — M545 Low back pain, unspecified: Secondary | ICD-10-CM

## 2015-12-19 DIAGNOSIS — R5381 Other malaise: Secondary | ICD-10-CM | POA: Diagnosis not present

## 2015-12-19 NOTE — Therapy (Signed)
[facility] [place], [place], [address] Phone: [phone]   Fax:  [phone]  Physical Therapy Treatment  Patient Details  Name: Veronica Garza MRN: [id number] Date of Birth: 09-25-1941 Referring Provider: Suella Broad MD.  Encounter Date: 12/19/2015      PT End of Session - 12/19/15 1350    Visit Number 4   Number of Visits 12   Date for PT Re-Evaluation 01/26/16   PT Start Time 1351   PT Stop Time 1446   PT Time Calculation (min) 55 min   Activity Tolerance Patient tolerated treatment well   Behavior During Therapy [facility] for tasks assessed/performed      Past Medical History  Diagnosis Date  . Hypertension   . Anemia   . OSA (obstructive sleep apnea)   . GERD (gastroesophageal reflux disease)   . Arthritis   . Urinary incontinence   . Pneumonia     History of  . Cataract   . Hx of shoulder replacement     lt    Past Surgical History  Procedure Laterality Date  . Abdominal hysterectomy    . Vaginal sling    . Nasal sinus surgery    . Shoulder arthroscopy      Left  . Eye surgery      bilateral cataract extraction  . Upper gastrointestinal endoscopy    . Total shoulder arthroplasty  10/10/2011    Procedure: TOTAL SHOULDER ARTHROPLASTY;  Surgeon: Metta Clines Supple;  Location: [place];  Service: Orthopedics;  Laterality: Right;    There were no vitals filed for this visit.  Visit Diagnosis:  Right-sided low back pain without sciatica  Debility      Subjective Assessment - 12/19/15 1349    Subjective Not as bad today.   Limitations Sitting;Standing;Writing   How long can you sit comfortably? 15 minutes.   How long can you stand comfortably? 10 minutes.   How long can you walk comfortably? 1-2 blocks.   Diagnostic tests CT scan of lumbar scan.   Currently in Pain? Yes   Pain Score 4    Pain Location Back   Pain Orientation Lower;Right   Pain Descriptors / Indicators Pressure   Pain Type Acute  pain   Pain Frequency Intermittent            [facility] PT Assessment - 12/19/15 0001    Assessment   Medical Diagnosis Chronic right sided low back pain without sciatica                     [facility] Adult PT Treatment/Exercise - 12/19/15 0001    Lumbar Exercises: Stretches   Single Knee to Chest Stretch 3 reps;30 seconds;Other (comment)  RLE   Piriformis Stretch 3 reps;30 seconds;Other (comment)  RLE figure 4 stretch   Piriformis Stretch Limitations L SL hip at 90 deg dropping to table 3x30 sec   Lumbar Exercises: Supine   Bridge Other (comment)  Attempted but patient reported increased pain   Modalities   Modalities Electrical Stimulation;Moist Heat;Ultrasound   Moist Heat Therapy   Number Minutes Moist Heat 15 Minutes   Moist Heat Location Lumbar Spine   Electrical Stimulation   Electrical Stimulation Location R Lumbar paraspinals/ QL   Electrical Stimulation Action Pre-Mod   Electrical Stimulation Parameters 80-150 Hz x15 min   Electrical Stimulation Goals Pain;Tone   Ultrasound   Ultrasound Location R lumbar paraspinals/ QL   Ultrasound Parameters 1.5 w/cm2, 100%,1 mhz x10  min   Ultrasound Goals Pain   Manual Therapy   Manual Therapy Myofascial release   Myofascial Release MFR/TPR to R lumbar paraspinals/QL to decrease tightness and pain in L sidelying                  PT Short Term Goals - 12/14/15 1224    PT SHORT TERM GOAL #1   Title Ind with a HEP.   Time 2   Period Weeks   Status On-going           PT Long Term Goals - 12/08/15 1438    PT LONG TERM GOAL #1   Title Sit 30 minutes with pain not > 2-3/10.   Time 4   Period Weeks   Status New   PT LONG TERM GOAL #2   Title Stand 20 minutes with pain not > 3/10.   Time 4   Period Weeks   Status New   PT LONG TERM GOAL #3   Title Walk a community distance wiht pain not > 3/10.   Time 4   Period Weeks   Status New   PT LONG TERM GOAL #4   Title Perform ADL's with pain not >  3/10.   Time 4   Period Weeks   Status New               Plan - 12/19/15 1442    Clinical Impression Statement Patient tolerated today fairly well with only tenderness noted during manual therapy and pain with attempted bridge exercise. Patient noted during the directed low back/ hip stretches today that she could feel the stretch. Attempted basic bridge exercise today and with hip raise patient experienced pain and exercise was discontinued. Notable tightness noted in lumbar paraspinals and QL today and patient noted tenderness when certain spots of the QL were noted. Normal modalites response noted following removal of the modalities. Experienced 3/10 low back pain upon end of treatment today.   Pt will benefit from skilled therapeutic intervention in order to improve on the following deficits Pain;Decreased activity tolerance   Rehab Potential Excellent   PT Frequency 3x / week   PT Duration 4 weeks   PT Treatment/Interventions ADLs/Self Care Home Management;Electrical Stimulation;Moist Heat;Therapeutic exercise;Therapeutic activities;Ultrasound;Patient/family education;Manual techniques   PT Next Visit Plan Continue modalities for pain control, STW/manual to low back and gluteals/hip; progress core as tolerated.   Consulted and Agree with Plan of Care Patient        Problem List Patient Active Problem List   Diagnosis Date Noted  . DDD (degenerative disc disease), lumbar 11/06/2015  . Cervical lymphadenopathy 10/18/2015  . Cervical adenopathy 10/17/2015  . Right-sided low back pain without sciatica 06/21/2015  . Diastolic dysfunction without heart failure 11/13/2014  . Other abnormal glucose 05/27/2013  . Osteopenia of the elderly 05/27/2013  . Other and unspecified hyperlipidemia 09/04/2012  . Esophageal stricture 09/12/2011  . Other screening mammogram 08/08/2011  . Routine general medical examination at a health care facility 08/08/2011  . Essential hypertension,  benign 01/14/2011  . URINARY INCONTINENCE 12/14/2008  . OA (osteoarthritis) 07/21/2008  . Obstructive sleep apnea 10/14/2007  . GERD 10/14/2007    Wynelle Fanny, PTA 12/19/2015, 2:52 PM  [facility] [address] [place], [place], [address] Phone: [phone]   Fax:  [phone]  Name: Veronica Garza MRN: [id number] Date of Birth: May 17, 1941

## 2015-12-21 ENCOUNTER — Ambulatory Visit: Payer: Medicare Other | Admitting: *Deleted

## 2015-12-21 DIAGNOSIS — R5381 Other malaise: Secondary | ICD-10-CM | POA: Diagnosis not present

## 2015-12-21 DIAGNOSIS — M545 Low back pain, unspecified: Secondary | ICD-10-CM

## 2015-12-21 NOTE — Therapy (Signed)
[facility] [place], [place], [address] Phone: [phone]   Fax:  [phone]  Physical Therapy Treatment  Patient Details  Name: Veronica Garza MRN: [id number] Date of Birth: May 19, 1941 Referring Provider: Suella Broad MD.  Encounter Date: 12/21/2015      PT End of Session - 12/21/15 1201    Visit Number 5   Number of Visits 12   Date for PT Re-Evaluation 01/26/16   PT Start Time 1115   PT Stop Time 1215   PT Time Calculation (min) 60 min      Past Medical History  Diagnosis Date  . Hypertension   . Anemia   . OSA (obstructive sleep apnea)   . GERD (gastroesophageal reflux disease)   . Arthritis   . Urinary incontinence   . Pneumonia     History of  . Cataract   . Hx of shoulder replacement     lt    Past Surgical History  Procedure Laterality Date  . Abdominal hysterectomy    . Vaginal sling    . Nasal sinus surgery    . Shoulder arthroscopy      Left  . Eye surgery      bilateral cataract extraction  . Upper gastrointestinal endoscopy    . Total shoulder arthroplasty  10/10/2011    Procedure: TOTAL SHOULDER ARTHROPLASTY;  Surgeon: Metta Clines Supple;  Location: [place];  Service: Orthopedics;  Laterality: Right;    There were no vitals filed for this visit.  Visit Diagnosis:  Right-sided low back pain without sciatica  Debility      Subjective Assessment - 12/21/15 1119    Subjective pain is less today 4/10. I'm doing better with sitting and driving   Limitations Sitting;Standing;Writing   How long can you sit comfortably? 15 minutes.   How long can you stand comfortably? 10 minutes.   How long can you walk comfortably? 1-2 blocks.   Diagnostic tests CT scan of lumbar scan.   Currently in Pain? Yes   Pain Score 4    Pain Location Back   Pain Orientation Right;Lower   Pain Descriptors / Indicators Pressure                         [facility] Adult PT Treatment/Exercise -  12/21/15 0001    Exercises   Exercises Lumbar   Lumbar Exercises: Stretches   Single Knee to Chest Stretch 3 reps;30 seconds;Other (comment)  RLE   Piriformis Stretch 3 reps;30 seconds;Other (comment)  RLE figure 4 stretch   Lumbar Exercises: Supine   Ab Set 20 reps;5 seconds   Bent Knee Raise 20 reps;3 seconds  Performed with Drawin and verbal cues to breath normally   Modalities   Modalities Electrical Stimulation;Moist Heat;Ultrasound   Moist Heat Therapy   Number Minutes Moist Heat 15 Minutes   Moist Heat Location Lumbar Spine   Electrical Stimulation   Electrical Stimulation Location Rt LB and gluteals premod 80-150 hz x 15 min  to tolerance   Electrical Stimulation Goals Pain;Tone   Ultrasound   Ultrasound Location RT lumbar    Ultrasound Parameters 1.5 w/cm2 x 10 mins   Ultrasound Goals Pain   Manual Therapy   Manual Therapy Myofascial release   Myofascial Release MFR/TPR to R lumbar paraspinals/QL/ and RT glute to decrease tightness and pain in L sidelying  PT Short Term Goals - 12/14/15 1224    PT SHORT TERM GOAL #1   Title Ind with a HEP.   Time 2   Period Weeks   Status On-going           PT Long Term Goals - 12/08/15 1438    PT LONG TERM GOAL #1   Title Sit 30 minutes with pain not > 2-3/10.   Time 4   Period Weeks   Status New   PT LONG TERM GOAL #2   Title Stand 20 minutes with pain not > 3/10.   Time 4   Period Weeks   Status New   PT LONG TERM GOAL #3   Title Walk a community distance wiht pain not > 3/10.   Time 4   Period Weeks   Status New   PT LONG TERM GOAL #4   Title Perform ADL's with pain not > 3/10.   Time 4   Period Weeks   Status New               Plan - 12/21/15 1201    Clinical Impression Statement Pt did faily well today with Rx. She was able to perform all exs with minimal pain increase except bridges. She is still tender with palpaion to QL, and RT glute. She states she is doing better  with less pain with sitting and driving.    Pt will benefit from skilled therapeutic intervention in order to improve on the following deficits Pain;Decreased activity tolerance   Rehab Potential Excellent   PT Frequency 3x / week   PT Duration 4 weeks   PT Treatment/Interventions ADLs/Self Care Home Management;Electrical Stimulation;Moist Heat;Therapeutic exercise;Therapeutic activities;Ultrasound;Patient/family education;Manual techniques   PT Next Visit Plan Continue modalities for pain control, STW/manual to low back and gluteals/hip; progress core as tolerated.   Consulted and Agree with Plan of Care Patient        Problem List Patient Active Problem List   Diagnosis Date Noted  . DDD (degenerative disc disease), lumbar 11/06/2015  . Cervical lymphadenopathy 10/18/2015  . Cervical adenopathy 10/17/2015  . Right-sided low back pain without sciatica 06/21/2015  . Diastolic dysfunction without heart failure 11/13/2014  . Other abnormal glucose 05/27/2013  . Osteopenia of the elderly 05/27/2013  . Other and unspecified hyperlipidemia 09/04/2012  . Esophageal stricture 09/12/2011  . Other screening mammogram 08/08/2011  . Routine general medical examination at a health care facility 08/08/2011  . Essential hypertension, benign 01/14/2011  . URINARY INCONTINENCE 12/14/2008  . OA (osteoarthritis) 07/21/2008  . Obstructive sleep apnea 10/14/2007  . GERD 10/14/2007    Veronica Garza,CHRIS, PTA 12/21/2015, 12:15 PM  [facility] [address] [place], [place], [address] Phone: [phone]   Fax:  [phone]  Name: Veronica Garza MRN: [id number] Date of Birth: Aug 30, 1941

## 2015-12-26 ENCOUNTER — Ambulatory Visit: Payer: Medicare Other | Admitting: *Deleted

## 2015-12-26 DIAGNOSIS — R5381 Other malaise: Secondary | ICD-10-CM | POA: Diagnosis not present

## 2015-12-26 DIAGNOSIS — M545 Low back pain, unspecified: Secondary | ICD-10-CM

## 2015-12-26 NOTE — Therapy (Signed)
[facility] [place], [place], [address] Phone: [phone]   Fax:  [phone]  Physical Therapy Treatment  Patient Details  Name: Veronica Garza MRN: [id number] Date of Birth: [birth year] Referring Provider: Suella Broad MD.  Encounter Date: 12/26/2015      PT End of Session - 12/26/15 1128    Visit Number 6   Number of Visits 12   Date for PT Re-Evaluation 01/26/16   PT Start Time 1115   PT Stop Time 1205   PT Time Calculation (min) 50 min      Past Medical History  Diagnosis Date  . Hypertension   . Anemia   . OSA (obstructive sleep apnea)   . GERD (gastroesophageal reflux disease)   . Arthritis   . Urinary incontinence   . Pneumonia     History of  . Cataract   . Hx of shoulder replacement     lt    Past Surgical History  Procedure Laterality Date  . Abdominal hysterectomy    . Vaginal sling    . Nasal sinus surgery    . Shoulder arthroscopy      Left  . Eye surgery      bilateral cataract extraction  . Upper gastrointestinal endoscopy    . Total shoulder arthroplasty  10/10/2011    Procedure: TOTAL SHOULDER ARTHROPLASTY;  Surgeon: Metta Clines Supple;  Location: [place];  Service: Orthopedics;  Laterality: Right;    There were no vitals filed for this visit.  Visit Diagnosis:  Right-sided low back pain without sciatica  Debility      Subjective Assessment - 12/26/15 1122    Subjective Pain is more today. I cooked and walked a lot and am paying for it now.   Limitations Sitting;Standing;Writing   How long can you sit comfortably? 15 minutes.   How long can you stand comfortably? 10 minutes.   How long can you walk comfortably? 1-2 blocks.   Diagnostic tests CT scan of lumbar scan.   Currently in Pain? Yes   Pain Score 5    Pain Location Back   Pain Orientation Right;Lower   Pain Descriptors / Indicators Pressure   Pain Type Acute pain   Pain Frequency Intermittent   Aggravating Factors   sitting, standing,walking   Pain Relieving Factors PT RXs                         [facility] Adult PT Treatment/Exercise - 12/26/15 0001    Exercises   Exercises Lumbar   Modalities   Modalities Electrical Stimulation;Moist Heat;Ultrasound   Moist Heat Therapy   Number Minutes Moist Heat 15 Minutes   Moist Heat Location Lumbar Spine   Electrical Stimulation   Electrical Stimulation Location Rt LB and gluteals premod 80-150 hz x 15 min  to tolerance hooklying   Electrical Stimulation Goals Pain;Tone   Ultrasound   Ultrasound Location RT Lumbar paras / QL   Ultrasound Parameters 1.5 w/cm2 x 10 mins in LT sidelying   Ultrasound Goals Pain   Manual Therapy   Manual Therapy Myofascial release   Myofascial Release MFR/TPR to R lumbar paraspinals/QL/ and RT glute with light/medium pressure to decrease tightness and pain in L sidelying                  PT Short Term Goals - 12/14/15 1224    PT SHORT TERM GOAL #1   Title Ind with a HEP.  Time 2   Period Weeks   Status On-going           PT Long Term Goals - 12/08/15 1438    PT LONG TERM GOAL #1   Title Sit 30 minutes with pain not > 2-3/10.   Time 4   Period Weeks   Status New   PT LONG TERM GOAL #2   Title Stand 20 minutes with pain not > 3/10.   Time 4   Period Weeks   Status New   PT LONG TERM GOAL #3   Title Walk a community distance wiht pain not > 3/10.   Time 4   Period Weeks   Status New   PT LONG TERM GOAL #4   Title Perform ADL's with pain not > 3/10.   Time 4   Period Weeks   Status New               Plan - 12/26/15 1129    Clinical Impression Statement Pt not doing as well today due to over doing it over the weekend with walking a lot. She had notable increased tightness in her RT side LB paras, QL, and Glute. No therex today due to increased pain and tightness. She had decreased pain after Rx and normal  response from modalities.   Pt will benefit from skilled  therapeutic intervention in order to improve on the following deficits Pain;Decreased activity tolerance   Rehab Potential Excellent   PT Frequency 3x / week   PT Duration 4 weeks   PT Treatment/Interventions ADLs/Self Care Home Management;Electrical Stimulation;Moist Heat;Therapeutic exercise;Therapeutic activities;Ultrasound;Patient/family education;Manual techniques   PT Next Visit Plan Continue modalities for pain control, STW/manual to low back and gluteals/hip; progress core as tolerated.   Consulted and Agree with Plan of Care Patient        Problem List Patient Active Problem List   Diagnosis Date Noted  . DDD (degenerative disc disease), lumbar 11/06/2015  . Cervical lymphadenopathy 10/18/2015  . Cervical adenopathy 10/17/2015  . Right-sided low back pain without sciatica 06/21/2015  . Diastolic dysfunction without heart failure 11/13/2014  . Other abnormal glucose 05/27/2013  . Osteopenia of the elderly 05/27/2013  . Other and unspecified hyperlipidemia 09/04/2012  . Esophageal stricture 09/12/2011  . Other screening mammogram 08/08/2011  . Routine general medical examination at a health care facility 08/08/2011  . Essential hypertension, benign 01/14/2011  . URINARY INCONTINENCE 12/14/2008  . OA (osteoarthritis) 07/21/2008  . Obstructive sleep apnea 10/14/2007  . GERD 10/14/2007    Ogden Handlin,CHRIS, PTA 12/26/2015, 12:09 PM  [facility] [address] [place], [place], [address] Phone: [phone]   Fax:  [phone]  Name: Veronica Garza MRN: [id number] Date of Birth: 04-21-1941

## 2015-12-28 DIAGNOSIS — Z0000 Encounter for general adult medical examination without abnormal findings: Secondary | ICD-10-CM | POA: Diagnosis not present

## 2015-12-28 DIAGNOSIS — N3946 Mixed incontinence: Secondary | ICD-10-CM | POA: Diagnosis not present

## 2015-12-28 DIAGNOSIS — R338 Other retention of urine: Secondary | ICD-10-CM | POA: Diagnosis not present

## 2015-12-29 ENCOUNTER — Ambulatory Visit: Payer: Medicare Other | Admitting: *Deleted

## 2015-12-29 ENCOUNTER — Encounter: Payer: Self-pay | Admitting: *Deleted

## 2015-12-29 DIAGNOSIS — M545 Low back pain, unspecified: Secondary | ICD-10-CM

## 2015-12-29 DIAGNOSIS — R5381 Other malaise: Secondary | ICD-10-CM | POA: Diagnosis not present

## 2015-12-29 NOTE — Therapy (Signed)
[facility] [place], [place], [address] Phone: [phone]   Fax:  [phone]  Physical Therapy Treatment  Patient Details  Name: Veronica Garza MRN: [id number] Date of Birth: [birth year] Referring Provider: Suella Broad MD.  Encounter Date: 12/29/2015      PT End of Session - 12/29/15 1134    Visit Number 7   Number of Visits 12   PT Start Time 1115   PT Stop Time 1215   PT Time Calculation (min) 60 min      Past Medical History  Diagnosis Date  . Hypertension   . Anemia   . OSA (obstructive sleep apnea)   . GERD (gastroesophageal reflux disease)   . Arthritis   . Urinary incontinence   . Pneumonia     History of  . Cataract   . Hx of shoulder replacement     lt    Past Surgical History  Procedure Laterality Date  . Abdominal hysterectomy    . Vaginal sling    . Nasal sinus surgery    . Shoulder arthroscopy      Left  . Eye surgery      bilateral cataract extraction  . Upper gastrointestinal endoscopy    . Total shoulder arthroplasty  10/10/2011    Procedure: TOTAL SHOULDER ARTHROPLASTY;  Surgeon: Metta Clines Supple;  Location: [place];  Service: Orthopedics;  Laterality: Right;    There were no vitals filed for this visit.  Visit Diagnosis:  Right-sided low back pain without sciatica  Debility      Subjective Assessment - 12/29/15 1134    Subjective pain is less today 3-4/10   Limitations Sitting;Standing;Writing   How long can you sit comfortably? 15 minutes.   How long can you stand comfortably? 10 minutes.   How long can you walk comfortably? 1-2 blocks.   Diagnostic tests CT scan of lumbar scan.   Currently in Pain? Yes   Pain Score 3    Pain Location Back   Pain Orientation Right;Lower   Pain Type Acute pain   Pain Frequency Intermittent   Aggravating Factors  sitting , standing , walking   Pain Relieving Factors PT RXs                         [facility] Adult PT  Treatment/Exercise - 12/29/15 0001    Exercises   Exercises Lumbar   Lumbar Exercises: Stretches   Single Knee to Chest Stretch 3 reps;30 seconds;Other (comment)  RLE   Piriformis Stretch 3 reps;30 seconds;Other (comment)  RLE figure 4 stretch   Lumbar Exercises: Supine   Ab Set 20 reps;5 seconds   Bent Knee Raise 20 reps;3 seconds  Performed with Drawin and verbal cues to breath normally   Bridge Other (comment);5 reps  Attempted but patient still reports increased pain   Modalities   Modalities Electrical Stimulation;Moist Heat;Ultrasound   Moist Heat Therapy   Number Minutes Moist Heat 15 Minutes   Moist Heat Location Lumbar Spine   Electrical Stimulation   Electrical Stimulation Location Rt LB and gluteals premod 80-150 hz x 15 min  to tolerance hooklying   Electrical Stimulation Goals Pain;Tone   Ultrasound   Ultrasound Location RT Lumbar paras and SIJ   Ultrasound Parameters 1.5/cm2 x 10 mins   Ultrasound Goals Pain   Manual Therapy   Manual Therapy Myofascial release   Myofascial Release MFR/TPR to R lumbar paraspinals/QL/ and RT glute with light/medium  pressure to decrease tightness and pain in L sidelying     Instructed Pt in Home TENS use for pain control for LB. All programs and precautions reviewed.             PT Short Term Goals - 12/29/15 1214    PT SHORT TERM GOAL #1   Title Ind with a HEP.   Time 2   Period Weeks   Status On-going           PT Long Term Goals - 12/08/15 1438    PT LONG TERM GOAL #1   Title Sit 30 minutes with pain not > 2-3/10.   Time 4   Period Weeks   Status New   PT LONG TERM GOAL #2   Title Stand 20 minutes with pain not > 3/10.   Time 4   Period Weeks   Status New   PT LONG TERM GOAL #3   Title Walk a community distance wiht pain not > 3/10.   Time 4   Period Weeks   Status New   PT LONG TERM GOAL #4   Title Perform ADL's with pain not > 3/10.   Time 4   Period Weeks   Status New                Plan - 12/29/15 1219    Clinical Impression Statement Pt did better with core exs today, but was still only able to perform 5 bridges due to pain. She purchased a home TENS unit for pain management at home and was instructed in use. She had less notable  tension and TPs in LB paras and QL today and was not as sore at RT SIJ. She has met STGs, but unable to meet LTGs due to pain.   Pt will benefit from skilled therapeutic intervention in order to improve on the following deficits Pain;Decreased activity tolerance   Rehab Potential Excellent   PT Frequency 3x / week   PT Duration 4 weeks   PT Treatment/Interventions ADLs/Self Care Home Management;Electrical Stimulation;Moist Heat;Therapeutic exercise;Therapeutic activities;Ultrasound;Patient/family education;Manual techniques   PT Next Visit Plan Continue modalities for pain control, STW/manual to low back and gluteals/hip; progress core as tolerated.   Consulted and Agree with Plan of Care Patient        Problem List Patient Active Problem List   Diagnosis Date Noted  . DDD (degenerative disc disease), lumbar 11/06/2015  . Cervical lymphadenopathy 10/18/2015  . Cervical adenopathy 10/17/2015  . Right-sided low back pain without sciatica 06/21/2015  . Diastolic dysfunction without heart failure 11/13/2014  . Other abnormal glucose 05/27/2013  . Osteopenia of the elderly 05/27/2013  . Other and unspecified hyperlipidemia 09/04/2012  . Esophageal stricture 09/12/2011  . Other screening mammogram 08/08/2011  . Routine general medical examination at a health care facility 08/08/2011  . Essential hypertension, benign 01/14/2011  . URINARY INCONTINENCE 12/14/2008  . OA (osteoarthritis) 07/21/2008  . Obstructive sleep apnea 10/14/2007  . GERD 10/14/2007    RAMSEUR,CHRIS, PTA 12/29/2015, 12:31 PM  [facility] [place], [place], [address] Phone: [phone]   Fax:   [phone]  Name: Veronica Garza MRN: [id number] Date of Birth: Sep 25, 1941

## 2016-01-02 ENCOUNTER — Ambulatory Visit: Payer: Medicare Other | Admitting: Physical Therapy

## 2016-01-02 DIAGNOSIS — M545 Low back pain, unspecified: Secondary | ICD-10-CM

## 2016-01-02 DIAGNOSIS — R5381 Other malaise: Secondary | ICD-10-CM | POA: Diagnosis not present

## 2016-01-02 NOTE — Therapy (Signed)
[facility] [place], [place], [address] Phone: [phone]   Fax:  [phone]  Physical Therapy Treatment  Patient Details  Name: Veronica Garza MRN: [id number] Date of Birth: 1941-09-13 Referring Provider: Suella Broad MD.  Encounter Date: 01/02/2016      PT End of Session - 01/02/16 1245    Activity Tolerance Patient tolerated treatment well   Behavior During Therapy [facility] for tasks assessed/performed      Past Medical History  Diagnosis Date  . Hypertension   . Anemia   . OSA (obstructive sleep apnea)   . GERD (gastroesophageal reflux disease)   . Arthritis   . Urinary incontinence   . Pneumonia     History of  . Cataract   . Hx of shoulder replacement     lt    Past Surgical History  Procedure Laterality Date  . Abdominal hysterectomy    . Vaginal sling    . Nasal sinus surgery    . Shoulder arthroscopy      Left  . Eye surgery      bilateral cataract extraction  . Upper gastrointestinal endoscopy    . Total shoulder arthroplasty  10/10/2011    Procedure: TOTAL SHOULDER ARTHROPLASTY;  Surgeon: Metta Clines Supple;  Location: [place];  Service: Orthopedics;  Laterality: Right;    There were no vitals filed for this visit.  Visit Diagnosis:  Right-sided low back pain without sciatica  Debility      Subjective Assessment - 01/02/16 1241    Subjective I'm at least 50% better.   Limitations Sitting;Standing;Writing   How long can you sit comfortably? 15 minutes.   How long can you stand comfortably? 10 minutes.   How long can you walk comfortably? 1-2 blocks.   Diagnostic tests CT scan of lumbar scan.   Pain Score 3    Pain Location Back   Pain Orientation Right;Lower   Pain Descriptors / Indicators Pressure   Pain Type Acute pain   Pain Frequency Intermittent                         [facility] Adult PT Treatment/Exercise - 01/02/16 0001    Modalities   Modalities Electrical  Stimulation   Moist Heat Therapy   Number Minutes Moist Heat 15 Minutes   Moist Heat Location Lumbar Spine   Electrical Stimulation   Electrical Stimulation Location Right LB   Electrical Stimulation Action Pre-mod x 15 minutes.   Electrical Stimulation Goals Tone;Pain   Ultrasound   Ultrasound Location Right LB in left sdly position.   Ultrasound Parameters 1.50 W/CM2 x 12 minutes.   Ultrasound Goals Pain   Manual Therapy   Manual Therapy Myofascial release   Myofascial Release STW/TP release technique x 11 minutes.                  PT Short Term Goals - 12/29/15 1214    PT SHORT TERM GOAL #1   Title Ind with a HEP.   Time 2   Period Weeks   Status On-going           PT Long Term Goals - 12/08/15 1438    PT LONG TERM GOAL #1   Title Sit 30 minutes with pain not > 2-3/10.   Time 4   Period Weeks   Status New   PT LONG TERM GOAL #2   Title Stand 20 minutes with pain not > 3/10.  Time 4   Period Weeks   Status New   PT LONG TERM GOAL #3   Title Walk a community distance wiht pain not > 3/10.   Time 4   Period Weeks   Status New   PT LONG TERM GOAL #4   Title Perform ADL's with pain not > 3/10.   Time 4   Period Weeks   Status New               Problem List Patient Active Problem List   Diagnosis Date Noted  . DDD (degenerative disc disease), lumbar 11/06/2015  . Cervical lymphadenopathy 10/18/2015  . Cervical adenopathy 10/17/2015  . Right-sided low back pain without sciatica 06/21/2015  . Diastolic dysfunction without heart failure 11/13/2014  . Other abnormal glucose 05/27/2013  . Osteopenia of the elderly 05/27/2013  . Other and unspecified hyperlipidemia 09/04/2012  . Esophageal stricture 09/12/2011  . Other screening mammogram 08/08/2011  . Routine general medical examination at a health care facility 08/08/2011  . Essential hypertension, benign 01/14/2011  . URINARY INCONTINENCE 12/14/2008  . OA (osteoarthritis) 07/21/2008   . Obstructive sleep apnea 10/14/2007  . GERD 10/14/2007    Manfred Laspina, [place]  MPT  01/02/2016, 12:47 PM  [facility] [address] [place], [place], [address] Phone: [phone]   Fax:  [phone]  Name: Veronica Garza MRN: [id number] Date of Birth: January 05, 1941

## 2016-01-05 ENCOUNTER — Ambulatory Visit: Payer: Medicare Other | Attending: Physical Medicine and Rehabilitation | Admitting: Physical Therapy

## 2016-01-05 DIAGNOSIS — M545 Low back pain, unspecified: Secondary | ICD-10-CM

## 2016-01-05 DIAGNOSIS — R5381 Other malaise: Secondary | ICD-10-CM | POA: Diagnosis not present

## 2016-01-05 NOTE — Therapy (Signed)
[facility] [place], [place], [address] Phone: [phone]   Fax:  [phone]  Physical Therapy Treatment  Patient Details  Name: Veronica Garza MRN: [id number] Date of Birth: 1941-12-02 Referring Provider: Suella Broad MD.  Encounter Date: 01/05/2016      PT End of Session - 01/05/16 1057    Visit Number 9   Number of Visits 12   Date for PT Re-Evaluation 01/26/16   PT Start Time 0900   PT Stop Time 0952   PT Time Calculation (min) 52 min   Activity Tolerance Patient tolerated treatment well   Behavior During Therapy [facility] for tasks assessed/performed      Past Medical History  Diagnosis Date  . Hypertension   . Anemia   . OSA (obstructive sleep apnea)   . GERD (gastroesophageal reflux disease)   . Arthritis   . Urinary incontinence   . Pneumonia     History of  . Cataract   . Hx of shoulder replacement     lt    Past Surgical History  Procedure Laterality Date  . Abdominal hysterectomy    . Vaginal sling    . Nasal sinus surgery    . Shoulder arthroscopy      Left  . Eye surgery      bilateral cataract extraction  . Upper gastrointestinal endoscopy    . Total shoulder arthroplasty  10/10/2011    Procedure: TOTAL SHOULDER ARTHROPLASTY;  Surgeon: Metta Clines Supple;  Location: [place];  Service: Orthopedics;  Laterality: Right;    There were no vitals filed for this visit.  Visit Diagnosis:  Right-sided low back pain without sciatica  Debility      Subjective Assessment - 01/05/16 1057    Subjective I'm much better today.  I rested a lot yesterday.   Limitations Sitting;Standing;Writing   How long can you stand comfortably? 10 minutes.   How long can you walk comfortably? 1-2 blocks.   Diagnostic tests CT scan of lumbar scan.   Currently in Pain? Yes   Pain Score 2    Pain Location Back   Pain Orientation Right;Lower   Pain Descriptors / Indicators Pressure   Pain Type Acute pain   Pain  Frequency Intermittent                         [facility] Adult PT Treatment/Exercise - 01/05/16 0001    Modalities   Modalities Electrical Stimulation;Moist Heat;Ultrasound   Moist Heat Therapy   Number Minutes Moist Heat 15 Minutes   Moist Heat Location Lumbar Spine   Electrical Stimulation   Electrical Stimulation Location RT LB   Electrical Stimulation Action Constant Pre-mod e'stim   Electrical Stimulation Parameters 80-150 HZ.   Electrical Stimulation Goals Tone;Pain   Ultrasound   Ultrasound Location RT LB   Ultrasound Parameters 1.50 W/CM2 x 11 minutes.   Ultrasound Goals Pain   Manual Therapy   Manual Therapy Soft tissue mobilization   Manual therapy comments TP release to RT multifidus x 12 minutes.                  PT Short Term Goals - 12/29/15 1214    PT SHORT TERM GOAL #1   Title Ind with a HEP.   Time 2   Period Weeks   Status On-going           PT Long Term Goals - 12/08/15 1438    PT  LONG TERM GOAL #1   Title Sit 30 minutes with pain not > 2-3/10.   Time 4   Period Weeks   Status New   PT LONG TERM GOAL #2   Title Stand 20 minutes with pain not > 3/10.   Time 4   Period Weeks   Status New   PT LONG TERM GOAL #3   Title Walk a community distance wiht pain not > 3/10.   Time 4   Period Weeks   Status New   PT LONG TERM GOAL #4   Title Perform ADL's with pain not > 3/10.   Time 4   Period Weeks   Status New               Problem List Patient Active Problem List   Diagnosis Date Noted  . DDD (degenerative disc disease), lumbar 11/06/2015  . Cervical lymphadenopathy 10/18/2015  . Cervical adenopathy 10/17/2015  . Right-sided low back pain without sciatica 06/21/2015  . Diastolic dysfunction without heart failure 11/13/2014  . Other abnormal glucose 05/27/2013  . Osteopenia of the elderly 05/27/2013  . Other and unspecified hyperlipidemia 09/04/2012  . Esophageal stricture 09/12/2011  . Other screening  mammogram 08/08/2011  . Routine general medical examination at a health care facility 08/08/2011  . Essential hypertension, benign 01/14/2011  . URINARY INCONTINENCE 12/14/2008  . OA (osteoarthritis) 07/21/2008  . Obstructive sleep apnea 10/14/2007  . GERD 10/14/2007    Vega Withrow, [place] MPT 01/05/2016, 11:11 AM  [facility] [address] [place], [place], [address] Phone: [phone]   Fax:  [phone]  Name: Veronica Garza MRN: [id number] Date of Birth: 1941-04-04

## 2016-01-09 ENCOUNTER — Ambulatory Visit: Payer: Medicare Other | Admitting: Physical Therapy

## 2016-01-09 DIAGNOSIS — R5381 Other malaise: Secondary | ICD-10-CM | POA: Diagnosis not present

## 2016-01-09 DIAGNOSIS — M545 Low back pain, unspecified: Secondary | ICD-10-CM

## 2016-01-09 NOTE — Patient Instructions (Signed)
Bridge   Lie back, legs bent. Engage abdominals. Squeeze buttocks and lift hips up.  Repeat 10-30 times. Do __2__ sessions per day.   Iliotibial Band Stretch, Side-Lying   Lie on side, back to edge of bed, top arm in front. Allow top leg to drape behind over edge. Hold 30 or more seconds.  Repeat 3 times per session. Do _2-3 sessions per day.   Madelyn Flavors, PT 01/09/2016 [facility] [place], [place], [address] Phone: [phone]   Fax:  [phone]

## 2016-01-09 NOTE — Therapy (Signed)
[facility] [place], [place], [address] Phone: [phone]   Fax:  [phone]  Physical Therapy Treatment  Patient Details  Name: Veronica Garza MRN: [id number] Date of Birth: 16-Oct-1941 Referring Provider: Suella Broad MD.  Encounter Date: 01/09/2016      PT End of Session - 01/09/16 1259    Visit Number 10   Number of Visits 12   Date for PT Re-Evaluation 01/26/16   PT Start Time 1301   PT Stop Time 1405   PT Time Calculation (min) 64 min   Activity Tolerance Patient tolerated treatment well   Behavior During Therapy [facility] for tasks assessed/performed      Past Medical History  Diagnosis Date  . Hypertension   . Anemia   . OSA (obstructive sleep apnea)   . GERD (gastroesophageal reflux disease)   . Arthritis   . Urinary incontinence   . Pneumonia     History of  . Cataract   . Hx of shoulder replacement     lt    Past Surgical History  Procedure Laterality Date  . Abdominal hysterectomy    . Vaginal sling    . Nasal sinus surgery    . Shoulder arthroscopy      Left  . Eye surgery      bilateral cataract extraction  . Upper gastrointestinal endoscopy    . Total shoulder arthroplasty  10/10/2011    Procedure: TOTAL SHOULDER ARTHROPLASTY;  Surgeon: Metta Clines Supple;  Location: [place];  Service: Orthopedics;  Laterality: Right;    There were no vitals filed for this visit.  Visit Diagnosis:  Right-sided low back pain without sciatica  Debility      Subjective Assessment - 01/09/16 1306    Subjective Yesterday I did not have a good day. I overdid it going up and down stairs and cleaning out closets. Today I'm better.   Limitations Sitting;Standing   How long can you sit comfortably? 60 minutes   How long can you stand comfortably? 30 min   How long can you walk comfortably? able to walk around [employer]   Currently in Pain? Yes   Pain Score 4    Pain Location Back   Pain Orientation Right;Lower    Pain Descriptors / Indicators Pressure   Pain Type Acute pain   Pain Frequency Intermittent   Aggravating Factors  sitting and standing   Pain Relieving Factors PT                         [facility] Adult PT Treatment/Exercise - 01/09/16 0001    Lumbar Exercises: Stretches   ITB Stretch 1 rep;60 seconds  SDLY   Lumbar Exercises: Supine   Heel Slides 10 reps  alt leg press (not heel slide)   Bridge 10 reps   Modalities   Modalities Electrical Stimulation;Moist Heat;Ultrasound   Moist Heat Therapy   Number Minutes Moist Heat 15 Minutes   Moist Heat Location Lumbar Spine  and glut med R   Acupuncturist Stimulation Location RT LB and glut med   Electrical Stimulation Action premod 80-150 Hz to tolerance x 15 min   Electrical Stimulation Goals Pain;Tone   Ultrasound   Ultrasound Location Rt LB and glut med   Ultrasound Parameters 1.5 w/cm2 1 mhz cont x 10 min   Ultrasound Goals Pain   Manual Therapy   Manual Therapy Soft tissue mobilization   Soft tissue mobilization  to R lumbar praspinals and gluts                PT Education - 2016-01-20 1552    Education provided Yes   Education Details HEP:    Person(s) Educated Patient   Methods Explanation;Demonstration;Handout   Comprehension Verbalized understanding;Returned demonstration          PT Short Term Goals - January 20, 2016 1310    PT SHORT TERM GOAL #1   Title Ind with a HEP.   Time 2   Period Weeks   Status Achieved           PT Long Term Goals - 01/20/16 1310    PT LONG TERM GOAL #1   Title Sit 30 minutes with pain not > 2-3/10.   Baseline reports 4-5/10   Time 4   Period Weeks   Status On-going   PT LONG TERM GOAL #2   Title Stand 20 minutes with pain not > 3/10.   Baseline a little more than a 3 per patient   Time 4   Period Weeks   Status On-going   PT LONG TERM GOAL #3   Title Walk a community distance wiht pain not > 3/10.   Baseline 4-5/10   Time 4    Period Weeks   Status On-going   PT LONG TERM GOAL #4   Title Perform ADL's with pain not > 3/10.   Baseline 5/10   Time 4   Period Weeks   Status On-going               Plan - 2016/01/20 1314    Clinical Impression Statement Patient reports 50% improvement overall. She did well with bridging today where previously it was too painful. She still is hypersensitive to light touch at R L paraspinals and glut medius, but was able to tolerate gradually increased pressure with manual therapy. She did well with SDLY ITB stretch as long as pillow between legs. After treatement she reported 3/10 pain. Goals are ongoing.   Pt will benefit from skilled therapeutic intervention in order to improve on the following deficits Pain;Decreased activity tolerance   PT Duration 4 weeks   PT Next Visit Plan Continue modalities for pain control, STW/manual to low back and gluteals/hip; progress core as tolerated.   PT Home Exercise Plan bridging and SDLY ITB stretch off EOB   Consulted and Agree with Plan of Care Patient          G-Codes - 01-20-2016 1557    Functional Assessment Tool Used FOTO....57% limitation vs 60 at intake.   Functional Limitation Mobility: Walking and moving around   Mobility: Walking and Moving Around Current Status [phone]) At least 40 percent but less than 60 percent impaired, limited or restricted   Mobility: Walking and Moving Around Goal Status [phone]) At least 40 percent but less than 60 percent impaired, limited or restricted      Problem List Patient Active Problem List   Diagnosis Date Noted  . DDD (degenerative disc disease), lumbar 11/06/2015  . Cervical lymphadenopathy 10/18/2015  . Cervical adenopathy 10/17/2015  . Right-sided low back pain without sciatica 06/21/2015  . Diastolic dysfunction without heart failure 11/13/2014  . Other abnormal glucose 05/27/2013  . Osteopenia of the elderly 05/27/2013  . Other and unspecified hyperlipidemia 09/04/2012  .  Esophageal stricture 09/12/2011  . Other screening mammogram 08/08/2011  . Routine general medical examination at a health care facility 08/08/2011  . Essential hypertension, benign 01/14/2011  .  URINARY INCONTINENCE 12/14/2008  . OA (osteoarthritis) 07/21/2008  . Obstructive sleep apnea 10/14/2007  . GERD 10/14/2007    Madelyn Flavors PT  01/09/2016, 4:03 PM  [facility] [address] [place], [place], [address] Phone: [phone]   Fax:  [phone]  Name: MATTISEN WYNDER MRN: [id number] Date of Birth: 04-05-1941

## 2016-01-11 ENCOUNTER — Ambulatory Visit: Payer: Medicare Other | Admitting: *Deleted

## 2016-01-11 DIAGNOSIS — M545 Low back pain, unspecified: Secondary | ICD-10-CM

## 2016-01-11 DIAGNOSIS — R5381 Other malaise: Secondary | ICD-10-CM

## 2016-01-11 NOTE — Therapy (Signed)
[facility] [place], [place], [address] Phone: [phone]   Fax:  [phone]  Physical Therapy Treatment  Patient Details  Name: Veronica Garza MRN: [id number] Date of Birth: 06-12-1941 Referring Provider: Suella Broad MD.  Encounter Date: 01/11/2016      PT End of Session - 01/11/16 1316    Visit Number 11   Number of Visits 12   Date for PT Re-Evaluation 01/26/16   PT Start Time 1300   PT Stop Time 1350   PT Time Calculation (min) 50 min   Activity Tolerance Patient tolerated treatment well   Behavior During Therapy [facility] for tasks assessed/performed      Past Medical History  Diagnosis Date  . Hypertension   . Anemia   . OSA (obstructive sleep apnea)   . GERD (gastroesophageal reflux disease)   . Arthritis   . Urinary incontinence   . Pneumonia     History of  . Cataract   . Hx of shoulder replacement     lt    Past Surgical History  Procedure Laterality Date  . Abdominal hysterectomy    . Vaginal sling    . Nasal sinus surgery    . Shoulder arthroscopy      Left  . Eye surgery      bilateral cataract extraction  . Upper gastrointestinal endoscopy    . Total shoulder arthroplasty  10/10/2011    Procedure: TOTAL SHOULDER ARTHROPLASTY;  Surgeon: Metta Clines Supple;  Location: [place];  Service: Orthopedics;  Laterality: Right;    There were no vitals filed for this visit.  Visit Diagnosis:  Right-sided low back pain without sciatica  Debility      Subjective Assessment - 01/11/16 1311    Subjective I cleaned house today and vacuumed. My my LBP is 4-5/10   Limitations Sitting;Standing   How long can you sit comfortably? 60 minutes   How long can you stand comfortably? 30 min   How long can you walk comfortably? able to walk around [employer]   Diagnostic tests CT scan of lumbar scan.   Currently in Pain? Yes   Pain Score 4    Pain Location Back   Pain Orientation Right;Lower   Pain Descriptors  / Indicators Pressure;Aching   Pain Type Acute pain   Pain Frequency Intermittent   Aggravating Factors  sitting and standing                         [facility] Adult PT Treatment/Exercise - 01/11/16 0001    Lumbar Exercises: Stretches   Piriformis Stretch 3 reps;30 seconds;Other (comment)  RLE figure 4 stretch   Lumbar Exercises: Supine   Ab Set 20 reps;5 seconds   Bent Knee Raise 20 reps;3 seconds  Marching   Bridge 10 reps   Modalities   Modalities Electrical Stimulation;Moist Heat;Ultrasound   Moist Heat Therapy   Number Minutes Moist Heat 15 Minutes   Moist Heat Location Lumbar Spine  and glut med R   Electrical Stimulation   Electrical Stimulation Location Rt LB and gluteals premod 80-150 hz x 15 min  to tolerance hooklying   Electrical Stimulation Goals Pain;Tone   Ultrasound   Ultrasound Location RT LB paras   Ultrasound Parameters 1.5 w/cmm2 x 10 mins   Ultrasound Goals Pain                  PT Short Term Goals - 01/11/16 1319  PT SHORT TERM GOAL #1   Title Ind with a HEP.   Time 2   Period Weeks   Status Achieved           PT Long Term Goals - 01/11/16 1319    PT LONG TERM GOAL #1   Title Sit 30 minutes with pain not > 2-3/10.   Baseline reports 4-5/10   Time 4   Period Weeks   Status Achieved   PT LONG TERM GOAL #2   Title Stand 20 minutes with pain not > 3/10.   Baseline a little more than a 3 per patient   Time 4   Period Weeks   Status On-going   PT LONG TERM GOAL #3   Title Walk a community distance wiht pain not > 3/10.   Baseline 4-5/10   Time 4   Period Weeks   Status On-going   PT LONG TERM GOAL #4   Title Perform ADL's with pain not > 3/10.   Baseline 5/10   Time 4   Period Weeks   Status On-going               Plan - 01/11/16 1318    Clinical Impression Statement Pt continues to progress with PT and feels 40-50% better overall. She was able to meet LTG for sitting , but not for standing and  ADLs due to pain and are on-going.   Pt will benefit from skilled therapeutic intervention in order to improve on the following deficits Pain;Decreased activity tolerance   Rehab Potential Excellent   PT Frequency 3x / week   PT Duration 4 weeks   PT Treatment/Interventions ADLs/Self Care Home Management;Electrical Stimulation;Moist Heat;Therapeutic exercise;Therapeutic activities;Ultrasound;Patient/family education;Manual techniques   PT Next Visit Plan Continue modalities for pain control, STW/manual to low back and gluteals/hip; progress core as tolerated.   Routed for  [facility]    Recert   PT Home Exercise Plan bridging and SDLY ITB stretch off EOB   Consulted and Agree with Plan of Care Patient        Problem List Patient Active Problem List   Diagnosis Date Noted  . DDD (degenerative disc disease), lumbar 11/06/2015  . Cervical lymphadenopathy 10/18/2015  . Cervical adenopathy 10/17/2015  . Right-sided low back pain without sciatica 06/21/2015  . Diastolic dysfunction without heart failure 11/13/2014  . Other abnormal glucose 05/27/2013  . Osteopenia of the elderly 05/27/2013  . Other and unspecified hyperlipidemia 09/04/2012  . Esophageal stricture 09/12/2011  . Other screening mammogram 08/08/2011  . Routine general medical examination at a health care facility 08/08/2011  . Essential hypertension, benign 01/14/2011  . URINARY INCONTINENCE 12/14/2008  . OA (osteoarthritis) 07/21/2008  . Obstructive sleep apnea 10/14/2007  . GERD 10/14/2007    RAMSEUR,CHRIS, PTA 01/11/2016, 2:00 PM  [facility] [address] [place], [place], [address] Phone: [phone]   Fax:  [phone]  Name: Veronica Garza MRN: [id number] Date of Birth: [birth year]

## 2016-01-16 ENCOUNTER — Ambulatory Visit: Payer: Medicare Other | Admitting: Physical Therapy

## 2016-01-16 ENCOUNTER — Encounter: Payer: Self-pay | Admitting: Physical Therapy

## 2016-01-16 DIAGNOSIS — R5381 Other malaise: Secondary | ICD-10-CM | POA: Diagnosis not present

## 2016-01-16 DIAGNOSIS — M545 Low back pain, unspecified: Secondary | ICD-10-CM

## 2016-01-16 NOTE — Therapy (Signed)
[facility] [place], [place], [address] Phone: [phone]   Fax:  [phone]  Physical Therapy Treatment  Patient Details  Name: Veronica Garza MRN: [id number] Date of Birth: 08/06/1941 Referring Provider: Suella Broad MD.  Encounter Date: 01/16/2016      PT End of Session - 01/16/16 1033    Visit Number 12   Number of Visits 12   Date for PT Re-Evaluation 01/26/16   PT Start Time 1033   PT Stop Time 1118   PT Time Calculation (min) 45 min   Activity Tolerance Patient tolerated treatment well   Behavior During Therapy [facility] for tasks assessed/performed      Past Medical History  Diagnosis Date  . Hypertension   . Anemia   . OSA (obstructive sleep apnea)   . GERD (gastroesophageal reflux disease)   . Arthritis   . Urinary incontinence   . Pneumonia     History of  . Cataract   . Hx of shoulder replacement     lt    Past Surgical History  Procedure Laterality Date  . Abdominal hysterectomy    . Vaginal sling    . Nasal sinus surgery    . Shoulder arthroscopy      Left  . Eye surgery      bilateral cataract extraction  . Upper gastrointestinal endoscopy    . Total shoulder arthroplasty  10/10/2011    Procedure: TOTAL SHOULDER ARTHROPLASTY;  Surgeon: Metta Clines Supple;  Location: [place];  Service: Orthopedics;  Laterality: Right;    There were no vitals filed for this visit.  Visit Diagnosis:  Right-sided low back pain without sciatica  Debility      Subjective Assessment - 01/16/16 1032    Subjective Patient has been taking it easy and completing exercises. States that she has been sitting a lot.   Limitations Sitting;Standing   How long can you sit comfortably? 60 minutes   How long can you stand comfortably? 30 min   How long can you walk comfortably? able to walk around [employer]   Diagnostic tests CT scan of lumbar scan.   Currently in Pain? Yes   Pain Score 4    Pain Location Back   Pain  Orientation Right;Lower   Pain Descriptors / Indicators Sore   Pain Type Acute pain            [facility] PT Assessment - 01/16/16 0001    Assessment   Medical Diagnosis Chronic right sided low back pain without sciatica                     [facility] Adult PT Treatment/Exercise - 01/16/16 0001    Modalities   Modalities Electrical Stimulation;Moist Heat;Ultrasound   Moist Heat Therapy   Number Minutes Moist Heat 15 Minutes   Moist Heat Location Lumbar Spine   Electrical Stimulation   Electrical Stimulation Location R low back musculature   Electrical Stimulation Action Pre-Mod   Electrical Stimulation Parameters 80-150 Hz x15 min   Electrical Stimulation Goals Pain;Tone   Ultrasound   Ultrasound Location R lumbar paraspinals   Ultrasound Parameters 1.5 w/cm2, 100%,1 mhz x10 min   Ultrasound Goals Pain   Manual Therapy   Manual Therapy Myofascial release   Myofascial Release MFR/TPR to R lumbar paraspinals, QL to decrease tightness and pain                  PT Short Term Goals - 01/11/16  1319    PT SHORT TERM GOAL #1   Title Ind with a HEP.   Time 2   Period Weeks   Status Achieved           PT Long Term Goals - 01/11/16 1319    PT LONG TERM GOAL #1   Title Sit 30 minutes with pain not > 2-3/10.   Baseline reports 4-5/10   Time 4   Period Weeks   Status Achieved   PT LONG TERM GOAL #2   Title Stand 20 minutes with pain not > 3/10.   Baseline a little more than a 3 per patient   Time 4   Period Weeks   Status On-going   PT LONG TERM GOAL #3   Title Walk a community distance wiht pain not > 3/10.   Baseline 4-5/10   Time 4   Period Weeks   Status On-going   PT LONG TERM GOAL #4   Title Perform ADL's with pain not > 3/10.   Baseline 5/10   Time 4   Period Weeks   Status On-going               Plan - 01/16/16 1221    Clinical Impression Statement Patient tolerated today's treatment well although she continues to have an area  of soreness and tightness in R lumbar paraspinals region. Patient could verbally account for whenever palpation was directly over the area of tightness and did several times during today's manual therapy over that region. Patient demonstrated normal response of moist heat and stimulation following removal of the modalities. Patient not discharged at this time due to recert being sent to MD. Patient rated improvement overall 50%. Patient denied pain following today's treatment.   Pt will benefit from skilled therapeutic intervention in order to improve on the following deficits Pain;Decreased activity tolerance   Rehab Potential Excellent   PT Frequency 3x / week   PT Duration 4 weeks   PT Treatment/Interventions ADLs/Self Care Home Management;Electrical Stimulation;Moist Heat;Therapeutic exercise;Therapeutic activities;Ultrasound;Patient/family education;Manual techniques   PT Next Visit Plan Continue modalities for pain control, STW/manual to low back and gluteals/hip; progress core as tolerated.      PT Home Exercise Plan bridging and SDLY ITB stretch off EOB   Consulted and Agree with Plan of Care Patient        Problem List Patient Active Problem List   Diagnosis Date Noted  . DDD (degenerative disc disease), lumbar 11/06/2015  . Cervical lymphadenopathy 10/18/2015  . Cervical adenopathy 10/17/2015  . Right-sided low back pain without sciatica 06/21/2015  . Diastolic dysfunction without heart failure 11/13/2014  . Other abnormal glucose 05/27/2013  . Osteopenia of the elderly 05/27/2013  . Other and unspecified hyperlipidemia 09/04/2012  . Esophageal stricture 09/12/2011  . Other screening mammogram 08/08/2011  . Routine general medical examination at a health care facility 08/08/2011  . Essential hypertension, benign 01/14/2011  . URINARY INCONTINENCE 12/14/2008  . OA (osteoarthritis) 07/21/2008  . Obstructive sleep apnea 10/14/2007  . GERD 10/14/2007    Wynelle Fanny,  PTA 01/16/2016, 1:13 PM  [facility] [address] [place], [place], [address] Phone: [phone]   Fax:  [phone]  Name: Veronica Garza MRN: [id number] Date of Birth: [birth year]

## 2016-01-18 ENCOUNTER — Ambulatory Visit: Payer: Medicare Other | Admitting: Physical Therapy

## 2016-01-18 DIAGNOSIS — M545 Low back pain, unspecified: Secondary | ICD-10-CM

## 2016-01-18 DIAGNOSIS — R5381 Other malaise: Secondary | ICD-10-CM | POA: Diagnosis not present

## 2016-01-18 NOTE — Therapy (Signed)
[facility] [place], [place], [address] Phone: [phone]   Fax:  [phone]  Physical Therapy Treatment  Patient Details  Name: Veronica Garza MRN: [id number] Date of Birth: 1941/11/02 Referring Provider: Suella Broad MD.  Encounter Date: 01/18/2016      PT End of Session - 01/18/16 1352    Visit Number 13   Number of Visits 24   Date for PT Re-Evaluation 03/10/16   PT Start Time [id number]   PT Stop Time 1448   PT Time Calculation (min) 55 min   Activity Tolerance Patient tolerated treatment well   Behavior During Therapy [facility] for tasks assessed/performed      Past Medical History  Diagnosis Date  . Hypertension   . Anemia   . OSA (obstructive sleep apnea)   . GERD (gastroesophageal reflux disease)   . Arthritis   . Urinary incontinence   . Pneumonia     History of  . Cataract   . Hx of shoulder replacement     lt    Past Surgical History  Procedure Laterality Date  . Abdominal hysterectomy    . Vaginal sling    . Nasal sinus surgery    . Shoulder arthroscopy      Left  . Eye surgery      bilateral cataract extraction  . Upper gastrointestinal endoscopy    . Total shoulder arthroplasty  10/10/2011    Procedure: TOTAL SHOULDER ARTHROPLASTY;  Surgeon: Metta Clines Supple;  Location: [place];  Service: Orthopedics;  Laterality: Right;    There were no vitals filed for this visit.  Visit Diagnosis:  Right-sided low back pain without sciatica  Debility      Subjective Assessment - 01/18/16 1354    Subjective Patient says she hasn't done much today but is going to [employer] afterward and will then need tens and heating pad.   Currently in Pain? Yes   Pain Score 3    Pain Location Back   Pain Orientation Right;Lower   Pain Descriptors / Indicators Sore                         [facility] Adult PT Treatment/Exercise - 01/18/16 0001    Lumbar Exercises: Supine   Ab Set 10 reps;5 seconds  with  cues for pelvic floor contraction   Heel Slides 10 reps  alt leg press (not heel slide)   Bent Knee Raise 20 reps  with cues for pelvic floor contraction   Bridge 10 reps  with pelvic floor contraction   Modalities   Modalities Electrical Stimulation;Moist Heat   Moist Heat Therapy   Number Minutes Moist Heat 15 Minutes   Moist Heat Location Lumbar Spine   Electrical Stimulation   Electrical Stimulation Location R low back and R glut med   Electrical Stimulation Action premod   Electrical Stimulation Parameters 80-150 Hz to tolerance x 15 min   Electrical Stimulation Goals Pain;Tone   Manual Therapy   Manual Therapy Soft tissue mobilization;Myofascial release   Soft tissue mobilization Deep to R glut med/max and piriformis   Myofascial Release TPR to glut med                  PT Short Term Goals - 01/11/16 1319    PT SHORT TERM GOAL #1   Title Ind with a HEP.   Time 2   Period Weeks   Status Achieved  PT Long Term Goals - 01/11/16 1319    PT LONG TERM GOAL #1   Title Sit 30 minutes with pain not > 2-3/10.   Baseline reports 4-5/10   Time 4   Period Weeks   Status Achieved   PT LONG TERM GOAL #2   Title Stand 20 minutes with pain not > 3/10.   Baseline a little more than a 3 per patient   Time 4   Period Weeks   Status On-going   PT LONG TERM GOAL #3   Title Walk a community distance wiht pain not > 3/10.   Baseline 4-5/10   Time 4   Period Weeks   Status On-going   PT LONG TERM GOAL #4   Title Perform ADL's with pain not > 3/10.   Baseline 5/10   Time 4   Period Weeks   Status On-going               Plan - 01/18/16 1534    Clinical Impression Statement Patient was able to isolate TrAb during exercises using pelvic floor cues. She has marked tightness in R glut med/max, but tolerated deep tissue work including TPR.    PT Next Visit Plan continue deep STW to gluts, add stretching to same; continue to progress core. Modalities  prn.        Problem List Patient Active Problem List   Diagnosis Date Noted  . DDD (degenerative disc disease), lumbar 11/06/2015  . Cervical lymphadenopathy 10/18/2015  . Cervical adenopathy 10/17/2015  . Right-sided low back pain without sciatica 06/21/2015  . Diastolic dysfunction without heart failure 11/13/2014  . Other abnormal glucose 05/27/2013  . Osteopenia of the elderly 05/27/2013  . Other and unspecified hyperlipidemia 09/04/2012  . Esophageal stricture 09/12/2011  . Other screening mammogram 08/08/2011  . Routine general medical examination at a health care facility 08/08/2011  . Essential hypertension, benign 01/14/2011  . URINARY INCONTINENCE 12/14/2008  . OA (osteoarthritis) 07/21/2008  . Obstructive sleep apnea 10/14/2007  . GERD 10/14/2007   Madelyn Flavors PT  01/18/2016, 3:42 PM  [facility] [address] [place], [place], [address] Phone: [phone]   Fax:  [phone]  Name: SHUNTAVIA ROOPNARINE MRN: [id number] Date of Birth: 12-Jul-1941

## 2016-01-23 ENCOUNTER — Ambulatory Visit: Payer: Medicare Other | Admitting: Physical Therapy

## 2016-01-23 DIAGNOSIS — M545 Low back pain, unspecified: Secondary | ICD-10-CM

## 2016-01-23 DIAGNOSIS — R5381 Other malaise: Secondary | ICD-10-CM | POA: Diagnosis not present

## 2016-01-23 NOTE — Therapy (Signed)
[facility] [place], [place], [address] Phone: [phone]   Fax:  [phone]  Physical Therapy Treatment  Patient Details  Name: Veronica Garza MRN: [id number] Date of Birth: 1941-02-10 Referring Provider: Suella Broad MD.  Encounter Date: 01/23/2016      PT End of Session - 01/23/16 1118    Visit Number 14   Number of Visits 24   Date for PT Re-Evaluation 03/10/16   PT Start Time [id number]   PT Stop Time 1206   PT Time Calculation (min) 45 min   Activity Tolerance Patient tolerated treatment well   Behavior During Therapy [facility] for tasks assessed/performed      Past Medical History  Diagnosis Date  . Hypertension   . Anemia   . OSA (obstructive sleep apnea)   . GERD (gastroesophageal reflux disease)   . Arthritis   . Urinary incontinence   . Pneumonia     History of  . Cataract   . Hx of shoulder replacement     lt    Past Surgical History  Procedure Laterality Date  . Abdominal hysterectomy    . Vaginal sling    . Nasal sinus surgery    . Shoulder arthroscopy      Left  . Eye surgery      bilateral cataract extraction  . Upper gastrointestinal endoscopy    . Total shoulder arthroplasty  10/10/2011    Procedure: TOTAL SHOULDER ARTHROPLASTY;  Surgeon: Metta Clines Supple;  Location: [place];  Service: Orthopedics;  Laterality: Right;    There were no vitals filed for this visit.  Visit Diagnosis:  Right-sided low back pain without sciatica  Debility      Subjective Assessment - 01/23/16 1117    Subjective Had very busy weekend and had increased pain. States that her back is "fair" but had 6/10 pain over the weekend.   Limitations Sitting;Standing   How long can you sit comfortably? 60 minutes   How long can you stand comfortably? 30 min   How long can you walk comfortably? able to walk around [employer]   Diagnostic tests CT scan of lumbar scan.   Currently in Pain? Yes   Pain Score 4    Pain Location  Back   Pain Orientation Right;Lower   Pain Type Acute pain            [facility] PT Assessment - 01/23/16 0001    Assessment   Medical Diagnosis Chronic right sided low back pain without sciatica                     [facility] Adult PT Treatment/Exercise - 01/23/16 0001    Modalities   Modalities Electrical Stimulation;Moist Heat;Ultrasound   Moist Heat Therapy   Number Minutes Moist Heat 15 Minutes   Moist Heat Location Lumbar Spine   Electrical Stimulation   Electrical Stimulation Location R low back    Electrical Stimulation Action Pre-Mod   Electrical Stimulation Parameters 80-150 Hz x15 min   Electrical Stimulation Goals Pain;Tone   Ultrasound   Ultrasound Location R lumbar paraspinals in L sidelying    Ultrasound Parameters 1.5 w/cm2, 100%,1 mhz x10 min   Ultrasound Goals Pain   Manual Therapy   Manual Therapy Myofascial release   Myofascial Release MFR/TPR to R lumbar paraspinals, QL, upper glute to decrease tightness and pain in L sidelying  PT Short Term Goals - 01/11/16 1319    PT SHORT TERM GOAL #1   Title Ind with a HEP.   Time 2   Period Weeks   Status Achieved           PT Long Term Goals - 01/11/16 1319    PT LONG TERM GOAL #1   Title Sit 30 minutes with pain not > 2-3/10.   Baseline reports 4-5/10   Time 4   Period Weeks   Status Achieved   PT LONG TERM GOAL #2   Title Stand 20 minutes with pain not > 3/10.   Baseline a little more than a 3 per patient   Time 4   Period Weeks   Status On-going   PT LONG TERM GOAL #3   Title Walk a community distance wiht pain not > 3/10.   Baseline 4-5/10   Time 4   Period Weeks   Status On-going   PT LONG TERM GOAL #4   Title Perform ADL's with pain not > 3/10.   Baseline 5/10   Time 4   Period Weeks   Status On-going               Plan - 01/23/16 1216    Clinical Impression Statement Patient tolerated conservative treatment well today due to the increased  tightness and pain she experienced over the weekend. Notable tightness continues to be observed in R lumbar paraspinals/ QL as well as into upper R Glute. Normal modalties response noted following removal of the modalities. Patient continues to be compliant with HEP at home per patient report. Patient experienced R low back feeling "much better" following today's treatment.   Pt will benefit from skilled therapeutic intervention in order to improve on the following deficits Pain;Decreased activity tolerance   Rehab Potential Excellent   PT Frequency 3x / week   PT Duration 4 weeks   PT Treatment/Interventions ADLs/Self Care Home Management;Electrical Stimulation;Moist Heat;Therapeutic exercise;Therapeutic activities;Ultrasound;Patient/family education;Manual techniques   PT Next Visit Plan continue deep STW to gluts, add stretching to same; continue to progress core. Modalities prn.   PT Home Exercise Plan bridging and SDLY ITB stretch off EOB   Consulted and Agree with Plan of Care Patient        Problem List Patient Active Problem List   Diagnosis Date Noted  . DDD (degenerative disc disease), lumbar 11/06/2015  . Cervical lymphadenopathy 10/18/2015  . Cervical adenopathy 10/17/2015  . Right-sided low back pain without sciatica 06/21/2015  . Diastolic dysfunction without heart failure 11/13/2014  . Other abnormal glucose 05/27/2013  . Osteopenia of the elderly 05/27/2013  . Other and unspecified hyperlipidemia 09/04/2012  . Esophageal stricture 09/12/2011  . Other screening mammogram 08/08/2011  . Routine general medical examination at a health care facility 08/08/2011  . Essential hypertension, benign 01/14/2011  . URINARY INCONTINENCE 12/14/2008  . OA (osteoarthritis) 07/21/2008  . Obstructive sleep apnea 10/14/2007  . GERD 10/14/2007    Wynelle Fanny, PTA 01/23/2016, 12:19 PM  [facility] [address] [place], [place],  [address] Phone: [phone]   Fax:  [phone]  Name: Veronica Garza MRN: [id number] Date of Birth: [birth year]

## 2016-01-25 ENCOUNTER — Encounter: Payer: Self-pay | Admitting: Physical Therapy

## 2016-01-25 ENCOUNTER — Ambulatory Visit: Payer: Medicare Other | Admitting: Physical Therapy

## 2016-01-25 DIAGNOSIS — M545 Low back pain, unspecified: Secondary | ICD-10-CM

## 2016-01-25 DIAGNOSIS — R5381 Other malaise: Secondary | ICD-10-CM | POA: Diagnosis not present

## 2016-01-25 NOTE — Therapy (Signed)
[facility] [place], [place], [address] Phone: [phone]   Fax:  [phone]  Physical Therapy Treatment  Patient Details  Name: Veronica Garza MRN: [id number] Date of Birth: 08-12-1941 Referring Provider: Suella Broad MD.  Encounter Date: 01/25/2016      PT End of Session - 01/25/16 1105    Visit Number 15   Number of Visits 24   Date for PT Re-Evaluation 03/10/16   PT Start Time 1031   PT Stop Time 1117   PT Time Calculation (min) 46 min   Activity Tolerance Patient tolerated treatment well   Behavior During Therapy [facility] for tasks assessed/performed      Past Medical History  Diagnosis Date  . Hypertension   . Anemia   . OSA (obstructive sleep apnea)   . GERD (gastroesophageal reflux disease)   . Arthritis   . Urinary incontinence   . Pneumonia     History of  . Cataract   . Hx of shoulder replacement     lt    Past Surgical History  Procedure Laterality Date  . Abdominal hysterectomy    . Vaginal sling    . Nasal sinus surgery    . Shoulder arthroscopy      Left  . Eye surgery      bilateral cataract extraction  . Upper gastrointestinal endoscopy    . Total shoulder arthroplasty  10/10/2011    Procedure: TOTAL SHOULDER ARTHROPLASTY;  Surgeon: Metta Clines Supple;  Location: [place];  Service: Orthopedics;  Laterality: Right;    There were no vitals filed for this visit.  Visit Diagnosis:  Right-sided low back pain without sciatica  Debility      Subjective Assessment - 01/25/16 1034    Subjective Patient reported 50% better overall and feels therapy is helping   Limitations Sitting;Standing   How long can you sit comfortably? 60 minutes   How long can you stand comfortably? 30 min   How long can you walk comfortably? able to walk around [employer]   Diagnostic tests CT scan of lumbar scan.   Currently in Pain? Yes   Pain Score 4    Pain Location Back   Pain Orientation Right;Lower   Pain  Descriptors / Indicators Sore   Pain Type Acute pain   Pain Onset More than a month ago   Aggravating Factors  prolong sitting or standing   Pain Relieving Factors laying down/rest                         [facility] Adult PT Treatment/Exercise - 01/25/16 0001    Moist Heat Therapy   Number Minutes Moist Heat 15 Minutes   Moist Heat Location Lumbar Spine   Electrical Stimulation   Electrical Stimulation Location R low back    Electrical Stimulation Action premod   Electrical Stimulation Parameters 80-150hz    Electrical Stimulation Goals Pain;Tone   Ultrasound   Ultrasound Location right lumbar paraspinals   Ultrasound Parameters 1.5 w/cm2/50%/46mhz x10 min   Ultrasound Goals Pain   Manual Therapy   Manual Therapy Myofascial release   Myofascial Release MFR/TPR to R lumbar paraspinals, QL, upper glute to decrease tightness and pain in L sidelying                  PT Short Term Goals - 01/11/16 1319    PT SHORT TERM GOAL #1   Title Ind with a HEP.   Time 2  Period Weeks   Status Achieved           PT Long Term Goals - 01/11/16 1319    PT LONG TERM GOAL #1   Title Sit 30 minutes with pain not > 2-3/10.   Baseline reports 4-5/10   Time 4   Period Weeks   Status Achieved   PT LONG TERM GOAL #2   Title Stand 20 minutes with pain not > 3/10.   Baseline a little more than a 3 per patient   Time 4   Period Weeks   Status On-going   PT LONG TERM GOAL #3   Title Walk a community distance wiht pain not > 3/10.   Baseline 4-5/10   Time 4   Period Weeks   Status On-going   PT LONG TERM GOAL #4   Title Perform ADL's with pain not > 3/10.   Baseline 5/10   Time 4   Period Weeks   Status On-going               Plan - 01/25/16 1109    Clinical Impression Statement Patient continues to progress and has reported 50% improvement overall. Patient is able to tolerate anyone position for longer yet not without soreness 4+/10 and above. Patient  reports doing HEP daily with no difficulty. Goals ongoing due to ongoing pain deficts.    Pt will benefit from skilled therapeutic intervention in order to improve on the following deficits Pain;Decreased activity tolerance   Rehab Potential Excellent   PT Frequency 3x / week   PT Duration 4 weeks   PT Treatment/Interventions ADLs/Self Care Home Management;Electrical Stimulation;Moist Heat;Therapeutic exercise;Therapeutic activities;Ultrasound;Patient/family education;Manual techniques   PT Next Visit Plan continue deep STW to gluts, add stretching to same; continue to progress core. Modalities prn.   Consulted and Agree with Plan of Care Patient        Problem List Patient Active Problem List   Diagnosis Date Noted  . DDD (degenerative disc disease), lumbar 11/06/2015  . Cervical lymphadenopathy 10/18/2015  . Cervical adenopathy 10/17/2015  . Right-sided low back pain without sciatica 06/21/2015  . Diastolic dysfunction without heart failure 11/13/2014  . Other abnormal glucose 05/27/2013  . Osteopenia of the elderly 05/27/2013  . Other and unspecified hyperlipidemia 09/04/2012  . Esophageal stricture 09/12/2011  . Other screening mammogram 08/08/2011  . Routine general medical examination at a health care facility 08/08/2011  . Essential hypertension, benign 01/14/2011  . URINARY INCONTINENCE 12/14/2008  . OA (osteoarthritis) 07/21/2008  . Obstructive sleep apnea 10/14/2007  . GERD 10/14/2007    Phillips Climes, PTA 01/25/2016, 11:19 AM  [facility] [place], [place], [address] Phone: [phone]   Fax:  [phone]  Name: Veronica Garza MRN: [id number] Date of Birth: [birth year]

## 2016-01-30 ENCOUNTER — Encounter: Payer: Medicare Other | Admitting: Physical Therapy

## 2016-01-31 ENCOUNTER — Encounter: Payer: Self-pay | Admitting: Physical Therapy

## 2016-01-31 ENCOUNTER — Ambulatory Visit: Payer: Medicare Other | Attending: Physical Medicine and Rehabilitation | Admitting: Physical Therapy

## 2016-01-31 DIAGNOSIS — M545 Low back pain, unspecified: Secondary | ICD-10-CM

## 2016-01-31 DIAGNOSIS — R5381 Other malaise: Secondary | ICD-10-CM | POA: Diagnosis not present

## 2016-01-31 NOTE — Therapy (Signed)
[facility] [place], [place], [address] Phone: [phone]   Fax:  [phone]  Physical Therapy Treatment  Patient Details  Name: Veronica Garza MRN: [id number] Date of Birth: 04-20-1941 Referring Provider: Suella Broad MD.  Encounter Date: 01/31/2016      PT End of Session - 01/31/16 1455    Visit Number 16   Number of Visits 24   Date for PT Re-Evaluation 03/10/16   PT Start Time 1442   PT Stop Time 1524   PT Time Calculation (min) 42 min   Activity Tolerance Patient tolerated treatment well   Behavior During Therapy [facility] for tasks assessed/performed      Past Medical History  Diagnosis Date  . Hypertension   . Anemia   . OSA (obstructive sleep apnea)   . GERD (gastroesophageal reflux disease)   . Arthritis   . Urinary incontinence   . Pneumonia     History of  . Cataract   . Hx of shoulder replacement     lt    Past Surgical History  Procedure Laterality Date  . Abdominal hysterectomy    . Vaginal sling    . Nasal sinus surgery    . Shoulder arthroscopy      Left  . Eye surgery      bilateral cataract extraction  . Upper gastrointestinal endoscopy    . Total shoulder arthroplasty  10/10/2011    Procedure: TOTAL SHOULDER ARTHROPLASTY;  Surgeon: Metta Clines Supple;  Location: [place];  Service: Orthopedics;  Laterality: Right;    There were no vitals filed for this visit.  Visit Diagnosis:  Right-sided low back pain without sciatica  Debility      Subjective Assessment - 01/31/16 1445    Subjective some soreness in back today after walking in [employer] for over an hour   Limitations Sitting;Standing   How long can you sit comfortably? 60 minutes   How long can you stand comfortably? 30 min   How long can you walk comfortably? able to walk around [employer]   Diagnostic tests CT scan of lumbar scan.   Currently in Pain? Yes   Pain Score 4    Pain Location Back   Pain Orientation Right;Lower   Pain Descriptors / Indicators Sore   Pain Type Acute pain   Pain Onset More than a month ago   Pain Frequency Intermittent   Aggravating Factors  prolong siting or standing   Pain Relieving Factors rest                         [facility] Adult PT Treatment/Exercise - 01/31/16 0001    Moist Heat Therapy   Number Minutes Moist Heat 15 Minutes   Moist Heat Location Lumbar Spine   Electrical Stimulation   Electrical Stimulation Location R low back    Electrical Stimulation Action premod   Electrical Stimulation Parameters 80-150hz    Electrical Stimulation Goals Pain;Tone   Ultrasound   Ultrasound Location rt lumbar   Ultrasound Parameters 1.5w/cm2/50%/74mhz x53min   Ultrasound Goals Pain   Manual Therapy   Manual Therapy Myofascial release   Myofascial Release MFR/TPR to R lumbar paraspinals, QL, upper glute to decrease tightness and pain in L sidelying                  PT Short Term Goals - 01/11/16 1319    PT SHORT TERM GOAL #1   Title Ind with a HEP.  Time 2   Period Weeks   Status Achieved           PT Long Term Goals - 01/11/16 1319    PT LONG TERM GOAL #1   Title Sit 30 minutes with pain not > 2-3/10.   Baseline reports 4-5/10   Time 4   Period Weeks   Status Achieved   PT LONG TERM GOAL #2   Title Stand 20 minutes with pain not > 3/10.   Baseline a little more than a 3 per patient   Time 4   Period Weeks   Status On-going   PT LONG TERM GOAL #3   Title Walk a community distance wiht pain not > 3/10.   Baseline 4-5/10   Time 4   Period Weeks   Status On-going   PT LONG TERM GOAL #4   Title Perform ADL's with pain not > 3/10.   Baseline 5/10   Time 4   Period Weeks   Status On-going               Plan - 01/31/16 1457    Clinical Impression Statement Patient progressing slowly today after incresed soreness from walking over an hour at [place]. patient reports imrprovement overall yet increases with sitting or standing  too long. patient unable to meet any further goals due to pain deficit. patient had antalgic gait today.   Pt will benefit from skilled therapeutic intervention in order to improve on the following deficits Pain;Decreased activity tolerance   Rehab Potential Excellent   PT Frequency 3x / week   PT Duration 4 weeks   PT Treatment/Interventions ADLs/Self Care Home Management;Electrical Stimulation;Moist Heat;Therapeutic exercise;Therapeutic activities;Ultrasound;Patient/family education;Manual techniques   PT Next Visit Plan continue deep STW to gluts, add stretching to same; continue to progress core. Modalities prn.   Consulted and Agree with Plan of Care Patient        Problem List Patient Active Problem List   Diagnosis Date Noted  . DDD (degenerative disc disease), lumbar 11/06/2015  . Cervical lymphadenopathy 10/18/2015  . Cervical adenopathy 10/17/2015  . Right-sided low back pain without sciatica 06/21/2015  . Diastolic dysfunction without heart failure 11/13/2014  . Other abnormal glucose 05/27/2013  . Osteopenia of the elderly 05/27/2013  . Other and unspecified hyperlipidemia 09/04/2012  . Esophageal stricture 09/12/2011  . Other screening mammogram 08/08/2011  . Routine general medical examination at a health care facility 08/08/2011  . Essential hypertension, benign 01/14/2011  . URINARY INCONTINENCE 12/14/2008  . OA (osteoarthritis) 07/21/2008  . Obstructive sleep apnea 10/14/2007  . GERD 10/14/2007    Zebulun Deman P, PTA 01/31/2016, 3:11 PM  [facility] [address] [place], [place], [address] Phone: [phone]   Fax:  [phone]  Name: Veronica Garza MRN: [id number] Date of Birth: 05-Feb-1941

## 2016-02-01 ENCOUNTER — Ambulatory Visit: Payer: Medicare Other | Admitting: Physical Therapy

## 2016-02-01 DIAGNOSIS — M545 Low back pain, unspecified: Secondary | ICD-10-CM

## 2016-02-01 DIAGNOSIS — R5381 Other malaise: Secondary | ICD-10-CM | POA: Diagnosis not present

## 2016-02-01 NOTE — Therapy (Signed)
[facility] [place], [place], [address] Phone: [phone]   Fax:  [phone]  Physical Therapy Treatment  Patient Details  Name: Veronica Garza MRN: [id number] Date of Birth: 21-Jun-1941 Referring Provider: Suella Broad MD.  Encounter Date: 02/01/2016      PT End of Session - 02/01/16 1121    Visit Number 17   Number of Visits 24   Date for PT Re-Evaluation 03/10/16   PT Start Time 1119   PT Stop Time 1215   PT Time Calculation (min) 56 min   Activity Tolerance Patient tolerated treatment well   Behavior During Therapy [facility] for tasks assessed/performed      Past Medical History  Diagnosis Date  . Hypertension   . Anemia   . OSA (obstructive sleep apnea)   . GERD (gastroesophageal reflux disease)   . Arthritis   . Urinary incontinence   . Pneumonia     History of  . Cataract   . Hx of shoulder replacement     lt    Past Surgical History  Procedure Laterality Date  . Abdominal hysterectomy    . Vaginal sling    . Nasal sinus surgery    . Shoulder arthroscopy      Left  . Eye surgery      bilateral cataract extraction  . Upper gastrointestinal endoscopy    . Total shoulder arthroplasty  10/10/2011    Procedure: TOTAL SHOULDER ARTHROPLASTY;  Surgeon: Metta Clines Supple;  Location: [place];  Service: Orthopedics;  Laterality: Right;    There were no vitals filed for this visit.  Visit Diagnosis:  Right-sided low back pain without sciatica  Debility      Subjective Assessment - 02/01/16 1124    Subjective Walking is better than standing, but patient states she is very slow. Her endurance is better with standing and walking but intensity is still up to 5/10.   Currently in Pain? Yes   Pain Score 4                          [facility] Adult PT Treatment/Exercise - 02/01/16 0001    Modalities   Modalities Electrical Stimulation;Moist Heat;Ultrasound   Moist Heat Therapy   Number Minutes  Moist Heat 15 Minutes   Moist Heat Location Lumbar Spine   Electrical Stimulation   Electrical Stimulation Location R low back and R piriformis   Electrical Stimulation Action premod   Electrical Stimulation Parameters 80-150 Hz to tolerance x 15 min   Electrical Stimulation Goals Pain;Tone   Ultrasound   Ultrasound Location R gluteals/lumbar   Ultrasound Parameters 1.5 wcm2 1 mhz cont x 10 min   Ultrasound Goals Pain   Manual Therapy   Manual Therapy Soft tissue mobilization;Myofascial release   Soft tissue mobilization deep to R piriformis and gluteals   Myofascial Release TP release to R gluteals and piriformis                  PT Short Term Goals - 01/11/16 1319    PT SHORT TERM GOAL #1   Title Ind with a HEP.   Time 2   Period Weeks   Status Achieved           PT Long Term Goals - 02/01/16 1121    PT LONG TERM GOAL #1   Title Sit 30 minutes with pain not > 2-3/10.   Baseline reports 4-5/10   Time 4  Period Weeks   Status Achieved   PT LONG TERM GOAL #2   Title Stand 20 minutes with pain not > 3/10.   Baseline able to stand for an hour now but pain up to 5/10.   Time 4   Period Weeks   Status Achieved   PT LONG TERM GOAL #3   Title Walk a community distance wiht pain not > 3/10.   Time 4   Period Weeks   Status Achieved   PT LONG TERM GOAL #4   Title Perform ADL's with pain not > 3/10.   Time 4   Period Weeks   Status On-going               Plan - 02/01/16 1225    Clinical Impression Statement Patient is progressing with LTGs meeting standing and walking goals. Patient still has to lie down after standing to perform ADLS such as cooking and while endurance with standing and walking has improved, intensity of pain is still 5/10 at end. PT discussed dry needling with patient  which may be beneficial to her TPs in the gluteals and piriformis.    PT Next Visit Plan continue deep STW to gluts, add stretching to same; continue to progress  core. Modalities prn. Goals are ongoing.        Problem List Patient Active Problem List   Diagnosis Date Noted  . DDD (degenerative disc disease), lumbar 11/06/2015  . Cervical lymphadenopathy 10/18/2015  . Cervical adenopathy 10/17/2015  . Right-sided low back pain without sciatica 06/21/2015  . Diastolic dysfunction without heart failure 11/13/2014  . Other abnormal glucose 05/27/2013  . Osteopenia of the elderly 05/27/2013  . Other and unspecified hyperlipidemia 09/04/2012  . Esophageal stricture 09/12/2011  . Other screening mammogram 08/08/2011  . Routine general medical examination at a health care facility 08/08/2011  . Essential hypertension, benign 01/14/2011  . URINARY INCONTINENCE 12/14/2008  . OA (osteoarthritis) 07/21/2008  . Obstructive sleep apnea 10/14/2007  . GERD 10/14/2007    Veronica Garza PT  02/01/2016, 12:31 PM  [facility] [address] [place], [place], [address] Phone: [phone]   Fax:  [phone]  Name: Veronica Garza MRN: [id number] Date of Birth: 11-17-1941

## 2016-02-06 ENCOUNTER — Ambulatory Visit: Payer: Medicare Other | Admitting: Physical Therapy

## 2016-02-06 DIAGNOSIS — M545 Low back pain, unspecified: Secondary | ICD-10-CM

## 2016-02-06 DIAGNOSIS — R5381 Other malaise: Secondary | ICD-10-CM

## 2016-02-06 NOTE — Therapy (Signed)
[facility] [place], [place], [address] Phone: [phone]   Fax:  [phone]  Physical Therapy Treatment  Patient Details  Name: Veronica Garza MRN: [id number] Date of Birth: [birth year] Referring Provider: Suella Broad MD.  Encounter Date: 02/06/2016      PT End of Session - 02/06/16 1119    Visit Number 18   Number of Visits 24   Date for PT Re-Evaluation 03/10/16   PT Start Time 1121   PT Stop Time 1207   PT Time Calculation (min) 46 min   Activity Tolerance Patient tolerated treatment well   Behavior During Therapy [facility] for tasks assessed/performed      Past Medical History  Diagnosis Date  . Hypertension   . Anemia   . OSA (obstructive sleep apnea)   . GERD (gastroesophageal reflux disease)   . Arthritis   . Urinary incontinence   . Pneumonia     History of  . Cataract   . Hx of shoulder replacement     lt    Past Surgical History  Procedure Laterality Date  . Abdominal hysterectomy    . Vaginal sling    . Nasal sinus surgery    . Shoulder arthroscopy      Left  . Eye surgery      bilateral cataract extraction  . Upper gastrointestinal endoscopy    . Total shoulder arthroplasty  10/10/2011    Procedure: TOTAL SHOULDER ARTHROPLASTY;  Surgeon: Metta Clines Supple;  Location: [facility];  Service: Orthopedics;  Laterality: Right;    There were no vitals filed for this visit.  Visit Diagnosis:  Right-sided low back pain without sciatica  Debility      Subjective Assessment - 02/06/16 1118    Subjective States that she has been hurting more since previous treatment and stated that previous manual therapy pressure was too much. States that pain was enough that she took the pain medication that had been prescribed to her.   Limitations Sitting;Standing   How long can you sit comfortably? 60 minutes   How long can you stand comfortably? 30 min   How long can you walk comfortably? able to walk around  [employer]   Diagnostic tests CT scan of lumbar scan.   Currently in Pain? Yes   Pain Score 5    Pain Location Back   Pain Orientation Right;Lower   Pain Descriptors / Indicators Sore   Pain Type Acute pain   Pain Onset More than a month ago            [facility] PT Assessment - 02/06/16 0001    Assessment   Medical Diagnosis Chronic right sided low back pain without sciatica                     [facility] Adult PT Treatment/Exercise - 02/06/16 0001    Modalities   Modalities Electrical Stimulation;Moist Heat;Ultrasound   Moist Heat Therapy   Number Minutes Moist Heat 15 Minutes   Moist Heat Location Lumbar Spine   Electrical Stimulation   Electrical Stimulation Location R low back   Electrical Stimulation Action Pre-Mod   Electrical Stimulation Parameters 80-150 Hz x15 min   Electrical Stimulation Goals Pain;Tone   Ultrasound   Ultrasound Location R lumbar paraspinals/ Upper Glute   Ultrasound Parameters 1.5 w/cm2, 100%,1 mhz x10 min   Ultrasound Goals Pain   Manual Therapy   Manual Therapy Myofascial release   Myofascial Release MFR/TPR to R lumbar  paraspinals/ QL/ Upper Glute to decrease pain and tightness in L sidelying                  PT Short Term Goals - 01/11/16 1319    PT SHORT TERM GOAL #1   Title Ind with a HEP.   Time 2   Period Weeks   Status Achieved           PT Long Term Goals - 02/01/16 1121    PT LONG TERM GOAL #1   Title Sit 30 minutes with pain not > 2-3/10.   Baseline reports 4-5/10   Time 4   Period Weeks   Status Achieved   PT LONG TERM GOAL #2   Title Stand 20 minutes with pain not > 3/10.   Baseline able to stand for an hour now but pain up to 5/10.   Time 4   Period Weeks   Status Achieved   PT LONG TERM GOAL #3   Title Walk a community distance wiht pain not > 3/10.   Time 4   Period Weeks   Status Achieved   PT LONG TERM GOAL #4   Title Perform ADL's with pain not > 3/10.   Time 4   Period Weeks   Status  On-going               Plan - 02/06/16 1157    Clinical Impression Statement Patient tolerated today's treatment fairly well although she arrived at clinic with increased pain since previous treatment. Patient presented in clinic with increased tightness in R lumbar paraspinals and into QL that descended down into Upper Glute region. Patient verbalized soreness and tenderness to palpation with manual therapy to R lumbar paraspinals and Upper Glute. Noted decrease in tightness was noted with palpation following manual therapy today in treated musculature. Normal modalites response noted following removal of the modalities. Remaining goal on-going at this time secondary to pain experienced by patient with ADLs per patient report.  Patient experienced decreased pain following today's treatment.   Pt will benefit from skilled therapeutic intervention in order to improve on the following deficits Pain;Decreased activity tolerance   Rehab Potential Excellent   PT Frequency 3x / week   PT Duration 4 weeks   PT Treatment/Interventions ADLs/Self Care Home Management;Electrical Stimulation;Moist Heat;Therapeutic exercise;Therapeutic activities;Ultrasound;Patient/family education;Manual techniques   PT Next Visit Plan continue deep STW to gluts, add stretching to same; continue to progress core. Modalities prn.   PT Home Exercise Plan bridging and SDLY ITB stretch off EOB   Consulted and Agree with Plan of Care Patient        Problem List Patient Active Problem List   Diagnosis Date Noted  . DDD (degenerative disc disease), lumbar 11/06/2015  . Cervical lymphadenopathy 10/18/2015  . Cervical adenopathy 10/17/2015  . Right-sided low back pain without sciatica 06/21/2015  . Diastolic dysfunction without heart failure 11/13/2014  . Other abnormal glucose 05/27/2013  . Osteopenia of the elderly 05/27/2013  . Other and unspecified hyperlipidemia 09/04/2012  . Esophageal stricture 09/12/2011  .  Other screening mammogram 08/08/2011  . Routine general medical examination at a health care facility 08/08/2011  . Essential hypertension, benign 01/14/2011  . URINARY INCONTINENCE 12/14/2008  . OA (osteoarthritis) 07/21/2008  . Obstructive sleep apnea 10/14/2007  . GERD 10/14/2007    Wynelle Fanny, PTA 02/06/2016, 12:19 PM  [facility] [address] [place], [place], [address] Phone: [phone]   Fax:  [phone]  Name: Veronica Garza MRN: [id number] Date of Birth: [birth year]

## 2016-02-08 DIAGNOSIS — Z0000 Encounter for general adult medical examination without abnormal findings: Secondary | ICD-10-CM | POA: Diagnosis not present

## 2016-02-08 DIAGNOSIS — N3946 Mixed incontinence: Secondary | ICD-10-CM | POA: Diagnosis not present

## 2016-02-09 ENCOUNTER — Encounter: Payer: Self-pay | Admitting: Physical Therapy

## 2016-02-09 ENCOUNTER — Ambulatory Visit: Payer: Medicare Other | Admitting: Physical Therapy

## 2016-02-09 DIAGNOSIS — M545 Low back pain, unspecified: Secondary | ICD-10-CM

## 2016-02-09 DIAGNOSIS — R5381 Other malaise: Secondary | ICD-10-CM | POA: Diagnosis not present

## 2016-02-09 NOTE — Therapy (Signed)
[facility] [place], [place], [address] Phone: [phone]   Fax:  [phone]  Physical Therapy Treatment  Patient Details  Name: Veronica Garza MRN: [id number] Date of Birth: [birth year] Referring Provider: Suella Broad MD.  Encounter Date: 02/09/2016      PT End of Session - 02/09/16 1148    Visit Number 19   Number of Visits 24   Date for PT Re-Evaluation 03/10/16   PT Start Time 1117   PT Stop Time 1200   PT Time Calculation (min) 43 min   Activity Tolerance Patient tolerated treatment well   Behavior During Therapy [facility] for tasks assessed/performed      Past Medical History  Diagnosis Date  . Hypertension   . Anemia   . OSA (obstructive sleep apnea)   . GERD (gastroesophageal reflux disease)   . Arthritis   . Urinary incontinence   . Pneumonia     History of  . Cataract   . Hx of shoulder replacement     lt    Past Surgical History  Procedure Laterality Date  . Abdominal hysterectomy    . Vaginal sling    . Nasal sinus surgery    . Shoulder arthroscopy      Left  . Eye surgery      bilateral cataract extraction  . Upper gastrointestinal endoscopy    . Total shoulder arthroplasty  10/10/2011    Procedure: TOTAL SHOULDER ARTHROPLASTY;  Surgeon: Metta Clines Supple;  Location: [facility];  Service: Orthopedics;  Laterality: Right;    There were no vitals filed for this visit.  Visit Diagnosis:  Right-sided low back pain without sciatica  Debility      Subjective Assessment - 02/09/16 1147    Subjective States that yesterday she went to [facility] shopping and when she got home she used heat and [employer].   Limitations Sitting;Standing   How long can you sit comfortably? 60 minutes   How long can you stand comfortably? 30 min   How long can you walk comfortably? able to walk around [employer]   Diagnostic tests CT scan of lumbar scan.   Currently in Pain? Yes   Pain Score 4    Pain Location Back   Pain Orientation Right;Lower   Pain Descriptors / Indicators Sore   Pain Type Acute pain   Pain Onset More than a month ago            [facility] PT Assessment - 02/09/16 0001    Assessment   Medical Diagnosis Chronic right sided low back pain without sciatica                     [facility] Adult PT Treatment/Exercise - 02/09/16 0001    Modalities   Modalities Electrical Stimulation;Moist Heat;Ultrasound   Moist Heat Therapy   Number Minutes Moist Heat 15 Minutes   Moist Heat Location Lumbar Spine   Electrical Stimulation   Electrical Stimulation Location R low back   Electrical Stimulation Action Pre-Mod   Electrical Stimulation Parameters 80-150 hz x15 min   Electrical Stimulation Goals Pain;Tone   Ultrasound   Ultrasound Location R lumbar paraspinals/ Upper Glute   Ultrasound Parameters 1.5 w/cm2, 100%,1 mhz x59min   Ultrasound Goals Pain   Manual Therapy   Manual Therapy Myofascial release   Myofascial Release MFR/TPR to R lumbar paraspinals/ QL/ Upper Glute to decrease pain and tightness in L sidelying  PT Short Term Goals - 01/11/16 1319    PT SHORT TERM GOAL #1   Title Ind with a HEP.   Time 2   Period Weeks   Status Achieved           PT Long Term Goals - 02/01/16 1121    PT LONG TERM GOAL #1   Title Sit 30 minutes with pain not > 2-3/10.   Baseline reports 4-5/10   Time 4   Period Weeks   Status Achieved   PT LONG TERM GOAL #2   Title Stand 20 minutes with pain not > 3/10.   Baseline able to stand for an hour now but pain up to 5/10.   Time 4   Period Weeks   Status Achieved   PT LONG TERM GOAL #3   Title Walk a community distance wiht pain not > 3/10.   Time 4   Period Weeks   Status Achieved   PT LONG TERM GOAL #4   Title Perform ADL's with pain not > 3/10.   Time 4   Period Weeks   Status On-going               Plan - 02/09/16 1210    Clinical Impression Statement Patient tolerated today's  treatment well although she had R low back soreness today. Minimal- moderate tightness was observed with palpation in R lumbar paraspinals and Upper Glute region today. Patient verbalized 2-3 different times during manual therapy that the area of concentration at that time was a sore area. Tightness that was observed in R low back and upper Glute was decreased by the end of manual therapy. Normal modalties response noted following removal of the modalties. Patient experienced R low back feeling better following treatment and was going to [employer] per patient report.   Pt will benefit from skilled therapeutic intervention in order to improve on the following deficits Pain;Decreased activity tolerance   Rehab Potential Excellent   PT Frequency 3x / week   PT Duration 4 weeks   PT Treatment/Interventions ADLs/Self Care Home Management;Electrical Stimulation;Moist Heat;Therapeutic exercise;Therapeutic activities;Ultrasound;Patient/family education;Manual techniques   PT Next Visit Plan continue deep STW to gluts, add stretching to same; continue to progress core. Modalities prn.   PT Home Exercise Plan bridging and SDLY ITB stretch off EOB   Consulted and Agree with Plan of Care Patient        Problem List Patient Active Problem List   Diagnosis Date Noted  . DDD (degenerative disc disease), lumbar 11/06/2015  . Cervical lymphadenopathy 10/18/2015  . Cervical adenopathy 10/17/2015  . Right-sided low back pain without sciatica 06/21/2015  . Diastolic dysfunction without heart failure 11/13/2014  . Other abnormal glucose 05/27/2013  . Osteopenia of the elderly 05/27/2013  . Other and unspecified hyperlipidemia 09/04/2012  . Esophageal stricture 09/12/2011  . Other screening mammogram 08/08/2011  . Routine general medical examination at a health care facility 08/08/2011  . Essential hypertension, benign 01/14/2011  . URINARY INCONTINENCE 12/14/2008  . OA (osteoarthritis) 07/21/2008  .  Obstructive sleep apnea 10/14/2007  . GERD 10/14/2007    Wynelle Fanny, PTA 02/09/2016, 12:14 PM  [facility] [address] [place], [place], [address] Phone: [phone]   Fax:  [phone]  Name: Veronica Garza MRN: [id number] Date of Birth: 04/16/1941

## 2016-02-13 ENCOUNTER — Encounter: Payer: Medicare Other | Admitting: *Deleted

## 2016-02-13 DIAGNOSIS — D490 Neoplasm of unspecified behavior of digestive system: Secondary | ICD-10-CM | POA: Diagnosis not present

## 2016-02-14 ENCOUNTER — Encounter: Payer: Self-pay | Admitting: Physical Therapy

## 2016-02-14 ENCOUNTER — Ambulatory Visit: Payer: Medicare Other | Admitting: Physical Therapy

## 2016-02-14 DIAGNOSIS — R5381 Other malaise: Secondary | ICD-10-CM | POA: Diagnosis not present

## 2016-02-14 DIAGNOSIS — M545 Low back pain, unspecified: Secondary | ICD-10-CM

## 2016-02-14 NOTE — Therapy (Signed)
[facility] [place], [place], [address] Phone: [phone]   Fax:  [phone]  Physical Therapy Treatment  Patient Details  Name: Veronica Garza MRN: [id number] Date of Birth: [birth year] Referring Provider: Suella Broad MD.  Encounter Date: 02/14/2016      PT End of Session - 02/14/16 1451    Visit Number 20   Number of Visits 24   Date for PT Re-Evaluation 03/10/16   PT Start Time [id number]   PT Stop Time 1432   PT Time Calculation (min) 49 min   Activity Tolerance Patient tolerated treatment well   Behavior During Therapy [facility] for tasks assessed/performed      Past Medical History  Diagnosis Date  . Hypertension   . Anemia   . OSA (obstructive sleep apnea)   . GERD (gastroesophageal reflux disease)   . Arthritis   . Urinary incontinence   . Pneumonia     History of  . Cataract   . Hx of shoulder replacement     lt    Past Surgical History  Procedure Laterality Date  . Abdominal hysterectomy    . Vaginal sling    . Nasal sinus surgery    . Shoulder arthroscopy      Left  . Eye surgery      bilateral cataract extraction  . Upper gastrointestinal endoscopy    . Total shoulder arthroplasty  10/10/2011    Procedure: TOTAL SHOULDER ARTHROPLASTY;  Surgeon: Metta Clines Supple;  Location: [facility];  Service: Orthopedics;  Laterality: Right;    There were no vitals filed for this visit.  Visit Diagnosis:  Right-sided low back pain without sciatica  Debility      Subjective Assessment - 02/14/16 1451    Subjective States that her back is better today and states that she went to [facility] yesterday. Stated that she may go to [place] following today's treatment.   Limitations Sitting;Standing   How long can you sit comfortably? 60 minutes   How long can you stand comfortably? 30 min   How long can you walk comfortably? able to walk around [employer]   Diagnostic tests CT scan of lumbar scan.   Currently in Pain?  Yes   Pain Score 3    Pain Location Back   Pain Orientation Right;Lower   Pain Descriptors / Indicators Sore   Pain Type Acute pain   Pain Onset More than a month ago            [facility] PT Assessment - 02/14/16 0001    Assessment   Medical Diagnosis Chronic right sided low back pain without sciatica                     [facility] Adult PT Treatment/Exercise - 02/14/16 0001    Modalities   Modalities Electrical Stimulation;Moist Heat;Ultrasound   Moist Heat Therapy   Number Minutes Moist Heat 5 Minutes   Moist Heat Location Lumbar Spine   Electrical Stimulation   Electrical Stimulation Location R low back   Electrical Stimulation Action Pre-Mod   Electrical Stimulation Parameters 80-150 Hz x15 min   Electrical Stimulation Goals Pain;Tone   Ultrasound   Ultrasound Location R lumbar paraspinals/ Upper Glute   Ultrasound Parameters 1.5 w/cm2, 100%,1 mhz x10 min   Ultrasound Goals Pain   Manual Therapy   Manual Therapy Myofascial release   Myofascial Release MFR/TPR to R lumbar paraspinals/ QL/ Upper Glute to decrease pain and tightness in L sidelying  PT Short Term Goals - 01/11/16 1319    PT SHORT TERM GOAL #1   Title Ind with a HEP.   Time 2   Period Weeks   Status Achieved           PT Long Term Goals - 02/01/16 1121    PT LONG TERM GOAL #1   Title Sit 30 minutes with pain not > 2-3/10.   Baseline reports 4-5/10   Time 4   Period Weeks   Status Achieved   PT LONG TERM GOAL #2   Title Stand 20 minutes with pain not > 3/10.   Baseline able to stand for an hour now but pain up to 5/10.   Time 4   Period Weeks   Status Achieved   PT LONG TERM GOAL #3   Title Walk a community distance wiht pain not > 3/10.   Time 4   Period Weeks   Status Achieved   PT LONG TERM GOAL #4   Title Perform ADL's with pain not > 3/10.   Time 4   Period Weeks   Status On-going               Plan - 02/14/16 1455    Clinical  Impression Statement Patient tolerated today's treatment very well today and arrived with overall improvement. Minimal increased tightness was noted primarily in R lower lumbar paraspinals and into superior most region of upper Glute region. Patient verablized when a sensitive area was being palpated during manual therapy but reported a "good hurt." Normal modalites response noted following removal of the modalities. Patient experienced low back feeling "good" following today's treatment per patient report and that she may go to [place].   Pt will benefit from skilled therapeutic intervention in order to improve on the following deficits Pain;Decreased activity tolerance   Rehab Potential Excellent   PT Frequency 3x / week   PT Duration 4 weeks   PT Treatment/Interventions ADLs/Self Care Home Management;Electrical Stimulation;Moist Heat;Therapeutic exercise;Therapeutic activities;Ultrasound;Patient/family education;Manual techniques   PT Next Visit Plan continue deep STW to gluts, add stretching to same; continue to progress core. Modalities prn.   PT Home Exercise Plan bridging and SDLY ITB stretch off EOB   Consulted and Agree with Plan of Care Patient        Problem List Patient Active Problem List   Diagnosis Date Noted  . DDD (degenerative disc disease), lumbar 11/06/2015  . Cervical lymphadenopathy 10/18/2015  . Cervical adenopathy 10/17/2015  . Right-sided low back pain without sciatica 06/21/2015  . Diastolic dysfunction without heart failure 11/13/2014  . Other abnormal glucose 05/27/2013  . Osteopenia of the elderly 05/27/2013  . Other and unspecified hyperlipidemia 09/04/2012  . Esophageal stricture 09/12/2011  . Other screening mammogram 08/08/2011  . Routine general medical examination at a health care facility 08/08/2011  . Essential hypertension, benign 01/14/2011  . URINARY INCONTINENCE 12/14/2008  . OA (osteoarthritis) 07/21/2008  . Obstructive sleep apnea 10/14/2007   . GERD 10/14/2007    Veronica Garza, PTA 02/14/2016, 3:00 PM  [facility] [address] [place], [place], [address] Phone: [phone]   Fax:  [phone]  Name: Veronica Garza MRN: [id number] Date of Birth: [birth year]

## 2016-02-15 ENCOUNTER — Encounter: Payer: Self-pay | Admitting: Physical Therapy

## 2016-02-15 ENCOUNTER — Ambulatory Visit: Payer: Medicare Other | Admitting: Physical Therapy

## 2016-02-15 DIAGNOSIS — M545 Low back pain, unspecified: Secondary | ICD-10-CM

## 2016-02-15 DIAGNOSIS — R5381 Other malaise: Secondary | ICD-10-CM | POA: Diagnosis not present

## 2016-02-15 NOTE — Therapy (Signed)
[facility] [address] [place], [place], [address] Phone: [phone]   Fax:  [phone]  Physical Therapy Treatment  Patient Details  Name: Veronica Garza MRN: [id number] Date of Birth: 12/11/1940 Referring Provider: Richard Ramos MD.  Encounter Date: 02/15/2016      PT End of Session - 02/15/16 1353    Visit Number 21   Number of Visits 24   Date for PT Re-Evaluation 03/10/16   PT Start Time 1353   PT Stop Time 1437   PT Time Calculation (min) 44 min   Activity Tolerance Patient tolerated treatment well   Behavior During Therapy WFL for tasks assessed/performed      Past Medical History  Diagnosis Date  . Hypertension   . Anemia   . OSA (obstructive sleep apnea)   . GERD (gastroesophageal reflux disease)   . Arthritis   . Urinary incontinence   . Pneumonia     History of  . Cataract   . Hx of shoulder replacement     lt    Past Surgical History  Procedure Laterality Date  . Abdominal hysterectomy    . Vaginal sling    . Nasal sinus surgery    . Shoulder arthroscopy      Left  . Eye surgery      bilateral cataract extraction  . Upper gastrointestinal endoscopy    . Total shoulder arthroplasty  10/10/2011    Procedure: TOTAL SHOULDER ARTHROPLASTY;  Surgeon: Kevin M Supple;  Location: [facility];  Service: Orthopedics;  Laterality: Right;    There were no vitals filed for this visit.  Visit Diagnosis:  Right-sided low back pain without sciatica  Debility      Subjective Assessment - 02/15/16 1352    Subjective Reports that back is not as good as it was yesterday. Had pain by the time she got home from [employer] yesterday.   Limitations Sitting;Standing   How long can you sit comfortably? 60 minutes   How long can you stand comfortably? 30 min   How long can you walk comfortably? able to walk around [employer]   Diagnostic tests CT scan of lumbar scan.   Currently in Pain? Yes   Pain Score 4    Pain Location  Back   Pain Orientation Right;Lower   Pain Descriptors / Indicators Sore   Pain Type Acute pain   Pain Onset More than a month ago            [facility] PT Assessment - 02/15/16 0001    Assessment   Medical Diagnosis Chronic right sided low back pain without sciatica                     [facility] Adult PT Treatment/Exercise - 02/15/16 0001    Modalities   Modalities Electrical Stimulation;Moist Heat;Ultrasound   Moist Heat Therapy   Number Minutes Moist Heat 15 Minutes   Moist Heat Location Lumbar Spine   Electrical Stimulation   Electrical Stimulation Location R low back   Electrical Stimulation Action Pre-Mod   Electrical Stimulation Parameters 80-150 Hz x15 min   Electrical Stimulation Goals Pain;Tone   Ultrasound   Ultrasound Location R lumbar paraspinals/ Upper Glute   Ultrasound Parameters 1.5 w/cm2 ,100%, 1 mhz x10 min   Ultrasound Goals Pain   Manual Therapy   Manual Therapy Myofascial release   Myofascial Release MFR/TPR to R lumbar paraspinals/ QL/ Upper Glute to decrease pain and tightness in L sidelying                    PT Short Term Goals - 01/11/16 1319    PT SHORT TERM GOAL #1   Title Ind with a HEP.   Time 2   Period Weeks   Status Achieved           PT Long Term Goals - 02/15/16 1434    PT LONG TERM GOAL #1   Title Sit 30 minutes with pain not > 2-3/10.   Baseline reports 4-5/10   Time 4   Period Weeks   Status Achieved   PT LONG TERM GOAL #2   Title Stand 20 minutes with pain not > 3/10.   Baseline able to stand for an hour now but pain up to 5/10.   Time 4   Period Weeks   Status Achieved   PT LONG TERM GOAL #3   Title Walk a community distance wiht pain not > 3/10.   Time 4   Period Weeks   Status Achieved   PT LONG TERM GOAL #4   Title Perform ADL's with pain not > 3/10.   Time 4   Period Weeks   Status Partially Met  Patient states that "for the most part" she can complete ADLs with R LBP less than 3/10.                Plan - 02/15/16 1431    Clinical Impression Statement Patient tolerated today's treatment fairly well as she arrived to treatment with slightly increased R LBP than yesterday. Reported soreness to palpation with manual therapy especially in R Upper Glute today. Minimal to moderate increased tightness was noted in R lumbar paraspinals, QL, upper Glute musculature today. Normal modalities response noted following removal of the modalities. Patient has achieved all LT goals set at evaluation except for the partially met goal of ADLs with pain less than 3/10. Patient still has activities in which are difficult or painful for her per patient report.   Pt will benefit from skilled therapeutic intervention in order to improve on the following deficits Pain;Decreased activity tolerance   Rehab Potential Excellent   PT Frequency 3x / week   PT Duration 4 weeks   PT Treatment/Interventions ADLs/Self Care Home Management;Electrical Stimulation;Moist Heat;Therapeutic exercise;Therapeutic activities;Ultrasound;Patient/family education;Manual techniques   PT Next Visit Plan continue deep STW to gluts, add stretching to same; continue to progress core. Modalities prn.   PT Home Exercise Plan bridging and SDLY ITB stretch off EOB   Consulted and Agree with Plan of Care Patient        Problem List Patient Active Problem List   Diagnosis Date Noted  . DDD (degenerative disc disease), lumbar 11/06/2015  . Cervical lymphadenopathy 10/18/2015  . Cervical adenopathy 10/17/2015  . Right-sided low back pain without sciatica 06/21/2015  . Diastolic dysfunction without heart failure 11/13/2014  . Other abnormal glucose 05/27/2013  . Osteopenia of the elderly 05/27/2013  . Other and unspecified hyperlipidemia 09/04/2012  . Esophageal stricture 09/12/2011  . Other screening mammogram 08/08/2011  . Routine general medical examination at a health care facility 08/08/2011  . Essential  hypertension, benign 01/14/2011  . URINARY INCONTINENCE 12/14/2008  . OA (osteoarthritis) 07/21/2008  . Obstructive sleep apnea 10/14/2007  . GERD 10/14/2007    Veronica Garza, PTA 02/15/2016, 2:48 PM  [facility] [address] [place], [place], [address] Phone: [phone]   Fax:  [phone]  Name: Veronica Garza MRN: [id number] Date of Birth: [birth year]

## 2016-02-20 ENCOUNTER — Ambulatory Visit: Payer: Medicare Other | Admitting: Physical Therapy

## 2016-02-20 DIAGNOSIS — R5381 Other malaise: Secondary | ICD-10-CM | POA: Diagnosis not present

## 2016-02-20 DIAGNOSIS — M545 Low back pain, unspecified: Secondary | ICD-10-CM

## 2016-02-20 NOTE — Therapy (Signed)
[facility] [place], [place], [address] Phone: [phone]   Fax:  [phone]  Physical Therapy Treatment  Patient Details  Name: Veronica Garza MRN: [id number] Date of Birth: 1941-03-10 Referring Provider: Suella Broad MD.  Encounter Date: 02/20/2016      PT End of Session - 02/20/16 1215    Visit Number 22   Number of Visits 24   Date for PT Re-Evaluation 03/10/16   PT Start Time 1119   PT Stop Time 1220   PT Time Calculation (min) 61 min      Past Medical History  Diagnosis Date  . Hypertension   . Anemia   . OSA (obstructive sleep apnea)   . GERD (gastroesophageal reflux disease)   . Arthritis   . Urinary incontinence   . Pneumonia     History of  . Cataract   . Hx of shoulder replacement     lt    Past Surgical History  Procedure Laterality Date  . Abdominal hysterectomy    . Vaginal sling    . Nasal sinus surgery    . Shoulder arthroscopy      Left  . Eye surgery      bilateral cataract extraction  . Upper gastrointestinal endoscopy    . Total shoulder arthroplasty  10/10/2011    Procedure: TOTAL SHOULDER ARTHROPLASTY;  Surgeon: Metta Clines Supple;  Location: [facility];  Service: Orthopedics;  Laterality: Right;    There were no vitals filed for this visit.  Visit Diagnosis:  Right-sided low back pain without sciatica  Debility      Subjective Assessment - 02/20/16 1223    Subjective I am pleased with how well I'm doing.   Limitations Sitting;Standing   How long can you sit comfortably? 60 minutes   How long can you stand comfortably? 30 min   How long can you walk comfortably? able to walk around [employer]   Diagnostic tests CT scan of lumbar scan.   Pain Score 3    Pain Location Back   Pain Orientation Right;Lower   Pain Descriptors / Indicators Sore   Pain Type --  Sub-acute.   Pain Onset More than a month ago                                   PT Short  Term Goals - 01/11/16 1319    PT SHORT TERM GOAL #1   Title Ind with a HEP.   Time 2   Period Weeks   Status Achieved           PT Long Term Goals - 02/15/16 1434    PT LONG TERM GOAL #1   Title Sit 30 minutes with pain not > 2-3/10.   Baseline reports 4-5/10   Time 4   Period Weeks   Status Achieved   PT LONG TERM GOAL #2   Title Stand 20 minutes with pain not > 3/10.   Baseline able to stand for an hour now but pain up to 5/10.   Time 4   Period Weeks   Status Achieved   PT LONG TERM GOAL #3   Title Walk a community distance wiht pain not > 3/10.   Time 4   Period Weeks   Status Achieved   PT LONG TERM GOAL #4   Title Perform ADL's with pain not > 3/10.   Time 4  Period Weeks   Status Partially Met  Patient states that "for the most part" she can complete ADLs with R LBP less than 3/10.               Problem List Patient Active Problem List   Diagnosis Date Noted  . DDD (degenerative disc disease), lumbar 11/06/2015  . Cervical lymphadenopathy 10/18/2015  . Cervical adenopathy 10/17/2015  . Right-sided low back pain without sciatica 06/21/2015  . Diastolic dysfunction without heart failure 11/13/2014  . Other abnormal glucose 05/27/2013  . Osteopenia of the elderly 05/27/2013  . Other and unspecified hyperlipidemia 09/04/2012  . Esophageal stricture 09/12/2011  . Other screening mammogram 08/08/2011  . Routine general medical examination at a health care facility 08/08/2011  . Essential hypertension, benign 01/14/2011  . URINARY INCONTINENCE 12/14/2008  . OA (osteoarthritis) 07/21/2008  . Obstructive sleep apnea 10/14/2007  . GERD 10/14/2007  Treatment:  Left SDLY position while patient received U/S at 1.50 W/CM2 x 12 minutes to patient's right low back musculature f/b HMP and constant pre-mod e'stim at 80-150 HZ x 20 minutes.  Patient felt good after treatment.  Aviana Shevlin, [place] MPT 02/20/2016, 12:29 PM  [facility] [address] [place], [place], [address] Phone: [phone]   Fax:  [phone]  Name: Veronica Garza MRN: [id number] Date of Birth: Dec 30, 1940

## 2016-02-22 ENCOUNTER — Encounter: Payer: Self-pay | Admitting: Physical Therapy

## 2016-02-22 ENCOUNTER — Ambulatory Visit: Payer: Medicare Other | Admitting: Physical Therapy

## 2016-02-22 DIAGNOSIS — R5381 Other malaise: Secondary | ICD-10-CM | POA: Diagnosis not present

## 2016-02-22 DIAGNOSIS — M545 Low back pain, unspecified: Secondary | ICD-10-CM

## 2016-02-22 NOTE — Therapy (Signed)
[facility] [place], [place], [address] Phone: [phone]   Fax:  [phone]  Physical Therapy Treatment  Patient Details  Name: Veronica Garza MRN: [id number] Date of Birth: [birth year] Referring Provider: Suella Broad MD.  Encounter Date: 02/22/2016      PT End of Session - 02/22/16 1348    Visit Number 23   Number of Visits 24   Date for PT Re-Evaluation 03/10/16   PT Start Time 9024   PT Stop Time 1433   PT Time Calculation (min) 45 min   Activity Tolerance Patient tolerated treatment well   Behavior During Therapy [facility] for tasks assessed/performed      Past Medical History  Diagnosis Date  . Hypertension   . Anemia   . OSA (obstructive sleep apnea)   . GERD (gastroesophageal reflux disease)   . Arthritis   . Urinary incontinence   . Pneumonia     History of  . Cataract   . Hx of shoulder replacement     lt    Past Surgical History  Procedure Laterality Date  . Abdominal hysterectomy    . Vaginal sling    . Nasal sinus surgery    . Shoulder arthroscopy      Left  . Eye surgery      bilateral cataract extraction  . Upper gastrointestinal endoscopy    . Total shoulder arthroplasty  10/10/2011    Procedure: TOTAL SHOULDER ARTHROPLASTY;  Surgeon: Metta Clines Supple;  Location: [facility];  Service: Orthopedics;  Laterality: Right;    There were no vitals filed for this visit.  Visit Diagnosis:  Right-sided low back pain without sciatica  Debility      Subjective Assessment - 02/22/16 1347    Subjective States that her back is wonderful but it isn't terrible   Limitations Sitting;Standing   How long can you sit comfortably? 60 minutes   How long can you stand comfortably? 30 min   How long can you walk comfortably? able to walk around [employer]   Diagnostic tests CT scan of lumbar scan.   Currently in Pain? Yes   Pain Score 4    Pain Location Back   Pain Orientation Right;Lower   Pain Descriptors  / Indicators Sore   Pain Onset More than a month ago            [facility] PT Assessment - 02/22/16 0001    Assessment   Medical Diagnosis Chronic right sided low back pain without sciatica                     [facility] Adult PT Treatment/Exercise - 02/22/16 0001    Modalities   Modalities Electrical Stimulation;Moist Heat;Ultrasound   Moist Heat Therapy   Number Minutes Moist Heat 15 Minutes   Moist Heat Location Lumbar Spine   Electrical Stimulation   Electrical Stimulation Location R low back   Electrical Stimulation Action Pre-Mod   Electrical Stimulation Parameters 80-150 Hz x15 min   Electrical Stimulation Goals Pain;Tone   Ultrasound   Ultrasound Location R lumbar paraspinals   Ultrasound Parameters 1.5 w/cm2, 100%,1 mhz x10 min   Ultrasound Goals Pain   Manual Therapy   Manual Therapy Myofascial release   Myofascial Release MFR/TPR to R lumbar paraspinals/ QL/ Upper Glute to decrease pain and tightness in L sidelying                  PT Short Term Goals - 01/11/16  1319    PT SHORT TERM GOAL #1   Title Ind with a HEP.   Time 2   Period Weeks   Status Achieved           PT Long Term Goals - 02/15/16 1434    PT LONG TERM GOAL #1   Title Sit 30 minutes with pain not > 2-3/10.   Baseline reports 4-5/10   Time 4   Period Weeks   Status Achieved   PT LONG TERM GOAL #2   Title Stand 20 minutes with pain not > 3/10.   Baseline able to stand for an hour now but pain up to 5/10.   Time 4   Period Weeks   Status Achieved   PT LONG TERM GOAL #3   Title Walk a community distance wiht pain not > 3/10.   Time 4   Period Weeks   Status Achieved   PT LONG TERM GOAL #4   Title Perform ADL's with pain not > 3/10.   Time 4   Period Weeks   Status Partially Met  Patient states that "for the most part" she can complete ADLs with R LBP less than 3/10.               Plan - 02/22/16 1422    Clinical Impression Statement Patient tolerated  today's treatment well other than verbalization of soreness to palpation to R Glute and Piriformis region. Increased tightness noted in R Glute and Piriformis region today but minimal increased tightness noted in R lumbar paraspinals region. Normal modalites response noted following removal of the modalities. Remaining LT goal is ADLs with pain less than 3/10 at this time. Patient experienced low back feeling "great" following today's treatment.   Pt will benefit from skilled therapeutic intervention in order to improve on the following deficits Pain;Decreased activity tolerance   Rehab Potential Excellent   PT Frequency 3x / week   PT Duration 4 weeks   PT Treatment/Interventions ADLs/Self Care Home Management;Electrical Stimulation;Moist Heat;Therapeutic exercise;Therapeutic activities;Ultrasound;Patient/family education;Manual techniques   PT Next Visit Plan D/C summary required next visit.   PT Home Exercise Plan bridging and SDLY ITB stretch off EOB   Consulted and Agree with Plan of Care Patient        Problem List Patient Active Problem List   Diagnosis Date Noted  . DDD (degenerative disc disease), lumbar 11/06/2015  . Cervical lymphadenopathy 10/18/2015  . Cervical adenopathy 10/17/2015  . Right-sided low back pain without sciatica 06/21/2015  . Diastolic dysfunction without heart failure 11/13/2014  . Other abnormal glucose 05/27/2013  . Osteopenia of the elderly 05/27/2013  . Other and unspecified hyperlipidemia 09/04/2012  . Esophageal stricture 09/12/2011  . Other screening mammogram 08/08/2011  . Routine general medical examination at a health care facility 08/08/2011  . Essential hypertension, benign 01/14/2011  . URINARY INCONTINENCE 12/14/2008  . OA (osteoarthritis) 07/21/2008  . Obstructive sleep apnea 10/14/2007  . GERD 10/14/2007    Wynelle Fanny, PTA 02/22/2016, 2:41 PM  [facility] [address] [place], [place], [address] Phone: [phone]   Fax:  [phone]  Name: Veronica Garza MRN: [id number] Date of Birth: 1941/07/14

## 2016-02-27 ENCOUNTER — Encounter: Payer: Self-pay | Admitting: Physical Therapy

## 2016-02-27 ENCOUNTER — Ambulatory Visit: Payer: Medicare Other | Admitting: Physical Therapy

## 2016-02-27 DIAGNOSIS — M545 Low back pain, unspecified: Secondary | ICD-10-CM

## 2016-02-27 DIAGNOSIS — R5381 Other malaise: Secondary | ICD-10-CM

## 2016-02-27 NOTE — Therapy (Signed)
[facility] [place], [place], [address] Phone: [phone]   Fax:  [phone]  Physical Therapy Treatment  Patient Details  Name: Veronica Garza MRN: [id number] Date of Birth: 01-04-1941 Referring Provider: Suella Broad MD.  Encounter Date: 02/27/2016      PT End of Session - 02/27/16 1211    Visit Number 24   Number of Visits 24   Date for PT Re-Evaluation 03/10/16   PT Start Time 7703   PT Stop Time 1210   PT Time Calculation (min) 44 min   Activity Tolerance Patient tolerated treatment well   Behavior During Therapy [facility] for tasks assessed/performed      Past Medical History  Diagnosis Date  . Hypertension   . Anemia   . OSA (obstructive sleep apnea)   . GERD (gastroesophageal reflux disease)   . Arthritis   . Urinary incontinence   . Pneumonia     History of  . Cataract   . Hx of shoulder replacement     lt    Past Surgical History  Procedure Laterality Date  . Abdominal hysterectomy    . Vaginal sling    . Nasal sinus surgery    . Shoulder arthroscopy      Left  . Eye surgery      bilateral cataract extraction  . Upper gastrointestinal endoscopy    . Total shoulder arthroplasty  10/10/2011    Procedure: TOTAL SHOULDER ARTHROPLASTY;  Surgeon: Metta Clines Supple;  Location: [facility];  Service: Orthopedics;  Laterality: Right;    There were no vitals filed for this visit.  Visit Diagnosis:  Right-sided low back pain without sciatica  Debility      Subjective Assessment - 02/27/16 1118    Subjective Reports that since last treatment her low back has been fair. States that usually in the morning her low back is okay but woke up this morning and her low back was about 3-4/10.   Limitations Sitting;Standing   How long can you sit comfortably? 1.5 hours   How long can you stand comfortably? 1 hour   How long can you walk comfortably? "a couple hours"   Diagnostic tests CT scan of lumbar scan.   Currently in Pain? Yes   Pain Score 4    Pain Location Back   Pain Orientation Right;Lower   Pain Descriptors / Indicators Sore   Pain Onset More than a month ago            [facility] PT Assessment - 02/27/16 0001    Assessment   Medical Diagnosis Chronic right sided low back pain without sciatica                     [facility] Adult PT Treatment/Exercise - 02/27/16 0001    Modalities   Modalities Electrical Stimulation;Moist Heat;Ultrasound   Moist Heat Therapy   Number Minutes Moist Heat 15 Minutes   Moist Heat Location Lumbar Spine   Electrical Stimulation   Electrical Stimulation Location R low back   Electrical Stimulation Action Pre-Mod   Electrical Stimulation Parameters 80-150 Hz x15 min   Electrical Stimulation Goals Pain;Tone   Ultrasound   Ultrasound Location R Upper Glute/ Piriformis   Ultrasound Parameters 1.5 w/cm2, 100%,  22mz x 10 min   Ultrasound Goals Pain   Manual Therapy   Manual Therapy Myofascial release   Myofascial Release MFR/TPR to R lumbar paraspinals/ Upper Glute/ Piriformis to decrease tightness and pain in L  sidelying                  PT Short Term Goals - 01/11/16 1319    PT SHORT TERM GOAL #1   Title Ind with a HEP.   Time 2   Period Weeks   Status Achieved           PT Long Term Goals - 02/15/16 1434    PT LONG TERM GOAL #1   Title Sit 30 minutes with pain not > 2-3/10.   Baseline reports 4-5/10   Time 4   Period Weeks   Status Achieved   PT LONG TERM GOAL #2   Title Stand 20 minutes with pain not > 3/10.   Baseline able to stand for an hour now but pain up to 5/10.   Time 4   Period Weeks   Status Achieved   PT LONG TERM GOAL #3   Title Walk a community distance wiht pain not > 3/10.   Time 4   Period Weeks   Status Achieved   PT LONG TERM GOAL #4   Title Perform ADL's with pain not > 3/10.   Time 4   Period Weeks   Status Partially Met  Patient states that "for the most part" she can complete  ADLs with R LBP less than 3/10.               Plan - 02/27/16 1217    Clinical Impression Statement Patient tolerated today's treatment well although she had reports of increased low back discomfort upon waking this morning which is unusual for her she stated. Increased tightness noted in R Upper Glute and Piriformis region today upon palpation with only intermittant reports from patient regarding palpation of a sore region. Normal modalities response noted following removal of the modalities. Achieved all goals except for ADLs goal as patient requires rest in sitting or supine after a time of doing activities. Patient reports continued compiance with stretches and exercises given in HEP previously. Patient experienced low back feeling "good" following today's treatment.   Pt will benefit from skilled therapeutic intervention in order to improve on the following deficits Pain;Decreased activity tolerance   Rehab Potential Excellent   PT Frequency 3x / week   PT Duration 4 weeks   PT Treatment/Interventions ADLs/Self Care Home Management;Electrical Stimulation;Moist Heat;Therapeutic exercise;Therapeutic activities;Ultrasound;Patient/family education;Manual techniques   PT Next Visit Plan Communicate to MPT of need for D/C summary.   PT Home Exercise Plan bridging and SDLY ITB stretch off EOB   Consulted and Agree with Plan of Care Patient        Problem List Patient Active Problem List   Diagnosis Date Noted  . DDD (degenerative disc disease), lumbar 11/06/2015  . Cervical lymphadenopathy 10/18/2015  . Cervical adenopathy 10/17/2015  . Right-sided low back pain without sciatica 06/21/2015  . Diastolic dysfunction without heart failure 11/13/2014  . Other abnormal glucose 05/27/2013  . Osteopenia of the elderly 05/27/2013  . Other and unspecified hyperlipidemia 09/04/2012  . Esophageal stricture 09/12/2011  . Other screening mammogram 08/08/2011  . Routine general medical  examination at a health care facility 08/08/2011  . Essential hypertension, benign 01/14/2011  . URINARY INCONTINENCE 12/14/2008  . OA (osteoarthritis) 07/21/2008  . Obstructive sleep apnea 10/14/2007  . GERD 10/14/2007    Ahmed Prima, PTA 02/27/2016 12:22 PM  [facility] [address] [place], [place], [address] Phone: [phone]   Fax:  [phone]  Name: Veronica Friedland  Garza MRN: [id number] Date of Birth: [birth year]

## 2016-03-01 NOTE — Therapy (Addendum)
[facility] [place], [place], [address] Phone: [phone]   Fax:  [phone]  Physical Therapy Treatment  Patient Details  Name: Veronica Garza MRN: [id number] Date of Birth: 1941-11-05 Referring Provider: Suella Broad MD.  Encounter Date: 02/27/2016    Past Medical History  Diagnosis Date  . Hypertension   . Anemia   . OSA (obstructive sleep apnea)   . GERD (gastroesophageal reflux disease)   . Arthritis   . Urinary incontinence   . Pneumonia     History of  . Cataract   . Hx of shoulder replacement     lt    Past Surgical History  Procedure Laterality Date  . Abdominal hysterectomy    . Vaginal sling    . Nasal sinus surgery    . Shoulder arthroscopy      Left  . Eye surgery      bilateral cataract extraction  . Upper gastrointestinal endoscopy    . Total shoulder arthroplasty  10/10/2011    Procedure: TOTAL SHOULDER ARTHROPLASTY;  Surgeon: Metta Clines Supple;  Location: [facility];  Service: Orthopedics;  Laterality: Right;    There were no vitals filed for this visit.  Visit Diagnosis:  Right-sided low back pain without sciatica  Debility                                 PT Short Term Goals - 01/11/16 1319    PT SHORT TERM GOAL #1   Title Ind with a HEP.   Time 2   Period Weeks   Status Achieved           PT Long Term Goals - 02/15/16 1434    PT LONG TERM GOAL #1   Title Sit 30 minutes with pain not > 2-3/10.   Baseline reports 4-5/10   Time 4   Period Weeks   Status Achieved   PT LONG TERM GOAL #2   Title Stand 20 minutes with pain not > 3/10.   Baseline able to stand for an hour now but pain up to 5/10.   Time 4   Period Weeks   Status Achieved   PT LONG TERM GOAL #3   Title Walk a community distance wiht pain not > 3/10.   Time 4   Period Weeks   Status Achieved   PT LONG TERM GOAL #4   Title Perform ADL's with pain not > 3/10.   Time 4   Period  Weeks   Status Partially Met  Patient states that "for the most part" she can complete ADLs with R LBP less than 3/10.               Problem List Patient Active Problem List   Diagnosis Date Noted  . DDD (degenerative disc disease), lumbar 11/06/2015  . Cervical lymphadenopathy 10/18/2015  . Cervical adenopathy 10/17/2015  . Right-sided low back pain without sciatica 06/21/2015  . Diastolic dysfunction without heart failure 11/13/2014  . Other abnormal glucose 05/27/2013  . Osteopenia of the elderly 05/27/2013  . Other and unspecified hyperlipidemia 09/04/2012  . Esophageal stricture 09/12/2011  . Other screening mammogram 08/08/2011  . Routine general medical examination at a health care facility 08/08/2011  . Essential hypertension, benign 01/14/2011  . URINARY INCONTINENCE 12/14/2008  . OA (osteoarthritis) 07/21/2008  . Obstructive sleep apnea 10/14/2007  . GERD 10/14/2007  PHYSICAL THERAPY DISCHARGE SUMMARY  Visits  from Start of Care:   Current functional level related to goals / functional outcomes: Please see above.   Remaining deficits: Goal #4 partially met.   Education / Equipment: HEP.  Plan: Patient agrees to discharge.  Patient goals were partially met. Patient is being discharged due to meeting the stated rehab goals.  ?????       Tatiana Courter, [place] MPT 03/01/2016, 5:23 PM  [facility] [address] [place], [place], [address] Phone: [phone]   Fax:  [phone]  Name: Veronica Garza MRN: [id number] Date of Birth: 1941/11/17

## 2016-03-27 ENCOUNTER — Emergency Department (HOSPITAL_COMMUNITY): Payer: Medicare Other

## 2016-03-27 ENCOUNTER — Emergency Department (HOSPITAL_COMMUNITY)
Admission: EM | Admit: 2016-03-27 | Discharge: 2016-03-27 | Disposition: A | Discharge status: Home / Self Care | Payer: Medicare Other | Attending: Emergency Medicine | Admitting: Emergency Medicine

## 2016-03-27 ENCOUNTER — Encounter (HOSPITAL_COMMUNITY): Payer: Self-pay | Admitting: Emergency Medicine

## 2016-03-27 DIAGNOSIS — Z79899 Other long term (current) drug therapy: Secondary | ICD-10-CM | POA: Insufficient documentation

## 2016-03-27 DIAGNOSIS — I10 Essential (primary) hypertension: Secondary | ICD-10-CM | POA: Insufficient documentation

## 2016-03-27 DIAGNOSIS — R0682 Tachypnea, not elsewhere classified: Secondary | ICD-10-CM | POA: Diagnosis not present

## 2016-03-27 DIAGNOSIS — Z862 Personal history of diseases of the blood and blood-forming organs and certain disorders involving the immune mechanism: Secondary | ICD-10-CM | POA: Diagnosis not present

## 2016-03-27 DIAGNOSIS — Z8701 Personal history of pneumonia (recurrent): Secondary | ICD-10-CM | POA: Diagnosis not present

## 2016-03-27 DIAGNOSIS — Z8669 Personal history of other diseases of the nervous system and sense organs: Secondary | ICD-10-CM | POA: Insufficient documentation

## 2016-03-27 DIAGNOSIS — R55 Syncope and collapse: Secondary | ICD-10-CM

## 2016-03-27 DIAGNOSIS — R404 Transient alteration of awareness: Secondary | ICD-10-CM | POA: Diagnosis not present

## 2016-03-27 DIAGNOSIS — R110 Nausea: Secondary | ICD-10-CM | POA: Diagnosis not present

## 2016-03-27 DIAGNOSIS — R531 Weakness: Secondary | ICD-10-CM | POA: Diagnosis not present

## 2016-03-27 LAB — BASIC METABOLIC PANEL
ANION GAP: 12 (ref 5–15)
BUN: 15 mg/dL (ref 6–20)
CALCIUM: 9.7 mg/dL (ref 8.9–10.3)
CHLORIDE: 103 mmol/L (ref 101–111)
CO2: 25 mmol/L (ref 22–32)
CREATININE: 0.75 mg/dL (ref 0.44–1.00)
Glucose, Bld: 114 mg/dL — ABNORMAL HIGH (ref 65–99)
POTASSIUM: 3.6 mmol/L (ref 3.5–5.1)
SODIUM: 140 mmol/L (ref 135–145)

## 2016-03-27 LAB — URINALYSIS, ROUTINE W REFLEX MICROSCOPIC
BILIRUBIN URINE: NEGATIVE
Glucose, UA: NEGATIVE mg/dL
Hgb urine dipstick: NEGATIVE
KETONES UR: 15 mg/dL — AB
Leukocytes, UA: NEGATIVE
NITRITE: NEGATIVE
PH: 6 (ref 5.0–8.0)
PROTEIN: NEGATIVE mg/dL
Specific Gravity, Urine: 1.015 (ref 1.005–1.030)

## 2016-03-27 LAB — CBC
HEMATOCRIT: 37.7 % (ref 36.0–46.0)
Hemoglobin: 13.2 g/dL (ref 12.0–15.0)
MCH: 31.9 pg (ref 26.0–34.0)
MCHC: 35 g/dL (ref 30.0–36.0)
MCV: 91.1 fL (ref 78.0–100.0)
PLATELETS: 196 10*3/uL (ref 150–400)
RBC: 4.14 MIL/uL (ref 3.87–5.11)
RDW: 12.6 % (ref 11.5–15.5)
WBC: 7.1 10*3/uL (ref 4.0–10.5)

## 2016-03-27 LAB — I-STAT TROPONIN, ED: Troponin i, poc: 0 ng/mL (ref 0.00–0.08)

## 2016-03-27 NOTE — ED Notes (Signed)
REMS from [facility] where pt had syncopal event, no fall or trauma, A/O X4, ambulatory and in NAD on arrival

## 2016-03-27 NOTE — ED Notes (Signed)
Pt voided while at CT. Pt infomred that we need a urine sample when she is able to void again.

## 2016-03-27 NOTE — ED Provider Notes (Signed)
CSN: [id number]     Arrival date & time 03/27/16  1729 History   First MD Initiated Contact with Patient 03/27/16 1737     Chief Complaint  Patient presents with  . Loss of Consciousness      HPI REMS from [facility] where pt experienced nausea and then had near syncopal event, no fall or trauma,started after she got nauseated.History of similar symptoms in the past.  Denies chest pain.  Denies headache.  Denies abdominal pain.  Now back to baseline A/O X4, ambulatory and in NAD on arrival Past Medical History  Diagnosis Date  . Hypertension   . Anemia   . OSA (obstructive sleep apnea)   . GERD (gastroesophageal reflux disease)   . Arthritis   . Urinary incontinence   . Pneumonia     History of  . Cataract   . Hx of shoulder replacement     lt   Past Surgical History  Procedure Laterality Date  . Abdominal hysterectomy    . Vaginal sling    . Nasal sinus surgery    . Shoulder arthroscopy      Left  . Eye surgery      bilateral cataract extraction  . Upper gastrointestinal endoscopy    . Total shoulder arthroplasty  10/10/2011    Procedure: TOTAL SHOULDER ARTHROPLASTY;  Surgeon: Metta Clines Supple;  Location: [facility];  Service: Orthopedics;  Laterality: Right;   Family History  Problem Relation Age of Onset  . Cancer Father     Prostate cancer  . Cancer Sister     Ovarian cancer  . Diabetes Sister   . Alzheimer's disease Mother   . COPD Neg Hx   . Alcohol abuse Neg Hx   . Early death Neg Hx   . Heart disease Neg Hx   . Hyperlipidemia Neg Hx   . Hypertension Neg Hx   . Kidney disease Neg Hx   . Stroke Neg Hx    Social History  Substance Use Topics  . Smoking status: Never Smoker   . Smokeless tobacco: Never Used  . Alcohol Use: No   OB History    No data available     Review of Systems  Constitutional: Negative for diaphoresis.  Cardiovascular: Negative for chest pain and leg swelling.  Gastrointestinal: Positive for nausea.  Genitourinary: Negative for  dysuria.  All other systems reviewed and are negative.     Allergies  Iohexol; Naproxen; Neomycin-bacitracin zn-polymyx; and Sulfonamide derivatives  Home Medications   Prior to Admission medications   Medication Sig Start Date End Date Taking? Authorizing Provider  Calcium Carbonate-Vitamin D (CALTRATE 600+D) 600-400 MG-UNIT per tablet Take 1 tablet by mouth 2 (two) times daily.    Yes Historical Provider, MD  Ibuprofen (ADVIL PO) Take 200-400 mg by mouth 2 (two) times daily as needed (FOR PAIN). Take 2 to 4 daily prn   Yes Historical Provider, MD  mirabegron ER (MYRBETRIQ) 50 MG TB24 tablet Take 50 mg by mouth daily.   Yes Historical Provider, MD  Multiple Vitamins-Minerals (CENTRUM SILVER PO) Take 0.5 tablets by mouth 2 (two) times daily.    Yes Historical Provider, MD  Multiple Vitamins-Minerals (PRESERVISION AREDS 2) CAPS Take 1 tablet by mouth 2 (two) times daily.   Yes Historical Provider, MD  Polyethyl Glycol-Propyl Glycol (SYSTANE OP) Apply 1 drop to eye as directed. Into both eyes four times a day for dry eyes   Yes Historical Provider, MD  TURMERIC PO Take 1 tablet by  mouth daily. Take one by mouth daily   Yes Historical Provider, MD  diazepam (VALIUM) 5 MG tablet Take 0.5 tablets (2.5 mg total) by mouth 2 (two) times daily. Patient not taking: Reported on 03/27/2016 11/17/15   Deno Etienne, DO  EEMT HS 0.625-1.25 MG per tablet TAKE 1 TABLET DAILY Patient not taking: Reported on 12/08/2015 09/22/14   Janith Lima, MD  oxyCODONE-acetaminophen (PERCOCET) 7.5-325 MG tablet Take 1 tablet by mouth every 4 (four) hours as needed. Patient not taking: Reported on 03/27/2016 10/31/15   Janith Lima, MD   BP 133/60 mmHg  Pulse 86  Temp(Src) 97.6 F (36.4 C) (Oral)  Resp 12  SpO2 90% Physical Exam  Constitutional: She is oriented to person, place, and time. She appears well-developed and well-nourished. No distress.  HENT:  Head: Normocephalic and atraumatic.  Eyes: Pupils are  equal, round, and reactive to light.  Neck: Normal range of motion.  Cardiovascular: Normal rate and intact distal pulses.  Exam reveals no friction rub.   No murmur heard. Pulmonary/Chest: No respiratory distress. She has no wheezes.  Abdominal: Soft. Normal appearance and bowel sounds are normal. She exhibits no distension. There is no tenderness. There is no rebound.  Musculoskeletal: Normal range of motion.  Neurological: She is alert and oriented to person, place, and time. No cranial nerve deficit.  Skin: Skin is warm and dry. No rash noted.  Psychiatric: She has a normal mood and affect. Her behavior is normal.  Nursing note and vitals reviewed.   ED Course  Procedures (including critical care time) Labs Review Labs Reviewed  BASIC METABOLIC PANEL - Abnormal; Notable for the following:    Glucose, Bld 114 (*)    All other components within normal limits  URINALYSIS, ROUTINE W REFLEX MICROSCOPIC (NOT AT [facility]) - Abnormal; Notable for the following:    Ketones, ur 15 (*)    All other components within normal limits  CBC  I-STAT TROPOININ, ED    Imaging Review Dg Chest 2 View  03/27/2016  CLINICAL DATA:  Syncope x 1 day EXAM: CHEST  2 VIEW COMPARISON:  06/21/2015 FINDINGS: There is no focal parenchymal opacity. There is no pleural effusion or pneumothorax. The heart and mediastinal contours are unremarkable. There are bilateral shoulder arthroplasties. IMPRESSION: No active cardiopulmonary disease. Electronically Signed   By: Kathreen Devoid   On: 03/27/2016 18:31   Ct Head Wo Contrast  03/27/2016  CLINICAL DATA:  Patient with syncopal episode.  Shortness of breath. EXAM: CT HEAD WITHOUT CONTRAST TECHNIQUE: Contiguous axial images were obtained from the base of the skull through the vertex without intravenous contrast. COMPARISON:  Brain CT 10/25/2014 FINDINGS: Ventricles and sulci are appropriate for patient's age. Bilateral basal ganglia calcifications. No evidence for acute  cortically based infarct, intracranial hemorrhage, mass lesion or mass-effect. Orbits are unremarkable. Paranasal sinuses are well aerated. Mastoid air cells unremarkable. Calvarium is intact. Small amount of fluid within the sphenoid sinus. IMPRESSION: No acute intracranial process. Small amount of fluid within the sphenoid sinus. Electronically Signed   By: Lovey Newcomer M.D.   On: 03/27/2016 19:27   I have personally reviewed and evaluated these images and lab results as part of my medical decision-making.   EKG Interpretation   Date/Time:  Wednesday March 27 2016 17:40:30 EDT Ventricular Rate:  77 PR Interval:  171 QRS Duration: 96 QT Interval:  394 QTC Calculation: 446 R Axis:   -47 Text Interpretation:  Sinus rhythm Atrial premature complex Left anterior  fascicular block Abnormal R-wave progression, late transition Borderline T  wave abnormalities Abnormal ekg Confirmed by Dyland Panuco  MD, Robertine Kipper ([id number]) on  03/27/2016 6:04:40 PM     After treatment in the ED the patient feels back to baseline and wants to go home. MDM   Final diagnoses:  Vaso vagal episode        Leonard Schwartz, MD 03/27/16 2158

## 2016-03-27 NOTE — Discharge Instructions (Signed)
Near-Syncope Near-syncope (commonly known as near fainting) is sudden weakness, dizziness, or feeling like you might pass out. This can happen when getting up or while standing for a long time. It is caused by a sudden decrease in blood flow to the brain, which can occur for various reasons. Most of the reasons are not serious.  HOME CARE Watch your condition for any changes.  Have someone stay with you until you feel stable.  If you feel like you are going to pass out:  Lie down right away.  Prop your feet up if you can.  Breathe deeply and steadily.  Move only when the feeling has gone away. Most of the time, this feeling lasts only a few minutes. You may feel tired for several hours.  Drink enough fluids to keep your pee (urine) clear or pale yellow.  If you are taking blood pressure or heart medicine, stand up slowly.  Follow up with your doctor as told. GET HELP RIGHT AWAY IF:   You have a severe headache.  You have unusual pain in the chest, belly (abdomen), or back.  You have bleeding from the mouth or butt (rectum), or you have black or tarry poop (stool).  You feel your heart beat differently than normal, or you have a very fast pulse.  You pass out, or you twitch and shake when you pass out.  You pass out when sitting or lying down.  You feel confused.  You have trouble walking.  You are weak.  You have vision problems. MAKE SURE YOU:   Understand these instructions.  Will watch your condition.  Will get help right away if you are not doing well or get worse.   This information is not intended to replace advice given to you by your health care provider. Make sure you discuss any questions you have with your health care provider.   Document Released: 05/06/2008 Document Revised: 12/09/2014 Document Reviewed: 04/23/2013 Elsevier Interactive Patient Education 2016 [employer].  Syncope, commonly known as fainting, is a temporary loss of consciousness.  It occurs when the blood flow to the brain is reduced. Vasovagal syncope (also called neurocardiogenic syncope) is a fainting spell in which the blood flow to the brain is reduced because of a sudden drop in heart rate and blood pressure. Vasovagal syncope occurs when the brain and the cardiovascular system (blood vessels) do not adequately communicate and respond to each other. This is the most common cause of fainting. It often occurs in response to fear or some other type of emotional or physical stress. The body has a reaction in which the heart starts beating too slowly or the blood vessels expand, reducing blood pressure. This type of fainting spell is generally considered harmless. However, injuries can occur if a person takes a sudden fall during a fainting spell.  CAUSES  Vasovagal syncope occurs when a person's blood pressure and heart rate decrease suddenly, usually in response to a trigger. Many things and situations can trigger an episode. Some of these include:   Pain.   Fear.   The sight of blood or medical procedures, such as blood being drawn from a vein.   Common activities, such as coughing, swallowing, stretching, or going to the bathroom.   Emotional stress.   Prolonged standing, especially in a warm environment.   Lack of sleep or rest.   Prolonged lack of food.   Prolonged lack of fluids.   Recent illness.  The use of certain drugs that  affect blood pressure, such as cocaine, alcohol, marijuana, inhalants, and opiates.  SYMPTOMS  Before the fainting episode, you may:   Feel dizzy or light headed.   Become pale.  Sense that you are going to faint.   Feel like the room is spinning.   Have tunnel vision, only seeing directly in front of you.   Feel sick to your stomach (nauseous).   See spots or slowly lose vision.   Hear ringing in your ears.   Have a headache.   Feel warm and sweaty.   Feel a sensation of pins and  needles. During the fainting spell, you will generally be unconscious for no longer than a couple minutes before waking up and returning to normal. If you get up too quickly before your body can recover, you may faint again. Some twitching or jerky movements may occur during the fainting spell.  DIAGNOSIS  Your health care provider will ask about your symptoms, take a medical history, and perform a physical exam. Various tests may be done to rule out other causes of fainting. These may include blood tests and tests to check the heart, such as electrocardiography, echocardiography, and possibly an electrophysiology study. When other causes have been ruled out, a test may be done to check the body's response to changes in position (tilt table test). TREATMENT  Most cases of vasovagal syncope do not require treatment. Your health care provider may recommend ways to avoid fainting triggers and may provide home strategies for preventing fainting. If you must be exposed to a possible trigger, you can drink additional fluids to help reduce your chances of having an episode of vasovagal syncope. If you have warning signs of an oncoming episode, you can respond by positioning yourself favorably (lying down). If your fainting spells continue, you may be given medicines to prevent fainting. Some medicines may help make you more resistant to repeated episodes of vasovagal syncope. Special exercises or compression stockings may be recommended. In rare cases, the surgical placement of a pacemaker is considered. HOME CARE INSTRUCTIONS   Learn to identify the warning signs of vasovagal syncope.   Sit or lie down at the first warning sign of a fainting spell. If sitting, put your head down between your legs. If you lie down, swing your legs up in the air to increase blood flow to the brain.   Avoid hot tubs and saunas.  Avoid prolonged standing.  Drink enough fluids to keep your urine clear or pale yellow. Avoid  caffeine.  Increase salt in your diet as directed by your health care provider.   If you have to stand for a long time, perform movements such as:   Crossing your legs.   Flexing and stretching your leg muscles.   Squatting.   Moving your legs.   Bending over.   Only take over-the-counter or prescription medicines as directed by your health care provider. Do not suddenly stop any medicines without asking your health care provider first. [place] IF:   Your fainting spells continue or happen more frequently in spite of treatment.   You lose consciousness for more than a couple minutes.  You have fainting spells during or after exercising or after being startled.   You have new symptoms that occur with the fainting spells, such as:   Shortness of breath.  Chest pain.   Irregular heartbeat.   You have episodes of twitching or jerky movements that last longer than a few seconds.  You have  episodes of twitching or jerky movements without obvious fainting. SEEK IMMEDIATE MEDICAL CARE IF:   You have injuries or bleeding after a fainting spell.   You have episodes of twitching or jerky movements that last longer than 5 minutes.   You have more than one spell of twitching or jerky movements before returning to consciousness after fainting.   This information is not intended to replace advice given to you by your health care provider. Make sure you discuss any questions you have with your health care provider.   Document Released: 11/04/2012 Document Revised: 04/04/2015 Document Reviewed: 11/04/2012 Elsevier Interactive Patient Education [employer].

## 2016-05-24 IMAGING — CT CT ABD-PELV W/O CM
2 of 4 series · 10 of 46 positions shown, 11 images · non-contrast
Comparison: CT abdomen pelvis 10/27/2015

CLINICAL DATA: Right lower back pain 1 month

EXAM:
CT ABDOMEN AND PELVIS WITHOUT CONTRAST
TECHNIQUE: Multidetector CT imaging of the abdomen and pelvis was performed
following the standard protocol without IV contrast.

[Series 201: routine, idose (2) · axial · 0.91mm/px · z∈[+487,+867]mm · 7 of 90 slices shown, 8 images]
[im 7/90  soft-tissue]
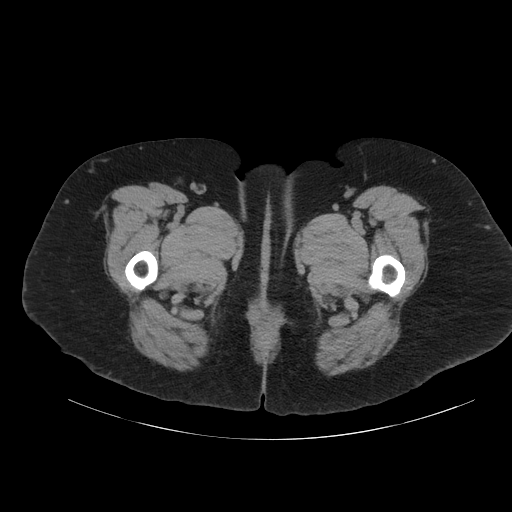
[im 7/90  bone]
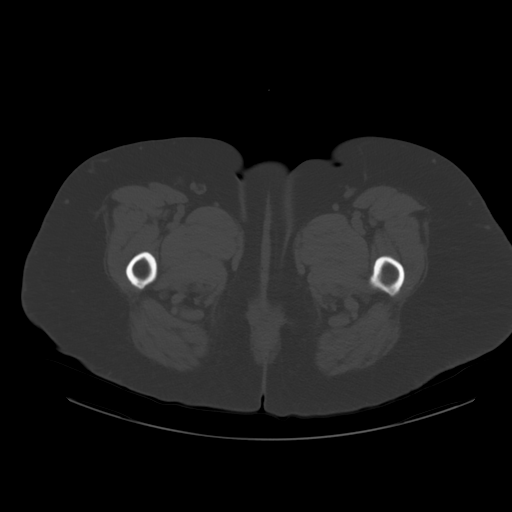
[im 20/90  soft-tissue]
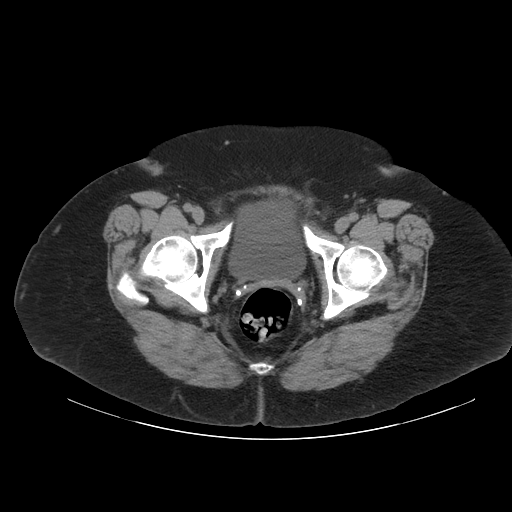
[im 33/90  soft-tissue]
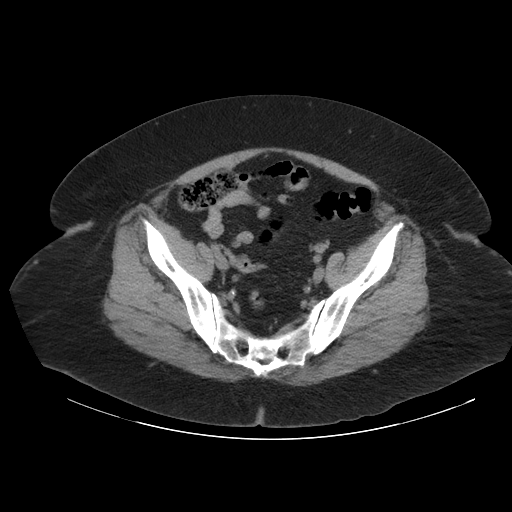
[im 47/90  soft-tissue]
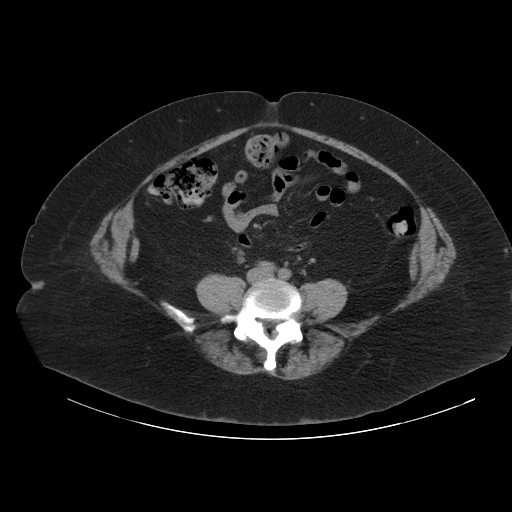
[im 57/90  soft-tissue]
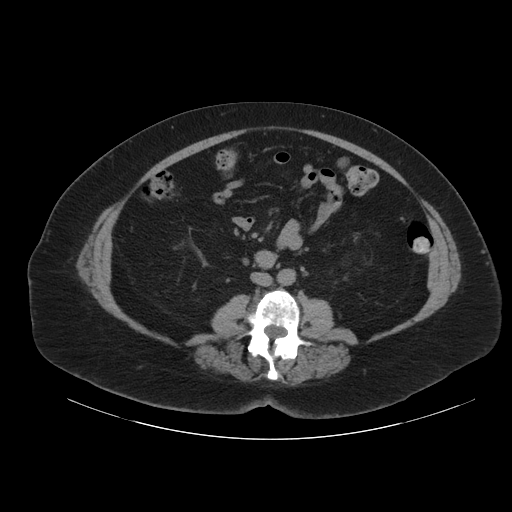
[im 70/90  soft-tissue]
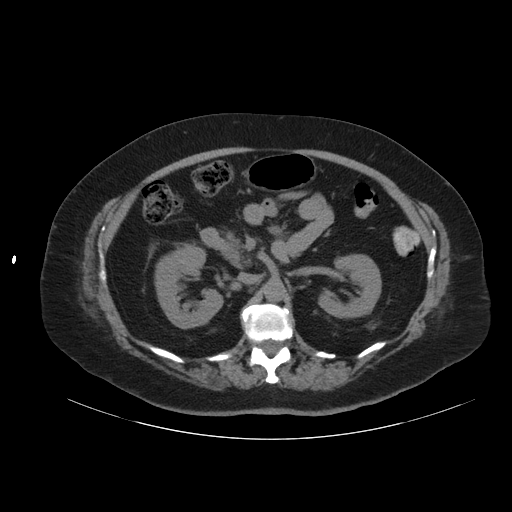
[im 83/90  soft-tissue]
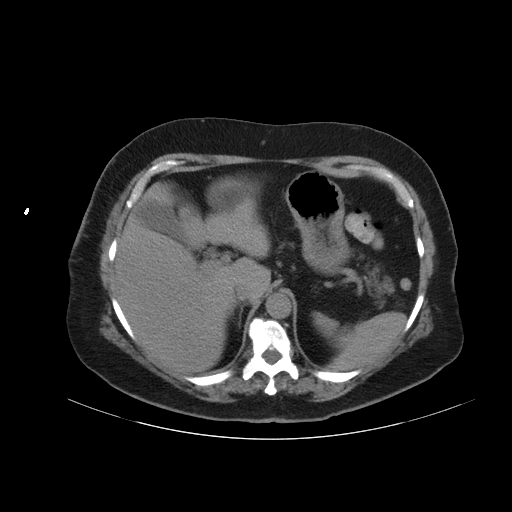

[Series 203: coronals, idose (2) · coronal · 0.45mm/px · 3 of 134 slices shown]
[im 45/134  soft-tissue]
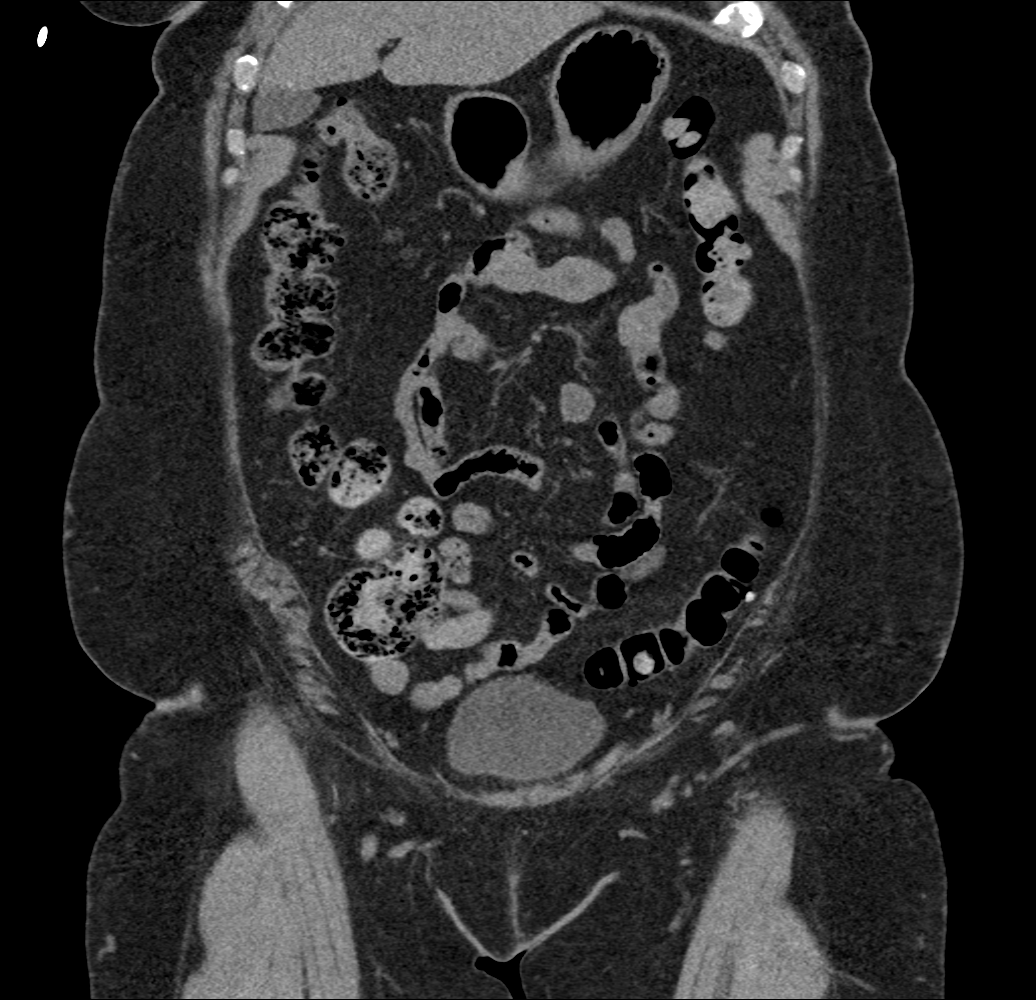
[im 60/134  soft-tissue]
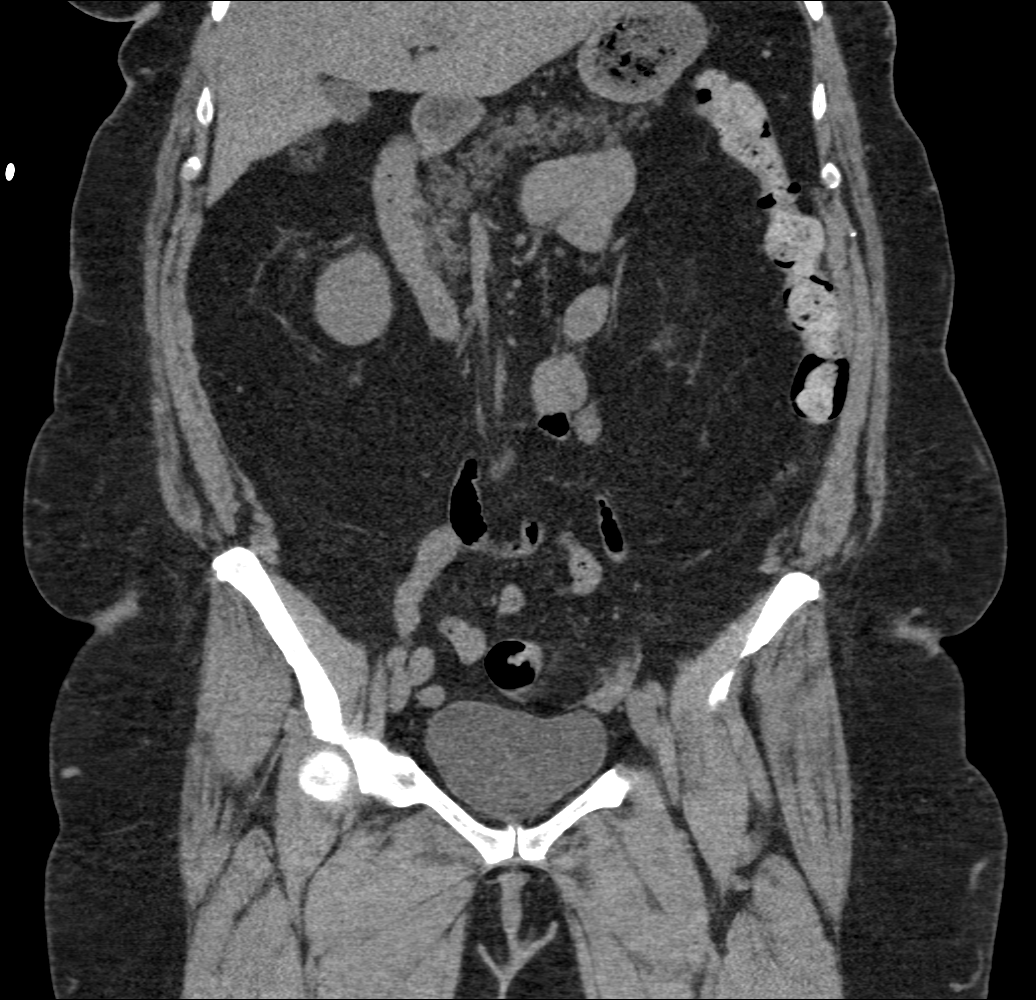
[im 74/134  soft-tissue]
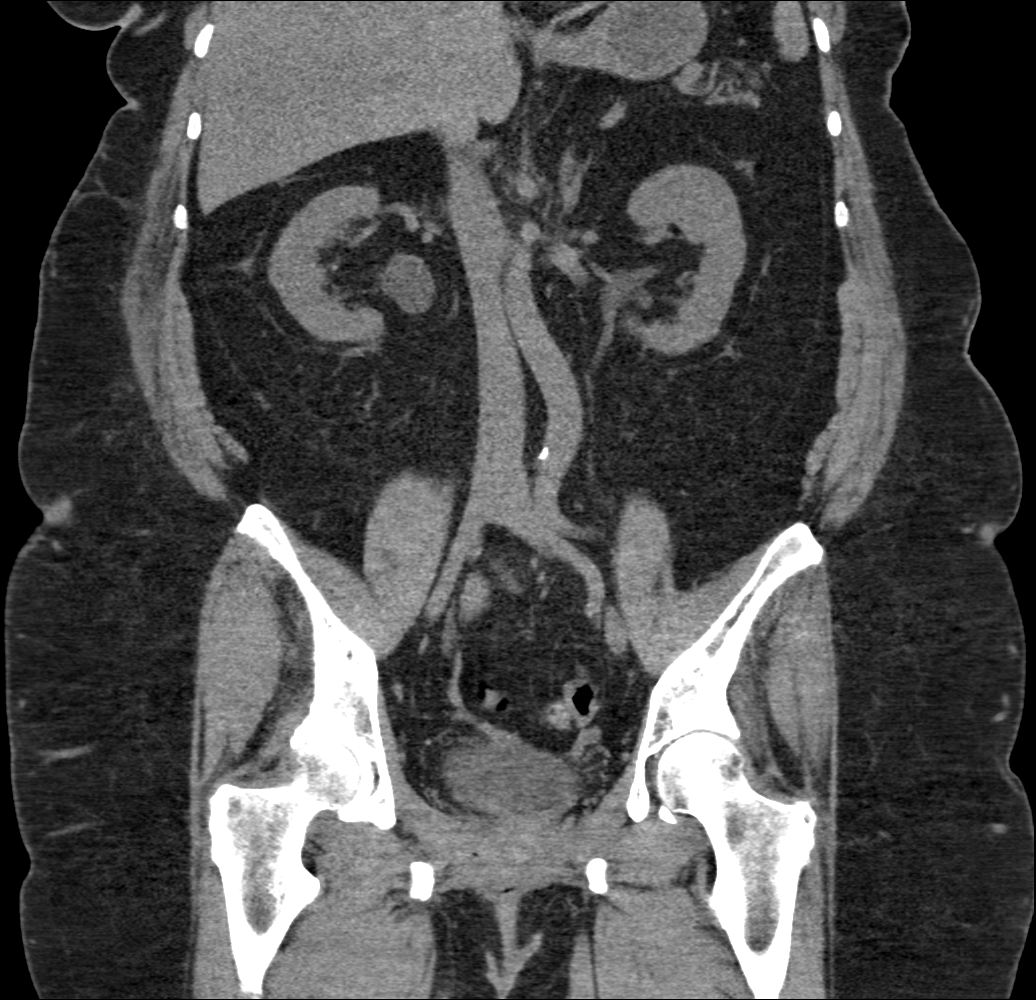

[10 of 46 positions shown; findings below may reference images not displayed]

FINDINGS: .  Lower chest:  Limited imaging of the lung bases is negative.

Hepatobiliary: The upper liver was not included on this study. No
focal liver lesion. Gallbladder and bile ducts normal.

Pancreas: Negative

Spleen: Negative

Adrenals/Urinary Tract: Mild right hydronephrosis and hydroureter.
No obstructing stone. Question recently passed stone. Right renal
collecting system is mildly distended compared to the prior CT. No
left renal calculi. No left renal obstruction. Negative for renal
mass. Urinary bladder normal.

Stomach/Bowel: Negative for bowel obstruction. No bowel mass or
edema.

Vascular/Lymphatic: Negative aorta.  Negative for adenopathy.

Reproductive: Postop hysterectomy.  No pelvic mass.

Other: No free fluid.

Musculoskeletal: Lumbar degenerative changes. See separate CT lumbar
spine report from today.
IMPRESSION: Mild right hydronephrosis without obstructing stone. Question
recently passed stone

Lumbar degenerative changes.  See separate CT lumbar spine report.

## 2016-09-02 ENCOUNTER — Ambulatory Visit (INDEPENDENT_AMBULATORY_CARE_PROVIDER_SITE_OTHER): Payer: Medicare Other

## 2016-09-02 DIAGNOSIS — Z23 Encounter for immunization: Secondary | ICD-10-CM

## 2016-10-14 ENCOUNTER — Encounter: Payer: Self-pay | Admitting: Pulmonary Disease

## 2016-10-17 ENCOUNTER — Encounter: Payer: Self-pay | Admitting: Pulmonary Disease

## 2016-10-17 ENCOUNTER — Ambulatory Visit (INDEPENDENT_AMBULATORY_CARE_PROVIDER_SITE_OTHER): Payer: Medicare Other | Admitting: Pulmonary Disease

## 2016-10-17 VITALS — BP 122/78 | HR 65 | Ht 60.0 in | Wt 210.8 lb

## 2016-10-17 DIAGNOSIS — G4733 Obstructive sleep apnea (adult) (pediatric): Secondary | ICD-10-CM

## 2016-10-17 DIAGNOSIS — I10 Essential (primary) hypertension: Secondary | ICD-10-CM

## 2016-10-17 NOTE — Progress Notes (Signed)
   Subjective:    Patient ID: Veronica Garza, female    DOB: 04/28/1941, 75 y.o.   MRN: [id number]  HPI  75 year old never smoker for FU of obstructive sleep apnea.   She underwent Bilateral ethmoidectomy and maxillary sinus ostial enlargement with antrostomies with reduction of turbinates.by Dr Ernesto Rutherford 2008 and following surgery,  CPAP pressure was decreased to 7 cm.  Her supplier is [facility]    .10/17/2016  Chief Complaint  Patient presents with  . Follow-up    Wears CPAP nightly. Denies problems with mask or pressure. DME: [facility]    Annual follow-up She has been using her CPAP machine and denies any problems with mask or pressure. Her weight has not changed in the last 1 year Her blood pressure is well controlled. She has been able to get her supplies from DME on time Download shows good control of events on CPAP of 7 cm with minimal leak   Significant tests/ events  PSG 2007 >> severe OSA with AHI of 64 events per hour, wt195 pounds >>corrected by nasal CPAP of 13 cm.    Review of Systems Patient denies significant dyspnea,cough, hemoptysis,  chest pain, palpitations, pedal edema, orthopnea, paroxysmal nocturnal dyspnea, lightheadedness, nausea, vomiting, abdominal or  leg pains      Objective:   Physical Exam  Gen. Pleasant, obese, in no distress ENT - no lesions, no post nasal drip Neck: No JVD, no thyromegaly, no carotid bruits Lungs: no use of accessory muscles, no dullness to percussion, decreased without rales or rhonchi  Cardiovascular: Rhythm regular, heart sounds  normal, no murmurs or gallops, no peripheral edema Musculoskeletal: No deformities, no cyanosis or clubbing , no tremors       Assessment & Plan:

## 2016-10-17 NOTE — Patient Instructions (Signed)
Your CPAP is set at 7 cm Supplies will be renewed x  1 year

## 2016-10-18 NOTE — Assessment & Plan Note (Signed)
CPAP is set at 7 cm Supplies will be renewed x  1 year  Weight loss encouraged, compliance with goal of at least 4-6 hrs every night is the expectation. Advised against medications with sedative side effects Cautioned against driving when sleepy - understanding that sleepiness will vary on a day to day basis

## 2016-10-18 NOTE — Assessment & Plan Note (Signed)
Well controlled 

## 2016-11-13 DIAGNOSIS — M180 Bilateral primary osteoarthritis of first carpometacarpal joints: Secondary | ICD-10-CM | POA: Diagnosis not present

## 2016-11-13 DIAGNOSIS — M19042 Primary osteoarthritis, left hand: Secondary | ICD-10-CM | POA: Diagnosis not present

## 2016-11-13 DIAGNOSIS — M19041 Primary osteoarthritis, right hand: Secondary | ICD-10-CM | POA: Diagnosis not present

## 2016-12-03 ENCOUNTER — Other Ambulatory Visit: Payer: Self-pay | Admitting: Dermatology

## 2016-12-03 DIAGNOSIS — L821 Other seborrheic keratosis: Secondary | ICD-10-CM | POA: Diagnosis not present

## 2016-12-03 DIAGNOSIS — D492 Neoplasm of unspecified behavior of bone, soft tissue, and skin: Secondary | ICD-10-CM | POA: Diagnosis not present

## 2016-12-03 DIAGNOSIS — B078 Other viral warts: Secondary | ICD-10-CM | POA: Diagnosis not present

## 2016-12-03 DIAGNOSIS — L820 Inflamed seborrheic keratosis: Secondary | ICD-10-CM | POA: Diagnosis not present

## 2016-12-03 DIAGNOSIS — D229 Melanocytic nevi, unspecified: Secondary | ICD-10-CM | POA: Diagnosis not present

## 2016-12-11 ENCOUNTER — Encounter: Payer: Medicare Other | Admitting: Internal Medicine

## 2016-12-13 DIAGNOSIS — J00 Acute nasopharyngitis [common cold]: Secondary | ICD-10-CM | POA: Diagnosis not present

## 2016-12-13 DIAGNOSIS — J40 Bronchitis, not specified as acute or chronic: Secondary | ICD-10-CM | POA: Diagnosis not present

## 2016-12-13 DIAGNOSIS — R05 Cough: Secondary | ICD-10-CM | POA: Diagnosis not present

## 2016-12-17 ENCOUNTER — Ambulatory Visit (INDEPENDENT_AMBULATORY_CARE_PROVIDER_SITE_OTHER): Payer: Medicare Other | Admitting: Nurse Practitioner

## 2016-12-17 ENCOUNTER — Encounter: Payer: Self-pay | Admitting: Nurse Practitioner

## 2016-12-17 VITALS — BP 142/78 | HR 71 | Temp 97.9°F | Ht 60.0 in | Wt 209.0 lb

## 2016-12-17 DIAGNOSIS — J209 Acute bronchitis, unspecified: Secondary | ICD-10-CM | POA: Diagnosis not present

## 2016-12-17 DIAGNOSIS — J0140 Acute pansinusitis, unspecified: Secondary | ICD-10-CM

## 2016-12-17 MED ORDER — DM-GUAIFENESIN ER 30-600 MG PO TB12: 1.0000 | ORAL_TABLET | Freq: Two times a day (BID) | ORAL | 0 refills | Status: DC | PRN

## 2016-12-17 MED ORDER — ALBUTEROL SULFATE HFA 108 (90 BASE) MCG/ACT IN AERS: 2.0000 | INHALATION_SPRAY | Freq: Four times a day (QID) | RESPIRATORY_TRACT | 0 refills | Status: DC | PRN

## 2016-12-17 MED ORDER — AZITHROMYCIN 500 MG PO TABS: 500.0000 mg | ORAL_TABLET | Freq: Every day | ORAL | 0 refills | Status: DC

## 2016-12-17 MED ORDER — AZITHROMYCIN 250 MG PO TABS: 250.0000 mg | ORAL_TABLET | Freq: Every day | ORAL | 0 refills | Status: DC

## 2016-12-17 MED ORDER — IPRATROPIUM-ALBUTEROL 0.5-2.5 (3) MG/3ML IN SOLN
3.0000 mL | Freq: Once | RESPIRATORY_TRACT | Status: AC
  Administered 2016-12-17: 3 mL via RESPIRATORY_TRACT

## 2016-12-17 MED ORDER — METHYLPREDNISOLONE ACETATE 40 MG/ML IJ SUSP
40.0000 mg | Freq: Once | INTRAMUSCULAR | Status: AC
  Administered 2016-12-17: 40 mg via INTRAMUSCULAR

## 2016-12-17 MED ORDER — HYDROCODONE-HOMATROPINE 5-1.5 MG/5ML PO SYRP: 5.0000 mL | ORAL_SOLUTION | Freq: Every evening | ORAL | 0 refills | Status: DC | PRN

## 2016-12-17 NOTE — Patient Instructions (Addendum)
Encourage adequate oral hydration.  Return to office if no improvement in 1week.  Use ventolin inhaler 2-3times a day for 2days, then as needed for cough, wheezing and SOB.

## 2016-12-17 NOTE — Progress Notes (Signed)
Subjective:  Patient ID: Veronica Garza, female    DOB: 1941-07-30  Age: 76 y.o. MRN: [id number]  CC: Cough (coughing up brown/yellow mucus at times,head to breath--went to urgent care 5 days ago,abx given )   Cough  This is a new problem. The current episode started in the past 7 days. The problem has been gradually worsening. The cough is productive of purulent sputum. Associated symptoms include chills, headaches, nasal congestion, postnasal drip, rhinorrhea, a sore throat, shortness of breath and wheezing. Pertinent negatives include no chest pain, fever or heartburn. The symptoms are aggravated by cold air and lying down. She has tried cool air, OTC cough suppressant, prescription cough suppressant and rest (amoxicillin 875mg , benzonatate and atrovent nasal spray) for the symptoms. The treatment provided no relief. Her past medical history is significant for bronchitis.    Outpatient Medications Prior to Visit  Medication Sig Dispense Refill  . Calcium Carbonate-Vitamin D (CALTRATE 600+D) 600-400 MG-UNIT per tablet Take 1 tablet by mouth 2 (two) times daily.     . Ibuprofen (ADVIL PO) Take 200-400 mg by mouth 2 (two) times daily as needed (FOR PAIN). Take 2 to 4 daily prn    . mirabegron ER (MYRBETRIQ) 50 MG TB24 tablet Take 50 mg by mouth daily.    . Multiple Vitamins-Minerals (CENTRUM SILVER PO) Take 0.5 tablets by mouth 2 (two) times daily.     . Multiple Vitamins-Minerals (PRESERVISION AREDS 2) CAPS Take 1 tablet by mouth 2 (two) times daily.    Vladimir Faster Glycol-Propyl Glycol (SYSTANE OP) Apply 1 drop to eye as directed. Into both eyes four times a day for dry eyes    . TURMERIC PO Take 1 tablet by mouth daily. Take one by mouth daily     No facility-administered medications prior to visit.     ROS See HPI  Objective:  BP (!) 142/78   Pulse 71   Temp 97.9 F (36.6 C)   Ht 5' (1.524 m)   Wt 209 lb (94.8 kg)   SpO2 97%   BMI 40.82 kg/m   BP Readings from Last 3  Encounters:  12/17/16 (!) 142/78  10/17/16 122/78  03/27/16 144/78    Wt Readings from Last 3 Encounters:  12/17/16 209 lb (94.8 kg)  10/17/16 210 lb 12.8 oz (95.6 kg)  11/17/15 204 lb (92.5 kg)    Physical Exam  Constitutional: She is oriented to person, place, and time.  HENT:  Right Ear: Tympanic membrane, external ear and ear canal normal.  Left Ear: Tympanic membrane, external ear and ear canal normal.  Nose: Mucosal edema and rhinorrhea present. Right sinus exhibits maxillary sinus tenderness. Right sinus exhibits no frontal sinus tenderness. Left sinus exhibits maxillary sinus tenderness. Left sinus exhibits no frontal sinus tenderness.  Mouth/Throat: Uvula is midline. No trismus in the jaw. Posterior oropharyngeal erythema present. No oropharyngeal exudate.  Eyes: No scleral icterus.  Neck: Normal range of motion. Neck supple.  Cardiovascular: Normal rate and normal heart sounds.   Pulmonary/Chest: Effort normal. She has wheezes. She has no rales.  Musculoskeletal: She exhibits no edema.  Lymphadenopathy:    She has no cervical adenopathy.  Neurological: She is alert and oriented to person, place, and time.  Vitals reviewed.   Lab Results  Component Value Date   WBC 7.1 03/27/2016   HGB 13.2 03/27/2016   HCT 37.7 03/27/2016   PLT 196 03/27/2016   GLUCOSE 114 (H) 03/27/2016   CHOL 192 08/30/2014   TRIG  138.0 08/30/2014   HDL 52.90 08/30/2014   LDLDIRECT 119.5 09/04/2012   LDLCALC 112 (H) 08/30/2014   ALT 26 11/17/2015   AST 37 11/17/2015   NA 140 03/27/2016   K 3.6 03/27/2016   CL 103 03/27/2016   CREATININE 0.75 03/27/2016   BUN 15 03/27/2016   CO2 25 03/27/2016   TSH 1.19 10/24/2014   INR 0.93 09/30/2011   HGBA1C 5.7 08/30/2014    Dg Chest 2 View  Result Date: 03/27/2016 CLINICAL DATA:  Syncope x 1 day EXAM: CHEST  2 VIEW COMPARISON:  06/21/2015 FINDINGS: There is no focal parenchymal opacity. There is no pleural effusion or pneumothorax. The heart and  mediastinal contours are unremarkable. There are bilateral shoulder arthroplasties. IMPRESSION: No active cardiopulmonary disease. Electronically Signed   By: Kathreen Devoid   On: 03/27/2016 18:31   Ct Head Wo Contrast  Result Date: 03/27/2016 CLINICAL DATA:  Patient with syncopal episode.  Shortness of breath. EXAM: CT HEAD WITHOUT CONTRAST TECHNIQUE: Contiguous axial images were obtained from the base of the skull through the vertex without intravenous contrast. COMPARISON:  Brain CT 10/25/2014 FINDINGS: Ventricles and sulci are appropriate for patient's age. Bilateral basal ganglia calcifications. No evidence for acute cortically based infarct, intracranial hemorrhage, mass lesion or mass-effect. Orbits are unremarkable. Paranasal sinuses are well aerated. Mastoid air cells unremarkable. Calvarium is intact. Small amount of fluid within the sphenoid sinus. IMPRESSION: No acute intracranial process. Small amount of fluid within the sphenoid sinus. Electronically Signed   By: Lovey Newcomer M.D.   On: 03/27/2016 19:27    Assessment & Plan:   Pacience was seen today for cough.  Diagnoses and all orders for this visit:  Acute bronchitis, unspecified organism -     ipratropium-albuterol (DUONEB) 0.5-2.5 (3) MG/3ML nebulizer solution 3 mL; Take 3 mLs by nebulization once. -     HYDROcodone-homatropine (HYCODAN) 5-1.5 MG/5ML syrup; Take 5 mLs by mouth at bedtime as needed for cough. -     albuterol (PROVENTIL HFA;VENTOLIN HFA) 108 (90 Base) MCG/ACT inhaler; Inhale 2 puffs into the lungs every 6 (six) hours as needed for wheezing or shortness of breath. -     dextromethorphan-guaiFENesin (MUCINEX DM) 30-600 MG 12hr tablet; Take 1 tablet by mouth 2 (two) times daily as needed for cough. -     methylPREDNISolone acetate (DEPO-MEDROL) injection 40 mg; Inject 1 mL (40 mg total) into the muscle once. -     Discontinue: azithromycin (ZITHROMAX Z-PAK) 250 MG tablet; Take 1 tablet (250 mg total) by mouth daily. Take  2tabs on first day, then 1tab once a day till complete -     azithromycin (ZITHROMAX) 500 MG tablet; Take 1 tablet (500 mg total) by mouth daily.  Acute non-recurrent pansinusitis -     HYDROcodone-homatropine (HYCODAN) 5-1.5 MG/5ML syrup; Take 5 mLs by mouth at bedtime as needed for cough. -     albuterol (PROVENTIL HFA;VENTOLIN HFA) 108 (90 Base) MCG/ACT inhaler; Inhale 2 puffs into the lungs every 6 (six) hours as needed for wheezing or shortness of breath. -     dextromethorphan-guaiFENesin (MUCINEX DM) 30-600 MG 12hr tablet; Take 1 tablet by mouth 2 (two) times daily as needed for cough. -     methylPREDNISolone acetate (DEPO-MEDROL) injection 40 mg; Inject 1 mL (40 mg total) into the muscle once. -     Discontinue: azithromycin (ZITHROMAX Z-PAK) 250 MG tablet; Take 1 tablet (250 mg total) by mouth daily. Take 2tabs on first day, then 1tab once  a day till complete -     azithromycin (ZITHROMAX) 500 MG tablet; Take 1 tablet (500 mg total) by mouth daily.   I have discontinued Ms. Savini's amoxicillin, benzonatate, ipratropium, and azithromycin. I am also having her start on HYDROcodone-homatropine, albuterol, dextromethorphan-guaiFENesin, and azithromycin. Additionally, I am having her maintain her Calcium Carbonate-Vitamin D, Polyethyl Glycol-Propyl Glycol (SYSTANE OP), Ibuprofen (ADVIL PO), TURMERIC PO, Multiple Vitamins-Minerals (CENTRUM SILVER PO), PRESERVISION AREDS 2, mirabegron ER, and ondansetron. We administered ipratropium-albuterol. We will continue to administer methylPREDNISolone acetate.  Meds ordered this encounter  Medications  . DISCONTD: amoxicillin (AMOXIL) 875 MG tablet  . DISCONTD: benzonatate (TESSALON) 100 MG capsule  . DISCONTD: ipratropium (ATROVENT) 0.06 % nasal spray  . ondansetron (ZOFRAN-ODT) 4 MG disintegrating tablet  . ipratropium-albuterol (DUONEB) 0.5-2.5 (3) MG/3ML nebulizer solution 3 mL  . HYDROcodone-homatropine (HYCODAN) 5-1.5 MG/5ML syrup    Sig: Take  5 mLs by mouth at bedtime as needed for cough.    Dispense:  120 mL    Refill:  0    Order Specific Question:   Supervising Provider    Answer:   Cassandria Anger [[id number]]  . albuterol (PROVENTIL HFA;VENTOLIN HFA) 108 (90 Base) MCG/ACT inhaler    Sig: Inhale 2 puffs into the lungs every 6 (six) hours as needed for wheezing or shortness of breath.    Dispense:  1 Inhaler    Refill:  0    Order Specific Question:   Supervising Provider    Answer:   Cassandria Anger [[id number]]  . dextromethorphan-guaiFENesin (MUCINEX DM) 30-600 MG 12hr tablet    Sig: Take 1 tablet by mouth 2 (two) times daily as needed for cough.    Dispense:  14 tablet    Refill:  0    Order Specific Question:   Supervising Provider    Answer:   Cassandria Anger [[id number]]  . methylPREDNISolone acetate (DEPO-MEDROL) injection 40 mg  . DISCONTD: azithromycin (ZITHROMAX Z-PAK) 250 MG tablet    Sig: Take 1 tablet (250 mg total) by mouth daily. Take 2tabs on first day, then 1tab once a day till complete    Dispense:  6 tablet    Refill:  0    Order Specific Question:   Supervising Provider    Answer:   Cassandria Anger [[id number]]  . azithromycin (ZITHROMAX) 500 MG tablet    Sig: Take 1 tablet (500 mg total) by mouth daily.    Dispense:  5 tablet    Refill:  0    This is to replace azithromycin 250mg  pack    Order Specific Question:   Supervising Provider    Answer:   Cassandria Anger [[id number]]    Follow-up: No Follow-up on file.  Wilfred Lacy, NP

## 2016-12-17 NOTE — Progress Notes (Signed)
Pre visit review using our clinic review tool, if applicable. No additional management support is needed unless otherwise documented below in the visit note. 

## 2016-12-23 ENCOUNTER — Encounter: Payer: Medicare Other | Admitting: Internal Medicine

## 2016-12-31 ENCOUNTER — Ambulatory Visit (INDEPENDENT_AMBULATORY_CARE_PROVIDER_SITE_OTHER): Payer: Medicare Other | Admitting: Internal Medicine

## 2016-12-31 ENCOUNTER — Encounter: Payer: Self-pay | Admitting: Internal Medicine

## 2016-12-31 ENCOUNTER — Other Ambulatory Visit (INDEPENDENT_AMBULATORY_CARE_PROVIDER_SITE_OTHER): Payer: Medicare Other

## 2016-12-31 VITALS — BP 140/78 | HR 81 | Temp 97.6°F | Resp 16 | Wt 205.0 lb

## 2016-12-31 DIAGNOSIS — R7309 Other abnormal glucose: Secondary | ICD-10-CM | POA: Diagnosis not present

## 2016-12-31 DIAGNOSIS — E785 Hyperlipidemia, unspecified: Secondary | ICD-10-CM | POA: Diagnosis not present

## 2016-12-31 DIAGNOSIS — R05 Cough: Secondary | ICD-10-CM | POA: Insufficient documentation

## 2016-12-31 DIAGNOSIS — Z0000 Encounter for general adult medical examination without abnormal findings: Secondary | ICD-10-CM | POA: Diagnosis not present

## 2016-12-31 DIAGNOSIS — Z1231 Encounter for screening mammogram for malignant neoplasm of breast: Secondary | ICD-10-CM

## 2016-12-31 DIAGNOSIS — R142 Eructation: Secondary | ICD-10-CM | POA: Insufficient documentation

## 2016-12-31 DIAGNOSIS — I10 Essential (primary) hypertension: Secondary | ICD-10-CM | POA: Diagnosis not present

## 2016-12-31 DIAGNOSIS — N951 Menopausal and female climacteric states: Secondary | ICD-10-CM | POA: Diagnosis not present

## 2016-12-31 DIAGNOSIS — R059 Cough, unspecified: Secondary | ICD-10-CM | POA: Insufficient documentation

## 2016-12-31 DIAGNOSIS — M8580 Other specified disorders of bone density and structure, unspecified site: Secondary | ICD-10-CM

## 2016-12-31 LAB — URINALYSIS, ROUTINE W REFLEX MICROSCOPIC
BILIRUBIN URINE: NEGATIVE
HGB URINE DIPSTICK: NEGATIVE
KETONES UR: NEGATIVE
NITRITE: NEGATIVE
Specific Gravity, Urine: 1.015 (ref 1.000–1.030)
TOTAL PROTEIN, URINE-UPE24: NEGATIVE
URINE GLUCOSE: NEGATIVE
Urobilinogen, UA: 0.2 (ref 0.0–1.0)
pH: 6.5 (ref 5.0–8.0)

## 2016-12-31 LAB — COMPREHENSIVE METABOLIC PANEL
ALBUMIN: 4.5 g/dL (ref 3.5–5.2)
ALK PHOS: 113 U/L (ref 39–117)
ALT: 27 U/L (ref 0–35)
AST: 24 U/L (ref 0–37)
BUN: 23 mg/dL (ref 6–23)
CALCIUM: 10.2 mg/dL (ref 8.4–10.5)
CO2: 31 mEq/L (ref 19–32)
Chloride: 104 mEq/L (ref 96–112)
Creatinine, Ser: 0.84 mg/dL (ref 0.40–1.20)
GFR: 70.41 mL/min (ref >60.00)
Glucose, Bld: 113 mg/dL — ABNORMAL HIGH (ref 70–99)
POTASSIUM: 4.7 meq/L (ref 3.5–5.1)
SODIUM: 141 meq/L (ref 135–145)
TOTAL PROTEIN: 7.7 g/dL (ref 6.0–8.3)
Total Bilirubin: 0.7 mg/dL (ref 0.2–1.2)

## 2016-12-31 LAB — CBC WITH DIFFERENTIAL/PLATELET
Basophils Absolute: 0 10*3/uL (ref 0.0–0.1)
Basophils Relative: 0.7 % (ref 0.0–3.0)
EOS ABS: 0.1 10*3/uL (ref 0.0–0.7)
EOS PCT: 1.4 % (ref 0.0–5.0)
HCT: 40.2 % (ref 36.0–46.0)
HEMOGLOBIN: 13.8 g/dL (ref 12.0–15.0)
LYMPHS PCT: 26.8 % (ref 12.0–46.0)
Lymphs Abs: 1.8 10*3/uL (ref 0.7–4.0)
MCHC: 34.2 g/dL (ref 30.0–36.0)
MCV: 92.8 fl (ref 78.0–100.0)
MONO ABS: 0.5 10*3/uL (ref 0.1–1.0)
Monocytes Relative: 7.8 % (ref 3.0–12.0)
Neutro Abs: 4.3 10*3/uL (ref 1.4–7.7)
Neutrophils Relative %: 63.3 % (ref 43.0–77.0)
Platelets: 234 10*3/uL (ref 150.0–400.0)
RBC: 4.33 Mil/uL (ref 3.87–5.11)
RDW: 14.5 % (ref 11.5–15.5)
WBC: 6.9 10*3/uL (ref 4.0–10.5)

## 2016-12-31 LAB — LIPID PANEL
CHOLESTEROL: 188 mg/dL (ref 0–200)
HDL: 78.6 mg/dL (ref >39.00)
LDL Cholesterol: 86 mg/dL (ref 0–99)
NonHDL: 109.27
TRIGLYCERIDES: 116 mg/dL (ref 0.0–149.0)
Total CHOL/HDL Ratio: 2
VLDL: 23.2 mg/dL (ref 0.0–40.0)

## 2016-12-31 LAB — TSH: TSH: 1.48 u[IU]/mL (ref 0.35–4.50)

## 2016-12-31 LAB — HEMOGLOBIN A1C: Hgb A1c MFr Bld: 5.8 % (ref 4.6–6.5)

## 2016-12-31 MED ORDER — ALUM HYDROXIDE-MAG CARBONATE 160-105 MG PO CHEW: 1.0000 | CHEWABLE_TABLET | Freq: Three times a day (TID) | ORAL | 5 refills | Status: DC

## 2016-12-31 MED ORDER — PAROXETINE HCL 10 MG PO TABS: 10.0000 mg | ORAL_TABLET | Freq: Every day | ORAL | 1 refills | Status: DC

## 2016-12-31 NOTE — Progress Notes (Signed)
Subjective:  Patient ID: Veronica Garza, female    DOB: Aug 19, 1941  Age: 76 y.o. MRN: [id number]  CC: Annual Exam; Hypertension; Hyperlipidemia; and Cough   HPI ALEXARAE LEVITON presents for an AWV/CPX.  She complains of persistent hot flashes and wants to try something to help with the symptoms. She also complains of chronic belching but she denies heartburn, odynophagia, dysphagia, loss of appetite, or weight loss.  Past Medical History:  Diagnosis Date  . Anemia   . Arthritis   . Cataract   . GERD (gastroesophageal reflux disease)   . Hx of shoulder replacement    lt  . Hypertension   . OSA (obstructive sleep apnea)   . Pneumonia    History of  . Urinary incontinence    Past Surgical History:  Procedure Laterality Date  . ABDOMINAL HYSTERECTOMY    . EYE SURGERY     bilateral cataract extraction  . NASAL SINUS SURGERY    . SHOULDER ARTHROSCOPY     Left  . TOTAL SHOULDER ARTHROPLASTY  10/10/2011   Procedure: TOTAL SHOULDER ARTHROPLASTY;  Surgeon: Metta Clines Supple;  Location: [facility];  Service: Orthopedics;  Laterality: Right;  . UPPER GASTROINTESTINAL ENDOSCOPY    . Vaginal Sling      reports that she has never smoked. She has never used smokeless tobacco. She reports that she does not drink alcohol or use drugs. family history includes Alzheimer's disease in her mother; Cancer in her father and sister; Diabetes in her sister. Allergies  Allergen Reactions  . Iohexol Hives     Code: HIVES, Desc: ? Contrast reaction from CT prior to 2000.   . Naproxen Itching and Rash  . Neomycin-Bacitracin Zn-Polymyx Itching and Rash    REACTION: redness  . Sulfonamide Derivatives Itching and Rash    REACTION: rash/tingling    Outpatient Medications Prior to Visit  Medication Sig Dispense Refill  . albuterol (PROVENTIL HFA;VENTOLIN HFA) 108 (90 Base) MCG/ACT inhaler Inhale 2 puffs into the lungs every 6 (six) hours as needed for wheezing or shortness of breath. 1 Inhaler 0  .  Calcium Carbonate-Vitamin D (CALTRATE 600+D) 600-400 MG-UNIT per tablet Take 1 tablet by mouth 2 (two) times daily.     . mirabegron ER (MYRBETRIQ) 50 MG TB24 tablet Take 50 mg by mouth daily.    Vladimir Faster Glycol-Propyl Glycol (SYSTANE OP) Apply 1 drop to eye as directed. Into both eyes four times a day for dry eyes    . TURMERIC PO Take 1 tablet by mouth daily. Take one by mouth daily    . HYDROcodone-homatropine (HYCODAN) 5-1.5 MG/5ML syrup Take 5 mLs by mouth at bedtime as needed for cough. 120 mL 0  . Ibuprofen (ADVIL PO) Take 200-400 mg by mouth 2 (two) times daily as needed (FOR PAIN). Take 2 to 4 daily prn    . Multiple Vitamins-Minerals (CENTRUM SILVER PO) Take 0.5 tablets by mouth 2 (two) times daily.     . Multiple Vitamins-Minerals (PRESERVISION AREDS 2) CAPS Take 1 tablet by mouth 2 (two) times daily.    Marland Kitchen azithromycin (ZITHROMAX) 500 MG tablet Take 1 tablet (500 mg total) by mouth daily. (Patient not taking: Reported on 12/31/2016) 5 tablet 0  . dextromethorphan-guaiFENesin (MUCINEX DM) 30-600 MG 12hr tablet Take 1 tablet by mouth 2 (two) times daily as needed for cough. (Patient not taking: Reported on 12/31/2016) 14 tablet 0  . ondansetron (ZOFRAN-ODT) 4 MG disintegrating tablet      No facility-administered medications  prior to visit.     ROS Review of Systems  Constitutional: Negative.  Negative for appetite change, diaphoresis, fatigue and unexpected weight change.  HENT: Negative.  Negative for sinus pressure, trouble swallowing and voice change.   Eyes: Negative.  Negative for visual disturbance.  Respiratory: Negative for cough, chest tightness, shortness of breath, wheezing and stridor.   Cardiovascular: Negative for chest pain, palpitations and leg swelling.  Gastrointestinal: Negative for abdominal pain, constipation, diarrhea, nausea and vomiting.  Endocrine: Negative.   Genitourinary: Negative.  Negative for decreased urine volume, difficulty urinating, dysuria,  flank pain, frequency, hematuria and urgency.  Musculoskeletal: Negative for back pain, myalgias and neck pain.  Skin: Negative.  Negative for color change and rash.  Allergic/Immunologic: Negative.   Neurological: Negative.  Negative for dizziness, weakness, numbness and headaches.  Hematological: Negative for adenopathy. Does not bruise/bleed easily.  Psychiatric/Behavioral: Negative.     Objective:  BP 140/78   Pulse 81   Temp 97.6 F (36.4 C) (Oral)   Resp 16   Wt 205 lb (93 kg)   SpO2 98%   BMI 40.04 kg/m   BP Readings from Last 3 Encounters:  12/31/16 140/78  12/17/16 (!) 142/78  10/17/16 122/78    Wt Readings from Last 3 Encounters:  12/31/16 205 lb (93 kg)  12/17/16 209 lb (94.8 kg)  10/17/16 210 lb 12.8 oz (95.6 kg)    Physical Exam  Constitutional: She is oriented to person, place, and time. No distress.  HENT:  Mouth/Throat: Oropharynx is clear and moist. No oropharyngeal exudate.  Eyes: Conjunctivae are normal. Right eye exhibits no discharge. Left eye exhibits no discharge. No scleral icterus.  Neck: Normal range of motion. Neck supple. No JVD present. No tracheal deviation present. No thyromegaly present.  Cardiovascular: Normal rate, normal heart sounds and intact distal pulses.  An irregular rhythm present. Exam reveals no gallop and no friction rub.   No murmur heard. EKG --- Sinus  Rhythm  -With rate variation  cv = 14. Poor SNR (< 1) in leads  V6  WITHIN NORMAL LIMITS  Pulmonary/Chest: Effort normal and breath sounds normal. No stridor. No respiratory distress. She has no wheezes. She has no rales. She exhibits no tenderness.  Abdominal: Soft. Bowel sounds are normal. She exhibits no distension and no mass. There is no tenderness. There is no rebound and no guarding.  Musculoskeletal: Normal range of motion. She exhibits no edema, tenderness or deformity.  Lymphadenopathy:    She has no cervical adenopathy.  Neurological: She is oriented to person,  place, and time.  Skin: Skin is warm and dry. No rash noted. She is not diaphoretic. No erythema. No pallor.  Psychiatric: She has a normal mood and affect. Her behavior is normal. Judgment and thought content normal.  Vitals reviewed.   Lab Results  Component Value Date   WBC 6.9 12/31/2016   HGB 13.8 12/31/2016   HCT 40.2 12/31/2016   PLT 234.0 12/31/2016   GLUCOSE 113 (H) 12/31/2016   CHOL 188 12/31/2016   TRIG 116.0 12/31/2016   HDL 78.60 12/31/2016   LDLDIRECT 119.5 09/04/2012   LDLCALC 86 12/31/2016   ALT 27 12/31/2016   AST 24 12/31/2016   NA 141 12/31/2016   K 4.7 12/31/2016   CL 104 12/31/2016   CREATININE 0.84 12/31/2016   BUN 23 12/31/2016   CO2 31 12/31/2016   TSH 1.48 12/31/2016   INR 0.93 09/30/2011   HGBA1C 5.8 12/31/2016    Dg Chest  2 View  Result Date: 03/27/2016 CLINICAL DATA:  Syncope x 1 day EXAM: CHEST  2 VIEW COMPARISON:  06/21/2015 FINDINGS: There is no focal parenchymal opacity. There is no pleural effusion or pneumothorax. The heart and mediastinal contours are unremarkable. There are bilateral shoulder arthroplasties. IMPRESSION: No active cardiopulmonary disease. Electronically Signed   By: Kathreen Devoid   On: 03/27/2016 18:31   Ct Head Wo Contrast  Result Date: 03/27/2016 CLINICAL DATA:  Patient with syncopal episode.  Shortness of breath. EXAM: CT HEAD WITHOUT CONTRAST TECHNIQUE: Contiguous axial images were obtained from the base of the skull through the vertex without intravenous contrast. COMPARISON:  Brain CT 10/25/2014 FINDINGS: Ventricles and sulci are appropriate for patient's age. Bilateral basal ganglia calcifications. No evidence for acute cortically based infarct, intracranial hemorrhage, mass lesion or mass-effect. Orbits are unremarkable. Paranasal sinuses are well aerated. Mastoid air cells unremarkable. Calvarium is intact. Small amount of fluid within the sphenoid sinus. IMPRESSION: No acute intracranial process. Small amount of fluid  within the sphenoid sinus. Electronically Signed   By: Lovey Newcomer M.D.   On: 03/27/2016 19:27    Assessment & Plan:   Adaleah was seen today for annual exam, hypertension, hyperlipidemia and cough.  Diagnoses and all orders for this visit:  Essential hypertension, benign- her blood pressures isadequately well-controlled, electrolytes and renal function are normal. -     Comprehensive metabolic panel; Future -     CBC with Differential/Platelet; Future -     Urinalysis, Routine w reflex microscopic; Future  Osteopenia of the elderly  Other abnormal glucose- her A1c is 5.8%. She is prediabetic. No medications are needed to treat this. -     Hemoglobin A1c; Future  Routine general medical examination at a health care facility -     EKG 12-Lead  Hyperlipidemia LDL goal <130- her Framingham risk score is only 5% so I do not recommend that she take a statin. -     Lipid panel; Future -     TSH; Future  Cough  Visit for screening mammogram -     MM DIGITAL SCREENING BILATERAL; Future  Functional belching disorder -     Alum Hydroxide-Mag Carbonate (GAVISCON EXTRA STRENGTH) 160-105 MG CHEW; Chew 1 tablet by mouth 4 (four) times daily -  with meals and at bedtime.  Hot flashes, menopausal -     PARoxetine (PAXIL) 10 MG tablet; Take 1 tablet (10 mg total) by mouth daily.   I have discontinued Ms. Twyford's Ibuprofen (ADVIL PO), Multiple Vitamins-Minerals (CENTRUM SILVER PO), PRESERVISION AREDS 2, ondansetron, HYDROcodone-homatropine, dextromethorphan-guaiFENesin, and azithromycin. I am also having her start on PARoxetine and Alum Hydroxide-Mag Carbonate. Additionally, I am having her maintain her Calcium Carbonate-Vitamin D, Polyethyl Glycol-Propyl Glycol (SYSTANE OP), TURMERIC PO, mirabegron ER, and albuterol.  Meds ordered this encounter  Medications  . PARoxetine (PAXIL) 10 MG tablet    Sig: Take 1 tablet (10 mg total) by mouth daily.    Dispense:  90 tablet    Refill:  1  .  Alum Hydroxide-Mag Carbonate (GAVISCON EXTRA STRENGTH) 160-105 MG CHEW    Sig: Chew 1 tablet by mouth 4 (four) times daily -  with meals and at bedtime.    Dispense:  100 tablet    Refill:  5     Follow-up: Return in about 6 months (around 06/30/2017).  Scarlette Calico, MD

## 2016-12-31 NOTE — Patient Instructions (Signed)

## 2017-01-01 ENCOUNTER — Encounter: Payer: Self-pay | Admitting: Internal Medicine

## 2017-01-07 DIAGNOSIS — H524 Presbyopia: Secondary | ICD-10-CM | POA: Diagnosis not present

## 2017-01-07 DIAGNOSIS — H04123 Dry eye syndrome of bilateral lacrimal glands: Secondary | ICD-10-CM | POA: Diagnosis not present

## 2017-01-07 DIAGNOSIS — H52222 Regular astigmatism, left eye: Secondary | ICD-10-CM | POA: Diagnosis not present

## 2017-01-07 DIAGNOSIS — H52221 Regular astigmatism, right eye: Secondary | ICD-10-CM | POA: Diagnosis not present

## 2017-01-07 DIAGNOSIS — H353131 Nonexudative age-related macular degeneration, bilateral, early dry stage: Secondary | ICD-10-CM | POA: Diagnosis not present

## 2017-01-07 DIAGNOSIS — H5212 Myopia, left eye: Secondary | ICD-10-CM | POA: Diagnosis not present

## 2017-02-06 ENCOUNTER — Telehealth: Payer: Self-pay | Admitting: Pulmonary Disease

## 2017-02-06 DIAGNOSIS — G4733 Obstructive sleep apnea (adult) (pediatric): Secondary | ICD-10-CM

## 2017-02-06 NOTE — Telephone Encounter (Signed)
Pt having problems with cpap- collecting too much moisture, makes "bad popping noise like a Chief Financial Officer". Pt called [facility], who advised her to turn down humidity from a 3 to a 2, states that machine is still is giving off too much moisture.  Pt states she will have water coming into her mask at night.  Pt called back to [facility] who suggested she ask [place] for a "moisture cube". Called [facility], states that pt needs an order for a climate line to help cut down on condensation between cold bedrooms and heated humidifier.    RA ok to place order for climate line?  Thanks!

## 2017-02-07 NOTE — Telephone Encounter (Signed)
Ok  Or she can cut out humidifier totally for now

## 2017-02-07 NOTE — Telephone Encounter (Signed)
Attempted to contact pt. No answer, no option to leave a message. Will try back.  

## 2017-02-11 NOTE — Telephone Encounter (Signed)
Called and spoke to pt. Advised pt per RA to cut out the humidification on CPAP, pt did not want to do that. Pt states she had previously tried it and states it completely dried her out. Pt is requesting the 'climate line' - an order was placed. Pt verbalized understanding and denied any further questions or concerns at this time.

## 2017-02-24 DIAGNOSIS — M542 Cervicalgia: Secondary | ICD-10-CM | POA: Diagnosis not present

## 2017-02-24 DIAGNOSIS — M47816 Spondylosis without myelopathy or radiculopathy, lumbar region: Secondary | ICD-10-CM | POA: Diagnosis not present

## 2017-02-24 DIAGNOSIS — M50822 Other cervical disc disorders at C5-C6 level: Secondary | ICD-10-CM | POA: Diagnosis not present

## 2017-02-24 DIAGNOSIS — M47812 Spondylosis without myelopathy or radiculopathy, cervical region: Secondary | ICD-10-CM | POA: Diagnosis not present

## 2017-03-05 DIAGNOSIS — N3946 Mixed incontinence: Secondary | ICD-10-CM | POA: Diagnosis not present

## 2017-04-22 DIAGNOSIS — H903 Sensorineural hearing loss, bilateral: Secondary | ICD-10-CM | POA: Diagnosis not present

## 2017-04-22 DIAGNOSIS — D490 Neoplasm of unspecified behavior of digestive system: Secondary | ICD-10-CM | POA: Diagnosis not present

## 2017-06-16 ENCOUNTER — Other Ambulatory Visit: Payer: Self-pay | Admitting: Internal Medicine

## 2017-06-16 DIAGNOSIS — N951 Menopausal and female climacteric states: Secondary | ICD-10-CM

## 2017-09-11 ENCOUNTER — Ambulatory Visit (INDEPENDENT_AMBULATORY_CARE_PROVIDER_SITE_OTHER): Payer: Medicare Other | Admitting: General Practice

## 2017-09-11 DIAGNOSIS — Z23 Encounter for immunization: Secondary | ICD-10-CM

## 2017-10-14 ENCOUNTER — Encounter: Payer: Self-pay | Admitting: Adult Health

## 2017-10-17 ENCOUNTER — Ambulatory Visit (INDEPENDENT_AMBULATORY_CARE_PROVIDER_SITE_OTHER): Payer: Medicare Other | Admitting: Adult Health

## 2017-10-17 ENCOUNTER — Encounter: Payer: Self-pay | Admitting: Adult Health

## 2017-10-17 DIAGNOSIS — G4733 Obstructive sleep apnea (adult) (pediatric): Secondary | ICD-10-CM | POA: Diagnosis not present

## 2017-10-17 NOTE — Assessment & Plan Note (Signed)
Controlled on CPAP   Plan  Patient Instructions  Continue on CPAP at bedtime Keep up the good work Work on healthy weight Do not drive if sleepy Follow-up in 1 year with Dr. Durwin [facility] and As needed

## 2017-10-17 NOTE — Progress Notes (Signed)
@Patient  ID: Veronica Garza, female    DOB: [birth year], 76 y.o.   MRN: [id number]  Chief Complaint  Patient presents with  . Follow-up    OSA    Referring provider: Janith Lima, MD  HPI: 76 year old female followed for obstructive sleep apnea Chronic sinus disease status post surgery 2008  Test PSG 2007 >> severe OSA with AHI of 64 events per hour, wt195 pounds >>corrected by nasal CPAP of 13 cm.   10/17/2017 Follow up : OSA  Patient presents for a follow-up for sleep apnea.  Says she is doing well on her CPAP machine.  She feels rested with no significant daytime sleepiness she wears it each night.  Patient is on CPAP 7 cm H2O.  Family shows excellent compliance with average usage around 7 hours.  AHI is 1.6.  Minimal leaks. She has has nasal mask now , does make bridge of her nose sore at times.   Allergies  Allergen Reactions  . Iohexol Hives     Code: HIVES, Desc: ? Contrast reaction from CT prior to 2000.   . Naproxen Itching and Rash  . Neomycin-Bacitracin Zn-Polymyx Itching and Rash    REACTION: redness  . Sulfonamide Derivatives Itching and Rash    REACTION: rash/tingling    Immunization History  Administered Date(s) Administered  . Influenza Split 09/04/2012  . Influenza Whole 08/30/2009, 09/18/2010, 08/08/2011  . Influenza, High Dose Seasonal PF 08/30/2014, 09/02/2016, 09/11/2017  . Influenza,inj,Quad PF,6+ Mos 09/02/2013, 09/01/2015  . Td 04/06/2010  . Zoster 09/04/2012    Past Medical History:  Diagnosis Date  . Anemia   . Arthritis   . Cataract   . GERD (gastroesophageal reflux disease)   . Hx of shoulder replacement    lt  . Hypertension   . OSA (obstructive sleep apnea)   . Pneumonia    History of  . Urinary incontinence     Tobacco History: Social History   Tobacco Use  Smoking Status Never Smoker  Smokeless Tobacco Never Used   Counseling given: Not Answered   Outpatient Encounter Medications as of 10/17/2017  Medication  Sig  . Alum Hydroxide-Mag Carbonate (GAVISCON EXTRA STRENGTH) 160-105 MG CHEW Chew 1 tablet by mouth 4 (four) times daily -  with meals and at bedtime.  . Calcium Carbonate-Vitamin D (CALTRATE 600+D) 600-400 MG-UNIT per tablet Take 1 tablet by mouth 2 (two) times daily.   . mirabegron ER (MYRBETRIQ) 50 MG TB24 tablet Take 50 mg by mouth daily.  Marland Kitchen PARoxetine (PAXIL) 10 MG tablet TAKE 1 TABLET DAILY  . Polyethyl Glycol-Propyl Glycol (SYSTANE OP) Apply 1 drop to eye as directed. Into both eyes four times a day for dry eyes  . TURMERIC PO Take 1 tablet by mouth daily. Take one by mouth daily  . albuterol (PROVENTIL HFA;VENTOLIN HFA) 108 (90 Base) MCG/ACT inhaler Inhale 2 puffs into the lungs every 6 (six) hours as needed for wheezing or shortness of breath. (Patient not taking: Reported on 10/17/2017)   No facility-administered encounter medications on file as of 10/17/2017.      Review of Systems  Constitutional:   No  weight loss, night sweats,  Fevers, chills, fatigue, or  lassitude.  HEENT:   No headaches,  Difficulty swallowing,  Tooth/dental problems, or  Sore throat,                No sneezing, itching, ear ache, nasal congestion, post nasal drip,   CV:  No chest pain,  Orthopnea, PND,  swelling in lower extremities, anasarca, dizziness, palpitations, syncope.   GI  No heartburn, indigestion, abdominal pain, nausea, vomiting, diarrhea, change in bowel habits, loss of appetite, bloody stools.   Resp: No shortness of breath with exertion or at rest.  No excess mucus, no productive cough,  No non-productive cough,  No coughing up of blood.  No change in color of mucus.  No wheezing.  No chest wall deformity  Skin: no rash or lesions.  GU: no dysuria, change in color of urine, no urgency or frequency.  No flank pain, no hematuria   MS:  No joint pain or swelling.  No decreased range of motion.  No back pain.    Physical Exam  BP 132/84 (BP Location: Right Arm, Cuff Size: Large)    Pulse 64   Ht 4\' 11"  (1.499 m)   Wt 216 lb 3.2 oz (98.1 kg)   SpO2 94%   BMI 43.67 kg/m   GEN: A/Ox3; pleasant , NAD, obese     HEENT:  [place]/AT,  EACs-clear, TMs-wnl, NOSE-clear, THROAT-clear, no lesions, no postnasal drip or exudate noted. Class 2-3 MP airway   NECK:  Supple w/ fair ROM; no JVD; normal carotid impulses w/o bruits; no thyromegaly or nodules palpated; no lymphadenopathy.    RESP  Clear  P & A; w/o, wheezes/ rales/ or rhonchi. no accessory muscle use, no dullness to percussion  CARD:  RRR, no m/r/g, no peripheral edema, pulses intact, no cyanosis or clubbing.  GI:   Soft & nt; nml bowel sounds; no organomegaly or masses detected.   Musco: Warm bil, no deformities or joint swelling noted.   Neuro: alert, no focal deficits noted.    Skin: Warm, no lesions or rashes, no skin breakdown on nose but tender along bridge of nose.     Lab Results:  CBC  BNP No results found for: BNP  ProBNP No results found for: PROBNP  Imaging: No results found.   Assessment & Plan:   Obstructive sleep apnea Controlled on CPAP   Plan  Patient Instructions  Continue on CPAP at bedtime Keep up the good work Work on healthy weight Do not drive if sleepy Follow-up in 1 year with [facility] Durwin [facility] and As needed       Morbid obesity ([place]) Wt loss      Rexene Edison, NP 10/17/2017

## 2017-10-17 NOTE — Addendum Note (Signed)
Addended by: Rocky Morel D on: 10/17/2017 11:51 AM   Modules accepted: Orders

## 2017-10-17 NOTE — Patient Instructions (Signed)
Continue on CPAP at bedtime Keep up the good work Work on [employer] Do not drive if sleepy Follow-up in 1 year with Dr[facility] Durwin [facility] and As needed

## 2017-10-17 NOTE — Assessment & Plan Note (Signed)
Wt loss  

## 2017-10-27 ENCOUNTER — Telehealth: Payer: Self-pay | Admitting: Adult Health

## 2017-10-27 NOTE — Telephone Encounter (Signed)
Order has been sent stat to [facility] they are all in one now

## 2017-12-04 ENCOUNTER — Other Ambulatory Visit: Payer: Self-pay | Admitting: Internal Medicine

## 2017-12-31 NOTE — Progress Notes (Addendum)
Subjective:   Veronica Garza is a 77 y.o. female who presents for Medicare Annual (Subsequent) preventive examination.  Review of Systems:  No ROS.  Medicare Wellness Visit. Additional risk factors are reflected in the social history.  Cardiac Risk Factors include: advanced age (>69men, >81 women);dyslipidemia;obesity (BMI >30kg/m2);sedentary lifestyle;hypertension Sleep patterns: feels rested on waking, gets up 2 times nightly to void and sleeps 7-8 hours nightly.   Home Safety/Smoke Alarms: Feels safe in home. Smoke alarms in place.  Living environment; residence and Firearm Safety: 1-story house/ trailer, equipment: Radio producer, Type: Narrow [employer], no firearms. Lives with husband, no needs for DME, good support system Seat Belt Safety/Bike Helmet: Wears seat belt.     Objective:     Vitals: There were no vitals taken for this visit.  There is no height or weight on file to calculate BMI.  Advanced Directives 01/01/2018 01/07/2017 03/27/2016 02/20/2016 01/05/2016 12/08/2015 11/17/2015  Does Patient Have a Medical Advance Directive? Yes Yes Yes No No No No  Type of Paramedic of [place];Living will [place];Living will [place] in Chart? No - copy requested - - - - - -  Would patient like information on creating a medical advance directive? - - - - - - No - patient declined information  Pre-existing out of facility DNR order (yellow form or pink MOST form) - - - - - - -    Tobacco Social History   Tobacco Use  Smoking Status Never Smoker  Smokeless Tobacco Never Used     Counseling given: Not Answered   Past Medical History:  Diagnosis Date  . Anemia   . Arthritis   . Cataract   . GERD (gastroesophageal reflux disease)   . Hx of shoulder replacement    lt  . Hypertension   . OSA (obstructive sleep apnea)   . Pneumonia    History of  . Urinary incontinence     Past Surgical History:  Procedure Laterality Date  . ABDOMINAL HYSTERECTOMY    . EYE SURGERY     bilateral cataract extraction  . NASAL SINUS SURGERY    . SHOULDER ARTHROSCOPY     Left  . TOTAL SHOULDER ARTHROPLASTY  10/10/2011   Procedure: TOTAL SHOULDER ARTHROPLASTY;  Surgeon: Metta Clines Supple;  Location: [facility];  Service: Orthopedics;  Laterality: Right;  . UPPER GASTROINTESTINAL ENDOSCOPY    . Vaginal Sling     Family History  Problem Relation Age of Onset  . Cancer Father        Prostate cancer  . Cancer Sister        Ovarian cancer  . Diabetes Sister   . Alzheimer's disease Mother   . COPD Neg Hx   . Alcohol abuse Neg Hx   . Early death Neg Hx   . Heart disease Neg Hx   . Hyperlipidemia Neg Hx   . Hypertension Neg Hx   . Kidney disease Neg Hx   . Stroke Neg Hx    Social History   Socioeconomic History  . Marital status: Married    Spouse name: Not on file  . Number of children: 1  . Years of education: Not on file  . Highest education level: Not on file  Social Needs  . Financial resource strain: Not hard at all  . Food insecurity - worry: Never true  . Food insecurity - inability:  Never true  . Transportation needs - medical: No  . Transportation needs - non-medical: No  Occupational History  . Occupation: Retired  Tobacco Use  . Smoking status: Never Smoker  . Smokeless tobacco: Never Used  Substance and Sexual Activity  . Alcohol use: No  . Drug use: No  . Sexual activity: Not Currently  Other Topics Concern  . Not on file  Social History Narrative   Regular exercise-Yes   Retired    Married-59 boy    Outpatient Encounter Medications as of 01/01/2018  Medication Sig  . Calcium Carbonate-Vitamin D (CALTRATE 600+D) 600-400 MG-UNIT per tablet Take 1 tablet by mouth 2 (two) times daily.   . mirabegron ER (MYRBETRIQ) 50 MG TB24 tablet Take 50 mg by mouth daily.  Marland Kitchen PARoxetine (PAXIL) 10 MG tablet TAKE 1 TABLET DAILY  . Polyethyl Glycol-Propyl Glycol  (SYSTANE OP) Apply 1 drop to eye as directed. Into both eyes four times a day for dry eyes  . TURMERIC PO Take 1 tablet by mouth daily. Take one by mouth daily  . [DISCONTINUED] albuterol (PROVENTIL HFA;VENTOLIN HFA) 108 (90 Base) MCG/ACT inhaler Inhale 2 puffs into the lungs every 6 (six) hours as needed for wheezing or shortness of breath. (Patient not taking: Reported on 10/17/2017)  . [DISCONTINUED] Alum Hydroxide-Mag Carbonate (GAVISCON EXTRA STRENGTH) 160-105 MG CHEW Chew 1 tablet by mouth 4 (four) times daily -  with meals and at bedtime.  . [DISCONTINUED] PARoxetine (PAXIL) 10 MG tablet TAKE 1 TABLET DAILY   No facility-administered encounter medications on file as of 01/01/2018.     Activities of Daily Living In your present state of health, do you have any difficulty performing the following activities: 01/01/2018  Hearing? Y  Vision? N  Difficulty concentrating or making decisions? N  Walking or climbing stairs? Y  Dressing or bathing? N  Doing errands, shopping? N  Preparing Food and eating ? N  Using the Toilet? N  In the past six months, have you accidently leaked urine? Y  Do you have problems with loss of bowel control? N  Managing your Medications? N  Managing your Finances? N  Housekeeping or managing your Housekeeping? N  Some recent data might be hidden    Patient Care Team: Janith Lima, MD as PCP - General    Assessment:   This is a routine wellness examination for [name]. Physical assessment deferred to PCP.   Exercise Activities and Dietary recommendations Current Exercise Habits: The patient does not participate in regular exercise at present(chair exercise pamphlets provided), Exercise limited by: orthopedic condition(s) Diet (meal preparation, eat out, water intake, caffeinated beverages, dairy products, fruits and vegetables): in general, a "healthy" diet  , well balanced   Reviewed heart healthy diet, encouraged patient to increase daily water  intake. Discussed weight loss strategies, Diet education was provided via handout.  Goals    . Patient Stated     Increase my physical activity as tolerated, start doing chair exercises. Enjoy life and family.       Fall Risk Fall Risk  01/01/2018 01/07/2017 05/27/2013  Falls in the past year? No No No  Risk for fall due to : Impaired mobility;Impaired balance/gait - -  Risk for fall due to: Comment discussed fall safety - -    Depression Screen PHQ 2/9 Scores 01/01/2018 01/07/2017 05/27/2013  PHQ - 2 Score 0 0 0  PHQ- 9 Score 2 - -     Cognitive Function MMSE - Mini Mental State  Exam 01/01/2018  Orientation to time 5  Orientation to Place 5  Registration 3  Attention/ Calculation 5  Recall 2  Language- name 2 objects 2  Language- repeat 1  Language- follow 3 step command 3  Language- read & follow direction 1  Write a sentence 1  Copy design 1  Total score 29        Immunization History  Administered Date(s) Administered  . Influenza Split 09/04/2012  . Influenza Whole 08/30/2009, 09/18/2010, 08/08/2011  . Influenza, High Dose Seasonal PF 08/30/2014, 09/02/2016, 09/11/2017  . Influenza,inj,Quad PF,6+ Mos 09/02/2013, 09/01/2015  . Pneumococcal Conjugate-13 01/01/2018  . Td 04/06/2010  . Zoster 09/04/2012   Screening Tests Health Maintenance  Topic Date Due  . COLONOSCOPY  07/27/2018  . PNA vac Low Risk Adult (2 of 2 - PPSV23) 01/01/2019  . TETANUS/TDAP  04/06/2020  . INFLUENZA VACCINE  Completed  . DEXA SCAN  Completed      Plan:  Continue doing brain stimulating activities (puzzles, reading, adult coloring books, staying active) to keep memory sharp.   Continue to eat heart healthy diet (full of fruits, vegetables, whole grains, lean protein, water--limit salt, fat, and sugar intake) and increase physical activity as tolerated.  I have personally reviewed and noted the following in the patient's chart:   . Medical and social history . Use of alcohol,  tobacco or illicit drugs  . Current medications and supplements . Functional ability and status . Nutritional status . Physical activity . Advanced directives . List of other physicians . Vitals . Screenings to include cognitive, depression, and falls . Referrals and appointments  In addition, I have reviewed and discussed with patient certain preventive protocols, quality metrics, and best practice recommendations. A written personalized care plan for preventive services as well as general preventive health recommendations were provided to patient.   Medical screening examination/treatment/procedure(s) were performed by non-physician practitioner and as supervising physician I was immediately available for consultation/collaboration. I agree with above. Scarlette Calico, MD   Michiel Cowboy, RN  01/01/2018

## 2018-01-01 ENCOUNTER — Ambulatory Visit (INDEPENDENT_AMBULATORY_CARE_PROVIDER_SITE_OTHER): Payer: Medicare Other | Admitting: Internal Medicine

## 2018-01-01 ENCOUNTER — Encounter: Payer: Self-pay | Admitting: Internal Medicine

## 2018-01-01 ENCOUNTER — Other Ambulatory Visit (INDEPENDENT_AMBULATORY_CARE_PROVIDER_SITE_OTHER): Payer: Medicare Other

## 2018-01-01 ENCOUNTER — Ambulatory Visit (INDEPENDENT_AMBULATORY_CARE_PROVIDER_SITE_OTHER): Payer: Medicare Other | Admitting: *Deleted

## 2018-01-01 VITALS — BP 138/90 | HR 95 | Temp 97.8°F | Ht 59.0 in | Wt 212.1 lb

## 2018-01-01 DIAGNOSIS — I10 Essential (primary) hypertension: Secondary | ICD-10-CM

## 2018-01-01 DIAGNOSIS — Z23 Encounter for immunization: Secondary | ICD-10-CM | POA: Diagnosis not present

## 2018-01-01 DIAGNOSIS — G3281 Cerebellar ataxia in diseases classified elsewhere: Secondary | ICD-10-CM

## 2018-01-01 DIAGNOSIS — Z0000 Encounter for general adult medical examination without abnormal findings: Secondary | ICD-10-CM | POA: Diagnosis not present

## 2018-01-01 DIAGNOSIS — E785 Hyperlipidemia, unspecified: Secondary | ICD-10-CM

## 2018-01-01 DIAGNOSIS — R270 Ataxia, unspecified: Secondary | ICD-10-CM

## 2018-01-01 LAB — LIPID PANEL
CHOLESTEROL: 179 mg/dL (ref 0–200)
HDL: 62.1 mg/dL (ref >39.00)
LDL Cholesterol: 90 mg/dL (ref 0–99)
NONHDL: 117.38
Total CHOL/HDL Ratio: 3
Triglycerides: 139 mg/dL (ref 0.0–149.0)
VLDL: 27.8 mg/dL (ref 0.0–40.0)

## 2018-01-01 LAB — COMPREHENSIVE METABOLIC PANEL
ALBUMIN: 4.3 g/dL (ref 3.5–5.2)
ALK PHOS: 110 U/L (ref 39–117)
ALT: 22 U/L (ref 0–35)
AST: 22 U/L (ref 0–37)
BUN: 21 mg/dL (ref 6–23)
CO2: 31 mEq/L (ref 19–32)
CREATININE: 0.74 mg/dL (ref 0.40–1.20)
Calcium: 10 mg/dL (ref 8.4–10.5)
Chloride: 101 mEq/L (ref 96–112)
GFR: 81.28 mL/min (ref >60.00)
Glucose, Bld: 112 mg/dL — ABNORMAL HIGH (ref 70–99)
POTASSIUM: 4.2 meq/L (ref 3.5–5.1)
SODIUM: 140 meq/L (ref 135–145)
TOTAL PROTEIN: 7.5 g/dL (ref 6.0–8.3)
Total Bilirubin: 0.8 mg/dL (ref 0.2–1.2)

## 2018-01-01 LAB — CBC WITH DIFFERENTIAL/PLATELET
BASOS PCT: 0.8 % (ref 0.0–3.0)
Basophils Absolute: 0 10*3/uL (ref 0.0–0.1)
EOS PCT: 3 % (ref 0.0–5.0)
Eosinophils Absolute: 0.2 10*3/uL (ref 0.0–0.7)
HEMATOCRIT: 39.5 % (ref 36.0–46.0)
HEMOGLOBIN: 13.7 g/dL (ref 12.0–15.0)
LYMPHS PCT: 31.5 % (ref 12.0–46.0)
Lymphs Abs: 1.7 10*3/uL (ref 0.7–4.0)
MCHC: 34.6 g/dL (ref 30.0–36.0)
MCV: 92.6 fl (ref 78.0–100.0)
MONO ABS: 0.6 10*3/uL (ref 0.1–1.0)
MONOS PCT: 10.3 % (ref 3.0–12.0)
Neutro Abs: 3 10*3/uL (ref 1.4–7.7)
Neutrophils Relative %: 54.4 % (ref 43.0–77.0)
Platelets: 184 10*3/uL (ref 150.0–400.0)
RBC: 4.26 Mil/uL (ref 3.87–5.11)
RDW: 14.6 % (ref 11.5–15.5)
WBC: 5.5 10*3/uL (ref 4.0–10.5)

## 2018-01-01 LAB — VITAMIN B12: VITAMIN B 12: 1124 pg/mL — AB (ref 211–911)

## 2018-01-01 LAB — TSH: TSH: 1.69 u[IU]/mL (ref 0.35–4.50)

## 2018-01-01 LAB — FOLATE: Folate: >23.6 ng/mL (ref >5.9)

## 2018-01-01 NOTE — Patient Instructions (Signed)
Ataxia °Ataxia is a condition that results in unsteadiness when walking and standing, poor coordination of body movements, and difficulty maintaining an upright posture. It occurs due to a problem with the part of your brain that controls coordination and stability (cerebellar dysfunction). °What are the causes? °Ataxia can develop later in life (acquired ataxia) during your 20s to 30s, and even as late as into your 60s or beyond. Acquired ataxia may be caused by: °· Changes in your nervous system (neurodegenerative). °· Changes throughout your body (systemic disorders). °· Excess exposure to: °? Medicines, such as phenytoin and lithium. °? Solvents. °? Abuse of alcohol (alcoholism). °· Medical conditions, such as: °? Celiac sprue. °? Hypothyroidism. °? Vitamin E deficiency. °? Structural brain abnormalities, such as tumors. °? Multiple sclerosis. °? Stroke. °? Head injury. ° °Ataxia may also be present early in life (non-acquired ataxia). There are two main types of non-acquired ataxia: °· Cerebellar dysfunction present at birth (congenital). °· Family inheritance (genetic heredity). Friedreich ataxia is the most common form of hereditary ataxia. ° °What are the signs or symptoms? °The signs and symptoms of ataxia can vary depending on how severe the condition is that causes it. Signs and symptoms may include: °· Unsteadiness. °· Walking with a wide stance. °· Tremor. °· Poorly coordinated body movements. °· Difficulty maintaining a straight (upright) posture. °· Fatigue. °· Changes in your speech. °· Changes in your vision. °· Difficulty swallowing. °· Difficulty with writing. °· Decreased mental status (dementia). °· Muscle spasms. ° °How is this diagnosed? °Ataxia is diagnosed by discussing your personal and family history and through a physical exam. You may also have additional tests such as: °· MRI. °· Genetic testing. ° °How is this treated? °Treatment for ataxia may include treating or removing the  underlying condition causing the ataxia. Surgery may be required if a structural abnormality in your brain is causing the ataxia. Otherwise, supportive treatments may be used to manage your symptoms. °Follow these instructions at home: °Monitor your ataxia for any changes. The following actions may help any discomfort you are experiencing: °· Do not drink alcohol. °· Lie down right away if you become very unsteady, dizzy, nauseated, or feel like you are going to faint. Wait until all of these feelings pass before you get up again. ° °Get help right away if: °· Your unsteadiness suddenly worsens. °· You develop severe headaches, chest pain, or abdominal pain. °· You have weakness or numbness on one side of your body. °· You have problems with your vision. °· You feel confused. °· You have difficulty speaking. °· You have an irregular heartbeat or a very fast pulse. °This information is not intended to replace advice given to you by your health care provider. Make sure you discuss any questions you have with your health care provider. °Document Released: 06/15/2014 Document Revised: 04/25/2016 Document Reviewed: 02/18/2014 °Elsevier Interactive Patient Education © 2018 [employer] ° °

## 2018-01-01 NOTE — Patient Instructions (Addendum)
Continue doing brain stimulating activities (puzzles, reading, adult coloring books, staying active) to keep memory sharp.   Continue to eat heart healthy diet (full of fruits, vegetables, whole grains, lean protein, water--limit salt, fat, and sugar intake) and increase physical activity as tolerated.   [name] , Thank you for taking time to come for your Medicare Wellness Visit. I appreciate your ongoing commitment to your health goals. Please review the following plan we discussed and let me know if I can assist you in the future.   These are the goals we discussed: Goals    . Patient Stated     Increase my physical activity as tolerated, start doing chair exercises. Enjoy life and family.       This is a list of the screening recommended for you and due dates:  Health Maintenance  Topic Date Due  . Colon Cancer Screening  07/27/2018  . Pneumonia vaccines (2 of 2 - PPSV23) 01/01/2019  . Tetanus Vaccine  04/06/2020  . Flu Shot  Completed  . DEXA scan (bone density measurement)  Completed     [name] , Thank you for taking time to come for your Medicare Wellness Visit. I appreciate your ongoing commitment to your health goals. Please review the following plan we discussed and let me know if I can assist you in the future.   These are the goals we discussed: Goals    None      This is a list of the screening recommended for you and due dates:  Health Maintenance  Topic Date Due  . Colon Cancer Screening  07/27/2018  . Pneumonia vaccines (2 of 2 - PPSV23) 01/01/2019  . Tetanus Vaccine  04/06/2020  . Flu Shot  Completed  . DEXA scan (bone density measurement)  Completed

## 2018-01-01 NOTE — Progress Notes (Signed)
Subjective:  Patient ID: Veronica Garza, female    DOB: 01-22-1941  Age: 77 y.o. MRN: [id number]  CC: Hypertension and Hyperlipidemia   HPI [name] presents for f/up - she complains of a 59-month history of gradual, onset intermittent dizziness, fatigue, and ataxia.  She is hearing impaired and wears hearing aids.  She also has a history of tinnitus.  She denies vertigo and denies any paresthesias.  Outpatient Medications Prior to Visit  Medication Sig Dispense Refill  . Calcium Carbonate-Vitamin D (CALTRATE 600+D) 600-400 MG-UNIT per tablet Take 1 tablet by mouth 2 (two) times daily.     . mirabegron ER (MYRBETRIQ) 50 MG TB24 tablet Take 50 mg by mouth daily.    Marland Kitchen PARoxetine (PAXIL) 10 MG tablet TAKE 1 TABLET DAILY 90 tablet 1  . Polyethyl Glycol-Propyl Glycol (SYSTANE OP) Apply 1 drop to eye as directed. Into both eyes four times a day for dry eyes    . TURMERIC PO Take 1 tablet by mouth daily. Take one by mouth daily    . PARoxetine (PAXIL) 10 MG tablet TAKE 1 TABLET DAILY 90 tablet 1  . Multiple Vitamins-Minerals (ICAPS AREDS 2 PO) Take by mouth.    Marland Kitchen albuterol (PROVENTIL HFA;VENTOLIN HFA) 108 (90 Base) MCG/ACT inhaler Inhale 2 puffs into the lungs every 6 (six) hours as needed for wheezing or shortness of breath. (Patient not taking: Reported on 10/17/2017) 1 Inhaler 0  . Alum Hydroxide-Mag Carbonate (GAVISCON EXTRA STRENGTH) 160-105 MG CHEW Chew 1 tablet by mouth 4 (four) times daily -  with meals and at bedtime. 100 tablet 5   No facility-administered medications prior to visit.     ROS Review of Systems  Constitutional: Positive for fatigue. Negative for appetite change, diaphoresis and unexpected weight change.  HENT: Positive for hearing loss and tinnitus. Negative for ear pain, facial swelling and trouble swallowing.   Eyes: Negative for photophobia, pain and visual disturbance.  Respiratory: Negative.  Negative for cough, chest tightness, shortness of breath and  wheezing.   Cardiovascular: Negative for chest pain, palpitations and leg swelling.  Gastrointestinal: Negative for abdominal pain, constipation, diarrhea, nausea and vomiting.  Endocrine: Negative.   Genitourinary: Negative.  Negative for decreased urine volume, difficulty urinating and urgency.  Musculoskeletal: Positive for gait problem. Negative for back pain, myalgias and neck pain.  Skin: Negative.   Allergic/Immunologic: Negative.   Neurological: Positive for dizziness. Negative for tremors, seizures, syncope, facial asymmetry, speech difficulty, weakness, light-headedness, numbness and headaches.  Hematological: Negative for adenopathy. Does not bruise/bleed easily.  Psychiatric/Behavioral: Negative.     Objective:  BP 138/90 (BP Location: Right Arm, Cuff Size: Large)   Pulse 95   Temp 97.8 F (36.6 C) (Oral)   Ht 4\' 11"  (1.499 m)   Wt 212 lb 1.9 oz (96.2 kg)   SpO2 95%   BMI 42.84 kg/m   BP Readings from Last 3 Encounters:  01/01/18 138/90  10/17/17 132/84  12/31/16 140/78    Wt Readings from Last 3 Encounters:  01/01/18 212 lb 1.9 oz (96.2 kg)  10/17/17 216 lb 3.2 oz (98.1 kg)  12/31/16 205 lb (93 kg)    Physical Exam  Constitutional: No distress.  HENT:  Head: Normocephalic and atraumatic.  Eyes: Conjunctivae and EOM are normal. Pupils are equal, round, and reactive to light. No scleral icterus.  Neck: Normal range of motion. Neck supple. No JVD present. No thyromegaly present.  Cardiovascular: Normal rate, regular rhythm and normal heart sounds. Exam  reveals no gallop.  No murmur heard. Pulmonary/Chest: Effort normal and breath sounds normal. No respiratory distress. She has no wheezes. She has no rales.  Abdominal: Soft. Bowel sounds are normal. She exhibits no distension and no mass. There is no tenderness. There is no guarding.  Musculoskeletal: She exhibits no edema, tenderness or deformity.  Lymphadenopathy:    She has no cervical adenopathy.    Neurological: She is alert. She has normal strength. She displays no atrophy, no tremor and normal reflexes. No cranial nerve deficit or sensory deficit. She exhibits normal muscle tone. She displays a negative Romberg sign. She displays no seizure activity. Coordination and gait abnormal. She displays no Babinski's sign on the right side. She displays no Babinski's sign on the left side.  Skin: Skin is warm and dry. No rash noted. She is not diaphoretic. No erythema. No pallor.  Psychiatric: Her behavior is normal. Judgment and thought content normal.  Vitals reviewed.   Lab Results  Component Value Date   WBC 5.5 01/01/2018   HGB 13.7 01/01/2018   HCT 39.5 01/01/2018   PLT 184.0 01/01/2018   GLUCOSE 112 (H) 01/01/2018   CHOL 179 01/01/2018   TRIG 139.0 01/01/2018   HDL 62.10 01/01/2018   LDLDIRECT 119.5 09/04/2012   LDLCALC 90 01/01/2018   ALT 22 01/01/2018   AST 22 01/01/2018   NA 140 01/01/2018   K 4.2 01/01/2018   CL 101 01/01/2018   CREATININE 0.74 01/01/2018   BUN 21 01/01/2018   CO2 31 01/01/2018   TSH 1.69 01/01/2018   INR 0.93 09/30/2011   HGBA1C 5.8 12/31/2016    Dg Chest 2 View  Result Date: 03/27/2016 CLINICAL DATA:  Syncope x 1 day EXAM: CHEST  2 VIEW COMPARISON:  06/21/2015 FINDINGS: There is no focal parenchymal opacity. There is no pleural effusion or pneumothorax. The heart and mediastinal contours are unremarkable. There are bilateral shoulder arthroplasties. IMPRESSION: No active cardiopulmonary disease. Electronically Signed   By: Kathreen Devoid   On: 03/27/2016 18:31   Ct Head Wo Contrast  Result Date: 03/27/2016 CLINICAL DATA:  Patient with syncopal episode.  Shortness of breath. EXAM: CT HEAD WITHOUT CONTRAST TECHNIQUE: Contiguous axial images were obtained from the base of the skull through the vertex without intravenous contrast. COMPARISON:  Brain CT 10/25/2014 FINDINGS: Ventricles and sulci are appropriate for patient's age. Bilateral basal ganglia  calcifications. No evidence for acute cortically based infarct, intracranial hemorrhage, mass lesion or mass-effect. Orbits are unremarkable. Paranasal sinuses are well aerated. Mastoid air cells unremarkable. Calvarium is intact. Small amount of fluid within the sphenoid sinus. IMPRESSION: No acute intracranial process. Small amount of fluid within the sphenoid sinus. Electronically Signed   By: Lovey Newcomer M.D.   On: 03/27/2016 19:27    Assessment & Plan:   Opel was seen today for hypertension and hyperlipidemia.  Diagnoses and all orders for this visit:  Essential hypertension, benign- Her blood pressure is well controlled. -     Comprehensive metabolic panel; Future -     CBC with Differential/Platelet; Future  Hyperlipidemia LDL goal <130- She has an elevated ASCVD risk score so I have asked her to start in a statin for CV risk reduction. -     Lipid panel; Future -     TSH; Future  Morbid obesity ([place])- She is working on her lifestyle modifications to reduce her weight.  Ataxia- I will check her labs to screen for vitamin deficiencies and metabolic causes.  Will also scan  her brain to see if there is NPH, tumor, masses, or CVA. -     Vitamin B12; Future -     MR Brain Wo Contrast; Future -     Folate; Future  Cerebellar ataxia in diseases classified elsewhere (HCC)-as above -     Vitamin B12; Future -     MR Brain Wo Contrast; Future -     Folate; Future  Need for vaccination against Streptococcus pneumoniae -     Pneumococcal conjugate vaccine 13-valent   I have discontinued Kylina B. Suriano's albuterol and Alum Hydroxide-Mag Carbonate. I am also having her maintain her Calcium Carbonate-Vitamin D, Polyethyl Glycol-Propyl Glycol (SYSTANE OP), TURMERIC PO, mirabegron ER, PARoxetine, and Multiple Vitamins-Minerals (ICAPS AREDS 2 PO).  No orders of the defined types were placed in this encounter.    Follow-up: No Follow-up on file.  Scarlette Calico, MD

## 2018-01-02 ENCOUNTER — Encounter: Payer: Self-pay | Admitting: Internal Medicine

## 2018-01-04 MED ORDER — ATORVASTATIN CALCIUM 10 MG PO TABS
10.0000 mg | ORAL_TABLET | Freq: Every day | ORAL | 1 refills | Status: DC
Start: 2018-01-04 — End: 2018-03-31

## 2018-01-08 DIAGNOSIS — H04123 Dry eye syndrome of bilateral lacrimal glands: Secondary | ICD-10-CM | POA: Diagnosis not present

## 2018-01-08 DIAGNOSIS — Z961 Presence of intraocular lens: Secondary | ICD-10-CM | POA: Diagnosis not present

## 2018-01-08 DIAGNOSIS — H524 Presbyopia: Secondary | ICD-10-CM | POA: Diagnosis not present

## 2018-01-08 DIAGNOSIS — H353131 Nonexudative age-related macular degeneration, bilateral, early dry stage: Secondary | ICD-10-CM | POA: Diagnosis not present

## 2018-01-19 ENCOUNTER — Encounter: Payer: Self-pay | Admitting: Internal Medicine

## 2018-01-19 NOTE — Telephone Encounter (Signed)
This encounter was created in error - please disregard.

## 2018-01-19 NOTE — Telephone Encounter (Signed)
Pt called and inquired about lab results. She does not get on mychart often. She was told results but wanted to know why she was put on LIPITOR, please call back in regard, also I mailed her a paper coy of her results.

## 2018-01-20 ENCOUNTER — Other Ambulatory Visit: Payer: Self-pay | Admitting: Internal Medicine

## 2018-01-21 ENCOUNTER — Ambulatory Visit
Admission: RE | Admit: 2018-01-21 | Discharge: 2018-01-21 | Disposition: A | Discharge status: Home / Self Care | Payer: Medicare Other | Source: Ambulatory Visit | Attending: Internal Medicine | Admitting: Internal Medicine

## 2018-01-21 ENCOUNTER — Encounter: Payer: Self-pay | Admitting: Internal Medicine

## 2018-01-21 DIAGNOSIS — R42 Dizziness and giddiness: Secondary | ICD-10-CM | POA: Diagnosis not present

## 2018-01-21 DIAGNOSIS — R270 Ataxia, unspecified: Secondary | ICD-10-CM

## 2018-01-21 DIAGNOSIS — G3281 Cerebellar ataxia in diseases classified elsewhere: Secondary | ICD-10-CM

## 2018-02-02 ENCOUNTER — Other Ambulatory Visit: Payer: Self-pay | Admitting: Internal Medicine

## 2018-02-02 NOTE — Telephone Encounter (Signed)
Pt called and requested MRI results. Results were given.   Pt would like to know what she can do about the dizziness that she is still having. Please advise.

## 2018-02-18 DIAGNOSIS — H903 Sensorineural hearing loss, bilateral: Secondary | ICD-10-CM | POA: Diagnosis not present

## 2018-02-18 DIAGNOSIS — R2689 Other abnormalities of gait and mobility: Secondary | ICD-10-CM | POA: Diagnosis not present

## 2018-02-18 DIAGNOSIS — D490 Neoplasm of unspecified behavior of digestive system: Secondary | ICD-10-CM | POA: Diagnosis not present

## 2018-02-18 DIAGNOSIS — L299 Pruritus, unspecified: Secondary | ICD-10-CM | POA: Diagnosis not present

## 2018-02-24 ENCOUNTER — Encounter: Payer: Self-pay | Admitting: Gastroenterology

## 2018-03-31 ENCOUNTER — Encounter: Payer: Self-pay | Admitting: Gastroenterology

## 2018-03-31 ENCOUNTER — Ambulatory Visit (INDEPENDENT_AMBULATORY_CARE_PROVIDER_SITE_OTHER): Payer: Medicare Other | Admitting: Gastroenterology

## 2018-03-31 VITALS — BP 138/86 | HR 76 | Ht 59.0 in | Wt 216.4 lb

## 2018-03-31 DIAGNOSIS — Z1211 Encounter for screening for malignant neoplasm of colon: Secondary | ICD-10-CM | POA: Diagnosis not present

## 2018-03-31 DIAGNOSIS — K219 Gastro-esophageal reflux disease without esophagitis: Secondary | ICD-10-CM | POA: Diagnosis not present

## 2018-03-31 DIAGNOSIS — R1310 Dysphagia, unspecified: Secondary | ICD-10-CM

## 2018-03-31 MED ORDER — FAMOTIDINE 40 MG PO TABS: 40.0000 mg | ORAL_TABLET | Freq: Every day | ORAL | 3 refills | Status: DC

## 2018-03-31 MED ORDER — OMEPRAZOLE 40 MG PO CPDR: 40.0000 mg | DELAYED_RELEASE_CAPSULE | Freq: Every day | ORAL | 3 refills | Status: DC

## 2018-03-31 NOTE — Patient Instructions (Signed)
You have been scheduled for a Barium Esophogram at [facility] (1st floor of the hospital) on 04/03/2018 at 1:30pm. Please arrive 15 minutes prior to your appointment for registration. Make certain not to have anything to eat or drink 6 hours prior to your test. If you need to reschedule for any reason, please contact radiology at [phone] to do so. __________________________________________________________________ A barium swallow is an examination that concentrates on views of the esophagus. This tends to be a double contrast exam (barium and two liquids which, when combined, create a gas to distend the wall of the oesophagus) or single contrast (non-ionic iodine based). The study is usually tailored to your symptoms so a good history is essential. Attention is paid during the study to the form, structure and configuration of the esophagus, looking for functional disorders (such as aspiration, dysphagia, achalasia, motility and reflux) EXAMINATION You may be asked to change into a gown, depending on the type of swallow being performed. A radiologist and radiographer will perform the procedure. The radiologist will advise you of the type of contrast selected for your procedure and direct you during the exam. You will be asked to stand, sit or lie in several different positions and to hold a small amount of fluid in your mouth before being asked to swallow while the imaging is performed .In some instances you may be asked to swallow barium coated marshmallows to assess the motility of a solid food bolus. The exam can be recorded as a digital or video fluoroscopy procedure. POST PROCEDURE It will take 1-2 days for the barium to pass through your system. To facilitate this, it is important, unless otherwise directed, to increase your fluids for the next 24-48hrs and to resume your normal diet.  This test typically takes about 30 minutes to perform.    Gastroesophageal Reflux Disease,  Adult Normally, food travels down the esophagus and stays in the stomach to be digested. However, when a person has gastroesophageal reflux disease (GERD), food and stomach acid move back up into the esophagus. When this happens, the esophagus becomes sore and inflamed. Over time, GERD can create small holes (ulcers) in the lining of the esophagus. What are the causes? This condition is caused by a problem with the muscle between the esophagus and the stomach (lower esophageal sphincter, or LES). Normally, the LES muscle closes after food passes through the esophagus to the stomach. When the LES is weakened or abnormal, it does not close properly, and that allows food and stomach acid to go back up into the esophagus. The LES can be weakened by certain dietary substances, medicines, and medical conditions, including:  Tobacco use.  Pregnancy.  Having a hiatal hernia.  Heavy alcohol use.  Certain foods and beverages, such as coffee, chocolate, onions, and peppermint.  What increases the risk? This condition is more likely to develop in:  People who have an increased body weight.  People who have connective tissue disorders.  People who use NSAID medicines.  What are the signs or symptoms? Symptoms of this condition include:  Heartburn.  Difficult or painful swallowing.  The feeling of having a lump in the throat.  Abitter taste in the mouth.  Bad breath.  Having a large amount of saliva.  Having an upset or bloated stomach.  Belching.  Chest pain.  Shortness of breath or wheezing.  Ongoing (chronic) cough or a night-time cough.  Wearing away of tooth enamel.  Weight loss.  Different conditions can cause chest pain.  Make sure to see your health care provider if you experience chest pain. How is this diagnosed? Your health care provider will take a medical history and perform a physical exam. To determine if you have mild or severe GERD, your health care provider  may also monitor how you respond to treatment. You may also have other tests, including:  An endoscopy toexamine your stomach and esophagus with a small camera.  A test thatmeasures the acidity level in your esophagus.  A test thatmeasures how much pressure is on your esophagus.  A barium swallow or modified barium swallow to show the shape, size, and functioning of your esophagus.  How is this treated? The goal of treatment is to help relieve your symptoms and to prevent complications. Treatment for this condition may vary depending on how severe your symptoms are. Your health care provider may recommend:  Changes to your diet.  Medicine.  Surgery.  Follow these instructions at home: Diet  Follow a diet as recommended by your health care provider. This may involve avoiding foods and drinks such as: ? Coffee and tea (with or without caffeine). ? Drinks that containalcohol. ? Energy drinks and sports drinks. ? Carbonated drinks or sodas. ? Chocolate and cocoa. ? Peppermint and mint flavorings. ? Garlic and onions. ? Horseradish. ? Spicy and acidic foods, including peppers, chili powder, curry powder, vinegar, hot sauces, and barbecue sauce. ? Citrus fruit juices and citrus fruits, such as oranges, lemons, and limes. ? Tomato-based foods, such as red sauce, chili, salsa, and pizza with red sauce. ? Fried and fatty foods, such as donuts, french fries, potato chips, and high-fat dressings. ? High-fat meats, such as hot dogs and fatty cuts of red and white meats, such as rib eye steak, sausage, ham, and bacon. ? High-fat dairy items, such as whole milk, butter, and cream cheese.  Eat small, frequent meals instead of large meals.  Avoid drinking large amounts of liquid with your meals.  Avoid eating meals during the 2-3 hours before bedtime.  Avoid lying down right after you eat.  Do not exercise right after you eat. General instructions  Pay attention to any changes in  your symptoms.  Take over-the-counter and prescription medicines only as told by your health care provider. Do not take aspirin, ibuprofen, or other NSAIDs unless your health care provider told you to do so.  Do not use any tobacco products, including cigarettes, chewing tobacco, and e-cigarettes. If you need help quitting, ask your health care provider.  Wear loose-fitting clothing. Do not wear anything tight around your waist that causes pressure on your abdomen.  Raise (elevate) the head of your bed 6 inches (15cm).  Try to reduce your stress, such as with yoga or meditation. If you need help reducing stress, ask your health care provider.  If you are overweight, reduce your weight to an amount that is healthy for you. Ask your health care provider for guidance about a safe weight loss goal.  Keep all follow-up visits as told by your health care provider. This is important. Contact a health care provider if:  You have new symptoms.  You have unexplained weight loss.  You have difficulty swallowing, or it hurts to swallow.  You have wheezing or a persistent cough.  Your symptoms do not improve with treatment.  You have a hoarse voice. Get help right away if:  You have pain in your arms, neck, jaw, teeth, or back.  You feel sweaty, dizzy, or light-headed.  You have chest pain or shortness of breath.  You vomit and your vomit looks like blood or coffee grounds.  You faint.  Your stool is bloody or black.  You cannot swallow, drink, or eat. This information is not intended to replace advice given to you by your health care provider. Make sure you discuss any questions you have with your health care provider. Document Released: 08/28/2005 Document Revised: 04/17/2016 Document Reviewed: 03/15/2015 Elsevier Interactive Patient Education  [employer].  __________________________________________________________________________________

## 2018-03-31 NOTE — Progress Notes (Signed)
Veronica Garza    [id number]    15-Dec-1940  Primary Care Physician:Jones, Arvid Right, MD  Referring Physician: Janith Lima, MD [address] [place], [place] [address]  Chief complaint:  Dysphagia  HPI:  77 year old female previously followed by Dr. Deatra Ina, last seen by Nicoletta Ba in October 2012 is here to reestablish care. She had a history of GERD and esophageal stricture She cant drink fast, feel water gets hung up and goes down very slowly. She is able to swallow mostly soft foods, chokes when she tries to eat anything more crunchy like vegetables, meat, bread or even rice. She has to belch a lot sometimes before she can eat or drink. She has heartburn almost daily, worse at bedtime.  Takes Pepcid over-the-counter as needed. No vomiting Bowel habits are regular with daily bowel movement, denies any blood in stool or bright red blood per rectum. EGD September 13, 2011: Distal esophageal stricture status post Venia Minks dilation to 18 mm EGD February 2010 with findings suggestive of esophageal stricture status post dilation with [facility] dilator to 18 mm Flexible sigmoidoscopy September 27, 2008 with random left colon biopsies and EGD with small bowel biopsies normal Colonoscopy August 2009: Normal EGD August 2009: Distal esophageal stricture balloon dilation 15 to 18 mm No family history of colon cancer  Outpatient Encounter Medications as of 03/31/2018  Medication Sig  . Calcium Carbonate-Vitamin D (CALTRATE 600+D) 600-400 MG-UNIT per tablet Take 1 tablet by mouth 2 (two) times daily.   . mirabegron ER (MYRBETRIQ) 50 MG TB24 tablet Take 50 mg by mouth daily.  . Multiple Vitamins-Minerals (ICAPS AREDS 2 PO) Take by mouth.  Marland Kitchen PARoxetine (PAXIL) 10 MG tablet TAKE 1 TABLET DAILY  . Polyethyl Glycol-Propyl Glycol (SYSTANE OP) Apply 1 drop to eye as directed. Into both eyes four times a day for dry eyes  . TURMERIC PO Take 1 tablet by mouth daily. Take one by  mouth daily  . [DISCONTINUED] atorvastatin (LIPITOR) 10 MG tablet Take 1 tablet (10 mg total) by mouth daily.   No facility-administered encounter medications on file as of 03/31/2018.     Allergies as of 03/31/2018 - Review Complete 03/31/2018  Allergen Reaction Noted  . Iohexol Hives 09/12/2008  . Naproxen Itching and Rash 09/26/2008  . Neomycin-bacitracin zn-polymyx Itching and Rash 09/26/2008  . Sulfonamide derivatives Itching and Rash 09/26/2008    Past Medical History:  Diagnosis Date  . Anemia   . Arthritis   . Cataract   . GERD (gastroesophageal reflux disease)   . Hx of shoulder replacement    lt  . Hypertension   . OSA (obstructive sleep apnea)   . Pneumonia    History of  . Urinary incontinence     Past Surgical History:  Procedure Laterality Date  . ABDOMINAL HYSTERECTOMY    . EYE SURGERY     bilateral cataract extraction  . NASAL SINUS SURGERY    . SHOULDER ARTHROSCOPY     Left  . TOTAL SHOULDER ARTHROPLASTY  10/10/2011   Procedure: TOTAL SHOULDER ARTHROPLASTY;  Surgeon: Metta Clines Supple;  Location: [place];  Service: Orthopedics;  Laterality: Right;  . UPPER GASTROINTESTINAL ENDOSCOPY    . Vaginal Sling      Family History  Problem Relation Age of Onset  . Cancer Father        Prostate cancer  . Cancer Sister        Ovarian cancer  .  Diabetes Sister   . Alzheimer's disease Mother   . COPD Neg Hx   . Alcohol abuse Neg Hx   . Early death Neg Hx   . Heart disease Neg Hx   . Hyperlipidemia Neg Hx   . Hypertension Neg Hx   . Kidney disease Neg Hx   . Stroke Neg Hx     Social History   Socioeconomic History  . Marital status: Married    Spouse name: Not on file  . Number of children: 1  . Years of education: Not on file  . Highest education level: Not on file  Occupational History  . Occupation: Retired  Scientific laboratory technician  . Financial resource strain: Not hard at all  . Food insecurity:    Worry: Never true    Inability: Never true  .  Transportation needs:    Medical: No    Non-medical: No  Tobacco Use  . Smoking status: Never Smoker  . Smokeless tobacco: Never Used  Substance and Sexual Activity  . Alcohol use: No  . Drug use: No  . Sexual activity: Not Currently  Lifestyle  . Physical activity:    Days per week: Not on file    Minutes per session: Not on file  . Stress: Not on file  Relationships  . Social connections:    Talks on phone: Not on file    Gets together: Not on file    Attends religious service: Not on file    Active member of club or organization: Not on file    Attends meetings of clubs or organizations: Not on file    Relationship status: Not on file  . Intimate partner violence:    Fear of current or ex partner: Not on file    Emotionally abused: Not on file    Physically abused: Not on file    Forced sexual activity: Not on file  Other Topics Concern  . 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  HENT: Negative.   Eyes: Negative for blurred vision.  Respiratory: Negative for cough, shortness of breath and wheezing.   Cardiovascular: Negative for chest pain and palpitations.  Gastrointestinal: as per HPI Genitourinary: Negative for dysuria, urgency, frequency and hematuria.  Musculoskeletal: positive for myalgias, back pain and joint pain.  Skin: Negative for itching and rash.  Neurological: Negative for dizziness, tremors, focal weakness, seizures and loss of consciousness.  Endo/Heme/Allergies: Positive for seasonal allergies.  Psychiatric/Behavioral: Negative for depression, suicidal ideas and hallucinations.  All other systems reviewed and are negative.   Physical Exam: Vitals:   03/31/18 1324  BP: 138/86  Pulse: 76   Body mass index is 43.71 kg/m. Gen:      No acute distress HEENT:  EOMI, sclera anicteric Neck:     No masses;  no thyromegaly Lungs:    Clear to auscultation bilaterally; normal respiratory effort CV:         Regular rate and rhythm; no murmurs Abd:      + bowel sounds; soft, non-tender; no palpable masses, no distension Ext:    No edema; adequate peripheral perfusion Skin:      Warm and dry; no rash Neuro: alert and oriented x 3 Psych: normal mood and affect  Data Reviewed:  Reviewed labs, radiology imaging, old records and pertinent past GI work  up   Assessment and Plan/Recommendations:  77 year old female with history of obesity, obstructive sleep apnea, esophageal stricture, GERD status post multiple EGD with esophageal dilation here with complaints of almost daily heartburn and worsening dysphagia  Start omeprazole 40 mg daily, 30 minutes before breakfast Continue Pepcid at bedtime as needed Discussed in detail antireflux measures and lifestyle modifications Continue soft diet Small frequent meals  We will obtain barium esophagram to evaluate, if has evidence of esophageal stricture, will proceed with EGD for possible esophageal dilation  Due for colorectal cancer screening August 2019.  Patient is reluctant to undergo colonoscopy Discussed alternative modality for colorectal cancer screening, Cologuard.  She is hesitant about that as well and will let us know if she wants to proceed with it.   Greater than 50% of the time used for counseling as well as treatment plan and follow-up. She had multiple questions which were answered to her satisfaction  K. Denzil Magnuson , MD [phone]    CC: Janith Lima, MD

## 2018-04-03 ENCOUNTER — Ambulatory Visit (HOSPITAL_COMMUNITY)
Admission: RE | Admit: 2018-04-03 | Discharge: 2018-04-03 | Disposition: A | Discharge status: Home / Self Care | Payer: Medicare Other | Source: Ambulatory Visit | Attending: Gastroenterology | Admitting: Gastroenterology

## 2018-04-03 DIAGNOSIS — R05 Cough: Secondary | ICD-10-CM | POA: Diagnosis not present

## 2018-04-03 DIAGNOSIS — K224 Dyskinesia of esophagus: Secondary | ICD-10-CM | POA: Insufficient documentation

## 2018-04-03 DIAGNOSIS — K449 Diaphragmatic hernia without obstruction or gangrene: Secondary | ICD-10-CM | POA: Diagnosis not present

## 2018-04-03 DIAGNOSIS — K219 Gastro-esophageal reflux disease without esophagitis: Secondary | ICD-10-CM | POA: Diagnosis not present

## 2018-04-03 DIAGNOSIS — R1310 Dysphagia, unspecified: Secondary | ICD-10-CM | POA: Diagnosis not present

## 2018-04-03 DIAGNOSIS — J40 Bronchitis, not specified as acute or chronic: Secondary | ICD-10-CM | POA: Diagnosis not present

## 2018-04-13 ENCOUNTER — Ambulatory Visit (INDEPENDENT_AMBULATORY_CARE_PROVIDER_SITE_OTHER)
Admission: RE | Admit: 2018-04-13 | Discharge: 2018-04-13 | Disposition: A | Discharge status: Home / Self Care | Payer: Medicare Other | Source: Ambulatory Visit | Attending: Family | Admitting: Family

## 2018-04-13 ENCOUNTER — Encounter: Payer: Self-pay | Admitting: Family

## 2018-04-13 ENCOUNTER — Ambulatory Visit (INDEPENDENT_AMBULATORY_CARE_PROVIDER_SITE_OTHER): Payer: Medicare Other | Admitting: Family

## 2018-04-13 VITALS — BP 146/80 | HR 73 | Temp 98.2°F | Ht 59.0 in | Wt 213.0 lb

## 2018-04-13 DIAGNOSIS — R059 Cough, unspecified: Secondary | ICD-10-CM

## 2018-04-13 DIAGNOSIS — R05 Cough: Secondary | ICD-10-CM | POA: Diagnosis not present

## 2018-04-13 DIAGNOSIS — J209 Acute bronchitis, unspecified: Secondary | ICD-10-CM

## 2018-04-13 MED ORDER — LEVOFLOXACIN 500 MG PO TABS: 500.0000 mg | ORAL_TABLET | Freq: Every day | ORAL | 0 refills | Status: DC

## 2018-04-13 MED ORDER — HYDROCODONE-HOMATROPINE 5-1.5 MG/5ML PO SYRP: 5.0000 mL | ORAL_SOLUTION | Freq: Three times a day (TID) | ORAL | 0 refills | Status: DC | PRN

## 2018-04-13 MED ORDER — ALBUTEROL SULFATE (2.5 MG/3ML) 0.083% IN NEBU
2.5000 mg | INHALATION_SOLUTION | Freq: Once | RESPIRATORY_TRACT | Status: AC
  Administered 2018-04-13: 2.5 mg via RESPIRATORY_TRACT

## 2018-04-13 NOTE — Addendum Note (Signed)
Addended by: Marcina Millard on: 04/13/2018 02:42 PM   Modules accepted: Orders

## 2018-04-13 NOTE — Progress Notes (Signed)
Veronica Garza is a 77 y.o. female with the following history as recorded in EpicCare:  Patient Active Problem List   Diagnosis Date Noted  . Ataxia 01/01/2018  . Morbid obesity ([place]) 10/17/2017  . 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 Hot flashes, menopausal 12/31/2016  . DDD (degenerative disc disease), lumbar 11/06/2015  . Right-sided low back pain without sciatica 06/21/2015  . Diastolic dysfunction without heart failure 11/13/2014  . Other abnormal glucose 05/27/2013  . Osteopenia of the elderly 05/27/2013  . Hyperlipidemia LDL goal <130 09/04/2012  . Esophageal stricture 09/12/2011  . Visit for screening mammogram 08/08/2011  . Routine general medical examination at a health care facility 08/08/2011  . Essential hypertension, benign 01/14/2011  . URINARY INCONTINENCE 12/14/2008  . OA (osteoarthritis) 07/21/2008  . Obstructive sleep apnea 10/14/2007  . GERD 10/14/2007    Current Outpatient Medications  Medication Sig Dispense Refill  . benzonatate (TESSALON PERLES) 100 MG capsule Take by mouth.    . Calcium Carbonate-Vitamin D (CALTRATE 600+D) 600-400 MG-UNIT per tablet Take 1 tablet by mouth 2 (two) times daily.     . famotidine (PEPCID) 40 MG tablet Take 1 tablet (40 mg total) by mouth at bedtime. 30 tablet 3  . HYDROcodone-homatropine (HYCODAN) 5-1.5 MG/5ML syrup Take 5 mLs by mouth every 8 (eight) hours as needed for cough. 75 mL 0  . levofloxacin (LEVAQUIN) 500 MG tablet Take 1 tablet (500 mg total) by mouth daily. 10 tablet 0  . mirabegron ER (MYRBETRIQ) 50 MG TB24 tablet Take 50 mg by mouth daily.    . Multiple Vitamins-Minerals (ICAPS AREDS 2 PO) Take by mouth.    Marland Kitchen omeprazole (PRILOSEC) 40 MG capsule Take 1 capsule (40 mg total) by mouth daily. 90 capsule 3  . PARoxetine (PAXIL) 10 MG tablet TAKE 1 TABLET DAILY 90 tablet 1  . Polyethyl Glycol-Propyl Glycol (SYSTANE OP) Apply 1 drop to eye as directed. Into both eyes four times a day for dry eyes    . TURMERIC  PO Take 1 tablet by mouth daily. Take one by mouth daily     No current facility-administered medications for this visit.     Allergies: Iohexol; Naproxen; Neomycin-bacitracin zn-polymyx; and Sulfonamide derivatives  Past Medical History:  Diagnosis Date  . Anemia   . Arthritis   . Cataract   . GERD (gastroesophageal reflux disease)   . Hx of shoulder replacement    lt  . Hypertension   . OSA (obstructive sleep apnea)   . Pneumonia    History of  . Urinary incontinence     Past Surgical History:  Procedure Laterality Date  . ABDOMINAL HYSTERECTOMY    . EYE SURGERY     bilateral cataract extraction  . NASAL SINUS SURGERY    . SHOULDER ARTHROSCOPY     Left  . TOTAL SHOULDER ARTHROPLASTY  10/10/2011   Procedure: TOTAL SHOULDER ARTHROPLASTY;  Surgeon: Metta Clines Supple;  Location: [place];  Service: Orthopedics;  Laterality: Right;  . UPPER GASTROINTESTINAL ENDOSCOPY    . Vaginal Sling      Family History  Problem Relation Age of Onset  . Cancer Father        Prostate cancer  . Cancer Sister        Ovarian cancer  . Diabetes Sister   . Alzheimer's disease Mother   . COPD Neg Hx   . Alcohol abuse Neg Hx   . Early death Neg Hx   . Heart disease Neg Hx   .  Hyperlipidemia Neg Hx   . Hypertension Neg Hx   . Kidney disease Neg Hx   . Stroke Neg Hx     Social History   Tobacco Use  . Smoking status: Never Smoker  . Smokeless tobacco: Never Used  Substance Use Topics  . Alcohol use: No    Subjective:  Patient presents with concerns for 2 week history of persisting cough/ congestion; has completed 10 day course of Amoxicillin with no improvement in symptoms; + "rattling in my chest." + hurts to breathe; has not had CXR; + wheezing; prone to bronchitis;   Objective:  Vitals:   04/13/18 1329  BP: (!) 146/80  Pulse: 73  Temp: 98.2 F (36.8 C)  TempSrc: Oral  SpO2: 96%  Weight: 213 lb (96.6 kg)  Height: 4\' 11"  (1.499 m)    General: Well developed, well nourished, in  no acute distress  Skin : Warm and dry.  Head: Normocephalic and atraumatic  Eyes: Sclera and conjunctiva clear; pupils round and reactive to light; extraocular movements intact  Ears: External normal; canals clear; tympanic membranes normal  Oropharynx: Pink, supple. No suspicious lesions  Neck: Supple without thyromegaly, adenopathy  Lungs: Respirations unlabored; coarse breath sounds noted in all 4 lobes; improvement after breathing treatment given;  CVS exam: normal rate and regular rhythm.  Vessels: Symmetric bilaterally  Neurologic: Alert and oriented; speech intact; face symmetrical; moves all extremities well; CNII-XII intact without focal deficit   Assessment:  1. Cough   2. Acute bronchitis, unspecified organism     Plan:  Update CXR today; albuterol nebulizer treatment given today with improvement; Rx for Levaquin 500 mg qd x 10 days; sample of Symbicort 160/4.5 2 puffs bid x 2 weeks; Rx for Hycodan cough syrup; increase fluids, rest and follow-up in 2 weeks, sooner prn.   Return in about 2 weeks (around 04/27/2018) for follow-up.  Orders Placed This Encounter  Procedures  . DG Chest 2 View    Standing Status:   Future    Number of Occurrences:   1    Standing Expiration Date:   06/14/2019    Order Specific Question:   Reason for Exam (SYMPTOM  OR DIAGNOSIS REQUIRED)    Answer:   cough    Order Specific Question:   Preferred imaging location?    Answer:   [facility]    Order Specific Question:   Radiology Contrast Protocol - do NOT remove file path    Answer:   [email or web address]    Requested Prescriptions   Signed Prescriptions Disp Refills  . levofloxacin (LEVAQUIN) 500 MG tablet 10 tablet 0    Sig: Take 1 tablet (500 mg total) by mouth daily.  Marland Kitchen HYDROcodone-homatropine (HYCODAN) 5-1.5 MG/5ML syrup 75 mL 0    Sig: Take 5 mLs by mouth every 8 (eight) hours as needed for cough.

## 2018-04-14 NOTE — Progress Notes (Signed)
(  FYI) Spoke with patient and info given. She still feels about the same. Would you like for me to check back in on her maybe Thursday or Friday?

## 2018-04-17 ENCOUNTER — Encounter: Payer: Self-pay | Admitting: Family

## 2018-04-17 ENCOUNTER — Telehealth: Payer: Self-pay | Admitting: Family

## 2018-04-17 ENCOUNTER — Ambulatory Visit (INDEPENDENT_AMBULATORY_CARE_PROVIDER_SITE_OTHER): Payer: Medicare Other | Admitting: Family

## 2018-04-17 VITALS — BP 140/70 | HR 68 | Temp 98.0°F | Ht 59.0 in | Wt 211.2 lb

## 2018-04-17 DIAGNOSIS — R062 Wheezing: Secondary | ICD-10-CM

## 2018-04-17 DIAGNOSIS — I517 Cardiomegaly: Secondary | ICD-10-CM

## 2018-04-17 DIAGNOSIS — J209 Acute bronchitis, unspecified: Secondary | ICD-10-CM

## 2018-04-17 MED ORDER — ALBUTEROL SULFATE (2.5 MG/3ML) 0.083% IN NEBU
2.5000 mg | INHALATION_SOLUTION | Freq: Once | RESPIRATORY_TRACT | Status: AC
  Administered 2018-04-17: 2.5 mg via RESPIRATORY_TRACT

## 2018-04-17 MED ORDER — PREDNISONE 20 MG PO TABS: 40.0000 mg | ORAL_TABLET | Freq: Every day | ORAL | 0 refills | Status: AC

## 2018-04-17 MED ORDER — ALBUTEROL SULFATE HFA 108 (90 BASE) MCG/ACT IN AERS: 2.0000 | INHALATION_SPRAY | Freq: Four times a day (QID) | RESPIRATORY_TRACT | 0 refills | Status: DC | PRN

## 2018-04-17 NOTE — Telephone Encounter (Signed)
Can you call and check on her on Monday am please? If no improvement, I am going to order Chest Ct;

## 2018-04-17 NOTE — Patient Instructions (Signed)
We will call and check on you on Monday;

## 2018-04-17 NOTE — Progress Notes (Signed)
Veronica Garza is a 77 y.o. female with the following history as recorded in EpicCare:  Patient Active Problem List   Diagnosis Date Noted  . Ataxia 01/01/2018  . Morbid obesity ([place]) 10/17/2017  . 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 Hot flashes, menopausal 12/31/2016  . DDD (degenerative disc disease), lumbar 11/06/2015  . Right-sided low back pain without sciatica 06/21/2015  . Diastolic dysfunction without heart failure 11/13/2014  . Other abnormal glucose 05/27/2013  . Osteopenia of the elderly 05/27/2013  . Hyperlipidemia LDL goal <130 09/04/2012  . Esophageal stricture 09/12/2011  . Visit for screening mammogram 08/08/2011  . Routine general medical examination at a health care facility 08/08/2011  . Essential hypertension, benign 01/14/2011  . URINARY INCONTINENCE 12/14/2008  . OA (osteoarthritis) 07/21/2008  . Obstructive sleep apnea 10/14/2007  . GERD 10/14/2007    Current Outpatient Medications  Medication Sig Dispense Refill  . benzonatate (TESSALON PERLES) 100 MG capsule Take by mouth.    . Calcium Carbonate-Vitamin D (CALTRATE 600+D) 600-400 MG-UNIT per tablet Take 1 tablet by mouth 2 (two) times daily.     . famotidine (PEPCID) 40 MG tablet Take 1 tablet (40 mg total) by mouth at bedtime. 30 tablet 3  . HYDROcodone-homatropine (HYCODAN) 5-1.5 MG/5ML syrup Take 5 mLs by mouth every 8 (eight) hours as needed for cough. 75 mL 0  . levofloxacin (LEVAQUIN) 500 MG tablet Take 1 tablet (500 mg total) by mouth daily. 10 tablet 0  . mirabegron ER (MYRBETRIQ) 50 MG TB24 tablet Take 50 mg by mouth daily.    . Multiple Vitamins-Minerals (ICAPS AREDS 2 PO) Take by mouth.    Marland Kitchen omeprazole (PRILOSEC) 40 MG capsule Take 1 capsule (40 mg total) by mouth daily. 90 capsule 3  . PARoxetine (PAXIL) 10 MG tablet TAKE 1 TABLET DAILY 90 tablet 1  . Polyethyl Glycol-Propyl Glycol (SYSTANE OP) Apply 1 drop to eye as directed. Into both eyes four times a day for dry eyes    . TURMERIC  PO Take 1 tablet by mouth daily. Take one by mouth daily    . albuterol (PROVENTIL HFA;VENTOLIN HFA) 108 (90 Base) MCG/ACT inhaler Inhale 2 puffs into the lungs every 6 (six) hours as needed for wheezing or shortness of breath. 1 Inhaler 0  . predniSONE (DELTASONE) 20 MG tablet Take 2 tablets (40 mg total) by mouth daily with breakfast for 5 days. 10 tablet 0   No current facility-administered medications for this visit.     Allergies: Iohexol; Naproxen; Neomycin-bacitracin zn-polymyx; and Sulfonamide derivatives  Past Medical History:  Diagnosis Date  . Anemia   . Arthritis   . Cataract   . GERD (gastroesophageal reflux disease)   . Hx of shoulder replacement    lt  . Hypertension   . OSA (obstructive sleep apnea)   . Pneumonia    History of  . Urinary incontinence     Past Surgical History:  Procedure Laterality Date  . ABDOMINAL HYSTERECTOMY    . EYE SURGERY     bilateral cataract extraction  . NASAL SINUS SURGERY    . SHOULDER ARTHROSCOPY     Left  . TOTAL SHOULDER ARTHROPLASTY  10/10/2011   Procedure: TOTAL SHOULDER ARTHROPLASTY;  Surgeon: Metta Clines Supple;  Location: [place];  Service: Orthopedics;  Laterality: Right;  . UPPER GASTROINTESTINAL ENDOSCOPY    . Vaginal Sling      Family History  Problem Relation Age of Onset  . Cancer Father  Prostate cancer  . Cancer Sister        Ovarian cancer  . Diabetes Sister   . Alzheimer's disease Mother   . COPD Neg Hx   . Alcohol abuse Neg Hx   . Early death Neg Hx   . Heart disease Neg Hx   . Hyperlipidemia Neg Hx   . Hypertension Neg Hx   . Kidney disease Neg Hx   . Stroke Neg Hx     Social History   Tobacco Use  . Smoking status: Never Smoker  . Smokeless tobacco: Never Used  Substance Use Topics  . Alcohol use: No    Subjective:  4 day follow-up on acute bronchitis/ laryngitis; CXR done earlier this week did not show pneumonia; notes she is just not feeling any better- continuing to "hear a rattle in her  chest" even with Symbicort bid; not sleeping well at night due to cough; no further fever;    Objective:  Vitals:   04/17/18 1329  BP: 140/70  Pulse: 68  Temp: 98 F (36.7 C)  TempSrc: Oral  SpO2: 99%  Weight: 211 lb 4 oz (95.8 kg)  Height: 4\' 11"  (1.499 m)    General: Well developed, well nourished, in no acute distress  Skin : Warm and dry.  Head: Normocephalic and atraumatic  Eyes: Sclera and conjunctiva clear; pupils round and reactive to light; extraocular movements intact  Ears: External normal; canals clear; tympanic membranes normal  Oropharynx: Pink, supple. No suspicious lesions  Neck: Supple without thyromegaly, adenopathy  Lungs: Respirations unlabored; wheezes noted in all 4 lobes- improvement noted after albuterol treatment given in office today;  CVS exam: normal rate and regular rhythm.  Neurologic: Alert and oriented; speech intact; face symmetrical; moves all extremities well; CNII-XII intact without focal deficit   Assessment:  1. Acute bronchitis, unspecified organism   2. Wheezing     Plan:  Reviewed CXR done on 04/13/2018- no pneumonia; albuterol nebulizer given in office with benefit; continue Levaquin as prescribed; stop Symbicort and change to Prednisone 40 mg qd x 5 days; Rx for albuterol inhaler to use every 4-6 hours as needed; strict ER precautions for upcoming weekend; will call patient on Monday to check on her;   Also discussed having patient attend vestibular therapy for chronic dizziness once she is feeling better- she will let our office know when she is ready to schedule;   No follow-ups on file.  No orders of the defined types were placed in this encounter.   Requested Prescriptions   Signed Prescriptions Disp Refills  . predniSONE (DELTASONE) 20 MG tablet 10 tablet 0    Sig: Take 2 tablets (40 mg total) by mouth daily with breakfast for 5 days.  Marland Kitchen albuterol (PROVENTIL HFA;VENTOLIN HFA) 108 (90 Base) MCG/ACT inhaler 1 Inhaler 0    Sig:  Inhale 2 puffs into the lungs every 6 (six) hours as needed for wheezing or shortness of breath.

## 2018-04-20 NOTE — Addendum Note (Signed)
Addended by: Sherlene Shams on: 04/20/2018 10:51 AM   Modules accepted: Orders

## 2018-04-20 NOTE — Telephone Encounter (Signed)
Glad to hear she is feeling better; I am fine with cancelling the appt for 5/28; I want her to get repeat CXR in mid-end June as we discussed. We can talk about the "dizziness" PT when she is ready as well.

## 2018-04-20 NOTE — Telephone Encounter (Signed)
Spoke with patient and info given. She will call us back when she is ready to proceed with the PT dizzy therapy.

## 2018-04-20 NOTE — Telephone Encounter (Signed)
(  FYI) Called and spoke with patient. She is continuing to feel much better. She had an appointment on 5/28 for a followup but she asked that I cancel it since she was feeling better. She is still hoarse but not coughing as much and she is feeling better. She will call us back if she does not continue to see improvement or if she moves back in the wrong direction.

## 2018-04-24 DIAGNOSIS — F444 Conversion disorder with motor symptom or deficit: Secondary | ICD-10-CM | POA: Diagnosis not present

## 2018-04-28 ENCOUNTER — Ambulatory Visit: Payer: Medicare Other | Admitting: Family

## 2018-05-19 ENCOUNTER — Ambulatory Visit (INDEPENDENT_AMBULATORY_CARE_PROVIDER_SITE_OTHER)
Admission: RE | Admit: 2018-05-19 | Discharge: 2018-05-19 | Disposition: A | Discharge status: Home / Self Care | Payer: Medicare Other | Source: Ambulatory Visit | Attending: Family | Admitting: Family

## 2018-05-19 DIAGNOSIS — I517 Cardiomegaly: Secondary | ICD-10-CM | POA: Diagnosis not present

## 2018-05-19 DIAGNOSIS — J9811 Atelectasis: Secondary | ICD-10-CM | POA: Diagnosis not present

## 2018-05-20 ENCOUNTER — Other Ambulatory Visit: Payer: Self-pay | Admitting: Family

## 2018-05-20 DIAGNOSIS — I517 Cardiomegaly: Secondary | ICD-10-CM

## 2018-05-20 DIAGNOSIS — R42 Dizziness and giddiness: Secondary | ICD-10-CM

## 2018-05-20 DIAGNOSIS — J449 Chronic obstructive pulmonary disease, unspecified: Secondary | ICD-10-CM

## 2018-05-22 ENCOUNTER — Other Ambulatory Visit: Payer: Self-pay | Admitting: Family

## 2018-05-22 DIAGNOSIS — R42 Dizziness and giddiness: Secondary | ICD-10-CM

## 2018-05-28 NOTE — Progress Notes (Signed)
@Patient  ID: Veronica Garza, female    DOB: 1941/05/07, 77 y.o.   MRN: [id number]  Chief Complaint  Patient presents with  . Follow-up    Reports when she coughs she here's crackles since May. Has had diarrhea since starting pecid and prilosec.     Referring provider: Janith Lima, MD  HPI: 77 year old female followed for obstructive sleep apnea Chronic sinus disease status post surgery 2008  Recent [facility] Encounters:   Test PSG 2007 >> severe OSA with AHI of 64 events per hour, wt195 pounds >>corrected by nasal CPAP of 13 cm.   10/17/2017 Follow up : OSA  Patient presents for a follow-up for sleep apnea.  Says she is doing well on her CPAP machine.  She feels rested with no significant daytime sleepiness she wears it each night.  Patient is on CPAP 7 cm H2O.  Family shows excellent compliance with average usage around 7 hours.  AHI is 1.6.  Minimal leaks. She has has nasal mask now , does make bridge of her nose sore at times.     05/29/18 OV  77 year old patient seen here today for sleep follow-up.  CPAP compliance report showing 90 to 90 days use, all 90 days greater than 4 hours.  Average usage 6 hours 47 minutes.  Set pressure 7 cm H2O.  AHI 1.8.  Patient reporting that new mask is working great and has much better coverage with less leaks.  Has been using new mask for the last 2 months.  Compliance report confirms that leaks have improved.  Patient also reporting she is now been having a dry cough for the past couple of months.  Patient reports dry cough with no pattern to when this occurs.  Happening about 3 times a day.  Sometimes this is more vigorous.  With patient has rescue inhaler on medication list but she reports she is out of it.  Patient has seen GI in the past and was started on 40 mg Pepcid.  Patient now reporting she is intolerant of using Pepcid as it gives her daily diarrhea.  Patient has not followed up with GI regarding what she should be on.   Patient has been without Pepcid for 1 week.   Allergies  Allergen Reactions  . Iohexol Hives     Code: HIVES, Desc: ? Contrast reaction from CT prior to 2000.   . Naproxen Itching and Rash  . Neomycin-Bacitracin Zn-Polymyx Itching and Rash    REACTION: redness  . Sulfonamide Derivatives Itching and Rash    REACTION: rash/tingling    Immunization History  Administered Date(s) Administered  . Influenza Split 09/04/2012  . Influenza Whole 08/30/2009, 09/18/2010, 08/08/2011  . Influenza, High Dose Seasonal PF 08/30/2014, 09/02/2016, 09/11/2017  . Influenza,inj,Quad PF,6+ Mos 09/02/2013, 09/01/2015  . Pneumococcal Conjugate-13 01/01/2018  . Td 04/06/2010  . Zoster 09/04/2012    Past Medical History:  Diagnosis Date  . Anemia   . Arthritis   . Cataract   . GERD (gastroesophageal reflux disease)   . Hx of shoulder replacement    lt  . Hypertension   . OSA (obstructive sleep apnea)   . Pneumonia    History of  . Urinary incontinence     Tobacco History: Social History   Tobacco Use  Smoking Status Never Smoker  Smokeless Tobacco Never Used   Counseling given: Yes Continue not smoking.   Outpatient Encounter Medications as of 05/29/2018  Medication Sig  . albuterol (PROVENTIL HFA;VENTOLIN HFA) 108 (  90 Base) MCG/ACT inhaler Inhale 2 puffs into the lungs every 6 (six) hours as needed for wheezing or shortness of breath.  . benzonatate (TESSALON PERLES) 100 MG capsule Take by mouth.  . Calcium Carbonate-Vitamin D (CALTRATE 600+D) 600-400 MG-UNIT per tablet Take 1 tablet by mouth 2 (two) times daily.   . famotidine (PEPCID) 40 MG tablet Take 1 tablet (40 mg total) by mouth at bedtime.  . mirabegron ER (MYRBETRIQ) 50 MG TB24 tablet Take 50 mg by mouth daily.  . Multiple Vitamins-Minerals (ICAPS AREDS 2 PO) Take by mouth.  Marland Kitchen omeprazole (PRILOSEC) 40 MG capsule Take 1 capsule (40 mg total) by mouth daily.  Marland Kitchen PARoxetine (PAXIL) 10 MG tablet TAKE 1 TABLET DAILY  . Polyethyl  Glycol-Propyl Glycol (SYSTANE OP) Apply 1 drop to eye as directed. Into both eyes four times a day for dry eyes  . TURMERIC PO Take 1 tablet by mouth daily. Take one by mouth daily  . [DISCONTINUED] albuterol (PROVENTIL HFA;VENTOLIN HFA) 108 (90 Base) MCG/ACT inhaler Inhale 2 puffs into the lungs every 6 (six) hours as needed for wheezing or shortness of breath.  . benzonatate (TESSALON) 200 MG capsule Take 1 capsule (200 mg total) by mouth 3 (three) times daily as needed for cough.   No facility-administered encounter medications on file as of 05/29/2018.      Review of Systems  Constitutional:  +fatigue  No  weight loss, night sweats,  fevers, chills HEENT:   No headaches,  Difficulty swallowing,  Tooth/dental problems, or  Sore throat, No sneezing, itching, ear ache, nasal congestion, post nasal drip  CV:  No chest pain,  orthopnea, PND, swelling in lower extremities, anasarca, dizziness, palpitations, syncope  GI:  No heartburn, indigestion, abdominal pain, nausea, vomiting, diarrhea, change in bowel habits, loss of appetite, bloody stools Resp: +dry cough No shortness of breath with exertion or at rest.  No excess mucus, no productive cough,  No coughing up of blood.  No change in color of mucus.  No wheezing.  No chest wall deformity Skin: no rash, lesions, no skin changes. GU: no dysuria, change in color of urine, no urgency or frequency.  No flank pain, no hematuria  MS:  No joint pain or swelling.  No decreased range of motion.  No back pain. Psych:  No change in mood or affect. No depression or anxiety.  No memory loss.   Physical Exam  BP 138/88   Pulse 78   Ht 4\' 11"  (1.499 m)   Wt 214 lb 6.4 oz (97.3 kg)   SpO2 97%   BMI 43.30 kg/m   Wt Readings from Last 3 Encounters:  05/29/18 214 lb 6.4 oz (97.3 kg)  04/17/18 211 lb 4 oz (95.8 kg)  04/13/18 213 lb (96.6 kg)    GEN: A/Ox3; pleasant , NAD, well nourished, RA    HEENT:  [place]/AT,  EACs-clear, TMs-wnl, NOSE-clear,  THROAT- +post nasal drip    NECK:  Supple w/ fair ROM; no JVD;  no lymphadenopathy.    RESP:  Clear  P & A; w/o, wheezes/ rales/ or rhonchi. no accessory muscle use, no dullness to percussion  CARD:  RRR, no m/r/g, no peripheral edema, pulses intact, no cyanosis or clubbing.  GI:   Soft & nt; nml bowel sounds; no organomegaly or masses detected.   Musco: Warm bil, no deformities or joint swelling noted.   Neuro: alert, no focal deficits noted.    Skin: Warm, no lesions or rashes  Lab Results:  CBC    Component Value Date/Time   WBC 5.5 01/01/2018 1020   RBC 4.26 01/01/2018 1020   HGB 13.7 01/01/2018 1020   HCT 39.5 01/01/2018 1020   PLT 184.0 01/01/2018 1020   MCV 92.6 01/01/2018 1020   MCH 31.9 03/27/2016 1740   MCHC 34.6 01/01/2018 1020   RDW 14.6 01/01/2018 1020   LYMPHSABS 1.7 01/01/2018 1020   MONOABS 0.6 01/01/2018 1020   EOSABS 0.2 01/01/2018 1020   BASOSABS 0.0 01/01/2018 1020    BMET    Component Value Date/Time   NA 140 01/01/2018 1020   K 4.2 01/01/2018 1020   CL 101 01/01/2018 1020   CO2 31 01/01/2018 1020   GLUCOSE 112 (H) 01/01/2018 1020   BUN 21 01/01/2018 1020   CREATININE 0.74 01/01/2018 1020   CALCIUM 10.0 01/01/2018 1020   GFRNONAA >60 03/27/2016 1740   GFRAA >60 03/27/2016 1740    BNP No results found for: BNP  ProBNP No results found for: PROBNP  Imaging: Dg Chest 2 View  Result Date: 05/19/2018 CLINICAL DATA:  Wheezing. EXAM: CHEST - 2 VIEW COMPARISON:  04/13/2018. FINDINGS: Mediastinum and hilar structures normal. Cardiomegaly with normal pulmonary vascularity. Low lung volumes with mild bibasilar atelectasis. No pleural effusion or pneumothorax. Bilateral shoulder replacements. IMPRESSION: 1.  Low lung volumes with mild bibasilar atelectasis. 2.  Cardiomegaly with normal pulmonary vascularity. Electronically Signed   By: Marcello Moores  Register   On: 05/19/2018 14:40     Assessment & Plan:   Pleasant 77 year old patient seen in  office today.  Patient's obstructive sleep apnea is well managed as discussed with patient.  Patient now having upper airway cough.  Will increase Tessalon Perles, have her follow-up with GI for closer management of her GERD, spirometry and FeNo complete in office today FeNo was 12.  Spirometry showed mild restriction.  Refilled albuterol rescue inhaler.  I do not think there is a need to repeat chest x-ray today as 05/19/2018 chest x-ray was recently completed.  We will have patient follow-up in 6 weeks.  Obstructive sleep apnea Continue CPAP use Continue using daily Follow-up with our office if you have any difficulties using your machine   GERD Patient intolerant of Pepcid Follow-up with GI regarding what they would like you to be on for controller therapy Do this prior to your appointment with GI in August  Morbid obesity ([place]) Continue to work towards healthy weight   Cough FeNo today >>> 12 Spirometry today - mild restriction  Refill rescue inhaler today Contact GI regarding your Pepcid symptoms >>>let them know you are unable to take and need another controller therapy   . We believe you have a chronic/cyclical cough that is aggravated by reflux , coughing , and drainage.  . Goal is to not Cough or clear throat.  Marland Kitchen Avoid coughing or clearing throat by using:  o non-mint products/sugarless candy o Water o ice chips o Remember NO MINT PRODUCTS  . Medications to use:  o Mucinex DM 1-2 every 12 hrs or Delsym 2 tsp every 12 hrs f or cough o Tessalon Three times a day  As needed  Cough.  o Contact GI about medications needed for GERD.  o Zyrtec 10mg  at bedtime o Chlor tabs 4mg  2 at bedtime  for nasal drip until cough is 100% cough free.         Lauraine Rinne, NP 05/29/2018

## 2018-05-29 ENCOUNTER — Ambulatory Visit (INDEPENDENT_AMBULATORY_CARE_PROVIDER_SITE_OTHER): Payer: Medicare Other | Admitting: Pulmonary Disease

## 2018-05-29 ENCOUNTER — Encounter: Payer: Self-pay | Admitting: Pulmonary Disease

## 2018-05-29 VITALS — BP 138/88 | HR 78 | Ht 59.0 in | Wt 214.4 lb

## 2018-05-29 DIAGNOSIS — R05 Cough: Secondary | ICD-10-CM | POA: Diagnosis not present

## 2018-05-29 DIAGNOSIS — G4733 Obstructive sleep apnea (adult) (pediatric): Secondary | ICD-10-CM | POA: Diagnosis not present

## 2018-05-29 DIAGNOSIS — R059 Cough, unspecified: Secondary | ICD-10-CM

## 2018-05-29 DIAGNOSIS — K219 Gastro-esophageal reflux disease without esophagitis: Secondary | ICD-10-CM | POA: Diagnosis not present

## 2018-05-29 LAB — POCT EXHALED NITRIC OXIDE: FENO LEVEL (PPB): 12

## 2018-05-29 MED ORDER — ALBUTEROL SULFATE HFA 108 (90 BASE) MCG/ACT IN AERS: 2.0000 | INHALATION_SPRAY | Freq: Four times a day (QID) | RESPIRATORY_TRACT | 0 refills | Status: DC | PRN

## 2018-05-29 MED ORDER — BENZONATATE 200 MG PO CAPS: 200.0000 mg | ORAL_CAPSULE | Freq: Three times a day (TID) | ORAL | 1 refills | Status: DC | PRN

## 2018-05-29 NOTE — Assessment & Plan Note (Signed)
Continue CPAP use Continue using daily Follow-up with our office if you have any difficulties using your machine

## 2018-05-29 NOTE — Patient Instructions (Addendum)
FeNo today >>> 12 Spirometry today  Refill rescue inhaler today Contact GI regarding your Pepcid symptoms >>>let them know you are unable to take and need another controller therapy   . We believe you have a chronic/cyclical cough that is aggravated by reflux , coughing , and drainage.  . Goal is to not Cough or clear throat.  Marland Kitchen Avoid coughing or clearing throat by using:  o non-mint products/sugarless candy o Water o ice chips o Remember NO MINT PRODUCTS  . Medications to use:  o Mucinex DM 1-2 every 12 hrs or Delsym 2 tsp every 12 hrs f or cough o Tessalon Three times a day  As needed  Cough.  o Contact GI about medications needed for GERD.  o Zyrtec 10mg  at bedtime o Chlor tabs 4mg  2 at bedtime  for nasal drip until cough is 100% cough free.      Wyn Quaker FNP   Please contact the office if your symptoms worsen or you have concerns that you are not improving.   Thank you for choosing  [facility] for your healthcare, and for allowing [place] to partner with you on your healthcare journey. I am thankful to be able to provide care to you today.   Wyn Quaker FNP-C

## 2018-05-29 NOTE — Assessment & Plan Note (Signed)
FeNo today >>> 12 Spirometry today - mild restriction  Refill rescue inhaler today Contact GI regarding your Pepcid symptoms >>>let them know you are unable to take and need another controller therapy   . We believe you have a chronic/cyclical cough that is aggravated by reflux , coughing , and drainage.  . Goal is to not Cough or clear throat.  Marland Kitchen Avoid coughing or clearing throat by using:  o non-mint products/sugarless candy o Water o ice chips o Remember NO MINT PRODUCTS  . Medications to use:  o Mucinex DM 1-2 every 12 hrs or Delsym 2 tsp every 12 hrs f or cough o Tessalon Three times a day  As needed  Cough.  o Contact GI about medications needed for GERD.  o Zyrtec 10mg  at bedtime o Chlor tabs 4mg  2 at bedtime  for nasal drip until cough is 100% cough free.

## 2018-05-29 NOTE — Assessment & Plan Note (Signed)
Patient intolerant of Pepcid Follow-up with GI regarding what they would like you to be on for controller therapy Do this prior to your appointment with GI in August

## 2018-05-29 NOTE — Assessment & Plan Note (Signed)
Continue to work towards healthy weight 

## 2018-06-16 ENCOUNTER — Ambulatory Visit: Payer: Medicare Other | Admitting: Cardiovascular Disease

## 2018-06-16 ENCOUNTER — Ambulatory Visit (INDEPENDENT_AMBULATORY_CARE_PROVIDER_SITE_OTHER): Payer: Medicare Other | Admitting: Cardiovascular Disease

## 2018-06-16 ENCOUNTER — Encounter: Payer: Self-pay | Admitting: Cardiovascular Disease

## 2018-06-16 VITALS — BP 138/82 | HR 72 | Ht 59.0 in | Wt 215.0 lb

## 2018-06-16 DIAGNOSIS — I10 Essential (primary) hypertension: Secondary | ICD-10-CM | POA: Diagnosis not present

## 2018-06-16 DIAGNOSIS — I517 Cardiomegaly: Secondary | ICD-10-CM | POA: Insufficient documentation

## 2018-06-16 NOTE — Assessment & Plan Note (Signed)
Recent chest x-ray performed 04/13/2018 the setting of wheezing showed cardiomegaly with normal pulmonary vascularity.  The patient did have a 2D echo performed 11/04/2014 that was entirely normal with normal LV size and function.  I reassured her that her heart is not enlarged.

## 2018-06-16 NOTE — Progress Notes (Signed)
06/16/2018 Veronica Garza   06/25/1941  [id number]  Primary Physician Janith Lima, MD Primary Cardiologist: Lorretta Harp MD Lupe Carney, [place]  HPI:  Veronica Garza is a 77 y.o. moderately overweight married Caucasian female mother of 9, grandmother of one grandchild who is accompanied by her husband [place] today.  She was referred by Jodi Mourning NP  for evaluation of cardiomegaly.  She apparently has hypertension hyperlipidemia not on medical therapy.  She has never smoked.  There is no family history of heart disease.  She is never had a heart attack or stroke.  She denies chest pain or shortness of breath.  A chest x-ray performed 04/13/2018 showed cardiomegaly although a 2D echo performed 11/04/2014 was entirely normal.   Current Meds  Medication Sig  . albuterol (PROVENTIL HFA;VENTOLIN HFA) 108 (90 Base) MCG/ACT inhaler Inhale 2 puffs into the lungs every 6 (six) hours as needed for wheezing or shortness of breath.  . benzonatate (TESSALON) 200 MG capsule Take 1 capsule (200 mg total) by mouth 3 (three) times daily as needed for cough.  . Calcium Carbonate-Vitamin D (CALTRATE 600+D) 600-400 MG-UNIT per tablet Take 1 tablet by mouth 2 (two) times daily.   . famotidine (PEPCID) 40 MG tablet Take 1 tablet (40 mg total) by mouth at bedtime.  . mirabegron ER (MYRBETRIQ) 50 MG TB24 tablet Take 50 mg by mouth daily.  . Multiple Vitamins-Minerals (ICAPS AREDS 2 PO) Take by mouth.  Marland Kitchen omeprazole (PRILOSEC) 40 MG capsule Take 1 capsule (40 mg total) by mouth daily.  Marland Kitchen PARoxetine (PAXIL) 10 MG tablet TAKE 1 TABLET DAILY  . Polyethyl Glycol-Propyl Glycol (SYSTANE OP) Apply 1 drop to eye as directed. Into both eyes four times a day for dry eyes  . TURMERIC PO Take 1 tablet by mouth daily. Take one by mouth daily     Allergies  Allergen Reactions  . Iohexol Hives     Code: HIVES, Desc: ? Contrast reaction from CT prior to 2000.   . Naproxen Itching and Rash  .  Neomycin-Bacitracin Zn-Polymyx Itching and Rash    REACTION: redness  . Sulfonamide Derivatives Itching and Rash    REACTION: rash/tingling    Social History   Socioeconomic History  . Marital status: Married    Spouse name: Not on file  . Number of children: 1  . Years of education: Not on file  . Highest education level: Not on file  Occupational History  . Occupation: Retired  Scientific laboratory technician  . Financial resource strain: Not hard at all  . Food insecurity:    Worry: Never true    Inability: Never true  . Transportation needs:    Medical: No    Non-medical: No  Tobacco Use  . Smoking status: Never Smoker  . Smokeless tobacco: Never Used  Substance and Sexual Activity  . Alcohol use: No  . Drug use: No  . Sexual activity: Not Currently  Lifestyle  . Physical activity:    Days per week: Not on file    Minutes per session: Not on file  . Stress: Not on file  Relationships  . Social connections:    Talks on phone: Not on file    Gets together: Not on file    Attends religious service: Not on file    Active member of club or organization: Not on file    Attends meetings of clubs or organizations: Not on file    Relationship status:  Not on file  . Intimate partner violence:    Fear of current or ex partner: Not on file    Emotionally abused: Not on file    Physically abused: Not on file    Forced sexual activity: Not on file  Other Topics Concern  . 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  Cardiovascular: negative for chest pain, dyspnea on exertion, edema, orthopnea, palpitations, paroxysmal nocturnal dyspnea or shortness of breath Dermatological: negative for rash Respiratory: negative for cough or wheezing Urologic: negative for hematuria Abdominal: negative for nausea, vomiting, diarrhea, bright red blood per rectum,  melena, or hematemesis Neurologic: negative for visual changes, syncope, or dizziness All other systems reviewed and are otherwise negative except as noted above.    Blood pressure 138/82, pulse 72, height 4\' 11"  (1.499 m), weight 215 lb (97.5 kg).  General appearance: alert and no distress Neck: no adenopathy, no carotid bruit, no JVD, supple, symmetrical, trachea midline and thyroid not enlarged, symmetric, no tenderness/mass/nodules Lungs: clear to auscultation bilaterally Heart: regular rate and rhythm, S1, S2 normal, no murmur, click, rub or gallop Extremities: extremities normal, atraumatic, no cyanosis or edema Pulses: 2+ and symmetric Skin: Skin color, texture, turgor normal. No rashes or lesions Neurologic: Alert and oriented X 3, normal strength and tone. Normal symmetric reflexes. Normal coordination and gait  EKG sinus rhythm at 72 with septal Q waves, PACs and sinus arrhythmia.  I personally reviewed this EKG.  ASSESSMENT AND PLAN:   Essential hypertension, benign History of essential hypertension her blood pressure measured today 138/82.  She is not on antihypertensive medications.  Enlarged heart Recent chest x-ray performed 04/13/2018 the setting of wheezing showed cardiomegaly with normal pulmonary vascularity.  The patient did have a 2D echo performed 11/04/2014 that was entirely normal with normal LV size and function.  I reassured her that her heart is not enlarged.      Lorretta Harp MD FACP,FACC,FAHA, [facility] 06/16/2018 11:58 AM

## 2018-06-16 NOTE — Assessment & Plan Note (Signed)
History of essential hypertension her blood pressure measured today 138/82.  She is not on antihypertensive medications.

## 2018-06-16 NOTE — Patient Instructions (Signed)
Medication Instructions:  Your physician recommends that you continue on your current medications as directed. Please refer to the Current Medication list given to you today.   Labwork: none  Testing/Procedures: none  Follow-Up: Follow up with Dr. Berry as needed.   Any Other Special Instructions Will Be Listed Below (If Applicable).     If you need a refill on your cardiac medications before your next appointment, please call your pharmacy.   

## 2018-06-18 ENCOUNTER — Other Ambulatory Visit: Payer: Self-pay | Admitting: Internal Medicine

## 2018-06-19 ENCOUNTER — Telehealth: Payer: Self-pay | Admitting: Gastroenterology

## 2018-06-19 NOTE — Telephone Encounter (Addendum)
Patient tried stopping both Pepcid and Omeprazole. Her diarrhea did resolve. She had a return of her GI reflux and burning. She restarted the Pepcid and Omeprazole. Diarrhea has returned. She will try holding the Pepcid this weekend. If the diarrhea does not resolve. She will resume Pepcid and hold Omeprazole. She agrees to call back with an update of her symptoms next week.

## 2018-07-02 ENCOUNTER — Ambulatory Visit (INDEPENDENT_AMBULATORY_CARE_PROVIDER_SITE_OTHER): Payer: Medicare Other | Admitting: Gastroenterology

## 2018-07-02 ENCOUNTER — Encounter: Payer: Self-pay | Admitting: Gastroenterology

## 2018-07-02 VITALS — BP 142/72 | HR 67 | Ht 59.0 in | Wt 216.0 lb

## 2018-07-02 DIAGNOSIS — R159 Full incontinence of feces: Secondary | ICD-10-CM | POA: Diagnosis not present

## 2018-07-02 DIAGNOSIS — E739 Lactose intolerance, unspecified: Secondary | ICD-10-CM | POA: Diagnosis not present

## 2018-07-02 DIAGNOSIS — R1310 Dysphagia, unspecified: Secondary | ICD-10-CM | POA: Diagnosis not present

## 2018-07-02 DIAGNOSIS — K580 Irritable bowel syndrome with diarrhea: Secondary | ICD-10-CM

## 2018-07-02 DIAGNOSIS — K219 Gastro-esophageal reflux disease without esophagitis: Secondary | ICD-10-CM

## 2018-07-02 DIAGNOSIS — K449 Diaphragmatic hernia without obstruction or gangrene: Secondary | ICD-10-CM | POA: Diagnosis not present

## 2018-07-02 NOTE — Patient Instructions (Addendum)
Take Benefiber 1 teaspoon three times a day   Continue Omeprazole 40 mg, 30 minutes before breakfast  Use Gaviscon tablets after meals as needed three times a day     Lactose-Free Diet, Adult If you have lactose intolerance, you are not able to digest lactose. Lactose is a natural sugar found mainly in milk and milk products. You may need to avoid all foods and beverages that contain lactose. A lactose-free diet can help you do this. What do I need to know about this diet?  Do not consume foods, beverages, vitamins, minerals, or medicines with lactose. Read ingredients lists carefully.  Look for the words "lactose-free" on labels.  Use lactase enzyme drops or tablets as directed by your health care provider.  Use lactose-free milk or a milk alternative, such as soy milk, for drinking and cooking.  Make sure you get enough calcium and vitamin D in your diet. A lactose-free eating plan can be lacking in these important nutrients.  Take calcium and vitamin D supplements as directed by your health care provider. Talk to your provider about supplements if you are not able to get enough calcium and vitamin D from food. Which foods have lactose? Lactose is found in:  Milk and foods made from milk.  Yogurt.  Cheese.  Butter.  Margarine.  Sour cream.  Cream.  Whipped toppings and nondairy creamers.  Ice cream and other milk-based desserts.  Lactose is also found in foods or products made with milk or milk ingredients. To find out whether a food contains milk or a milk ingredient, look at the ingredients list. Avoid foods with the statement "May contain milk" and foods that contain:  Butter.  Cream.  Milk.  Milk solids.  Milk powder.  Whey.  Curd.  Caseinate.  Lactose.  Lactalbumin.  Lactoglobulin.  What are some alternatives to milk and foods made with milk products?  Lactose-free milk.  Soy milk with added calcium and vitamin D.  Almond, coconut, or  rice milk with added calcium and vitamin D. Note that these are low in protein.  Soy products, such as soy yogurt, soy cheese, soy ice cream, and soy-based sour cream. Which foods can I eat? Grains Breads and rolls made without milk, such as [place], [place], or [place] bread, bagels, pita, and [employer]. Corn tortillas, corn meal, grits, and polenta. Crackers without lactose or milk solids, such as soda crackers and graham crackers. Cooked or dry cereals without lactose or milk solids. Pasta, quinoa, couscous, barley, oats, bulgur, farro, rice, wild rice, or other grains prepared without milk or lactose. Plain popcorn. Vegetables Fresh, frozen, and canned vegetables without cheese, cream, or butter sauces. Fruits All fresh, canned, frozen, or dried fruits that are not processed with lactose. Meats and Other Protein Sources Plain beef, chicken, fish, [place], lamb, veal, pork, wild game, or ham. Kosher-prepared meat products. Strained or junior meats that do not contain milk. Eggs. Soy meat substitutes. Beans, lentils, and hummus. Tofu. Nuts and seeds. Peanut or other nut butters without lactose. Soups, casseroles, and mixed dishes without cheese, cream, or milk. Dairy Lactose-free milk. Soy, rice, or almond milk with added calcium and vitamin D. Soy cheese and yogurt. Beverages Carbonated drinks. Tea. Coffee, freeze-dried coffee, and some instant coffees. Fruit and vegetable juices. Condiments Soy sauce. Carob powder. Olives. Gravy made with water. Baker's cocoa. Angie Fava. Pure seasonings and spices. Ketchup. Mustard. Bouillon. Broth. Sweets and Desserts Water and fruit ices. Gelatin. Cookies, pies, or cakes made from allowed ingredients, such as  angel food cake. Pudding made with water or a milk substitute. Lactose-free tofu desserts. Soy, coconut milk, or rice-milk-based frozen desserts. Sugar. Honey. Jam, jelly, and marmalade. Molasses. Pure sugar candy. Dark chocolate without milk.  Marshmallows. Fats and Oils Margarines and salad dressings that do not contain milk. Berniece Salines. Vegetable oils. Shortening. Mayonnaise. Soy or coconut-based cream. The items listed above may not be a complete list of recommended foods or beverages. Contact your dietitian for more options. Which foods are not recommended? Grains Breads and rolls that contain milk. Toaster pastries. Muffins, biscuits, waffles, cornbread, and pancakes. These can be prepared at home, commercial, or from mixes. Sweet rolls, donuts, English muffins, fry bread, lefse, flour tortillas with lactose, or [place] toast made with milk or milk ingredients. Crackers that contain lactose. Corn curls. Cooked or dry cereals with lactose. Vegetables Creamed or breaded vegetables. Vegetables in a cheese or butter sauce or with lactose-containing margarines. Instant potatoes. [place] fries. Scalloped or au gratin potatoes. Fruits None. Meats and Other Protein Sources Scrambled eggs, omelets, and souffles that contain milk. Creamed or breaded meat, fish, chicken, or [place]. Sausage products, such as wieners and liver sausage. Cold cuts that contain milk solids. Cheese, cottage cheese, ricotta cheese, and cheese spreads. Lasagna and macaroni and cheese. Pizza. Peanut or other nut butters with added milk solids. Casseroles or mixed dishes containing milk or cheese. Dairy All dairy products, including milk, goat's milk, buttermilk, kefir, acidophilus milk, flavored milk, evaporated milk, condensed milk, dulce de [place], eggnog, yogurt, cheese, and cheese spreads. Beverages Hot chocolate. Cocoa with lactose. Instant iced teas. Powdered fruit drinks. Smoothies made with milk or yogurt. Condiments Chewing gum that has lactose. Cocoa that has lactose. Spice blends if they contain milk products. Artificial sweeteners that contain lactose. Nondairy creamers. Sweets and Desserts Ice cream, ice milk, gelato, sherbet, and frozen yogurt. Custard,  pudding, and mousse. Cake, cream pies, cookies, and other desserts containing milk, cream, cream cheese, or milk chocolate. Pie crust made with milk-containing margarine or butter. Reduced-calorie desserts made with a sugar substitute that contains lactose. Toffee and butterscotch. Milk, white, or dark chocolate that contains milk. Fudge. Caramel. Fats and Oils Margarines and salad dressings that contain milk or cheese. Cream. Half and half. Cream cheese. Sour cream. Chip dips made with sour cream or yogurt. The items listed above may not be a complete list of foods and beverages to avoid. Contact your dietitian for more information. Am I getting enough calcium? Calcium is found in many foods that contain lactose and is important for bone health. The amount of calcium you need depends on your age:  Adults younger than 50 years: 1000 mg of calcium a day.  Adults older than 50 years: 1200 mg of calcium a day.  If you are not getting enough calcium, other calcium sources include:  Orange juice with calcium added. There are 300-350 mg of calcium in 1 cup of orange juice.  Sardines with edible bones. There are 325 mg of calcium in 3 oz of sardines.  Calcium-fortified soy milk. There are 300-400 mg of calcium in 1 cup of calcium-fortified soy milk.  Calcium-fortified rice or almond milk. There are 300 mg of calcium in 1 cup of calcium-fortified rice or almond milk.  Canned salmon with edible bones. There are 180 mg of calcium in 3 oz of canned salmon with edible bones.  Calcium-fortified breakfast cereals. There are [phone] mg of calcium in calcium-fortified breakfast cereals.  Tofu set with calcium sulfate. There are 250 mg  of calcium in  cup of tofu set with calcium sulfate.  Spinach, cooked. There are 145 mg of calcium in  cup of cooked spinach.  Edamame, cooked. There are 130 mg of calcium in  cup of cooked edamame.  Collard greens, cooked. There are 125 mg of calcium in  cup of  cooked collard greens.  Kale, frozen or cooked. There are 90 mg of calcium in  cup of cooked or frozen kale.  Almonds. There are 95 mg of calcium in  cup of almonds.  Broccoli, cooked. There are 60 mg of calcium in 1 cup of cooked broccoli.  This information is not intended to replace advice given to you by your health care provider. Make sure you discuss any questions you have with your health care provider. Document Released: 05/10/2002 Document Revised: 04/25/2016 Document Reviewed: 02/18/2014 Elsevier Interactive Patient Education  2018 [employer].   Kegel Exercises Kegel exercises help strengthen the muscles that support the rectum, vagina, small intestine, bladder, and uterus. Doing Kegel exercises can help:  Improve bladder and bowel control.  Improve sexual response.  Reduce problems and discomfort during pregnancy.  Kegel exercises involve squeezing your pelvic floor muscles, which are the same muscles you squeeze when you try to stop the flow of urine. The exercises can be done while sitting, standing, or lying down, but it is best to vary your position. Phase 1 exercises 1. Squeeze your pelvic floor muscles tight. You should feel a tight lift in your rectal area. If you are a female, you should also feel a tightness in your vaginal area. Keep your stomach, buttocks, and legs relaxed. 2. Hold the muscles tight for up to 10 seconds. 3. Relax your muscles. Repeat this exercise 50 times a day or as many times as told by your health care provider. Continue to do this exercise for at least 4-6 weeks or for as long as told by your health care provider. This information is not intended to replace advice given to you by your health care provider. Make sure you discuss any questions you have with your health care provider. Document Released: 11/04/2012 Document Revised: 07/13/2016 Document Reviewed: 10/08/2015 Elsevier Interactive Patient Education  [employer].

## 2018-07-02 NOTE — Progress Notes (Signed)
Veronica Garza    [id number]    [birth year]  Primary Care Physician:Jones, Arvid Right, MD  Referring Physician: Janith Lima, MD [address], [place] [address]  Chief complaint: Dysphagia, GERD  HPI: 77 year old female with history of chronic GERD here for follow-up visit She is currently taking omeprazole 40 mg daily with stable reflux symptoms but continues to have intermittent breakthrough heartburn and excessive belching.  No longer taking Pepcid as it caused diarrhea Barium esophagram showed nonspecific esophageal dysmotility and small hiatal hernia otherwise negative for esophageal mass, stricture or ring She continues to have intermittent dysphagia especially when she tries to swallow large pills or takes a large bite Denies any loss of appetite, food impactions or weight loss Complaining of intermittent diarrhea and fecal urgency with occasional episodes of incontinence  GI history: EGD September 13, 2011: Distal esophageal stricture status post Venia Minks dilation to 18 mm EGD February 2010 with findings suggestive of esophageal stricture status post dilation with [facility] dilator to 18 mm Flexible sigmoidoscopy September 27, 2008 with random left colon biopsies and EGD with small bowel biopsies normal Colonoscopy August 2009: Normal EGD August 2009: Distal esophageal stricture balloon dilation 15 to 18 mm    Outpatient Encounter Medications as of 07/02/2018  Medication Sig  . albuterol (PROVENTIL HFA;VENTOLIN HFA) 108 (90 Base) MCG/ACT inhaler Inhale 2 puffs into the lungs every 6 (six) hours as needed for wheezing or shortness of breath.  . benzonatate (TESSALON) 200 MG capsule Take 1 capsule (200 mg total) by mouth 3 (three) times daily as needed for cough.  . Calcium Carbonate-Vitamin D (CALTRATE 600+D) 600-400 MG-UNIT per tablet Take 1 tablet by mouth 2 (two) times daily.   Marland Kitchen ibuprofen (ADVIL,MOTRIN) 200 MG tablet Take 600 mg by mouth every  8 (eight) hours as needed.  . mirabegron ER (MYRBETRIQ) 50 MG TB24 tablet Take 50 mg by mouth daily as needed.   . Multiple Vitamins-Minerals (ICAPS AREDS 2 PO) Take by mouth.  Marland Kitchen omeprazole (PRILOSEC) 40 MG capsule Take 1 capsule (40 mg total) by mouth daily.  Marland Kitchen PARoxetine (PAXIL) 10 MG tablet TAKE 1 TABLET DAILY  . Polyethyl Glycol-Propyl Glycol (SYSTANE OP) Apply 1 drop to eye as directed. Into both eyes four times a day for dry eyes  . TURMERIC PO Take 1 tablet by mouth daily. Take one by mouth daily  . [DISCONTINUED] famotidine (PEPCID) 40 MG tablet Take 1 tablet (40 mg total) by mouth at bedtime.  . [DISCONTINUED] PARoxetine (PAXIL) 10 MG tablet TAKE 1 TABLET DAILY   No facility-administered encounter medications on file as of 07/02/2018.     Allergies as of 07/02/2018 - Review Complete 07/02/2018  Allergen Reaction Noted  . Iohexol Hives 09/12/2008  . Naproxen Itching and Rash 09/26/2008  . Neomycin-bacitracin zn-polymyx Itching and Rash 09/26/2008  . Sulfonamide derivatives Itching and Rash 09/26/2008    Past Medical History:  Diagnosis Date  . Anemia   . Arthritis   . Cataract   . GERD (gastroesophageal reflux disease)   . Hx of shoulder replacement    lt  . Hypertension   . OSA (obstructive sleep apnea)   . Pneumonia    History of  . Urinary incontinence     Past Surgical History:  Procedure Laterality Date  . ABDOMINAL HYSTERECTOMY    . EYE SURGERY     bilateral cataract extraction  . NASAL SINUS SURGERY    .  SHOULDER ARTHROSCOPY     Left  . TOTAL SHOULDER ARTHROPLASTY  10/10/2011   Procedure: TOTAL SHOULDER ARTHROPLASTY;  Surgeon: Metta Clines Supple;  Location: [facility];  Service: Orthopedics;  Laterality: Right;  . UPPER GASTROINTESTINAL ENDOSCOPY    . Vaginal Sling      Family History  Problem Relation Age of Onset  . Cancer Father        Prostate cancer  . Cancer Sister        Ovarian cancer  . Diabetes Sister   . Alzheimer's disease Sister   .  Alzheimer's disease Mother   . COPD Neg Hx   . Alcohol abuse Neg Hx   . Early death Neg Hx   . Heart disease Neg Hx   . Hyperlipidemia Neg Hx   . Hypertension Neg Hx   . Kidney disease Neg Hx   . Stroke Neg Hx     Social History   Socioeconomic History  . Marital status: Married    Spouse name: Not on file  . Number of children: 1  . Years of education: Not on file  . Highest education level: Not on file  Occupational History  . Occupation: Retired  Scientific laboratory technician  . Financial resource strain: Not hard at all  . Food insecurity:    Worry: Never true    Inability: Never true  . Transportation needs:    Medical: No    Non-medical: No  Tobacco Use  . Smoking status: Never Smoker  . Smokeless tobacco: Never Used  Substance and Sexual Activity  . Alcohol use: No  . Drug use: No  . Sexual activity: Not Currently  Lifestyle  . Physical activity:    Days per week: Not on file    Minutes per session: Not on file  . Stress: Not on file  Relationships  . Social connections:    Talks on phone: Not on file    Gets together: Not on file    Attends religious service: Not on file    Active member of club or organization: Not on file    Attends meetings of clubs or organizations: Not on file    Relationship status: Not on file  . Intimate partner violence:    Fear of current or ex partner: Not on file    Emotionally abused: Not on file    Physically abused: Not on file    Forced sexual activity: Not on file  Other Topics Concern  . 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  Positive for lack of energy HENT: Positive for ringing in ears Eyes: Negative for blurred vision.  Respiratory: Negative for cough, shortness of breath and wheezing.   Cardiovascular: Negative for chest pain and palpitations.  Gastrointestinal: as per  HPI Genitourinary: Negative for dysuria, urgency, frequency and hematuria.  Musculoskeletal: Positive for myalgias, back pain and joint pain.  Skin: Negative for itching and rash.  Neurological: Negative for dizziness, tremors, focal weakness, seizures and loss of consciousness.  Positive for problem with walking and balance Endo/Heme/Allergies: Positive for seasonal allergies.  Psychiatric/Behavioral: Negative for depression, suicidal ideas and hallucinations.  All other systems reviewed and are negative.   Physical Exam: Vitals:   07/02/18 1025  BP: (!) 142/72  Pulse: 67   Body mass index is 43.63 kg/m. Gen:  No acute distress HEENT:  EOMI, sclera anicteric Neck:     No masses; no thyromegaly Lungs:    Clear to auscultation bilaterally; normal respiratory effort CV:         Regular rate and rhythm; no murmurs Abd:      + bowel sounds; soft, non-tender; no palpable masses, no distension Ext:    Varicose veins and+ edema; adequate peripheral perfusion Skin:      Warm and dry; no rash Neuro: alert and oriented x 3 Psych: normal mood and affect  Data Reviewed:  Reviewed labs, radiology imaging, old records and pertinent past GI work up   Assessment and Plan/Recommendations:  77 year old female with history of morbid obesity, chronic GERD, hiatal hernia here with complaints of intermittent breakthrough heartburn and dysphagia GERD: Continue omeprazole 40 mg daily, 30 minutes before breakfast Discussed antireflux measures and lifestyle modification in detail Gaviscon as needed for postprandial symptoms and excessive belching  Intermittent diarrhea with fecal urgency and incontinence Possible lactose intolerance, discussed lactose-free diet Benefiber 1 teaspoon 3 times daily with meals Kegel exercises If continues to have persistent episodes of fecal urgency and incontinence will consider referral to pelvic floor PT to improve anal sphincter tone.  She likely has pelvic  floor dysfunction as also has history of urinary stress incontinence  25 minutes was spent face-to-face with the patient. Greater than 50% of the time used for counseling as well as treatment plan and follow-up. She had multiple questions which were answered to her satisfaction  K. Denzil Magnuson , MD [phone]    CC: Janith Lima, MD

## 2018-07-06 DIAGNOSIS — M5416 Radiculopathy, lumbar region: Secondary | ICD-10-CM | POA: Diagnosis not present

## 2018-07-06 DIAGNOSIS — M545 Low back pain: Secondary | ICD-10-CM | POA: Diagnosis not present

## 2018-07-08 ENCOUNTER — Encounter: Payer: Self-pay | Admitting: Gastroenterology

## 2018-07-23 ENCOUNTER — Ambulatory Visit: Payer: Medicare Other | Admitting: Pulmonary Disease

## 2018-08-18 ENCOUNTER — Ambulatory Visit: Payer: Medicare Other | Admitting: Pulmonary Disease

## 2018-09-22 ENCOUNTER — Other Ambulatory Visit: Payer: Self-pay

## 2018-09-22 ENCOUNTER — Encounter: Payer: Self-pay | Admitting: Physical Therapy

## 2018-09-22 ENCOUNTER — Ambulatory Visit: Payer: Medicare Other | Attending: Family | Admitting: Physical Therapy

## 2018-09-22 ENCOUNTER — Ambulatory Visit (INDEPENDENT_AMBULATORY_CARE_PROVIDER_SITE_OTHER): Payer: Medicare Other | Admitting: *Deleted

## 2018-09-22 DIAGNOSIS — R2681 Unsteadiness on feet: Secondary | ICD-10-CM | POA: Diagnosis not present

## 2018-09-22 DIAGNOSIS — R42 Dizziness and giddiness: Secondary | ICD-10-CM

## 2018-09-22 DIAGNOSIS — R262 Difficulty in walking, not elsewhere classified: Secondary | ICD-10-CM | POA: Diagnosis not present

## 2018-09-22 DIAGNOSIS — Z23 Encounter for immunization: Secondary | ICD-10-CM

## 2018-09-22 DIAGNOSIS — H8113 Benign paroxysmal vertigo, bilateral: Secondary | ICD-10-CM | POA: Diagnosis not present

## 2018-09-25 NOTE — Therapy (Addendum)
[facility] [address] [place], [place], [address] Phone: [phone]   Fax:  [phone]  Physical Therapy Evaluation  Patient Details  Name: Veronica Garza MRN: [id number] Date of Birth: 03-15-1941 Referring Provider (PT): Marrian Salvage, FNP   Encounter Date: 09/22/2018     09/22/18 1700  PT Visits / Re-Eval  Visit Number 1  Number of Visits 7  Date for PT Re-Evaluation 11/04/18  Authorization  Authorization Type Medicare A and B; [employer] (no further insurance info entered re: copay or VL)  Authorization Time Period 09/22/18 to 12/22/2018  PT Time Calculation  PT Start Time 1449  PT Stop Time 1540  PT Time Calculation (min) 51 min  PT - End of Session  Activity Tolerance Patient tolerated treatment well  Behavior During Therapy [facility] for tasks assessed/performed       09/22/18 1502  Symptoms/Limitations  Subjective call me "D"; Reports she has had increasing dizziness and losses of balance. Her PCP has looked at and reduced her medicines with no improvement. She has seen Dr. Redmond Baseman (ENT) and told not an inner ear problem. (See vestibular assessment for further details re: symptoms)  Patient is accompained by: Family member (husband, [place])  Pertinent History low sodium has caused her to pass out  Diagnostic tests reports no specific tests done by Dr. Redmond Baseman  Patient Stated Goals to decrease her dizziness; to improve her balance and not fall  Pain Assessment  Currently in Pain? No/denies    Past Medical History:  Diagnosis Date  . Anemia   . Arthritis   . Cataract   . GERD (gastroesophageal reflux disease)   . Hx of shoulder replacement    lt  . Hypertension   . OSA (obstructive sleep apnea)   . Pneumonia    History of  . Urinary incontinence     Past Surgical History:  Procedure Laterality Date  . ABDOMINAL HYSTERECTOMY    . EYE SURGERY     bilateral cataract extraction   . NASAL SINUS SURGERY    . SHOULDER ARTHROSCOPY     Left  . TOTAL SHOULDER ARTHROPLASTY  10/10/2011   Procedure: TOTAL SHOULDER ARTHROPLASTY;  Surgeon: Metta Clines Supple;  Location: [facility];  Service: Orthopedics;  Laterality: Right;  . UPPER GASTROINTESTINAL ENDOSCOPY    . Vaginal Sling      There were no vitals filed for this visit.                  Objective measurements completed on examination: See above findings.       Vestibular Treatment/Exercise - 09/25/18 0001      Vestibular Treatment/Exercise   Vestibular Treatment Provided  Canalith Repositioning    Canalith Repositioning  Epley Manuever Left       EPLEY MANUEVER LEFT   Number of Reps   1    Overall Response   No change     RESPONSE DETAILS LEFT  Unable to get enough neck extension in left cervical rotation to elicit symptoms; proceeded through maneuver with no symptoms elicited until side to sit at the end            PT Education - 09/25/18 0836    Education Details  results of evaluation (what is BPPV, also has hypofunction (didn't use this term) and how PT can be helpful    Person(s) Educated  Patient;Spouse    Methods  Explanation    Comprehension  Verbalized understanding  PT Short Term Goals - 09/25/18 1026      PT SHORT TERM GOAL #1   Title  Husband provides supervision with HEP for safety and accuracy of technique (Target for all STGs 10/14/2018)    Time  3    Period  Weeks    Status  New    Target Date  10/14/18      PT SHORT TERM GOAL #2   Title  After addressing vestibular deficits, will complete TUG assessment to further define degree of fall risk/imbalance.     Time  3    Period  Weeks    Status  New      PT SHORT TERM GOAL #3   Title  Patient will report no longer having spinning sensation when she moves from supine to sitting at EOB each morning.    Time  3    Period  Weeks    Status  New      PT SHORT TERM GOAL #4   Title  Will assess Berg Balance test at 3  week assessment if appropriate (will add additional information to balance assessment and plan) and set LTG    Time  3    Period  Weeks    Status  New        PT Long Term Goals - 09/25/18 1030      PT LONG TERM GOAL #1   Title  Patient will be independent with HEP to address balance and safety with walking (LTGs target 11/04/2018)    Time  6    Period  Weeks    Status  New    Target Date  11/04/18      PT LONG TERM GOAL #2   Title  Patient will decrease TUG normal by 3 seconds demonstrating progress with improved balance and gait.    Time  6    Period  Weeks    Status  New      PT LONG TERM GOAL #3   Title  Patient will ascend/descend curb with LRAD with supervision to allow incr independence in community ambulation    Time  6    Period  Weeks    Status  New      PT LONG TERM GOAL #4   Title  Patient will ambulate 150 ft on level surfaces with LRAD and supervision with no loss of balance that requires physical assist for her to recover.     Time  6    Period  Weeks    Status  New          09/22/18 1700  Plan  Clinical Impression Statement Patient referred to OPPT due to dizziness for vestibular evaluation. Noted pt +spinning sensation with Lt sidelying test (highly suggestive of left posterior canal BPPV but no nystagmus observed). Patient also with +HIT indicative of unilateral peripheral hypofunction. She is very motivated to improve her dizziness and balance to reduce her risk of falling. (She denies falls, however reports many "close calls"). She has limited cervical ROM and therefore doubtful today's Epley maneuver cleared her BPPV. On next visit may attempt Gans maneuver or other option for left posterior BPPV. Patient can benefit from PT to address the deficits listed below via the interventions listed below.   History and Personal Factors relevant to plan of care: PMH-arthritis, lt shoulder replacement, HTN, OSA, urinary incontinence; no personal factors (very  supportive husband)  Clinical Presentation Unstable  Clinical Presentation due to: +BPPV that has not  yet responded to treatment; multiple near-falls due to dizziness  Clinical Decision Making Moderate  Pt will benefit from skilled therapeutic intervention in order to improve on the following deficits Abnormal gait;Decreased activity tolerance;Decreased balance;Decreased mobility;Decreased knowledge of use of DME;Decreased knowledge of precautions;Decreased range of motion;Dizziness;Increased fascial restricitons;Impaired flexibility;Postural dysfunction;Obesity  Rehab Potential Good  Clinical Impairments Affecting Rehab Potential pt's obesity, limited cervical ROM , and pain in left sidelying due to left shoulder replacement make cannalith repositioning maneuvers difficult due to cannot achieve needed positioning  PT Frequency 1x / week  PT Duration 6 weeks  PT Treatment/Interventions ADLs/Self Care Home Management;Canalith Repostioning;DME Instruction;Gait training;Neuromuscular re-education;Balance training;Therapeutic exercise;Therapeutic activities;Functional mobility training;Patient/family education;Manual techniques;Dry needling;Passive range of motion;Vestibular;Visual/perceptual remediation/compensation  PT Next Visit Plan reassess for left post BPPV; ?use Gans maneuver; initiate VORx1? further assess balance and give balance ex's for HEP  Consulted and Agree with Plan of Care Patient;Family member/caregiver  Family Member Consulted husband          Patient will benefit from skilled therapeutic intervention in order to improve the following deficits and impairments:  Abnormal gait, Decreased activity tolerance, Decreased balance, Decreased mobility, Decreased knowledge of use of DME, Decreased knowledge of precautions, Decreased range of motion, Dizziness, Increased fascial restricitons, Impaired flexibility, Postural dysfunction, Obesity  Visit Diagnosis: Dizziness and giddiness -  Plan: PT plan of care cert/re-cert  BPPV (benign paroxysmal positional vertigo), bilateral - Plan: PT plan of care cert/re-cert  Unsteadiness on feet - Plan: PT plan of care cert/re-cert  Difficulty in walking, not elsewhere classified - Plan: PT plan of care cert/re-cert     Problem List Patient Active Problem List   Diagnosis Date Noted  . Enlarged heart 06/16/2018  . Ataxia 01/01/2018  . Morbid obesity ([place]) 10/17/2017  . Cough 12/31/2016  . 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 Hot flashes, menopausal 12/31/2016  . DDD (degenerative disc disease), lumbar 11/06/2015  . Right-sided low back pain without sciatica 06/21/2015  . Diastolic dysfunction without heart failure 11/13/2014  . Other abnormal glucose 05/27/2013  . Osteopenia of the elderly 05/27/2013  . Hyperlipidemia LDL goal <130 09/04/2012  . Esophageal stricture 09/12/2011  . Visit for screening mammogram 08/08/2011  . Routine general medical examination at a health care facility 08/08/2011  . Essential hypertension, benign 01/14/2011  . URINARY INCONTINENCE 12/14/2008  . OA (osteoarthritis) 07/21/2008  . Obstructive sleep apnea 10/14/2007  . GERD 10/14/2007    Rexanne Mano, PT 09/25/2018, 10:39 AM  [facility] [address] [place], [place], [address] Phone: [phone]   Fax:  [phone]  Name: JAMIL CASTILLO MRN: [id number] Date of Birth: 01/04/1941

## 2018-09-28 ENCOUNTER — Encounter: Payer: Self-pay | Admitting: Physical Therapy

## 2018-09-28 ENCOUNTER — Ambulatory Visit: Payer: Medicare Other | Admitting: Physical Therapy

## 2018-09-28 DIAGNOSIS — H8113 Benign paroxysmal vertigo, bilateral: Secondary | ICD-10-CM

## 2018-09-28 DIAGNOSIS — R42 Dizziness and giddiness: Secondary | ICD-10-CM | POA: Diagnosis not present

## 2018-09-28 DIAGNOSIS — R2681 Unsteadiness on feet: Secondary | ICD-10-CM | POA: Diagnosis not present

## 2018-09-28 DIAGNOSIS — R262 Difficulty in walking, not elsewhere classified: Secondary | ICD-10-CM | POA: Diagnosis not present

## 2018-09-28 NOTE — Patient Instructions (Signed)
Gaze Stabilization: Tip Card  1.Target must remain in focus, not blurry, and appear stationary while head is in motion. 2.Perform exercises with small head movements (30 to either side of midline). 3.Increase speed of head motion so long as target is in focus. 4.If you wear eyeglasses, be sure you can see target through lens (therapist will give specific instructions for bifocal / progressive lenses). 5.These exercises should provoke dizziness or nausea. Work through these symptoms. If too dizzy, slow head movement slightly. (Too dizzy is moderately dizzy or 5 out of 10 with a 10 meaning you're so dizzy you are falling).  Overall the symptoms of dizziness or nausea should resolve within 30 minutes of completing all repetitions. 6.Exercises demand concentration; avoid distractions. Begin with the target on a simple background unless instructed otherwise.      Special Instructions:  If symptoms are lasting longer than 30 minutes, modify your exercises by:    >decreasing the # of times you complete each activity >ensuring your symptoms return to baseline before moving onto the next exercise >dividing up exercises so you do not do them all in one session, but multiple short sessions throughout the day >doing them once a day until symptoms improve   Gaze Stabilization: Sitting    Keeping eyes on target on wall 3 feet away, and move head side to side for _30-60___ seconds. Rest and repeat side to side. Then repeat while moving head up and down for __30-60__ seconds. Rest and repeat up and down. (ie do each direction two times)  Do __2-3__ sessions per day.      

## 2018-09-29 NOTE — Therapy (Signed)
[facility] [address] [place], [place], [address] Phone: [phone]   Fax:  [phone]  Physical Therapy Treatment  Patient Details  Name: Veronica Garza MRN: [id number] Date of Birth: 10-20-1941 Referring Provider (PT): Marrian Salvage, FNP   Encounter Date: 09/28/2018  PT End of Session - 09/29/18 0657    Visit Number  2    Number of Visits  7    Date for PT Re-Evaluation  11/04/18    Authorization Type  Medicare A and B; [employer] (no further insurance info entered re: copay or VL)    Authorization Time Period  09/22/18 to 12/22/2018    PT Start Time  1405    PT Stop Time  1447    PT Time Calculation (min)  42 min    Activity Tolerance  Patient tolerated treatment well    Behavior During Therapy  [facility] for tasks assessed/performed       Past Medical History:  Diagnosis Date  . Anemia   . Arthritis   . Cataract   . GERD (gastroesophageal reflux disease)   . Hx of shoulder replacement    lt  . Hypertension   . OSA (obstructive sleep apnea)   . Pneumonia    History of  . Urinary incontinence     Past Surgical History:  Procedure Laterality Date  . ABDOMINAL HYSTERECTOMY    . EYE SURGERY     bilateral cataract extraction  . NASAL SINUS SURGERY    . SHOULDER ARTHROSCOPY     Left  . TOTAL SHOULDER ARTHROPLASTY  10/10/2011   Procedure: TOTAL SHOULDER ARTHROPLASTY;  Surgeon: Metta Clines Supple;  Location: [facility];  Service: Orthopedics;  Laterality: Right;  . UPPER GASTROINTESTINAL ENDOSCOPY    . Vaginal Sling      There were no vitals filed for this visit.  Subjective Assessment - 09/28/18 1409    Subjective  Saturday was shopping (had just gotten out of the car) and felt offbalance and spinny. Has continued to have several episodes per day--if turns head too fast, supine to sit.     Patient is accompained by:  Family member   husband, [place]   Pertinent History  low sodium has caused  her to pass out    Diagnostic tests  reports no specific tests done by Dr. Redmond Baseman    Patient Stated Goals  to decrease her dizziness; to improve her balance and not fall    Currently in Pain?  No/denies             Vestibular Assessment - 09/28/18 1429      Vestibulo-Occular Reflex   Comment  +HIT to rt      Positional Testing   Dix-Hallpike  Dix-Hallpike Left    Sidelying Test  Sidelying Left      Dix-Hallpike Left   Dix-Hallpike Left Duration  0   up from Dix-hallpike +spinning; no nystagmus observed     Sidelying Left   Sidelying Left Duration  0    Sidelying Left Symptoms  No nystagmus                Vestibular Treatment/Exercise - 09/28/18 0001      Vestibular Treatment/Exercise   Vestibular Treatment Provided  Canalith Repositioning;Gaze    Canalith Repositioning  Comment   gans maneuver for lt posterior BPPV   Gaze Exercises  X1 Viewing Horizontal      OTHER   Comment  Gans maneuver x  1, no symptoms elicited until up from rt sidelying with head flexed and again with head moving from flexion to neutral      X1 Viewing Horizontal   Foot Position  seated    Time  --   15-20 sec   Reps  3    Comments  vc for degree of head turns, to incr speed as long as target remains clear and steady; stopping if dizziness reaches 5/10 (which it did with each rep)      Attempted to use blue wedge to do Epley, however unable to get pt in position to use (due to short stature and obesity).       PT Education - 09/29/18 0655    Education Details  added VORx1 to HEP; feel BPPV still an issue, however unable to achieve enough neck extension for proper cannalith repositioning maneuver    Person(s) Educated  Patient;Spouse    Methods  Explanation;Demonstration;Verbal cues;Handout    Comprehension  Verbalized understanding;Returned demonstration;Verbal cues required;Need further instruction       PT Short Term Goals - 09/25/18 1026      PT SHORT TERM GOAL #1    Title  Husband provides supervision with HEP for safety and accuracy of technique (Target for all STGs 10/14/2018)    Time  3    Period  Weeks    Status  New    Target Date  10/14/18      PT SHORT TERM GOAL #2   Title  After addressing vestibular deficits, will complete TUG assessment to further define degree of fall risk/imbalance.     Time  3    Period  Weeks    Status  New      PT SHORT TERM GOAL #3   Title  Patient will report no longer having spinning sensation when she moves from supine to sitting at EOB each morning.    Time  3    Period  Weeks    Status  New      PT SHORT TERM GOAL #4   Title  Will assess Berg Balance test at 3 week assessment if appropriate (will add additional information to balance assessment and plan) and set LTG    Time  3    Period  Weeks    Status  New        PT Long Term Goals - 09/25/18 1030      PT LONG TERM GOAL #1   Title  Patient will be independent with HEP to address balance and safety with walking (LTGs target 11/04/2018)    Time  6    Period  Weeks    Status  New    Target Date  11/04/18      PT LONG TERM GOAL #2   Title  Patient will decrease TUG normal by 3 seconds demonstrating progress with improved balance and gait.    Time  6    Period  Weeks    Status  New      PT LONG TERM GOAL #3   Title  Patient will ascend/descend curb with LRAD with supervision to allow incr independence in community ambulation    Time  6    Period  Weeks    Status  New      PT LONG TERM GOAL #4   Title  Patient will ambulate 150 ft on level surfaces with LRAD and supervision with no loss of balance that requires physical assist for her to recover.  Time  6    Period  Weeks    Status  New            Plan - 09/29/18 0702    Clinical Impression Statement  Session focused on BPPV assessment and cannalith repositioning, followed by education re: peripheral hypofunction and VORx1 exercise. Due to limited cervical extension ROM, body  habitus, and left shoulder replacement with pain in left sidelying unable to get optimal positioning of left posterior canal for maneuvers. Unable to give Brandt-Daroff exercises due to bil shoulder pain and body habitus with inability to move herself through the positions. Did educate in VORx1 for HEP. Patient can continue to benefit from PT.     Rehab Potential  Good    Clinical Impairments Affecting Rehab Potential  pt's obesity, limited cervical ROM , and pain in left sidelying due to left shoulder replacement make cannalith repositioning maneuvers difficult due to cannot achieve needed positioning    PT Frequency  1x / week    PT Duration  6 weeks    PT Treatment/Interventions  ADLs/Self Care Home Management;Canalith Repostioning;DME Instruction;Gait training;Neuromuscular re-education;Balance training;Therapeutic exercise;Therapeutic activities;Functional mobility training;Patient/family education;Manual techniques;Dry needling;Passive range of motion;Vestibular;Visual/perceptual remediation/compensation    PT Next Visit Plan  check VORx1 given for HEP (esp vertical as we did not get to practice); further assess balance and give balance ex's for HEP    Consulted and Agree with Plan of Care  Patient;Family member/caregiver    Family Member Consulted  husband       Patient will benefit from skilled therapeutic intervention in order to improve the following deficits and impairments:  Abnormal gait, Decreased activity tolerance, Decreased balance, Decreased mobility, Decreased knowledge of use of DME, Decreased knowledge of precautions, Decreased range of motion, Dizziness, Increased fascial restricitons, Impaired flexibility, Postural dysfunction, Obesity  Visit Diagnosis: BPPV (benign paroxysmal positional vertigo), bilateral  Dizziness and giddiness     Problem List Patient Active Problem List   Diagnosis Date Noted  . Enlarged heart 06/16/2018  . Ataxia 01/01/2018  . Morbid obesity  ([place]) 10/17/2017  . Cough 12/31/2016  . 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 Hot flashes, menopausal 12/31/2016  . DDD (degenerative disc disease), lumbar 11/06/2015  . Right-sided low back pain without sciatica 06/21/2015  . Diastolic dysfunction without heart failure 11/13/2014  . Other abnormal glucose 05/27/2013  . Osteopenia of the elderly 05/27/2013  . Hyperlipidemia LDL goal <130 09/04/2012  . Esophageal stricture 09/12/2011  . Visit for screening mammogram 08/08/2011  . Routine general medical examination at a health care facility 08/08/2011  . Essential hypertension, benign 01/14/2011  . URINARY INCONTINENCE 12/14/2008  . OA (osteoarthritis) 07/21/2008  . Obstructive sleep apnea 10/14/2007  . GERD 10/14/2007    Rexanne Mano, PT 09/29/2018, 7:52 AM  [facility] [address] [place], [place], [address] Phone: [phone]   Fax:  [phone]  Name: ELLINA SIVERTSEN MRN: [id number] Date of Birth: 1941/09/13

## 2018-10-06 ENCOUNTER — Ambulatory Visit: Payer: Medicare Other | Attending: Family | Admitting: Physical Therapy

## 2018-10-06 ENCOUNTER — Encounter: Payer: Self-pay | Admitting: Physical Therapy

## 2018-10-06 DIAGNOSIS — R262 Difficulty in walking, not elsewhere classified: Secondary | ICD-10-CM | POA: Insufficient documentation

## 2018-10-06 DIAGNOSIS — R42 Dizziness and giddiness: Secondary | ICD-10-CM

## 2018-10-06 DIAGNOSIS — R2681 Unsteadiness on feet: Secondary | ICD-10-CM | POA: Diagnosis not present

## 2018-10-06 DIAGNOSIS — H8113 Benign paroxysmal vertigo, bilateral: Secondary | ICD-10-CM | POA: Diagnosis not present

## 2018-10-06 NOTE — Patient Instructions (Signed)
Ear / Shoulder Stretch   Exhaling, move left ear toward left shoulder. Hold position for _30__ seconds. Inhaling, bring head back to center. Repeat to other side. Repeat sequence __3_ times. Do _1-2__ times per day.  AROM: Neck Rotation   Turn head slowly to look over one shoulder, then the other. Hold each position __5-10__ seconds. Repeat __10__ times per set. Do __2__ sets per session.   BILATERAL STANCE   Stance: shoulder-width on floor. Maintain balance. Hold _30__ seconds. **stand in corner with chair in front for safety** -can progress to horizontal and vertical head turns - can progress to feet together

## 2018-10-06 NOTE — Therapy (Signed)
[facility] [address] [place], [place], [address] Phone: [phone]   Fax:  [phone]  Physical Therapy Treatment  Patient Details  Name: Veronica Garza MRN: [id number] Date of Birth: March 27, 1941 Referring Provider (PT): Marrian Salvage, FNP   Encounter Date: 10/06/2018  PT End of Session - 10/06/18 0938    Visit Number  3    Number of Visits  7    Date for PT Re-Evaluation  11/04/18    Authorization Type  Medicare A and B; [employer] (no further insurance info entered re: copay or VL)    Authorization Time Period  09/22/18 to 12/22/2018    PT Start Time  0926    PT Stop Time  1010    PT Time Calculation (min)  44 min    Activity Tolerance  Patient tolerated treatment well    Behavior During Therapy  [facility] for tasks assessed/performed       Past Medical History:  Diagnosis Date  . Anemia   . Arthritis   . Cataract   . GERD (gastroesophageal reflux disease)   . Hx of shoulder replacement    lt  . Hypertension   . OSA (obstructive sleep apnea)   . Pneumonia    History of  . Urinary incontinence     Past Surgical History:  Procedure Laterality Date  . ABDOMINAL HYSTERECTOMY    . EYE SURGERY     bilateral cataract extraction  . NASAL SINUS SURGERY    . SHOULDER ARTHROSCOPY     Left  . TOTAL SHOULDER ARTHROPLASTY  10/10/2011   Procedure: TOTAL SHOULDER ARTHROPLASTY;  Surgeon: Metta Clines Supple;  Location: [facility];  Service: Orthopedics;  Laterality: Right;  . UPPER GASTROINTESTINAL ENDOSCOPY    . Vaginal Sling      There were no vitals filed for this visit.  Subjective Assessment - 10/06/18 0928    Subjective  had a dizzy spell on Sunday - lasted 30 min before started to ease off, still "didn't feel right"; yesterday had another dizzy spell.     Patient is accompained by:  Family member   Husband   Pertinent History  low sodium has caused her to pass out    Diagnostic tests  reports no  specific tests done by Dr. Redmond Baseman    Patient Stated Goals  to decrease her dizziness; to improve her balance and not fall    Currently in Pain?  No/denies                       [facility] Adult PT Treatment/Exercise - 10/06/18 0001      Exercises   Exercises  Other Exercises    Other Exercises   B UT stretch 3 x 30 sec; cervical rotation AROM 5 x 10 sec hold       Vestibular Treatment/Exercise - 10/06/18 0001      Vestibular Treatment/Exercise   Gaze Exercises  X1 Viewing Horizontal;X1 Viewing Vertical      X1 Viewing Horizontal   Foot Position  seated    Time  --   30 sec   Reps  2    Comments  vc for degree of head turns, to incr speed as long as target remains clear and steady; stopping if dizziness reaches 5/10      X1 Viewing Vertical   Foot Position  seated    Time  --   30 sec   Reps  2    Comments  vc for degree of head turns, to incr speed as long as target remains clear and steady; stopping if dizziness reaches 5/10         Balance Exercises - 10/06/18 1246      Balance Exercises: Standing   Standing Eyes Opened  Narrow base of support (BOS);Wide (BOA);2 reps;30 secs;Head turns   wide BOS + head turns, narrow BOS static head       PT Education - 10/06/18 1156    Education Details  education on VOR x 1, cervical ROM, corner balance    Person(s) Educated  Patient;Spouse    Methods  Explanation;Handout;Demonstration    Comprehension  Verbalized understanding;Returned demonstration;Verbal cues required;Need further instruction       PT Short Term Goals - 09/25/18 1026      PT SHORT TERM GOAL #1   Title  Husband provides supervision with HEP for safety and accuracy of technique (Target for all STGs 10/14/2018)    Time  3    Period  Weeks    Status  New    Target Date  10/14/18      PT SHORT TERM GOAL #2   Title  After addressing vestibular deficits, will complete TUG assessment to further define degree of fall risk/imbalance.     Time  3     Period  Weeks    Status  New      PT SHORT TERM GOAL #3   Title  Patient will report no longer having spinning sensation when she moves from supine to sitting at EOB each morning.    Time  3    Period  Weeks    Status  New      PT SHORT TERM GOAL #4   Title  Will assess Berg Balance test at 3 week assessment if appropriate (will add additional information to balance assessment and plan) and set LTG    Time  3    Period  Weeks    Status  New        PT Long Term Goals - 09/25/18 1030      PT LONG TERM GOAL #1   Title  Patient will be independent with HEP to address balance and safety with walking (LTGs target 11/04/2018)    Time  6    Period  Weeks    Status  New    Target Date  11/04/18      PT LONG TERM GOAL #2   Title  Patient will decrease TUG normal by 3 seconds demonstrating progress with improved balance and gait.    Time  6    Period  Weeks    Status  New      PT LONG TERM GOAL #3   Title  Patient will ascend/descend curb with LRAD with supervision to allow incr independence in community ambulation    Time  6    Period  Weeks    Status  New      PT LONG TERM GOAL #4   Title  Patient will ambulate 150 ft on level surfaces with LRAD and supervision with no loss of balance that requires physical assist for her to recover.     Time  6    Period  Weeks    Status  New            Plan - 10/06/18 0939    Clinical Impression Statement  Session today focusing on VOR progression with cueing  needed to not go to end range as well as increase speed - dizziness provoked with horizontal and vertical viewing with improved parameters with patient able to work through some dizziness symptoms. Addition of cervical ROM and corner balance to HEP.     Rehab Potential  Good    Clinical Impairments Affecting Rehab Potential  pt's obesity, limited cervical ROM , and pain in left sidelying due to left shoulder replacement make cannalith repositioning maneuvers difficult due to  cannot achieve needed positioning    PT Frequency  1x / week    PT Duration  6 weeks    PT Treatment/Interventions  ADLs/Self Care Home Management;Canalith Repostioning;DME Instruction;Gait training;Neuromuscular re-education;Balance training;Therapeutic exercise;Therapeutic activities;Functional mobility training;Patient/family education;Manual techniques;Dry needling;Passive range of motion;Vestibular;Visual/perceptual remediation/compensation    PT Next Visit Plan  further assess balance and give balance ex's for HEP; progress VOR?    Consulted and Agree with Plan of Care  Patient;Family member/caregiver    Family Member Consulted  husband       Patient will benefit from skilled therapeutic intervention in order to improve the following deficits and impairments:  Abnormal gait, Decreased activity tolerance, Decreased balance, Decreased mobility, Decreased knowledge of use of DME, Decreased knowledge of precautions, Decreased range of motion, Dizziness, Increased fascial restricitons, Impaired flexibility, Postural dysfunction, Obesity  Visit Diagnosis: BPPV (benign paroxysmal positional vertigo), bilateral  Dizziness and giddiness  Unsteadiness on feet  Difficulty in walking, not elsewhere classified     Problem List Patient Active Problem List   Diagnosis Date Noted  . Enlarged heart 06/16/2018  . Ataxia 01/01/2018  . Morbid obesity ([place]) 10/17/2017  . Cough 12/31/2016  . 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 Hot flashes, menopausal 12/31/2016  . DDD (degenerative disc disease), lumbar 11/06/2015  . Right-sided low back pain without sciatica 06/21/2015  . Diastolic dysfunction without heart failure 11/13/2014  . Other abnormal glucose 05/27/2013  . Osteopenia of the elderly 05/27/2013  . Hyperlipidemia LDL goal <130 09/04/2012  . Esophageal stricture 09/12/2011  . Visit for screening mammogram 08/08/2011  . Routine general medical examination at a health care  facility 08/08/2011  . Essential hypertension, benign 01/14/2011  . URINARY INCONTINENCE 12/14/2008  . OA (osteoarthritis) 07/21/2008  . Obstructive sleep apnea 10/14/2007  . GERD 10/14/2007    Lanney Gins, PT, DPT Supplemental Physical Therapist 10/06/18 12:50 PM Pager: [phone] Office: [facility] [address] [place], [place], [address] Phone: [phone]   Fax:  [phone]  Name: Veronica Garza MRN: [id number] Date of Birth: May 07, 1941

## 2018-10-15 ENCOUNTER — Ambulatory Visit: Payer: Medicare Other | Admitting: Physical Therapy

## 2018-10-15 ENCOUNTER — Encounter: Payer: Self-pay | Admitting: Physical Therapy

## 2018-10-15 VITALS — BP 142/82

## 2018-10-15 DIAGNOSIS — H8113 Benign paroxysmal vertigo, bilateral: Secondary | ICD-10-CM | POA: Diagnosis not present

## 2018-10-15 DIAGNOSIS — R42 Dizziness and giddiness: Secondary | ICD-10-CM | POA: Diagnosis not present

## 2018-10-15 DIAGNOSIS — R2681 Unsteadiness on feet: Secondary | ICD-10-CM | POA: Diagnosis not present

## 2018-10-15 DIAGNOSIS — R262 Difficulty in walking, not elsewhere classified: Secondary | ICD-10-CM | POA: Diagnosis not present

## 2018-10-15 NOTE — Therapy (Signed)
[facility] [address] [place], [place], [address] Phone: [phone]   Fax:  [phone]  Physical Therapy Treatment  Patient Details  Name: Veronica Garza MRN: [id number] Date of Birth: 1941-06-20 Referring Provider (PT): Marrian Salvage, FNP   Encounter Date: 10/15/2018  PT End of Session - 10/15/18 1740    Visit Number  4    Number of Visits  7    Date for PT Re-Evaluation  11/04/18    Authorization Type  Medicare A and B; [employer] (no further insurance info entered re: copay or VL)    Authorization Time Period  09/22/18 to 12/22/2018    PT Start Time  1535    PT Stop Time  1625    PT Time Calculation (min)  50 min    Activity Tolerance  Treatment limited secondary to medical complications (Comment)   orthostasis during assessing the Berg   Behavior During Therapy  [facility] for tasks assessed/performed       Past Medical History:  Diagnosis Date  . Anemia   . Arthritis   . Cataract   . GERD (gastroesophageal reflux disease)   . Hx of shoulder replacement    lt  . Hypertension   . OSA (obstructive sleep apnea)   . Pneumonia    History of  . Urinary incontinence     Past Surgical History:  Procedure Laterality Date  . ABDOMINAL HYSTERECTOMY    . EYE SURGERY     bilateral cataract extraction  . NASAL SINUS SURGERY    . SHOULDER ARTHROSCOPY     Left  . TOTAL SHOULDER ARTHROPLASTY  10/10/2011   Procedure: TOTAL SHOULDER ARTHROPLASTY;  Surgeon: Metta Clines Supple;  Location: [place];  Service: Orthopedics;  Laterality: Right;  . UPPER GASTROINTESTINAL ENDOSCOPY    . Vaginal Sling      Vitals:   10/15/18 1600 10/15/18 1610  BP: 136/72 seated;  110/inaudible standing (!) 142/82  seated    Subjective Assessment - 10/15/18 1538    Subjective  Dizzy spells still occuring--sometimes it was a few minutes, the other day it was 30 minutes (head felt heavy and thought I was going over, spinning  feeling).     Patient is accompained by:  Family member   Husband   Pertinent History  low sodium has caused her to pass out    Diagnostic tests  reports no specific tests done by Dr. Redmond Baseman    Patient Stated Goals  to decrease her dizziness; to improve her balance and not fall    Currently in Pain?  No/denies         [facility] PT Assessment - 10/15/18 0001      Precautions   Precautions  Fall    Precaution Comments  vasovagal syncopal episodes                   [facility] Adult PT Treatment/Exercise - 10/15/18 0001      Standardized Balance Assessment   Standardized Balance Assessment  Berg Balance Test      Berg Balance Test   Sit to Stand  Able to stand without using hands and stabilize independently    Standing Unsupported  Able to stand 2 minutes with supervision    Sitting with Back Unsupported but Feet Supported on Floor or Stool  Able to sit safely and securely 2 minutes    Stand to Sit  Sits safely with minimal use of hands  Transfers  Able to transfer safely, minor use of hands    Standing Ubsupported with Feet Together  Able to place feet together independently and stand for 1 minute with supervision    From Standing, Reach Forward with Outstretched Arm  Can reach forward >12 cm safely (5")    From Standing Position, Pick up Object from [place] to pick up shoe safely and easily      NOTE-unable to complete Berg due to orthostasis; pt became slightly dizzy with 2 min static stand. Seated rest and felt better. Proceeded with exam and after several tasks, came back to static stand with feet togethre and pt again became dizzy. Assisted to sitting and discussed present symptoms as compared to the dizzy episodes she is having at times and she reports they feel the same. After several minutes seated, checked orthostatic BPs which were positive for orthostasis. See pt education for further details       PT Education - 10/15/18 1735    Education Details  discussed  symptoms are related to drop in BP; reviewed recommendations from Dr. Marlou Porch 12/2014 re: vasovagal syncope--compression hose (she cannot do these due to severe arthritis in bil hands), incr salt intake, if symptoms begin sit with head down between her knees vs lie down. Stop doing VORx1 (not causing symptoms in sitting, do not want her doing static standing to do exercise--she became symptomatic with 2 minutes stanidng) stop AROM neck exercises as causing headaches; continue corner balance exercise in 30 sec bouts    Person(s) Educated  Patient;Spouse    Methods  Explanation    Comprehension  Verbalized understanding       PT Short Term Goals - 10/15/18 1749      PT SHORT TERM GOAL #1   Title  Husband provides supervision with HEP for safety and accuracy of technique (Target for all STGs 10/14/2018)    Time  3    Period  Weeks    Status  Achieved      PT SHORT TERM GOAL #2   Title  After addressing vestibular deficits, will complete TUG assessment to further define degree of fall risk/imbalance.     Baseline  10/15/18 unable due to orthostatic hypotension during session    Time  3    Period  Weeks    Status  Unable to assess      PT SHORT TERM GOAL #3   Title  Patient will report no longer having spinning sensation when she moves from supine to sitting at EOB each morning.    Baseline  10/15/18 Pt reports she still has spinning each time, but the intensity and duration are less    Time  3    Period  Weeks    Status  Not Met      PT SHORT TERM GOAL #4   Title  Will assess Berg Balance test at 3 week assessment if appropriate (will add additional information to balance assessment and plan) and set LTG    Baseline  10/15/18 Unable to finish Merrilee Jansky due to her orthostasis    Time  3    Period  Weeks    Status  Unable to assess        PT Long Term Goals - 09/25/18 1030      PT LONG TERM GOAL #1   Title  Patient will be independent with HEP to address balance and safety with walking  (LTGs target 11/04/2018)    Time  6  Period  Weeks    Status  New    Target Date  11/04/18      PT LONG TERM GOAL #2   Title  Patient will decrease TUG normal by 3 seconds demonstrating progress with improved balance and gait.    Time  6    Period  Weeks    Status  New      PT LONG TERM GOAL #3   Title  Patient will ascend/descend curb with LRAD with supervision to allow incr independence in community ambulation    Time  6    Period  Weeks    Status  New      PT LONG TERM GOAL #4   Title  Patient will ambulate 150 ft on level surfaces with LRAD and supervision with no loss of balance that requires physical assist for her to recover.     Time  6    Period  Weeks    Status  New            Plan - 10/15/18 1749    Clinical Impression Statement  Session was to focus on assessing STGs, however during the Berg Assessment she began to feel very dizzy and stopped to assess for orthostasis. Patient with +orthostasis from sit to stand with SBP dropping 26 mmHg. As pt seated with feet barely touching the floor (due to short stature) she began to feel worse and placed stool under her feet, offered her something to eat (she had not eaten since 8:30 am), and closely monitored her BP (which had returned to 142/82). While pt recuperating, discussed the symptoms of orthostasis and she reports this is the same feeling she has been having when reporting dizzy spells. As educated on orthostasis, she reports this sounds similar to the vasovagal syncope she was diagnosed with by Dr. Marlou Porch' in 12/2014. Reviewed Dr.Skain's note in MR and reviewed with pt his recommendations (see pt education). Explained that PT will not "fix" her vasovagal syncope, but do think she can benefit from balance exercises to improve her overall balance. Patient agrees to return to PT for further education in balance exercises.     Rehab Potential  Good    Clinical Impairments Affecting Rehab Potential  pt's obesity, limited  cervical ROM , and pain in left sidelying due to left shoulder replacement make cannalith repositioning maneuvers difficult due to cannot achieve needed positioning    PT Frequency  1x / week    PT Duration  6 weeks    PT Treatment/Interventions  ADLs/Self Care Home Management;Canalith Repostioning;DME Instruction;Gait training;Neuromuscular re-education;Balance training;Therapeutic exercise;Therapeutic activities;Functional mobility training;Patient/family education;Manual techniques;Dry needling;Passive range of motion;Vestibular;Visual/perceptual remediation/compensation    PT Next Visit Plan  give balance ex's for HEP-?[place]--avoid prolonged static standing due to orthostasis; ?? try VOR in sitting with patterned background and/or further distance--add to HEP if symptomatic    Consulted and Agree with Plan of Care  Patient;Family member/caregiver    Family Member Consulted  husband, [place]       Patient will benefit from skilled therapeutic intervention in order to improve the following deficits and impairments:  Abnormal gait, Decreased activity tolerance, Decreased balance, Decreased mobility, Decreased knowledge of use of DME, Decreased knowledge of precautions, Decreased range of motion, Dizziness, Increased fascial restricitons, Impaired flexibility, Postural dysfunction, Obesity  Visit Diagnosis: Dizziness and giddiness  Unsteadiness on feet     Problem List Patient Active Problem List   Diagnosis Date Noted  . Enlarged heart 06/16/2018  . Ataxia 01/01/2018  .  Morbid obesity ([place]) 10/17/2017  . Cough 12/31/2016  . 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 Hot flashes, menopausal 12/31/2016  . DDD (degenerative disc disease), lumbar 11/06/2015  . Right-sided low back pain without sciatica 06/21/2015  . Diastolic dysfunction without heart failure 11/13/2014  . Other abnormal glucose 05/27/2013  . Osteopenia of the elderly 05/27/2013  . Hyperlipidemia LDL goal <130  09/04/2012  . Esophageal stricture 09/12/2011  . Visit for screening mammogram 08/08/2011  . Routine general medical examination at a health care facility 08/08/2011  . Essential hypertension, benign 01/14/2011  . URINARY INCONTINENCE 12/14/2008  . OA (osteoarthritis) 07/21/2008  . Obstructive sleep apnea 10/14/2007  . GERD 10/14/2007    Rexanne Mano , PT 10/15/2018, 7:10 PM  [facility] [address] [place], [place], [address] Phone: [phone]   Fax:  [phone]  Name: CORLIS ANGELICA MRN: [id number] Date of Birth: Sep 25, 1941

## 2018-10-21 ENCOUNTER — Ambulatory Visit: Payer: Medicare Other | Admitting: Pulmonary Disease

## 2018-10-22 ENCOUNTER — Ambulatory Visit: Payer: Medicare Other | Admitting: Rehabilitative and Restorative Service Providers"

## 2018-10-22 ENCOUNTER — Encounter: Payer: Self-pay | Admitting: Rehabilitative and Restorative Service Providers"

## 2018-10-22 DIAGNOSIS — R2681 Unsteadiness on feet: Secondary | ICD-10-CM | POA: Diagnosis not present

## 2018-10-22 DIAGNOSIS — R262 Difficulty in walking, not elsewhere classified: Secondary | ICD-10-CM

## 2018-10-22 DIAGNOSIS — R42 Dizziness and giddiness: Secondary | ICD-10-CM | POA: Diagnosis not present

## 2018-10-22 DIAGNOSIS — H8113 Benign paroxysmal vertigo, bilateral: Secondary | ICD-10-CM | POA: Diagnosis not present

## 2018-10-22 NOTE — Therapy (Addendum)
[facility] [address] [place], [place], [address] Phone: [phone]   Fax:  [phone]  Physical Therapy Treatment  Patient Details  Name: Veronica Garza MRN: [id number] Date of Birth: Jan 23, 1941 Referring Provider (PT): Marrian Salvage, FNP   Encounter Date: 10/22/2018  PT End of Session - 10/22/18 1518    Visit Number  5    Number of Visits  7    Date for PT Re-Evaluation  11/04/18    Authorization Type  Medicare A and B; [employer] (no further insurance info entered re: copay or VL)    Authorization Time Period  09/22/18 to 12/22/2018    PT Start Time  1412    PT Stop Time  1457   PT Time Calculation (min)  45 minutes   Activity Tolerance  Treatment limited secondary to medical complications (Comment)   orthostasis during assessing the Berg   Behavior During Therapy  [facility] for tasks assessed/performed       Past Medical History:  Diagnosis Date  . Anemia   . Arthritis   . Cataract   . GERD (gastroesophageal reflux disease)   . Hx of shoulder replacement    lt  . Hypertension   . OSA (obstructive sleep apnea)   . Pneumonia    History of  . Urinary incontinence     Past Surgical History:  Procedure Laterality Date  . ABDOMINAL HYSTERECTOMY    . EYE SURGERY     bilateral cataract extraction  . NASAL SINUS SURGERY    . SHOULDER ARTHROSCOPY     Left  . TOTAL SHOULDER ARTHROPLASTY  10/10/2011   Procedure: TOTAL SHOULDER ARTHROPLASTY;  Surgeon: Metta Clines Supple;  Location: [place];  Service: Orthopedics;  Laterality: Right;  . UPPER GASTROINTESTINAL ENDOSCOPY    . Vaginal Sling      There were no vitals filed for this visit.  Subjective Assessment - 10/22/18 1419    Subjective  The patient reports she took Mucinex because of a cold and feels she may be dizzier.    She notes she walked to the mailbox and got an unsteady sensation in which "my head got really light."  She feels like she  could fall when this occurs.      Patient is accompained by:  Family member   spouse   Pertinent History  low sodium has caused her to pass out    Patient Stated Goals  to decrease her dizziness; to improve her balance and not fall    Currently in Pain?  No/denies                       [facility] Adult PT Treatment/Exercise - 10/22/18 1518      Ambulation/Gait   Ambulation/Gait  Yes    Ambulation/Gait Assistance  5: Supervision    Ambulation/Gait Assistance Details  patient has antalgic appearing L gait pattern; had patient ambulate with SPC R UE and discussed increasing distance initially beginning in the house to allow lightheadedness to settle (sit when needed), using Cane, and having husband assist her up/down level driveway.      Ambulation Distance (Feet)  150 Feet    Assistive device  None;Straight cane    Ambulation Surface  Level;Indoor      Self-Care   Self-Care  Other Self-Care Comments    Other Self-Care Comments   Provided CDC handout and discussed home walking + HEP.  Neuro Re-ed    Neuro Re-ed Details   Standing balance HEP including:  sidestepping x 10 feet x 4 reps at countertop with cues on foot position, standing mini squats x 10 reps, standing toe raises x 10 reps.  Seated ankle pumps due to unable to raise up on toes at countertop due to h/o L ankle pathology.  Standing balance with eyes open and eyes closed x 30 seconds, resting between reps.  Seated VOR x 1 viewing provokes dizziness when performed x 30 seconds.  Tried x 2 viewing and patient unable to coordinate.               PT Education - 10/22/18 1517    Education Details  HEP: sidestepping, ankle pumps seated, standing toe raises, mini squats, and seated VOR x 1 viewing.  ALSO:  provided CDC handout on postural hyotension.    Person(s) Educated  Patient;Spouse    Methods  Explanation;Demonstration;Handout    Comprehension  Returned demonstration;Verbalized understanding       PT  Short Term Goals - 10/15/18 1749      PT SHORT TERM GOAL #1   Title  Husband provides supervision with HEP for safety and accuracy of technique (Target for all STGs 10/14/2018)    Time  3    Period  Weeks    Status  Achieved      PT SHORT TERM GOAL #2   Title  After addressing vestibular deficits, will complete TUG assessment to further define degree of fall risk/imbalance.     Baseline  10/15/18 unable due to orthostatic hypotension during session    Time  3    Period  Weeks    Status  Unable to assess      PT SHORT TERM GOAL #3   Title  Patient will report no longer having spinning sensation when she moves from supine to sitting at EOB each morning.    Baseline  10/15/18 Pt reports she still has spinning each time, but the intensity and duration are less    Time  3    Period  Weeks    Status  Not Met      PT SHORT TERM GOAL #4   Title  Will assess Berg Balance test at 3 week assessment if appropriate (will add additional information to balance assessment and plan) and set LTG    Baseline  10/15/18 Unable to finish Merrilee Jansky due to her orthostasis    Time  3    Period  Weeks    Status  Unable to assess        PT Long Term Goals - 10/22/18 1534      PT LONG TERM GOAL #1   Title  Patient will be independent with HEP to address balance and safety with walking (LTGs target 11/04/2018)    Time  6    Period  Weeks    Status  On-going      PT LONG TERM GOAL #2   Title  Patient will decrease TUG normal by 3 seconds demonstrating progress with improved balance and gait.    Time  6    Period  Weeks    Status  On-going      PT LONG TERM GOAL #3   Title  Patient will ascend/descend curb with LRAD with supervision to allow incr independence in community ambulation    Time  6    Period  Weeks    Status  On-going  PT LONG TERM GOAL #4   Title  Patient will ambulate 150 ft on level surfaces with LRAD and supervision with no loss of balance that requires physical assist for her to  recover.     Time  6    Period  Weeks    Status  Achieved            Plan - 10/22/18 1535    Clinical Impression Statement  The patient was able to tolerate HEP for balance and we also discussed safety with walking.  PT plans to d/c next visit with HEP and education on safely increasing mobility with assist from family.      PT Treatment/Interventions  ADLs/Self Care Home Management;Canalith Repostioning;DME Instruction;Gait training;Neuromuscular re-education;Balance training;Therapeutic exercise;Therapeutic activities;Functional mobility training;Patient/family education;Manual techniques;Dry needling;Passive range of motion;Vestibular;Visual/perceptual remediation/compensation    PT Next Visit Plan  Check HEP from last visit; check LTGs; d/c next visit.     Consulted and Agree with Plan of Care  Patient;Family member/caregiver    Family Member Consulted  husband, [place]       Patient will benefit from skilled therapeutic intervention in order to improve the following deficits and impairments:  Abnormal gait, Decreased activity tolerance, Decreased balance, Decreased mobility, Decreased knowledge of use of DME, Decreased knowledge of precautions, Decreased range of motion, Dizziness, Increased fascial restricitons, Impaired flexibility, Postural dysfunction, Obesity  Visit Diagnosis: Dizziness and giddiness  Unsteadiness on feet  Difficulty in walking, not elsewhere classified     Problem List Patient Active Problem List   Diagnosis Date Noted  . Enlarged heart 06/16/2018  . Ataxia 01/01/2018  . Morbid obesity ([place]) 10/17/2017  . Cough 12/31/2016  . 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 Hot flashes, menopausal 12/31/2016  . DDD (degenerative disc disease), lumbar 11/06/2015  . Right-sided low back pain without sciatica 06/21/2015  . Diastolic dysfunction without heart failure 11/13/2014  . Other abnormal glucose 05/27/2013  . Osteopenia of the elderly 05/27/2013   . Hyperlipidemia LDL goal <130 09/04/2012  . Esophageal stricture 09/12/2011  . Visit for screening mammogram 08/08/2011  . Routine general medical examination at a health care facility 08/08/2011  . Essential hypertension, benign 01/14/2011  . URINARY INCONTINENCE 12/14/2008  . OA (osteoarthritis) 07/21/2008  . Obstructive sleep apnea 10/14/2007  . GERD 10/14/2007    Lord Lancour, PT 10/22/2018, 3:36 PM  [facility] [address] [place], [place], [address] Phone: [phone]   Fax:  [phone]  Name: Veronica Garza MRN: [id number] Date of Birth: 1941/07/13

## 2018-10-22 NOTE — Patient Instructions (Signed)
HOLD THE COUNTERTOP:  Side-Stepping    Walk to left side with eyes open. Take even steps, leading with same foot. Make sure each foot lifts off the floor. Repeat in opposite direction. Repeat for __10 steps along the countertop. Repeat 3 times up and down the length of your counter.  Do __1-2__ sessions per day.  Copyright  VHI. All rights reserved.   Toe Raise (Standing)    Rock back on heels. Repeat __10__ times per set. Do __2__ sets per session. Do __1-2__ sessions per day.  http://orth.exer.us/43   Copyright  VHI. All rights reserved.   Mini-Squats (Standing)    Stand with support. Bend knees slightly.  Hold for __5_ seconds. Return to straight standing.  Repeat 10 times.  Do 1-2 times/day. Copyright  VHI. All rights reserved.    ANKLE: Pump - Sitting    With involved leg straight, bend foot toward floor, then toward ceiling. Repeat _10__ times. Do __1-2_ times per day.  Copyright  VHI. All rights reserved.   Gaze Stabilization: Sitting    Keeping eyes on target on wall 3 feet away, and move head side to side for _30___ seconds.  Do __2-3__ sessions per day.  Copyright  VHI. All rights reserved.   Gaze Stabilization: Tip Card  1.Target must remain in focus, not blurry, and appear stationary while head is in motion. 2.Perform exercises with small head movements (45 to either side of midline). 3.Increase speed of head motion so long as target is in focus. 4.If you wear eyeglasses, be sure you can see target through lens (therapist will give specific instructions for bifocal / progressive lenses). 5.These exercises may provoke dizziness or nausea. Work through these symptoms. If too dizzy, slow head movement slightly. Rest between each exercise. 6.Exercises demand concentration; avoid distractions.  Copyright  VHI. All rights reserved.

## 2018-10-27 ENCOUNTER — Ambulatory Visit: Payer: Medicare Other | Admitting: Pulmonary Disease

## 2018-11-05 ENCOUNTER — Ambulatory Visit: Payer: Medicare Other | Attending: Family | Admitting: Physical Therapy

## 2018-11-05 ENCOUNTER — Encounter: Payer: Self-pay | Admitting: Physical Therapy

## 2018-11-05 DIAGNOSIS — R2681 Unsteadiness on feet: Secondary | ICD-10-CM | POA: Insufficient documentation

## 2018-11-05 DIAGNOSIS — R262 Difficulty in walking, not elsewhere classified: Secondary | ICD-10-CM | POA: Insufficient documentation

## 2018-11-05 DIAGNOSIS — R42 Dizziness and giddiness: Secondary | ICD-10-CM | POA: Insufficient documentation

## 2018-11-05 NOTE — Therapy (Signed)
[facility] [address] [place], [place], [address] Phone: [phone]   Fax:  [phone]  Physical Therapy Treatment  Patient Details  Name: Veronica Garza MRN: [id number] Date of Birth: 16-Oct-1941 Referring Provider (PT): Marrian Salvage, FNP   Encounter Date: 11/05/2018  PT End of Session - 11/05/18 1827    Visit Number  6    Number of Visits  11    Date for PT Re-Evaluation  01/04/19    Authorization Type  Medicare A and B; [employer] (no further insurance info entered re: copay or VL)    Authorization Time Period  09/22/18 to 12/22/2018; 11/05/18 to  01/04/19    PT Start Time  1405   late arrival   PT Stop Time  1448    PT Time Calculation (min)  43 min    Activity Tolerance  Patient tolerated treatment well    Behavior During Therapy  [facility] for tasks assessed/performed       Past Medical History:  Diagnosis Date  . Anemia   . Arthritis   . Cataract   . GERD (gastroesophageal reflux disease)   . Hx of shoulder replacement    lt  . Hypertension   . OSA (obstructive sleep apnea)   . Pneumonia    History of  . Urinary incontinence     Past Surgical History:  Procedure Laterality Date  . ABDOMINAL HYSTERECTOMY    . EYE SURGERY     bilateral cataract extraction  . NASAL SINUS SURGERY    . SHOULDER ARTHROSCOPY     Left  . TOTAL SHOULDER ARTHROPLASTY  10/10/2011   Procedure: TOTAL SHOULDER ARTHROPLASTY;  Surgeon: Metta Clines Supple;  Location: [facility];  Service: Orthopedics;  Laterality: Right;  . UPPER GASTROINTESTINAL ENDOSCOPY    . Vaginal Sling      There were no vitals filed for this visit.  Subjective Assessment - 11/05/18 1406    Subjective  Yesterday was a rough day. Tumbling feeling for most of yesterday. If she sat she felt fine. Did not try drinking water or consuming sodium (as MD recommended for vasovagal symptoms--although not clear it was vasovagal). States she is doing VOR x1  exercises 2x/day in sitting with minimal to no dizziness.     Patient is accompained by:  Family member   spouse   Pertinent History  low sodium has caused her to pass out    Patient Stated Goals  to decrease her dizziness; to improve her balance and not fall    Currently in Pain?  No/denies         [facility] PT Assessment - 11/05/18 0001      Standardized Balance Assessment   Standardized Balance Assessment  Timed Up and Go Test      Timed Up and Go Test   Normal TUG (seconds)  17.69   could not find previous measure to compare        Vestibular Assessment - 11/05/18 1815      Vestibulo-Occular Reflex   Comment  HIT causes symptoms similar to what she feels when she turns head quickly; unable to appreciate any deviation of gaze               [facility] Adult PT Treatment/Exercise - 11/05/18 0001      Ambulation/Gait   Ambulation/Gait Assistance  6: Modified independent (Device/Increase time)    Ambulation Distance (Feet)  100 Feet   100, 80   Assistive  device  None;Straight cane    Ramp  5: Supervision    Ramp Details (indicate cue type and reason)  pt using SPC with no difficulty ascend; anxious and required minguard assist with descending    Curb  4: Min assist    Curb Details (indicate cue type and reason)  indoor curb with SPC x 3 reps; minguard to ascend; min assist to minguard to descend and trialed various sequences with cane      Self-Care   Self-Care  Other Self-Care Comments    Other Self-Care Comments   Reviewed complexity of her situation and what symptoms are related to her vasovagal syncope and what symptoms are related to her hypofunction. Answered questions and educated pt re: how vestibular system works and why specific exercises and movement in general (including cervical/head movements)      Vestibular Treatment/Exercise - 11/05/18 1814      Vestibular Treatment/Exercise   Vestibular Treatment Provided  Gaze    Gaze Exercises  X1 Viewing Horizontal       X1 Viewing Horizontal   Foot Position  seated    Time  --   30 sed   Reps  3    Comments  vc for incr speed of head turns with pt reporting +dizziness            PT Education - 11/05/18 1825    Education Details  how vestibular system functions, symptoms related to her peripheral vestibular hypofunction; progress she has made with VORx1 exercise; potential benefits of continued PT at lower frequency to allow her time to work on exercises and return for progression of HEP    Person(s) Educated  Patient;Spouse    Methods  Explanation    Comprehension  Verbalized understanding       PT Short Term Goals - 10/15/18 1749      PT SHORT TERM GOAL #1   Title  Husband provides supervision with HEP for safety and accuracy of technique (Target for all STGs 10/14/2018)    Time  3    Period  Weeks    Status  Achieved      PT SHORT TERM GOAL #2   Title  After addressing vestibular deficits, will complete TUG assessment to further define degree of fall risk/imbalance.     Baseline  10/15/18 unable due to orthostatic hypotension during session    Time  3    Period  Weeks    Status  Unable to assess      PT SHORT TERM GOAL #3   Title  Patient will report no longer having spinning sensation when she moves from supine to sitting at EOB each morning.    Baseline  10/15/18 Pt reports she still has spinning each time, but the intensity and duration are less    Time  3    Period  Weeks    Status  Not Met      PT SHORT TERM GOAL #4   Title  Will assess Berg Balance test at 3 week assessment if appropriate (will add additional information to balance assessment and plan) and set LTG    Baseline  10/15/18 Unable to finish Merrilee Jansky due to her orthostasis    Time  3    Period  Weeks    Status  Unable to assess        PT Long Term Goals - 11/05/18 1829      PT LONG TERM GOAL #1   Title  Patient will  be independent with HEP to address balance and safety with walking (LTGs target  11/04/2018)    Baseline  11/05/18 required vc for technique (incr velocity) with VORx1    Time  6    Period  Weeks    Status  On-going      PT LONG TERM GOAL #2   Title  Patient will decrease TUG normal by 3 seconds demonstrating progress with improved balance and gait.    Baseline  11/05/18 No baseline TUG taken due to orthostasis on date it was TBA; 12/5 17.69 sec    Time  6    Period  Weeks    Status  Unable to assess   no baseline was taken previously     PT LONG TERM GOAL #3   Title  Patient will ascend/descend curb with LRAD with supervision to allow incr independence in community ambulation    Baseline  11/05/18 with SPC with triangle tip; minguard assist with cues for technique/sequencing    Time  6    Period  Weeks    Status  Not Met      PT LONG TERM GOAL #4   Title  Patient will ambulate 150 ft on level surfaces with LRAD and supervision with no loss of balance that requires physical assist for her to recover.     Time  6    Period  Weeks    Status  Achieved       NEW GOALS for RECERTIFICATION  PT Short Term Goals - 11/05/18 1918      PT SHORT TERM GOAL #1   Title  Patient able to tolerate standing VOR x 1 exercise with feet shoulder-width apart x 30 sec horizontally & vertically with dizziness <3/10--indicating improvement with vestibular hypofunction (Target for STGs 12/05/2018)    Time  4    Period  Weeks    Status  New      PT SHORT TERM GOAL #2   Title  Patient will complete TUG <=15 seconds to demonstrate lesser fall risk & improved balance.     Baseline  11/05/18 17.69 sec    Time  4    Period  Weeks    Status  New      PT SHORT TERM GOAL #3   Title  Patient will report spinning sensation will decr by 50% (intensity or duration) when she moves from supine to sitting at EOB.     Time  4    Period  Weeks    Status  New      PT Long Term Goals - 11/05/18 1924      PT LONG TERM GOAL #1   Title  Patient will be independent with HEP to address balance and  safety with walking (LTGs target 01/04/2019)    Time  8    Period  Weeks    Status  On-going      PT LONG TERM GOAL #2   Title  TUG <=13.5 seconds to indicate lesser fall risk.     Time  8    Period  Weeks    Status  New      PT LONG TERM GOAL #3   Title  Patient will ascend/descend curb (using exterior, actual curb) with LRAD with supervision to allow incr independence in community ambulation    Time  8    Period  Weeks    Status  On-going           Plan -  11/05/18 1843    Clinical Impression Statement  Patient returns after 2 weeks due to holiday schedule. She has been working on her exercises since last visit and has begun to see improvement (tolerating VOR x1 for 60 seconds without the target doubling, however mild dizziness). Still having the sensation that the world has to "catch up" when she turns her head quickly. LTGs assessed with pt meeting 1 of 4 goals, 1 goal not met, 1 goal is ongoing, and 1 goal could not be assessed as the baseline measure was not completed when STGs assessed. As pt had recent update to HEP and is making progress with her symptoms related to hypofunction, will plan to re-certify for an additional 4 visits over an 8 week period. A visit every 2 weeks will allow her to work through updates to her HEP and be ready for an update if symptoms continue to improve. Patient agreeable with this plan.     PT Frequency  Biweekly    PT Duration  8 weeks    PT Treatment/Interventions  ADLs/Self Care Home Management;Canalith Repostioning;DME Instruction;Gait training;Neuromuscular re-education;Balance training;Therapeutic exercise;Therapeutic activities;Functional mobility training;Patient/family education;Manual techniques;Dry needling;Passive range of motion;Vestibular;Visual/perceptual remediation/compensation    PT Next Visit Plan  Check HEP from 10/22/18 (save VOR x 1 for last and update to standing if able).    Consulted and Agree with Plan of Care  Patient;Family  member/caregiver    Family Member Consulted  husband, [place]       Patient will benefit from skilled therapeutic intervention in order to improve the following deficits and impairments:  Abnormal gait, Decreased activity tolerance, Decreased balance, Decreased mobility, Decreased knowledge of use of DME, Decreased knowledge of precautions, Decreased range of motion, Dizziness, Increased fascial restricitons, Impaired flexibility, Postural dysfunction, Obesity  Visit Diagnosis: Dizziness and giddiness  Unsteadiness on feet  Difficulty in walking, not elsewhere classified     Problem List Patient Active Problem List   Diagnosis Date Noted  . Enlarged heart 06/16/2018  . Ataxia 01/01/2018  . Morbid obesity ([place]) 10/17/2017  . Cough 12/31/2016  . 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 Hot flashes, menopausal 12/31/2016  . DDD (degenerative disc disease), lumbar 11/06/2015  . Right-sided low back pain without sciatica 06/21/2015  . Diastolic dysfunction without heart failure 11/13/2014  . Other abnormal glucose 05/27/2013  . Osteopenia of the elderly 05/27/2013  . Hyperlipidemia LDL goal <130 09/04/2012  . Esophageal stricture 09/12/2011  . Visit for screening mammogram 08/08/2011  . Routine general medical examination at a health care facility 08/08/2011  . Essential hypertension, benign 01/14/2011  . URINARY INCONTINENCE 12/14/2008  . OA (osteoarthritis) 07/21/2008  . Obstructive sleep apnea 10/14/2007  . GERD 10/14/2007    Rexanne Mano, PT 11/05/2018, 7:14 PM  [facility] [address] [place], [place], [address] Phone: [phone]   Fax:  [phone]  Name: Veronica Garza MRN: [id number] Date of Birth: 1941/10/03

## 2018-11-12 ENCOUNTER — Encounter: Payer: Medicare Other | Admitting: Physical Therapy

## 2018-11-16 ENCOUNTER — Encounter: Payer: Self-pay | Admitting: Pulmonary Disease

## 2018-11-16 ENCOUNTER — Ambulatory Visit (INDEPENDENT_AMBULATORY_CARE_PROVIDER_SITE_OTHER): Payer: Medicare Other | Admitting: Pulmonary Disease

## 2018-11-16 DIAGNOSIS — R059 Cough, unspecified: Secondary | ICD-10-CM

## 2018-11-16 DIAGNOSIS — R05 Cough: Secondary | ICD-10-CM | POA: Diagnosis not present

## 2018-11-16 DIAGNOSIS — J849 Interstitial pulmonary disease, unspecified: Secondary | ICD-10-CM

## 2018-11-16 DIAGNOSIS — G4733 Obstructive sleep apnea (adult) (pediatric): Secondary | ICD-10-CM

## 2018-11-16 MED ORDER — FUROSEMIDE 20 MG PO TABS: ORAL_TABLET | ORAL | 0 refills | Status: DC

## 2018-11-16 NOTE — Assessment & Plan Note (Signed)
We will treat as chronic diastolic heart failure with bipedal edema Start Lasix 20 mg daily for 2 weeks then every other day #30 Reassess response with office visit in 3 weeks

## 2018-11-16 NOTE — Patient Instructions (Signed)
CPAP is working well. Start Lasix 20 mg daily for 2 weeks then every other day #30  High-resolution CT chest without contrast

## 2018-11-16 NOTE — Assessment & Plan Note (Signed)
Chronic cough with bibasilar infiltrates on chest x-ray and crackles on exam, have to rule out ILD  Proceed with High-resolution CT chest without contrast

## 2018-11-16 NOTE — Assessment & Plan Note (Signed)
CPAP is working well.   Weight loss encouraged, compliance with goal of at least 4-6 hrs every night is the expectation. Advised against medications with sedative side effects Cautioned against driving when sleepy - understanding that sleepiness will vary on a day to day basis

## 2018-11-16 NOTE — Progress Notes (Signed)
   Subjective:    Patient ID: Veronica Garza, female    DOB: 1941/11/17, 77 y.o.   MRN: [id number]  HPI  77 yo  obese never smoker for FU of obstructive sleep apnea and chronic cough  She underwent Bilateral ethmoidectomy and maxillary sinus ostial enlargement with antrostomies with reduction of turbinates.by Dr Ernesto Rutherford 2008 and following surgery, CPAP pressure was decreased to 7 cm.   She reports ongoing cough since 05/2018 when she was last seen.  She was treated with Pepcid and then Prilosec for GERD and GERD symptoms are improved but cough persists. She reports dyspnea on exertion but not on routine activities. She reports bipedal edema that has been progressive for the past few months. Echo from 11/2014 shows grade 1 diastolic dysfunction and RVSP of 37  She was changed to DreamWear mask and she really likes it, no problems with pressure. CPAP download was reviewed which shows excellent compliance more than 6.5 hours every night with no residual events and minimal leak    Significant tests/ events reviewed  PSG 2007 >> severe OSA with AHI of 64 events per hour, wt195 pounds >>corrected by nasal CPAP of 13 cm.    Past Medical History:  Diagnosis Date  . Anemia   . Arthritis   . Cataract   . GERD (gastroesophageal reflux disease)   . Hx of shoulder replacement    lt  . Hypertension   . OSA (obstructive sleep apnea)   . Pneumonia    History of  . Urinary incontinence     Review of Systems neg for any significant sore throat, dysphagia, itching, sneezing, nasal congestion or excess/ purulent secretions, fever, chills, sweats, unintended wt loss, pleuritic or exertional cp, hempoptysis, orthopnea pnd or change in chronic leg swelling. Also denies presyncope, palpitations, heartburn, abdominal pain, nausea, vomiting, diarrhea or change in bowel or urinary habits, dysuria,hematuria, rash, arthralgias, visual complaints, headache, numbness weakness or ataxia.       Objective:   Physical Exam   Gen. Pleasant, obese, in no distress ENT - no lesions, no post nasal drip Neck: No JVD, no thyromegaly, no carotid bruits Lungs: no use of accessory muscles, no dullness to percussion, bibasal 1/3 rales no  rhonchi  Cardiovascular: Rhythm regular, heart sounds  normal, no murmurs or gallops, 2+ peripheral edema Musculoskeletal: No deformities, no cyanosis or clubbing , no tremors        Assessment & Plan:

## 2018-11-20 ENCOUNTER — Encounter: Payer: Self-pay | Admitting: Rehabilitative and Restorative Service Providers"

## 2018-11-20 ENCOUNTER — Ambulatory Visit: Payer: Medicare Other | Admitting: Rehabilitative and Restorative Service Providers"

## 2018-11-20 ENCOUNTER — Telehealth: Payer: Self-pay | Admitting: Pulmonary Disease

## 2018-11-20 DIAGNOSIS — R262 Difficulty in walking, not elsewhere classified: Secondary | ICD-10-CM | POA: Diagnosis not present

## 2018-11-20 DIAGNOSIS — R2681 Unsteadiness on feet: Secondary | ICD-10-CM

## 2018-11-20 DIAGNOSIS — R42 Dizziness and giddiness: Secondary | ICD-10-CM

## 2018-11-20 NOTE — Telephone Encounter (Signed)
Routing back to triage as requested by [place]

## 2018-11-20 NOTE — Telephone Encounter (Signed)
Call made to patient, patient states RA told her he wanted a Chest CT to be done within the next 3 weeks and to see BM. CT has not been scheduled and patient does not have a F/U appointment. Chest CT order states to schedule within next 3 months? I informed patient I would call back to schedule her F/U appointment.   TP please advise as RA is not in the office. Thanks.

## 2018-11-20 NOTE — Telephone Encounter (Signed)
Dr. Elsworth Soho please advise if you want the patient CT to be done in 3 weeks or 3 months. See previous message. Patient aware you are back in the office on Monday.   RA please advise. Thanks.

## 2018-11-20 NOTE — Patient Instructions (Signed)
Side-Stepping    Walk to left side with eyes open. Take even steps, leading with same foot. Make sure each foot lifts off the floor. Repeat in opposite direction. *Don't hold the countertop / stand near the counter.  Repeat for __10 steps along the countertop. Repeat 3 times up and down the length of your counter.  Do __1-2__ sessions per day.  Copyright  VHI. All rights reserved.   Toe / Heel Raise (Standing)    DO NOT LIFT HEELS ALL THE WAY UP-- just rock forward, then rock back to lift the toes.  Hold onto the counter.  Repeat __10-15__ times.  Copyright  VHI. All rights reserved.    Mini-Squats (Standing)    Stand with support. Bend knees slightly.  Hold for __5_ seconds. Return to straight standing.  Repeat 10 times.  Do 1-2 times/day. Copyright  VHI. All rights reserved.   Feet Apart, Head Motion - Eyes Open    With eyes open, feet apart, move head slowly: side to side. Repeat _10___ times per session. Do __1-2__ sessions per day.  Copyright  VHI. All rights reserved.    ANKLE: Circles    Shoes off for this one. Move feet in a circle clockwise, then counterclockwise. _10__ reps per set, _1__ sets per day, _7__ days per week   Copyright  VHI. All rights reserved.    Gaze Stabilization - Tip Card  1.Target must remain in focus, not blurry, and appear stationary while head is in motion. 2.Perform exercises with small head movements (45 to either side of midline). 3.Increase speed of head motion so long as target is in focus. 4.If you wear eyeglasses, be sure you can see target through lens (therapist will give specific instructions for bifocal / progressive lenses). 5.These exercises may provoke dizziness or nausea. Work through these symptoms. If too dizzy, slow head movement slightly. Rest between each exercise. 6.Exercises demand concentration; avoid distractions. 7.For safety, perform standing exercises close to a counter, wall, corner, or next  to someone.  Copyright  VHI. All rights reserved.   Gaze Stabilization - Standing Feet Apart   HAVE A CHAIR IN FRONT OF YOU AND HOLD ON WITH ONE HAND TO START. Feet shoulder width apart, keeping eyes on target on wall 3 feet away, tilt head down slightly and move head side to side for 60 seconds.  Do 2-3 sessions per day.   Copyright  VHI. All rights reserved.

## 2018-11-20 NOTE — Therapy (Signed)
[facility] [address] [place], [place], [address] Phone: [phone]   Fax:  [phone]  Physical Therapy Treatment  Patient Details  Name: Veronica Garza MRN: [id number] Date of Birth: [birth year] Referring Provider (PT): Marrian Salvage, FNP   Encounter Date: 11/20/2018  PT End of Session - 11/20/18 1027    Visit Number  7    Number of Visits  11    Date for PT Re-Evaluation  01/04/19    Authorization Type  Medicare A and B; [employer] (no further insurance info entered re: copay or VL)    Authorization Time Period  09/22/18 to 12/22/2018; 11/05/18 to  01/04/19    PT Start Time  1022    PT Stop Time  1102    PT Time Calculation (min)  40 min    Activity Tolerance  Patient tolerated treatment well    Behavior During Therapy  [facility] for tasks assessed/performed       Past Medical History:  Diagnosis Date  . Anemia   . Arthritis   . Cataract   . GERD (gastroesophageal reflux disease)   . Hx of shoulder replacement    lt  . Hypertension   . OSA (obstructive sleep apnea)   . Pneumonia    History of  . Urinary incontinence     Past Surgical History:  Procedure Laterality Date  . ABDOMINAL HYSTERECTOMY    . EYE SURGERY     bilateral cataract extraction  . NASAL SINUS SURGERY    . SHOULDER ARTHROSCOPY     Left  . TOTAL SHOULDER ARTHROPLASTY  10/10/2011   Procedure: TOTAL SHOULDER ARTHROPLASTY;  Surgeon: Metta Clines Supple;  Location: [place];  Service: Orthopedics;  Laterality: Right;  . UPPER GASTROINTESTINAL ENDOSCOPY    . Vaginal Sling      There were no vitals filed for this visit.  Subjective Assessment - 11/20/18 1026    Subjective  The patient has been doing HEP.  She notes that she went to [place] last week.  She notes fatigue with dizziness both limit her daily activities.      Patient is accompained by:  Family member    Diagnostic tests  reports no specific tests done by Dr. Redmond Baseman     Patient Stated Goals  to decrease her dizziness; to improve her balance and not fall    Currently in Pain?  No/denies             Vestibular Assessment - 11/20/18 1028      Positional Testing   Sidelying Test  Sidelying Right;Sidelying Left      Sidelying Right   Sidelying Right Duration  none    Sidelying Right Symptoms  No nystagmus      Sidelying Left   Sidelying Left Duration  none    Sidelying Left Symptoms  No nystagmus               [facility] Adult PT Treatment/Exercise - 11/20/18 1030      Ambulation/Gait   Ambulation/Gait  Yes    Ambulation/Gait Assistance  6: Modified independent (Device/Increase time)    Ambulation/Gait Assistance Details  Patient initially had short stride length-- this improved with gait in the clinic.    Ambulation Distance (Feet)  200 Feet   x 3 reps   Assistive device  None    Ambulation Surface  Level;Indoor      Self-Care   Self-Care  Other Self-Care Comments  Other Self-Care Comments   The patient gets symptoms only when fatigued and describes it as a sensation of her head moving, not the room moving.   Symptoms lasts various times from 10 minutes or longer.  She has to go lay down and notes light sensitivity at the time.  "I have crazy vision" describing macular degeneration.  She gets floaters in her visual field.       Neuro Re-ed    Neuro Re-ed Details   Standing side stepping dec'ing UE support.    Standing horizontal head motion in corner with chair near for safety.        Exercises   Exercises  Other Exercises    Other Exercises   Seated ankle circles and AAROM for L ankle (using R foot).  PT attempted PROM L ankle iwth pain with movement.  Patient reports that she had surgeries and 3 casts 10 years ago and has limited mobility from this.  PT discussed ROM okay to perform to maintain moblity.  Reviewed squats x 10 reps and heel/toe raises (did not raise heels high, just shifted anteriorly).      Vestibular  Treatment/Exercise - 11/20/18 1030      Vestibular Treatment/Exercise   Vestibular Treatment Provided  Gaze      X1 Viewing Horizontal   Foot Position  standing    Reps  3    Comments  x 30 seconds initially with 2 hand hold support dec'ing to 1 handheld support.              PT Education - 11/20/18 1458    Education Details  Modified VOR to standing and made other changes to current HEP.  See printout    Person(s) Educated  Patient;Spouse    Methods  Explanation;Demonstration;Handout    Comprehension  Returned demonstration;Verbalized understanding       PT Short Term Goals - 11/20/18 1459      PT SHORT TERM GOAL #1   Title  Patient able to tolerate standing VOR x 1 exercise with feet shoulder-width apart x 30 sec horizontally & vertically with dizziness <3/10--indicating improvement with vestibular hypofunction (Target for STGs 12/05/2018)    Time  4    Period  Weeks    Status  New    Target Date  12/05/18      PT SHORT TERM GOAL #2   Title  Patient will complete TUG <=15 seconds to demonstrate lesser fall risk & improved balance.     Baseline  11/05/18 17.69 sec    Time  4    Period  Weeks    Status  New      PT SHORT TERM GOAL #3   Title  Patient will report spinning sensation will decr by 50% (intensity or duration) when she moves from supine to sitting at EOB.     Time  4    Period  Weeks    Status  New        PT Long Term Goals - 11/05/18 1924      PT LONG TERM GOAL #1   Title  Patient will be independent with HEP to address balance and safety with walking (LTGs target 01/04/2019)    Time  8    Period  Weeks    Status  On-going      PT LONG TERM GOAL #2   Title  TUG <=13.5 seconds to indicate lesser fall risk.     Time  8    Period  Weeks    Status  New      PT LONG TERM GOAL #3   Title  Patient will ascend/descend curb (using exterior, actual curb) with LRAD with supervision to allow incr independence in community ambulation    Time  8    Period   Weeks    Status  On-going            Plan - 11/20/18 1504    Clinical Impression Statement  The patient is tolerating exercises and PT able to progress HEP to standing VOR, dec'ing UE support with sidestepping, adding anterior weight shift during toe/beginning to lift heels raises.  Plan to check STGs next visit and continue to progress walking and HEP.     PT Treatment/Interventions  ADLs/Self Care Home Management;Canalith Repostioning;DME Instruction;Gait training;Neuromuscular re-education;Balance training;Therapeutic exercise;Therapeutic activities;Functional mobility training;Patient/family education;Manual techniques;Dry needling;Passive range of motion;Vestibular;Visual/perceptual remediation/compensation    PT Next Visit Plan  Do standing VOR vertical plane, check HEP, check STGs, progress walking distance.    Consulted and Agree with Plan of Care  Patient;Family member/caregiver    Family Member Consulted  husband, [place]       Patient will benefit from skilled therapeutic intervention in order to improve the following deficits and impairments:  Abnormal gait, Decreased activity tolerance, Decreased balance, Decreased mobility, Decreased knowledge of use of DME, Decreased knowledge of precautions, Decreased range of motion, Dizziness, Increased fascial restricitons, Impaired flexibility, Postural dysfunction, Obesity  Visit Diagnosis: Dizziness and giddiness  Unsteadiness on feet  Difficulty in walking, not elsewhere classified     Problem List Patient Active Problem List   Diagnosis Date Noted  . Enlarged heart 06/16/2018  . Ataxia 01/01/2018  . Morbid obesity ([place]) 10/17/2017  . Cough 12/31/2016  . 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 Hot flashes, menopausal 12/31/2016  . DDD (degenerative disc disease), lumbar 11/06/2015  . Right-sided low back pain without sciatica 06/21/2015  . Diastolic dysfunction without heart failure 11/13/2014  . Other abnormal  glucose 05/27/2013  . Osteopenia of the elderly 05/27/2013  . Hyperlipidemia LDL goal <130 09/04/2012  . Esophageal stricture 09/12/2011  . Visit for screening mammogram 08/08/2011  . Routine general medical examination at a health care facility 08/08/2011  . Essential hypertension, benign 01/14/2011  . URINARY INCONTINENCE 12/14/2008  . OA (osteoarthritis) 07/21/2008  . Obstructive sleep apnea 10/14/2007  . GERD 10/14/2007    Ransom Nickson , PT 11/20/2018, 3:05 PM  [facility] [address] [place], [place], [address] Phone: [phone]   Fax:  [phone]  Name: Veronica Garza MRN: [id number] Date of Birth: 11/03/1941

## 2018-11-23 NOTE — Telephone Encounter (Signed)
Within 1 week please.

## 2018-11-23 NOTE — Telephone Encounter (Signed)
Noted. PCCs, can you please help [place] out with this. Pt needs CT to be scheduled within 1 week per RA.  After pt has CT scheduled, pt will then need to be scheduled f/u appt.

## 2018-11-23 NOTE — Telephone Encounter (Signed)
pcc this will work, please schedule for patient thank you.

## 2018-11-23 NOTE — Telephone Encounter (Signed)
1st thing Veronica Garza had was 12/07/18 arrive at 1;45pm no prep will this be ok

## 2018-11-23 NOTE — Telephone Encounter (Signed)
Okay 

## 2018-11-23 NOTE — Telephone Encounter (Signed)
Pt aware of appt.

## 2018-11-23 NOTE — Telephone Encounter (Signed)
Dr. Elsworth Soho, please advise if 12/07/18 is okay for pt to have the CT as it was the first thing avail?

## 2018-11-30 ENCOUNTER — Other Ambulatory Visit: Payer: Self-pay | Admitting: Internal Medicine

## 2018-12-04 ENCOUNTER — Encounter: Payer: Self-pay | Admitting: Physical Therapy

## 2018-12-04 ENCOUNTER — Ambulatory Visit: Payer: Medicare Other | Attending: Family | Admitting: Physical Therapy

## 2018-12-04 DIAGNOSIS — R42 Dizziness and giddiness: Secondary | ICD-10-CM | POA: Diagnosis not present

## 2018-12-04 DIAGNOSIS — R2681 Unsteadiness on feet: Secondary | ICD-10-CM | POA: Diagnosis not present

## 2018-12-04 DIAGNOSIS — R262 Difficulty in walking, not elsewhere classified: Secondary | ICD-10-CM | POA: Insufficient documentation

## 2018-12-04 NOTE — Patient Instructions (Signed)
Feet Apart, Head Motion - Eyes Open    Stand with your back to the corner and a chair back in front of you for safety. With eyes open, feet apart, move head slowly left and right Repeat _10___ times per session. Do ___1_ sessions per day.  Copyright  VHI. All rights reserved.

## 2018-12-05 NOTE — Therapy (Signed)
[facility] [address] [place], [place], [address] Phone: [phone]   Fax:  [phone]  Physical Therapy Treatment  Patient Details  Name: Veronica Garza MRN: [id number] Date of Birth: 01/12/1941 Referring Provider (PT): Marrian Salvage, FNP   Encounter Date: 12/04/2018  PT End of Session - 12/04/18 1700    Visit Number  8    Number of Visits  11    Date for PT Re-Evaluation  01/04/19    Authorization Type  Medicare A and B; [employer] (no further insurance info entered re: copay or VL)    Authorization Time Period  09/22/18 to 12/22/2018; 11/05/18 to  01/04/19    PT Start Time  1315    PT Stop Time  1358    PT Time Calculation (min)  43 min    Activity Tolerance  Patient tolerated treatment well    Behavior During Therapy  [facility] for tasks assessed/performed       Past Medical History:  Diagnosis Date  . Anemia   . Arthritis   . Cataract   . GERD (gastroesophageal reflux disease)   . Hx of shoulder replacement    lt  . Hypertension   . OSA (obstructive sleep apnea)   . Pneumonia    History of  . Urinary incontinence     Past Surgical History:  Procedure Laterality Date  . ABDOMINAL HYSTERECTOMY    . EYE SURGERY     bilateral cataract extraction  . NASAL SINUS SURGERY    . SHOULDER ARTHROSCOPY     Left  . TOTAL SHOULDER ARTHROPLASTY  10/10/2011   Procedure: TOTAL SHOULDER ARTHROPLASTY;  Surgeon: Metta Clines Supple;  Location: [facility];  Service: Orthopedics;  Laterality: Right;  . UPPER GASTROINTESTINAL ENDOSCOPY    . Vaginal Sling      There were no vitals filed for this visit.  Subjective Assessment - 12/04/18 1318    Subjective  Has been working on her left ankle ROM and has improved. The pain when walking is about the same but she feels she is moving faster due to the incr ROM. When I'm really busy and moving around a lot I still get dizzy but it doesn't seem to last as long. (doesn't have  to lie down as long). Doing exercises as prescribed.     Patient is accompained by:  Family member    Diagnostic tests  reports no specific tests done by Dr. Redmond Baseman    Patient Stated Goals  to decrease her dizziness; to improve her balance and not fall    Currently in Pain?  No/denies   just normal arthritis pain        [facility] PT Assessment - 12/04/18 1700      Timed Up and Go Test   Normal TUG (seconds)  13.1                    Vestibular Treatment/Exercise - 12/04/18 1700      Vestibular Treatment/Exercise   Vestibular Treatment Provided  Gaze;Habituation    Habituation Exercises  Standing Horizontal Head Turns    Gaze Exercises  X1 Viewing Horizontal      Standing Horizontal Head Turns   Number of Reps   10    Symptom Description   has to move slowly to not feel like things are moving/catching up      X1 Viewing Horizontal   Foot Position  standing    Time  --  25 seconds no UE support   Reps  3         Balance Exercises - 12/04/18 1700      Balance Exercises: Standing   Standing Eyes Opened  Wide (BOA);Head turns;Foam/compliant surface;Solid surface    Standing Eyes Closed  Wide (BOA);Foam/compliant surface;Solid surface;Head turns    Gait with Head Turns  Forward        PT Education - 12/05/18 0720    Education Details  added to HEP; results of STGs and good progress being made    Person(s) Educated  Patient    Methods  Explanation;Demonstration;Verbal cues;Handout    Comprehension  Verbalized understanding;Returned demonstration;Verbal cues required       PT Short Term Goals - 12/04/18 1332      PT SHORT TERM GOAL #1   Title  Patient able to tolerate standing VOR x 1 exercise with feet shoulder-width apart x 30 sec horizontally & vertically with dizziness <3/10--indicating improvement with vestibular hypofunction (Target for STGs 12/05/2018)    Baseline  12/04/18 only doing horiz due to neck pain; 25 seconds with 3/10 dizziness but felt  off-balance and reached for chair    Time  4    Period  Weeks    Status  Partially Met      PT SHORT TERM GOAL #2   Title  Patient will complete TUG <=15 seconds to demonstrate lesser fall risk & improved balance.     Baseline  11/05/18 17.69 sec; 12/04/18 13.1 sec    Time  4    Period  Weeks    Status  Achieved      PT SHORT TERM GOAL #3   Title  Patient will report spinning sensation will decr by 50% (intensity or duration) when she moves from supine to sitting at EOB.     Baseline  12/04/18 decr 50 %     Time  4    Period  Weeks    Status  Achieved        PT Long Term Goals - 12/04/18 1700      PT LONG TERM GOAL #1   Title  Patient will be independent with HEP to address balance and safety with walking (LTGs target 01/04/2019)    Time  8    Period  Weeks    Status  On-going      PT LONG TERM GOAL #2   Title  TUG <=13.5 seconds to indicate lesser fall risk.     Baseline  12/04/18 13.1 sec    Time  8    Period  Weeks    Status  Achieved      PT LONG TERM GOAL #3   Title  Patient will ascend/descend curb (using exterior, actual curb) with LRAD with supervision to allow incr independence in community ambulation    Time  8    Period  Weeks    Status  On-going            Plan - 12/04/18 1700    Clinical Impression Statement  STGs assessed with pt achieving 2 of 3 goals and 3rd goal partially met (improved however not to goal level). Patient has worked diligently on left ankle ROM since last visit and has had significant improvement. She reports she can feel improved gait velocity and balance during gait due to this. She reports improvement in all areas and is very pleased with her progress. Will continue with biweekly frequency for remaining 4 weeks to work towards  LTGs.     Rehab Potential  Good    Clinical Impairments Affecting Rehab Potential  pt's obesity, limited cervical ROM , and pain in left sidelying due to left shoulder replacement make cannalith repositioning  maneuvers difficult due to cannot achieve needed positioning    PT Frequency  Biweekly    PT Duration  8 weeks    PT Treatment/Interventions  ADLs/Self Care Home Management;Canalith Repostioning;DME Instruction;Gait training;Neuromuscular re-education;Balance training;Therapeutic exercise;Therapeutic activities;Functional mobility training;Patient/family education;Manual techniques;Dry needling;Passive range of motion;Vestibular;Visual/perceptual remediation/compensation    PT Next Visit Plan  work on curb (outside if weather permits) with hurrycane; progress walking distance; ?add another habituation ex to HEP    Consulted and Agree with Plan of Care  Patient    Family Member Consulted  --       Patient will benefit from skilled therapeutic intervention in order to improve the following deficits and impairments:  Abnormal gait, Decreased activity tolerance, Decreased balance, Decreased mobility, Decreased knowledge of use of DME, Decreased knowledge of precautions, Decreased range of motion, Dizziness, Increased fascial restricitons, Impaired flexibility, Postural dysfunction, Obesity  Visit Diagnosis: Dizziness and giddiness  Unsteadiness on feet  Difficulty in walking, not elsewhere classified     Problem List Patient Active Problem List   Diagnosis Date Noted  . Enlarged heart 06/16/2018  . Ataxia 01/01/2018  . Morbid obesity ([place]) 10/17/2017  . Cough 12/31/2016  . 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 Hot flashes, menopausal 12/31/2016  . DDD (degenerative disc disease), lumbar 11/06/2015  . Right-sided low back pain without sciatica 06/21/2015  . Diastolic dysfunction without heart failure 11/13/2014  . Other abnormal glucose 05/27/2013  . Osteopenia of the elderly 05/27/2013  . Hyperlipidemia LDL goal <130 09/04/2012  . Esophageal stricture 09/12/2011  . Visit for screening mammogram 08/08/2011  . Routine general medical examination at a health care facility  08/08/2011  . Essential hypertension, benign 01/14/2011  . URINARY INCONTINENCE 12/14/2008  . OA (osteoarthritis) 07/21/2008  . Obstructive sleep apnea 10/14/2007  . GERD 10/14/2007    Rexanne Mano, PT 12/05/2018, 7:28 AM  [facility] [address] [place], [place], [address] Phone: [phone]   Fax:  [phone]  Name: DOREATHA OFFER MRN: [id number] Date of Birth: 15-Dec-1940

## 2018-12-07 ENCOUNTER — Ambulatory Visit (INDEPENDENT_AMBULATORY_CARE_PROVIDER_SITE_OTHER)
Admission: RE | Admit: 2018-12-07 | Discharge: 2018-12-07 | Disposition: A | Discharge status: Home / Self Care | Payer: Medicare Other | Source: Ambulatory Visit | Attending: Pulmonary Disease | Admitting: Pulmonary Disease

## 2018-12-07 DIAGNOSIS — J984 Other disorders of lung: Secondary | ICD-10-CM | POA: Diagnosis not present

## 2018-12-07 DIAGNOSIS — J849 Interstitial pulmonary disease, unspecified: Secondary | ICD-10-CM | POA: Diagnosis not present

## 2018-12-14 ENCOUNTER — Other Ambulatory Visit: Payer: Self-pay | Admitting: Nurse Practitioner

## 2018-12-14 ENCOUNTER — Ambulatory Visit: Payer: Self-pay

## 2018-12-14 DIAGNOSIS — Z20828 Contact with and (suspected) exposure to other viral communicable diseases: Secondary | ICD-10-CM

## 2018-12-14 MED ORDER — OSELTAMIVIR PHOSPHATE 75 MG PO CAPS: 75.0000 mg | ORAL_CAPSULE | Freq: Every day | ORAL | 0 refills | Status: DC

## 2018-12-14 NOTE — Telephone Encounter (Signed)
Pt. Reported called to report symptoms for her husband of cough, body aches, chills, and overall not feeling well.  Appt. For husband was scheduled at [facility] at 3:30 PM today.  Reported their son was diagnosed with the flu today.  Is asking if she should receive an antiviral medication, as a preventative treatment?  Advised will send note to her PCP re: recommendation.  Agrees with plan.     Reason for Disposition . [1] Influenza EXPOSURE (Close Contact) within last 48 hours (2 days) AND [2] exposed person is HIGH RISK (e.g., age > 80 years, pregnant, HIV+, chronic medical condition)  Answer Assessment - Initial Assessment Questions 1. TYPE of EXPOSURE: "How were you exposed?" (e.g., close contact, not a close contact)     Exposed to son yesterday by close contact; he was diagnosed with flu today.  Also, husband has c/o cough, chills, and body aches, since 1/11.    2. DATE of EXPOSURE: "When did the exposure occur?" (e.g., hour, days, weeks)     Son 12/13/18; husband sick since 12/12/18  3. PREGNANCY: "Is there any chance you are pregnant?" "When was your last menstrual period?"    N/a  4. HIGH RISK for COMPLICATIONS: "Do you have any heart or lung problems? Do you have a weakened immune system?" (e.g., CHF, COPD, asthma, HIV positive, chemotherapy, renal failure, diabetes mellitus, sickle cell anemia)     Denied  5. SYMPTOMS: "Do you have any symptoms?" (e.g., cough, fever, sore throat, difficulty breathing).    Denied feeling any symptoms.  Protocols used: INFLUENZA EXPOSURE-A-AH

## 2018-12-14 NOTE — Progress Notes (Signed)
Husband: Arlanda Shiplett was seen today with flu like symptoms. Her son tested positive for influenza.

## 2018-12-15 ENCOUNTER — Encounter: Payer: Self-pay | Admitting: Pulmonary Disease

## 2018-12-15 ENCOUNTER — Other Ambulatory Visit: Payer: Self-pay | Admitting: Internal Medicine

## 2018-12-15 ENCOUNTER — Ambulatory Visit (INDEPENDENT_AMBULATORY_CARE_PROVIDER_SITE_OTHER): Payer: Medicare Other | Admitting: Pulmonary Disease

## 2018-12-15 VITALS — BP 138/88 | HR 82 | Ht 59.0 in | Wt 211.2 lb

## 2018-12-15 DIAGNOSIS — J849 Interstitial pulmonary disease, unspecified: Secondary | ICD-10-CM | POA: Diagnosis not present

## 2018-12-15 DIAGNOSIS — I5189 Other ill-defined heart diseases: Secondary | ICD-10-CM

## 2018-12-15 DIAGNOSIS — Z20828 Contact with and (suspected) exposure to other viral communicable diseases: Secondary | ICD-10-CM

## 2018-12-15 DIAGNOSIS — G4733 Obstructive sleep apnea (adult) (pediatric): Secondary | ICD-10-CM

## 2018-12-15 LAB — BASIC METABOLIC PANEL
BUN: 16 mg/dL (ref 6–23)
CHLORIDE: 100 meq/L (ref 96–112)
CO2: 30 mEq/L (ref 19–32)
Calcium: 9.4 mg/dL (ref 8.4–10.5)
Creatinine, Ser: 0.72 mg/dL (ref 0.40–1.20)
GFR: 83.67 mL/min (ref >60.00)
Glucose, Bld: 125 mg/dL — ABNORMAL HIGH (ref 70–99)
POTASSIUM: 3.9 meq/L (ref 3.5–5.1)
SODIUM: 137 meq/L (ref 135–145)

## 2018-12-15 NOTE — Progress Notes (Signed)
   Subjective:    Patient ID: Veronica Garza, female    DOB: 1941/01/16, 78 y.o.   MRN: [id number]  HPI  78 yo  obese never smoker for FU of obstructive sleep apnea and ILD  she presented with a chronic cough since 05/2018  She underwent Bilateral ethmoidectomy and maxillary sinus ostial enlargement with antrostomies with reduction of turbinates.by Dr Ernesto Rutherford 2008 and following surgery, CPAP pressure was decreased to 7 cm.   Chief Complaint  Patient presents with  . Follow-up    F/U after CT scan. Per patient, breathing has been ok since last visit.    She was given Lasix and pedal edema has significantly improved, weight dropped by 7 pounds, her cough is significantly improved as is her breathing.  But she still gives out after walking for short distance, saturation at 92% today.  She is compliant with her CPAP and this is working well, no problems with mask or pressure, she is compliant and denies sleep pressure Due to bibasilar crackles, high-resolution CT chest was scheduled and we reviewed this in detail today   Significant tests/ events reviewed  PSG2007 >>severe OSA with AHI of 64 events per hour, wt195 pounds>>corrected by nasal CPAP of 13 cm.  Echo from 11/2014 shows grade 1 diastolic dysfunction and RVSP of 37 Spirometry 05/2018 shows ratio of 78, FEV1 of 70%, FVC of 67% >> moderate restriction  HRCT 12/2018 >> poor study due to resp motion , mild sub pleural reticulation in mid-lower zones  Past Medical History:  Diagnosis Date  . Anemia   . Arthritis   . Cataract   . GERD (gastroesophageal reflux disease)   . Hx of shoulder replacement    lt  . Hypertension   . OSA (obstructive sleep apnea)   . Pneumonia    History of  . Urinary incontinence      Review of Systems neg for any significant sore throat, dysphagia, itching, sneezing, nasal congestion or excess/ purulent secretions, fever, chills, sweats, unintended wt loss, pleuritic or exertional cp,  hempoptysis, orthopnea pnd or change in chronic leg swelling. Also denies presyncope, palpitations, heartburn, abdominal pain, nausea, vomiting, diarrhea or change in bowel or urinary habits, dysuria,hematuria, rash, arthralgias, visual complaints, headache, numbness weakness or ataxia.     Objective:   Physical Exam Gen. Pleasant, obese, in no distress ENT - no lesions, no post nasal drip Neck: No JVD, no thyromegaly, no carotid bruits Lungs: no use of accessory muscles, no dullness to percussion, bibasal fine rales no rhonchi  Cardiovascular: Rhythm regular, heart sounds  normal, no murmurs or gallops, 1+ peripheral edema Musculoskeletal: No deformities, no cyanosis or clubbing , no tremors Varicose veins BLEs        Assessment & Plan:

## 2018-12-15 NOTE — Telephone Encounter (Signed)
Why was this sent to me? This was taken care of yesterday  TJ

## 2018-12-15 NOTE — Assessment & Plan Note (Signed)
CPAP seems to be working well at current levels and she is compliant  Weight loss encouraged, compliance with goal of at least 4-6 hrs every night is the expectation. Advised against medications with sedative side effects Cautioned against driving when sleepy - understanding that sleepiness will vary on a day to day basis

## 2018-12-15 NOTE — Assessment & Plan Note (Signed)
Pedal edema is markedly improved with Lasix and we have she will continue this

## 2018-12-15 NOTE — Addendum Note (Signed)
Addended by: Valerie Salts on: 12/15/2018 12:13 PM   Modules accepted: Orders

## 2018-12-15 NOTE — Telephone Encounter (Signed)
Do you want to treat pt prophylactically? Please advise.

## 2018-12-15 NOTE — Assessment & Plan Note (Signed)
She does have subpleural reticulation but unfortunately high-resolution CT chest was a poor study due to respiratory motion and will have to be repeated in 3 months  We will check blood work for inflammatory etiologies including CCP and ENA Full PFTs will also be scheduled

## 2018-12-15 NOTE — Patient Instructions (Signed)
You have some buildup of scar tissue in your lungs -this may be related to inflammation or other cause.  Blood work today  Stay on Lasix 20 mg every other day  Schedule full PFTs. Repeat high-resolution CT chest in 3 months before next appointment

## 2018-12-15 NOTE — Addendum Note (Signed)
Addended by: Valerie Salts on: 12/15/2018 04:55 PM   Modules accepted: Orders

## 2018-12-15 NOTE — Addendum Note (Signed)
Addended by: Raliegh Ip on: 12/15/2018 12:15 PM   Modules accepted: Orders

## 2018-12-16 LAB — ANGIOTENSIN CONVERTING ENZYME: ANGIOTENSIN-CONVERTING ENZYME: 69 U/L — AB (ref 9–67)

## 2018-12-16 LAB — CYCLIC CITRUL PEPTIDE ANTIBODY, IGG: Cyclic Citrullin Peptide Ab: <16 UNITS

## 2018-12-17 ENCOUNTER — Telehealth: Payer: Self-pay | Admitting: Pulmonary Disease

## 2018-12-17 LAB — ANA+ENA+DNA/DS+SCL 70+SJOSSA/B
ANA Titer 1: NEGATIVE
Scleroderma SCL-70: <0.2 AI (ref 0.0–0.9)
dsDNA Ab: <1 IU/mL (ref 0–9)

## 2018-12-17 NOTE — Telephone Encounter (Signed)
Advised pt of results. Pt understood and nothing further is needed.    Notes recorded by Rigoberto Noel, MD on 12/16/2018 at 1:40 PM EST Blood test for inflammatory causes neg

## 2018-12-18 ENCOUNTER — Encounter: Payer: Self-pay | Admitting: Physical Therapy

## 2018-12-18 ENCOUNTER — Ambulatory Visit: Payer: Medicare Other | Admitting: Physical Therapy

## 2018-12-18 DIAGNOSIS — R2681 Unsteadiness on feet: Secondary | ICD-10-CM | POA: Diagnosis not present

## 2018-12-18 DIAGNOSIS — R42 Dizziness and giddiness: Secondary | ICD-10-CM | POA: Diagnosis not present

## 2018-12-18 DIAGNOSIS — R262 Difficulty in walking, not elsewhere classified: Secondary | ICD-10-CM | POA: Diagnosis not present

## 2018-12-18 NOTE — Therapy (Signed)
[facility] [address] [place], [place], [address] Phone: [phone]   Fax:  [phone]  Physical Therapy Treatment  Patient Details  Name: Veronica Garza MRN: [id number] Date of Birth: 04-01-1941 Referring Provider (PT): Marrian Salvage, FNP   Encounter Date: 12/18/2018  PT End of Session - 12/18/18 1316    Visit Number  9    Number of Visits  11    Date for PT Re-Evaluation  01/04/19    Authorization Type  Medicare A and B; [employer] (no further insurance info entered re: copay or VL)    Authorization Time Period  09/22/18 to 12/22/2018; 11/05/18 to  01/04/19    PT Start Time  1316    PT Stop Time  1400    PT Time Calculation (min)  44 min    Activity Tolerance  Patient tolerated treatment well    Behavior During Therapy  [facility] for tasks assessed/performed       Past Medical History:  Diagnosis Date  . Anemia   . Arthritis   . Cataract   . GERD (gastroesophageal reflux disease)   . Hx of shoulder replacement    lt  . Hypertension   . OSA (obstructive sleep apnea)   . Pneumonia    History of  . Urinary incontinence     Past Surgical History:  Procedure Laterality Date  . ABDOMINAL HYSTERECTOMY    . EYE SURGERY     bilateral cataract extraction  . NASAL SINUS SURGERY    . SHOULDER ARTHROSCOPY     Left  . TOTAL SHOULDER ARTHROPLASTY  10/10/2011   Procedure: TOTAL SHOULDER ARTHROPLASTY;  Surgeon: Metta Clines Supple;  Location: [facility];  Service: Orthopedics;  Laterality: Right;  . UPPER GASTROINTESTINAL ENDOSCOPY    . Vaginal Sling      There were no vitals filed for this visit.  Subjective Assessment - 12/18/18 1317    Subjective  Things seem to be better. I still have times where I feel whew (things will spin) and wonder how that happened. Feels comfortable walking to the mailbox without a cane. Rarely now has to lie down due to dizziness. Some days feels more off-balance (usually when has  been very busy).     Patient is accompained by:  Family member    Diagnostic tests  reports no specific tests done by Dr. Redmond Baseman    Patient Stated Goals  to decrease her dizziness; to improve her balance and not fall    Currently in Pain?  No/denies                       [facility] Adult PT Treatment/Exercise - 12/18/18 1405      Ambulation/Gait   Ambulation/Gait  Yes    Ambulation/Gait Assistance  6: Modified independent (Device/Increase time)    Ambulation/Gait Assistance Details  Patient walks in without her cane! She reports she still takes it when she is going somewhere unfamiliar or will be standing/walking alot.     Ambulation Distance (Feet)  100 Feet   300 (including outside to practice curbs)   Assistive device  Straight cane;None    Gait Pattern  Step-through pattern;Decreased arm swing - right;Decreased arm swing - left;Wide base of support    Ambulation Surface  Level;Indoor;Outdoor;Paved    Curb  4: Min assist;Other (comment)   minguard   Curb Details (indicate cue type and reason)  with cane x 10 reps; pt ascends  with minguard (places cane RUE up on curb, then steps up with LLE and steps through with RLE); descends initially with feet turned to left, places cane (RUE) down first (but close to curb) then steps down right foot (also close to curb) and then left foot; educated in descending forward facing with cane placed further away from curb, then left foot and right foot to step through (pt progressed from min to minguard assist with this technique). Attempted to teach pt to have rt forefoot over edge of curb, so as left foot descends she "rocks" rt foot as she lowers onto left foot, however this did not feel comfortable to her.           Balance Exercises - 12/18/18 1418      Balance Exercises: Standing   Standing Eyes Opened  Narrow base of support (BOS);Wide (BOA);Head turns;Solid surface    Standing Eyes Closed  Wide (BOA);Solid surface;20 secs           PT Short Term Goals - 12/04/18 1332      PT SHORT TERM GOAL #1   Title  Patient able to tolerate standing VOR x 1 exercise with feet shoulder-width apart x 30 sec horizontally & vertically with dizziness <3/10--indicating improvement with vestibular hypofunction (Target for STGs 12/05/2018)    Baseline  12/04/18 only doing horiz due to neck pain; 25 seconds with 3/10 dizziness but felt off-balance and reached for chair    Time  4    Period  Weeks    Status  Partially Met      PT SHORT TERM GOAL #2   Title  Patient will complete TUG <=15 seconds to demonstrate lesser fall risk & improved balance.     Baseline  11/05/18 17.69 sec; 12/04/18 13.1 sec    Time  4    Period  Weeks    Status  Achieved      PT SHORT TERM GOAL #3   Title  Patient will report spinning sensation will decr by 50% (intensity or duration) when she moves from supine to sitting at EOB.     Baseline  12/04/18 decr 50 %     Time  4    Period  Weeks    Status  Achieved        PT Long Term Goals - 12/04/18 1700      PT LONG TERM GOAL #1   Title  Patient will be independent with HEP to address balance and safety with walking (LTGs target 01/04/2019)    Time  8    Period  Weeks    Status  On-going      PT LONG TERM GOAL #2   Title  TUG <=13.5 seconds to indicate lesser fall risk.     Baseline  12/04/18 13.1 sec    Time  8    Period  Weeks    Status  Achieved      PT LONG TERM GOAL #3   Title  Patient will ascend/descend curb (using exterior, actual curb) with LRAD with supervision to allow incr independence in community ambulation    Time  8    Period  Weeks    Status  On-going            Plan - 12/18/18 1421    Clinical Impression Statement  Patient reports incorporating many of her exercises into her daily routine. She reports although she still has moments of spinning, she feels the best she has felt in  some time. She actually walked in without her cane today! Session focused on finalizing HEP  (added standing EC exercise) and curb training at outside curb. Discussed that her final visit is now scheduled with another PT due to myself being off that day. She is undecided if she wants to come and see another therapist.     Rehab Potential  Good    Clinical Impairments Affecting Rehab Potential  pt's obesity, limited cervical ROM , and pain in left sidelying due to left shoulder replacement make cannalith repositioning maneuvers difficult due to cannot achieve needed positioning    PT Frequency  Biweekly    PT Duration  8 weeks    PT Treatment/Interventions  ADLs/Self Care Home Management;Canalith Repostioning;DME Instruction;Gait training;Neuromuscular re-education;Balance training;Therapeutic exercise;Therapeutic activities;Functional mobility training;Patient/family education;Manual techniques;Dry needling;Passive range of motion;Vestibular;Visual/perceptual remediation/compensation    PT Next Visit Plan  final visit--check if any questions with HEP (we finalized last visit); work on curb (outside if weather permits) with hurrycane (this is final LTG she has not met); you can forward d/c to [employer] and Agree with Plan of Care  Patient       Patient will benefit from skilled therapeutic intervention in order to improve the following deficits and impairments:  Abnormal gait, Decreased activity tolerance, Decreased balance, Decreased mobility, Decreased knowledge of use of DME, Decreased knowledge of precautions, Decreased range of motion, Dizziness, Increased fascial restricitons, Impaired flexibility, Postural dysfunction, Obesity  Visit Diagnosis: Dizziness and giddiness  Unsteadiness on feet     Problem List Patient Active Problem List   Diagnosis Date Noted  . Exposure to the flu 12/15/2018  . ILD (interstitial lung disease) ([place]) 12/15/2018  . Enlarged heart 06/16/2018  . Ataxia 01/01/2018  . Morbid obesity ([place]) 10/17/2017  . Cough 12/31/2016  . Functional  belching disorder 12/31/2016  . Hot flashes, menopausal 12/31/2016  . DDD (degenerative disc disease), lumbar 11/06/2015  . Right-sided low back pain without sciatica 06/21/2015  . Diastolic dysfunction without heart failure 11/13/2014  . Other abnormal glucose 05/27/2013  . Osteopenia of the elderly 05/27/2013  . Hyperlipidemia LDL goal <130 09/04/2012  . Esophageal stricture 09/12/2011  . Visit for screening mammogram 08/08/2011  . Routine general medical examination at a health care facility 08/08/2011  . Essential hypertension, benign 01/14/2011  . URINARY INCONTINENCE 12/14/2008  . OA (osteoarthritis) 07/21/2008  . Obstructive sleep apnea 10/14/2007  . GERD 10/14/2007    Rexanne Mano, PT 12/18/2018, 2:27 PM  [facility] [address] [place], [place], [address] Phone: [phone]   Fax:  [phone]  Name: MARSHEILA ALEJO MRN: [id number] Date of Birth: 1941/02/02

## 2018-12-18 NOTE — Patient Instructions (Signed)
Feet Apart, Head Motion - Eyes Closed    With feet more than shoulder width apart, close your eyes and hold for 30 seconds. Repeat _3___ times per session. Do __1__ sessions per day.

## 2018-12-30 ENCOUNTER — Other Ambulatory Visit: Payer: Self-pay | Admitting: Pulmonary Disease

## 2019-01-01 ENCOUNTER — Ambulatory Visit: Payer: Medicare Other | Admitting: Physical Therapy

## 2019-01-14 DIAGNOSIS — H5212 Myopia, left eye: Secondary | ICD-10-CM | POA: Diagnosis not present

## 2019-01-14 DIAGNOSIS — H52222 Regular astigmatism, left eye: Secondary | ICD-10-CM | POA: Diagnosis not present

## 2019-01-14 DIAGNOSIS — H04123 Dry eye syndrome of bilateral lacrimal glands: Secondary | ICD-10-CM | POA: Diagnosis not present

## 2019-01-14 DIAGNOSIS — H52221 Regular astigmatism, right eye: Secondary | ICD-10-CM | POA: Diagnosis not present

## 2019-01-14 DIAGNOSIS — H524 Presbyopia: Secondary | ICD-10-CM | POA: Diagnosis not present

## 2019-01-14 DIAGNOSIS — H353131 Nonexudative age-related macular degeneration, bilateral, early dry stage: Secondary | ICD-10-CM | POA: Diagnosis not present

## 2019-01-14 DIAGNOSIS — Z961 Presence of intraocular lens: Secondary | ICD-10-CM | POA: Diagnosis not present

## 2019-01-15 ENCOUNTER — Encounter: Payer: Self-pay | Admitting: Physical Therapy

## 2019-01-15 NOTE — Therapy (Signed)
[facility] [address] [place], [place], [address] Phone: [phone]   Fax:  [phone]  Patient Details  Name: Veronica Garza MRN: [id number] Date of Birth: Jul 22, 1941 Referring Provider:  Marrian Salvage, FNP  Encounter Date: 01/15/2019  PHYSICAL THERAPY DISCHARGE SUMMARY  Visits from Start of Care: 9  Current functional level related to goals / functional outcomes: Unable to assess as pt did not return for final visit   Remaining deficits: Dizziness related to syncopal episodes/hypotension   Education / Equipment: HEP, reinforced MDs recommendations re: diet and managing possible syncope  Plan: Patient agrees to discharge.  Patient goals were partially met. Patient is being discharged due to not returning since the last visit.  ?????       Rexanne Mano , PT 01/15/2019, 10:03 AM  [facility] [address] [place], [place], [address] Phone: [phone]   Fax:  [phone]

## 2019-02-15 DIAGNOSIS — M25561 Pain in right knee: Secondary | ICD-10-CM | POA: Diagnosis not present

## 2019-03-01 DIAGNOSIS — H6123 Impacted cerumen, bilateral: Secondary | ICD-10-CM | POA: Diagnosis not present

## 2019-03-17 ENCOUNTER — Other Ambulatory Visit: Payer: Medicare Other

## 2019-03-22 ENCOUNTER — Ambulatory Visit: Payer: Medicare Other | Admitting: Pulmonary Disease

## 2019-05-10 ENCOUNTER — Other Ambulatory Visit: Payer: Self-pay

## 2019-05-10 ENCOUNTER — Ambulatory Visit (INDEPENDENT_AMBULATORY_CARE_PROVIDER_SITE_OTHER): Payer: Medicare Other | Admitting: Family

## 2019-05-10 ENCOUNTER — Encounter: Payer: Self-pay | Admitting: Family

## 2019-05-10 ENCOUNTER — Other Ambulatory Visit (INDEPENDENT_AMBULATORY_CARE_PROVIDER_SITE_OTHER): Payer: Medicare Other

## 2019-05-10 VITALS — BP 160/94 | HR 89 | Temp 97.8°F | Ht 59.0 in | Wt 214.0 lb

## 2019-05-10 DIAGNOSIS — I10 Essential (primary) hypertension: Secondary | ICD-10-CM | POA: Diagnosis not present

## 2019-05-10 LAB — COMPREHENSIVE METABOLIC PANEL
ALT: 19 U/L (ref 0–35)
AST: 22 U/L (ref 0–37)
Albumin: 4.1 g/dL (ref 3.5–5.2)
Alkaline Phosphatase: 119 U/L — ABNORMAL HIGH (ref 39–117)
BUN: 17 mg/dL (ref 6–23)
CO2: 24 mEq/L (ref 19–32)
Calcium: 9.1 mg/dL (ref 8.4–10.5)
Chloride: 103 mEq/L (ref 96–112)
Creatinine, Ser: 0.65 mg/dL (ref 0.40–1.20)
GFR: 88.49 mL/min (ref >60.00)
Glucose, Bld: 103 mg/dL — ABNORMAL HIGH (ref 70–99)
Potassium: 3.9 mEq/L (ref 3.5–5.1)
Sodium: 138 mEq/L (ref 135–145)
Total Bilirubin: 0.5 mg/dL (ref 0.2–1.2)
Total Protein: 6.8 g/dL (ref 6.0–8.3)

## 2019-05-10 LAB — CBC WITH DIFFERENTIAL/PLATELET
Basophils Absolute: 0.1 10*3/uL (ref 0.0–0.1)
Basophils Relative: 0.9 % (ref 0.0–3.0)
Eosinophils Absolute: 0.2 10*3/uL (ref 0.0–0.7)
Eosinophils Relative: 2.1 % (ref 0.0–5.0)
HCT: 37.7 % (ref 36.0–46.0)
Hemoglobin: 12.9 g/dL (ref 12.0–15.0)
Lymphocytes Relative: 28.2 % (ref 12.0–46.0)
Lymphs Abs: 2.1 10*3/uL (ref 0.7–4.0)
MCHC: 34.3 g/dL (ref 30.0–36.0)
MCV: 92.4 fl (ref 78.0–100.0)
Monocytes Absolute: 0.7 10*3/uL (ref 0.1–1.0)
Monocytes Relative: 9.3 % (ref 3.0–12.0)
Neutro Abs: 4.5 10*3/uL (ref 1.4–7.7)
Neutrophils Relative %: 59.5 % (ref 43.0–77.0)
Platelets: 193 10*3/uL (ref 150.0–400.0)
RBC: 4.08 Mil/uL (ref 3.87–5.11)
RDW: 14.7 % (ref 11.5–15.5)
WBC: 7.5 10*3/uL (ref 4.0–10.5)

## 2019-05-10 MED ORDER — AZILSARTAN MEDOXOMIL 40 MG PO TABS: ORAL_TABLET | ORAL | Status: DC

## 2019-05-10 NOTE — Progress Notes (Signed)
Veronica Garza is a 78 y.o. female with the following history as recorded in EpicCare:  Patient Active Problem List   Diagnosis Date Noted  . Exposure to the flu 12/15/2018  . ILD (interstitial lung disease) ([place]) 12/15/2018  . Enlarged heart 06/16/2018  . Ataxia 01/01/2018  . Morbid obesity ([place]) 10/17/2017  . Cough 12/31/2016  . 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 Hot flashes, menopausal 12/31/2016  . DDD (degenerative disc disease), lumbar 11/06/2015  . Right-sided low back pain without sciatica 06/21/2015  . Diastolic dysfunction without heart failure 11/13/2014  . Other abnormal glucose 05/27/2013  . Osteopenia of the elderly 05/27/2013  . Hyperlipidemia LDL goal <130 09/04/2012  . Esophageal stricture 09/12/2011  . Visit for screening mammogram 08/08/2011  . Routine general medical examination at a health care facility 08/08/2011  . Essential hypertension, benign 01/14/2011  . URINARY INCONTINENCE 12/14/2008  . OA (osteoarthritis) 07/21/2008  . Obstructive sleep apnea 10/14/2007  . GERD 10/14/2007    Current Outpatient Medications  Medication Sig Dispense Refill  . Calcium Carbonate-Vitamin D (CALTRATE 600+D) 600-400 MG-UNIT per tablet Take 1 tablet by mouth 2 (two) times daily.     . furosemide (LASIX) 20 MG tablet Take 1 tablet every other day 30 tablet 0  . ibuprofen (ADVIL,MOTRIN) 200 MG tablet Take 600 mg by mouth every 8 (eight) hours as needed.    . mirabegron ER (MYRBETRIQ) 50 MG TB24 tablet Take 50 mg by mouth daily as needed.     . Multiple Vitamins-Minerals (ICAPS AREDS 2 PO) Take by mouth.    Marland Kitchen omeprazole (PRILOSEC) 40 MG capsule Take 1 capsule (40 mg total) by mouth daily. 90 capsule 3  . PARoxetine (PAXIL) 10 MG tablet TAKE 1 TABLET DAILY 90 tablet 1  . Polyethyl Glycol-Propyl Glycol (SYSTANE OP) Apply 1 drop to eye as directed. Into both eyes four times a day for dry eyes    . TURMERIC PO Take 1 tablet by mouth daily. Take one by mouth daily     No  current facility-administered medications for this visit.     Allergies: Iohexol; Naproxen; Neomycin-bacitracin zn-polymyx; and Sulfonamide derivatives  Past Medical History:  Diagnosis Date  . Anemia   . Arthritis   . Cataract   . GERD (gastroesophageal reflux disease)   . Hx of shoulder replacement    lt  . Hypertension   . OSA (obstructive sleep apnea)   . Pneumonia    History of  . Urinary incontinence     Past Surgical History:  Procedure Laterality Date  . ABDOMINAL HYSTERECTOMY    . EYE SURGERY     bilateral cataract extraction  . NASAL SINUS SURGERY    . SHOULDER ARTHROSCOPY     Left  . TOTAL SHOULDER ARTHROPLASTY  10/10/2011   Procedure: TOTAL SHOULDER ARTHROPLASTY;  Surgeon: Metta Clines Supple;  Location: [facility];  Service: Orthopedics;  Laterality: Right;  . UPPER GASTROINTESTINAL ENDOSCOPY    . Vaginal Sling      Family History  Problem Relation Age of Onset  . Cancer Father        Prostate cancer  . Cancer Sister        Ovarian cancer  . Diabetes Sister   . Alzheimer's disease Sister   . Alzheimer's disease Mother   . COPD Neg Hx   . Alcohol abuse Neg Hx   . Early death Neg Hx   . Heart disease Neg Hx   . Hyperlipidemia Neg Hx   .  Hypertension Neg Hx   . Kidney disease Neg Hx   . Stroke Neg Hx     Social History   Tobacco Use  . Smoking status: Never Smoker  . Smokeless tobacco: Never Used  Substance Use Topics  . Alcohol use: No    Subjective:  Patient presents with concerns for elevated blood pressure; has been having increased blood pressure readings in the past few days; Denies any chest pain, shortness of breath, blurred vision. Increased dizziness in the past few days as well; no sensation of skipped beats, irregular heart beats; States blood pressure was up to 200/100 this morning.  In reviewing notes, patient has been diagnosed with hypertension in the past but no medications have been taken; has seen cardiology in the past  year;    Objective:  Vitals:   05/10/19 1435  BP: (!) 160/94  Pulse: 89  Temp: 97.8 F (36.6 C)  TempSrc: Oral  SpO2: 98%  Weight: 214 lb (97.1 kg)  Height: 4\' 11"  (1.499 m)    General: Well developed, well nourished, in no acute distress  Skin : Warm and dry.  Head: Normocephalic and atraumatic  Lungs: Respirations unlabored; clear to auscultation bilaterally without wheeze, rales, rhonchi  CVS exam: normal rate and regular rhythm.  Neurologic: Alert and oriented; speech intact; face symmetrical; moves all extremities well; CNII-XII intact without focal deficit   Assessment:  1. Hypertension, unspecified type     Plan:  Update EKG- no acute changes noted/ consistent with EKG seen in 2015; check CBC, CMP today; discussed medication- patient wants to use Edarbi 40 mg- she notes that she has a lot of these samples at her house; follow-up in 1 week, sooner prn.   No follow-ups on file.  Orders Placed This Encounter  Procedures  . EKG 12-Lead    Requested Prescriptions    No prescriptions requested or ordered in this encounter

## 2019-05-19 ENCOUNTER — Other Ambulatory Visit: Payer: Self-pay

## 2019-05-19 ENCOUNTER — Encounter: Payer: Self-pay | Admitting: Internal Medicine

## 2019-05-19 ENCOUNTER — Ambulatory Visit (INDEPENDENT_AMBULATORY_CARE_PROVIDER_SITE_OTHER): Payer: Medicare Other | Admitting: Internal Medicine

## 2019-05-19 ENCOUNTER — Other Ambulatory Visit: Payer: Medicare Other

## 2019-05-19 ENCOUNTER — Other Ambulatory Visit (INDEPENDENT_AMBULATORY_CARE_PROVIDER_SITE_OTHER): Payer: Medicare Other

## 2019-05-19 VITALS — BP 142/82 | HR 88 | Temp 98.3°F | Resp 16 | Ht 59.0 in | Wt 212.0 lb

## 2019-05-19 DIAGNOSIS — I10 Essential (primary) hypertension: Secondary | ICD-10-CM

## 2019-05-19 DIAGNOSIS — R7309 Other abnormal glucose: Secondary | ICD-10-CM

## 2019-05-19 DIAGNOSIS — Z23 Encounter for immunization: Secondary | ICD-10-CM | POA: Diagnosis not present

## 2019-05-19 DIAGNOSIS — E785 Hyperlipidemia, unspecified: Secondary | ICD-10-CM | POA: Diagnosis not present

## 2019-05-19 LAB — TSH: TSH: 1.74 u[IU]/mL (ref 0.35–4.50)

## 2019-05-19 LAB — LIPID PANEL
Cholesterol: 185 mg/dL (ref 0–200)
HDL: 60.3 mg/dL (ref >39.00)
LDL Cholesterol: 88 mg/dL (ref 0–99)
NonHDL: 124.95
Total CHOL/HDL Ratio: 3
Triglycerides: 187 mg/dL — ABNORMAL HIGH (ref 0.0–149.0)
VLDL: 37.4 mg/dL (ref 0.0–40.0)

## 2019-05-19 LAB — HEMOGLOBIN A1C: Hgb A1c MFr Bld: 5.8 % (ref 4.6–6.5)

## 2019-05-19 MED ORDER — IRBESARTAN 300 MG PO TABS: 300.0000 mg | ORAL_TABLET | Freq: Every day | ORAL | 1 refills | Status: DC

## 2019-05-19 MED ORDER — ROSUVASTATIN CALCIUM 20 MG PO TABS: 20.0000 mg | ORAL_TABLET | Freq: Every day | ORAL | 1 refills | Status: DC

## 2019-05-19 MED ORDER — EDARBI 40 MG PO TABS: ORAL_TABLET | ORAL | 1 refills | Status: DC

## 2019-05-19 NOTE — Patient Instructions (Signed)

## 2019-05-19 NOTE — Progress Notes (Signed)
Subjective:  Patient ID: Veronica Garza, female    DOB: [birth year]  Age: 78 y.o. MRN: [id number]  CC: Hypertension and Hyperlipidemia   HPI Veronica Garza presents for f/up - She was recently seen by another provider and was diagnosed with hypertension.  She has been taking Edarbi samples to control her blood pressure.  She is no longer taking the loop diuretic.  She denies any recent episodes of headache, blurred vision, CP, DOE, palpitations, edema, or fatigue.  Outpatient Medications Prior to Visit  Medication Sig Dispense Refill   Calcium Carbonate-Vitamin D (CALTRATE 600+D) 600-400 MG-UNIT per tablet Take 1 tablet by mouth 2 (two) times daily.      mirabegron ER (MYRBETRIQ) 50 MG TB24 tablet Take 50 mg by mouth daily as needed.      Multiple Vitamins-Minerals (ICAPS AREDS 2 PO) Take by mouth.     omeprazole (PRILOSEC) 40 MG capsule Take 1 capsule (40 mg total) by mouth daily. 90 capsule 3   PARoxetine (PAXIL) 10 MG tablet TAKE 1 TABLET DAILY 90 tablet 1   TURMERIC PO Take 1 tablet by mouth daily. Take one by mouth daily     Azilsartan Medoxomil (EDARBI) 40 MG TABS Take 1 tablet per day 30 tablet    ibuprofen (ADVIL,MOTRIN) 200 MG tablet Take 600 mg by mouth every 8 (eight) hours as needed.     Polyethyl Glycol-Propyl Glycol (SYSTANE OP) Apply 1 drop to eye as directed. Into both eyes four times a day for dry eyes     furosemide (LASIX) 20 MG tablet Take 1 tablet every other day 30 tablet 0   No facility-administered medications prior to visit.     ROS Review of Systems  Constitutional: Negative.  Negative for diaphoresis and fatigue.  HENT: Negative.   Eyes: Negative for visual disturbance.  Respiratory: Negative for cough, chest tightness, shortness of breath and wheezing.   Cardiovascular: Negative for chest pain, palpitations and leg swelling.  Gastrointestinal: Negative for abdominal pain, diarrhea, nausea and vomiting.  Endocrine: Negative.   Genitourinary:  Negative.  Negative for difficulty urinating.  Musculoskeletal: Negative.  Negative for myalgias.  Skin: Negative.   Neurological: Negative.  Negative for dizziness, weakness, light-headedness and numbness.  Hematological: Negative for adenopathy. Does not bruise/bleed easily.  Psychiatric/Behavioral: Negative.     Objective:  BP (!) 142/82 (BP Location: Left Arm, Patient Position: Sitting, Cuff Size: Large)    Pulse 88    Temp 98.3 F (36.8 C) (Oral)    Resp 16    Ht 4\' 11"  (1.499 m)    Wt 212 lb (96.2 kg)    SpO2 97%    BMI 42.82 kg/m   BP Readings from Last 3 Encounters:  05/19/19 (!) 142/82  05/10/19 (!) 160/94  12/15/18 138/88    Wt Readings from Last 3 Encounters:  05/19/19 212 lb (96.2 kg)  05/10/19 214 lb (97.1 kg)  12/15/18 211 lb 3.2 oz (95.8 kg)    Physical Exam Constitutional:      Appearance: She is obese. She is not ill-appearing or diaphoretic.  HENT:     Nose: Nose normal.     Mouth/Throat:     Mouth: Mucous membranes are moist.     Pharynx: No posterior oropharyngeal erythema.  Eyes:     General: No scleral icterus.    Conjunctiva/sclera: Conjunctivae normal.  Neck:     Musculoskeletal: Normal range of motion. No neck rigidity or muscular tenderness.  Cardiovascular:     Rate  and Rhythm: Normal rate and regular rhythm.     Heart sounds: No murmur.  Pulmonary:     Effort: Pulmonary effort is normal.     Breath sounds: No stridor. No wheezing, rhonchi or rales.  Abdominal:     General: Abdomen is protuberant. Bowel sounds are normal.     Palpations: There is no hepatomegaly or splenomegaly.     Tenderness: There is no abdominal tenderness.  Musculoskeletal: Normal range of motion.        General: No swelling.     Right lower leg: No edema.     Left lower leg: No edema.  Lymphadenopathy:     Cervical: No cervical adenopathy.  Skin:    General: Skin is warm and dry.  Neurological:     General: No focal deficit present.  Psychiatric:        Mood  and Affect: Mood normal.        Behavior: Behavior normal.     Lab Results  Component Value Date   WBC 7.5 05/10/2019   HGB 12.9 05/10/2019   HCT 37.7 05/10/2019   PLT 193.0 05/10/2019   GLUCOSE 103 (H) 05/10/2019   CHOL 185 05/19/2019   TRIG 187.0 (H) 05/19/2019   HDL 60.30 05/19/2019   LDLDIRECT 119.5 09/04/2012   LDLCALC 88 05/19/2019   ALT 19 05/10/2019   AST 22 05/10/2019   NA 138 05/10/2019   K 3.9 05/10/2019   CL 103 05/10/2019   CREATININE 0.65 05/10/2019   BUN 17 05/10/2019   CO2 24 05/10/2019   TSH 1.74 05/19/2019   INR 0.93 09/30/2011   HGBA1C 5.8 05/19/2019    Ct Chest High Resolution  Result Date: 12/07/2018 CLINICAL DATA:  Shortness of breath, interstitial lung disease. EXAM: CT CHEST WITHOUT CONTRAST TECHNIQUE: Multidetector CT imaging of the chest was performed following the standard protocol without intravenous contrast. High resolution imaging of the lungs, as well as inspiratory and expiratory imaging, was performed. COMPARISON:  None. FINDINGS: Cardiovascular: Atherosclerotic calcification of the aorta. Heart is enlarged. No pericardial effusion. Mediastinum/Nodes: Mediastinal and axillary lymph nodes are not enlarged by CT size criteria. Hilar regions are difficult to definitively evaluate without IV contrast. Esophagus is grossly unremarkable. Lungs/Pleura: Image quality is degraded by expiratory phase imaging and respiratory motion making assessment for interstitial lung disease difficult. There may be some lateral subpleural reticulation in the mid and lower hemi thoraces. A few scattered pulmonary nodules measure up to 4 mm in the left lower lobe (series 3, image 44). No pleural fluid. Airway is grossly unremarkable. Assessment for air trapping is limited by lack of true expiratory imaging. Upper Abdomen: Visualized portions of the liver, right adrenal gland, spleen and stomach are grossly unremarkable. Musculoskeletal: Degenerative changes in the spine. Right  shoulder arthroplasty. No worrisome lytic or sclerotic lesions. IMPRESSION: 1. Assessment for interstitial lung disease is markedly limited by expiratory phase imaging and respiratory motion. Possible mild nonspecific subpleural reticulation in the lateral aspects of the mid and lower lung zones. Consider follow-up high-resolution chest CT when the patient is less short of breath and can follow breathing instructions. 2. Pulmonary nodules measure 4 mm or less in size. No follow-up needed if patient is low-risk (and has no known or suspected primary neoplasm). Non-contrast chest CT can be considered in 12 months if patient is high-risk. This recommendation follows the consensus statement: Guidelines for Management of Incidental Pulmonary Nodules Detected on CT Images: From the Fleischner Society 2017; Radiology 2017; 284:228-243. 3.  Aortic atherosclerosis (ICD10-170.0). Electronically Signed   By: Lorin Picket M.D.   On: 12/07/2018 14:53    Assessment & Plan:   Deleah was seen today for hypertension and hyperlipidemia.  Diagnoses and all orders for this visit:  Essential hypertension, benign- Her blood pressure is not quite adequately well controlled.  Her insurance will not pay for Edarbi.  I have asked her to switch to irbesartan.  Also, she agrees to improve her lifestyle modifications to lower her blood pressure. -     Discontinue: Azilsartan Medoxomil (EDARBI) 40 MG TABS; Take 1 tablet per day -     TSH; Future -     irbesartan (AVAPRO) 300 MG tablet; Take 1 tablet (300 mg total) by mouth daily.  Hyperlipidemia LDL goal <130- She has an elevated ASCVD risk score so I have asked her to start taking a statin for CV risk reduction. -     Lipid panel; Future -     TSH; Future -     rosuvastatin (CRESTOR) 20 MG tablet; Take 1 tablet (20 mg total) by mouth daily.  Morbid obesity ([place])- She agrees to improve her lifestyle modifications. -     Hemoglobin A1c; Future  Other abnormal glucose-  She is prediabetic.  Medical therapy is not indicated. -     Hemoglobin A1c; Future  Need for pneumococcal vaccination -     Pneumococcal polysaccharide vaccine 23-valent greater than or equal to 2yo subcutaneous/IM   I have discontinued Areli B. Rudden's Polyethyl Glycol-Propyl Glycol (SYSTANE OP), ibuprofen, furosemide, Azilsartan Medoxomil, and Edarbi. I am also having her start on irbesartan and rosuvastatin. Additionally, I am having her maintain her Calcium Carbonate-Vitamin D, TURMERIC PO, mirabegron ER, Multiple Vitamins-Minerals (ICAPS AREDS 2 PO), omeprazole, and PARoxetine.  Meds ordered this encounter  Medications   DISCONTD: Azilsartan Medoxomil (EDARBI) 40 MG TABS    Sig: Take 1 tablet per day    Dispense:  90 tablet    Refill:  1   irbesartan (AVAPRO) 300 MG tablet    Sig: Take 1 tablet (300 mg total) by mouth daily.    Dispense:  90 tablet    Refill:  1   rosuvastatin (CRESTOR) 20 MG tablet    Sig: Take 1 tablet (20 mg total) by mouth daily.    Dispense:  90 tablet    Refill:  1     Follow-up: Return in about 6 months (around 11/18/2019).  Scarlette Calico, MD

## 2019-05-20 ENCOUNTER — Inpatient Hospital Stay: Admission: RE | Admit: 2019-05-20 | Payer: Medicare Other | Source: Ambulatory Visit

## 2019-05-20 NOTE — Telephone Encounter (Signed)
Pt informed of lab results. 

## 2019-05-24 ENCOUNTER — Ambulatory Visit: Payer: Medicare Other

## 2019-05-27 ENCOUNTER — Other Ambulatory Visit: Payer: Self-pay | Admitting: Gastroenterology

## 2019-05-27 ENCOUNTER — Other Ambulatory Visit: Payer: Self-pay | Admitting: Internal Medicine

## 2019-06-18 DIAGNOSIS — M1711 Unilateral primary osteoarthritis, right knee: Secondary | ICD-10-CM | POA: Diagnosis not present

## 2019-06-18 DIAGNOSIS — M25561 Pain in right knee: Secondary | ICD-10-CM | POA: Diagnosis not present

## 2019-06-22 ENCOUNTER — Telehealth: Payer: Self-pay | Admitting: *Deleted

## 2019-06-22 ENCOUNTER — Ambulatory Visit (INDEPENDENT_AMBULATORY_CARE_PROVIDER_SITE_OTHER): Payer: Medicare Other | Admitting: *Deleted

## 2019-06-22 ENCOUNTER — Other Ambulatory Visit: Payer: Self-pay | Admitting: Internal Medicine

## 2019-06-22 DIAGNOSIS — Z780 Asymptomatic menopausal state: Secondary | ICD-10-CM | POA: Diagnosis not present

## 2019-06-22 DIAGNOSIS — N3281 Overactive bladder: Secondary | ICD-10-CM | POA: Insufficient documentation

## 2019-06-22 DIAGNOSIS — Z0000 Encounter for general adult medical examination without abnormal findings: Secondary | ICD-10-CM | POA: Diagnosis not present

## 2019-06-22 MED ORDER — TOLTERODINE TARTRATE ER 2 MG PO CP24: 2.0000 mg | ORAL_CAPSULE | Freq: Every day | ORAL | 1 refills | Status: DC

## 2019-06-22 NOTE — Telephone Encounter (Signed)
During AWV, patient stated she is unable to afford myrbetriq for overactive bladder. She wants to know if a more affordable medication could be prescribed.

## 2019-06-22 NOTE — Telephone Encounter (Signed)
During AWV, patient stated that she continues to have issues with overactive bladder. She is unable to afford

## 2019-06-22 NOTE — Progress Notes (Addendum)
Subjective:   Veronica Garza is a 78 y.o. female who presents for Medicare Annual (Subsequent) preventive examination. I connected with patient by a telephone and verified that I am speaking with the correct person using two identifiers. Patient stated full name and DOB. Patient gave permission to continue with telephonic visit. Patient's location was at home and Nurse's location was at [place] office.   Review of Systems:   Cardiac Risk Factors include: advanced age (>32men, >15 women);dyslipidemia;hypertension;obesity (BMI >30kg/m2) Sleep patterns: gets up 2-3 times nightly to void and sleeps 6 hours nightly.    Home Safety/Smoke Alarms: Feels safe in home. Smoke alarms in place.  Living environment; residence and Firearm Safety: 1-story house/ trailer, equipment: Radio producer, Type: [place]. Lives with husband, no needs for DME, good support system Seat Belt Safety/Bike Helmet: Wears seat belt.     Objective:     Vitals: There were no vitals taken for this visit.  There is no height or weight on file to calculate BMI.  Advanced Directives 06/22/2019 09/22/2018 01/01/2018 01/07/2017 03/27/2016 02/20/2016 01/05/2016  Does Patient Have a Medical Advance Directive? Yes Yes Yes Yes Yes No No  Type of Paramedic of [place];Living will [place];Living will [place];Living will [place];Living will [place] in Chart? No - copy requested - No - copy requested - - - -  Would patient like information on creating a medical advance directive? - - - - - - -  Pre-existing out of facility DNR order (yellow form or pink MOST form) - - - - - - -    Tobacco Social History   Tobacco Use  Smoking Status Never Smoker  Smokeless Tobacco Never Used     Counseling given: Not Answered  Past Medical History:  Diagnosis Date  . Anemia   . Arthritis   .  Cataract   . GERD (gastroesophageal reflux disease)   . Hx of shoulder replacement    lt  . Hypertension   . OSA (obstructive sleep apnea)   . Pneumonia    History of  . Urinary incontinence    Past Surgical History:  Procedure Laterality Date  . ABDOMINAL HYSTERECTOMY    . EYE SURGERY     bilateral cataract extraction  . NASAL SINUS SURGERY    . SHOULDER ARTHROSCOPY     Left  . TOTAL SHOULDER ARTHROPLASTY  10/10/2011   Procedure: TOTAL SHOULDER ARTHROPLASTY;  Surgeon: Metta Clines Supple;  Location: [facility];  Service: Orthopedics;  Laterality: Right;  . UPPER GASTROINTESTINAL ENDOSCOPY    . Vaginal Sling     Family History  Problem Relation Age of Onset  . Cancer Father        Prostate cancer  . Cancer Sister        Ovarian cancer  . Diabetes Sister   . Alzheimer's disease Sister   . Alzheimer's disease Mother   . COPD Neg Hx   . Alcohol abuse Neg Hx   . Early death Neg Hx   . Heart disease Neg Hx   . Hyperlipidemia Neg Hx   . Hypertension Neg Hx   . Kidney disease Neg Hx   . Stroke Neg Hx    Social History   Socioeconomic History  . Marital status: Married    Spouse name: Not on file  . Number of children: 1  . Years of  education: Not on file  . Highest education level: Not on file  Occupational History  . Occupation: Retired  Scientific laboratory technician  . Financial resource strain: Not hard at all  . Food insecurity    Worry: Never true    Inability: Never true  . Transportation needs    Medical: No    Non-medical: No  Tobacco Use  . Smoking status: Never Smoker  . Smokeless tobacco: Never Used  Substance and Sexual Activity  . Alcohol use: No  . Drug use: No  . Sexual activity: Not Currently  Lifestyle  . Physical activity    Days per week: 0 days    Minutes per session: 0 min  . Stress: Not at all  Relationships  . Social connections    Talks on phone: More than three times a week    Gets together: More than three times a week    Attends religious service: 1  to 4 times per year    Active member of club or organization: Yes    Attends meetings of clubs or organizations: More than 4 times per year    Relationship status: Married  Other Topics Concern  . Not on file  Social History Narrative   Regular exercise-Yes   Retired    Married-77 boy    Outpatient Encounter Medications as of 06/22/2019  Medication Sig  . Calcium Carbonate-Vitamin D (CALTRATE 600+D) 600-400 MG-UNIT per tablet Take 1 tablet by mouth 2 (two) times daily.   . irbesartan (AVAPRO) 300 MG tablet Take 1 tablet (300 mg total) by mouth daily.  . meloxicam (MOBIC) 15 MG tablet meloxicam 15 mg tablet  Take 1 tablet every day by oral route with meals.  . Multiple Vitamins-Minerals (ICAPS AREDS 2 PO) Take by mouth.  Marland Kitchen omeprazole (PRILOSEC) 40 MG capsule TAKE (1) CAPSULE DAILY  . PARoxetine (PAXIL) 10 MG tablet TAKE 1 TABLET DAILY  . rosuvastatin (CRESTOR) 20 MG tablet Take 1 tablet (20 mg total) by mouth daily.  . TURMERIC PO Take 1 tablet by mouth daily. Take one by mouth daily  . [DISCONTINUED] mirabegron ER (MYRBETRIQ) 50 MG TB24 tablet Take 50 mg by mouth daily as needed.    No facility-administered encounter medications on file as of 06/22/2019.     Activities of Daily Living In your present state of health, do you have any difficulty performing the following activities: 06/22/2019  Hearing? N  Vision? N  Difficulty concentrating or making decisions? N  Walking or climbing stairs? N  Dressing or bathing? N  Doing errands, shopping? N  Preparing Food and eating ? N  Using the Toilet? N  In the past six months, have you accidently leaked urine? N  Do you have problems with loss of bowel control? N  Managing your Medications? N  Managing your Finances? N  Housekeeping or managing your Housekeeping? N  Some recent data might be hidden    Patient Care Team: Janith Lima, MD as PCP - General Rigoberto Noel, MD as Consulting Physician (Pulmonary Disease) Mauri Pole, MD as Consulting Physician (Gastroenterology)    Assessment:   This is a routine wellness examination for [facility]. Physical assessment deferred to PCP.   Exercise Activities and Dietary recommendations Current Exercise Habits: The patient does not participate in regular exercise at present, Exercise limited by: orthopedic condition(s)  Diet (meal preparation, eat out, water intake, caffeinated beverages, dairy products, fruits and vegetables): in general, a "healthy" diet  , well balanced  Reviewed heart healthy diet. Encouraged patient to increase daily water and healthy fluid intake.  Goals    . Patient Stated     Increase my physical activity as tolerated, start doing chair exercises. Enjoy life and family.       Fall Risk Fall Risk  06/22/2019 01/01/2018 01/07/2017 05/27/2013  Falls in the past year? 0 No No No  Number falls in past yr: 0 - - -  Risk for fall due to : Impaired vision;Impaired balance/gait;Impaired mobility Impaired mobility;Impaired balance/gait - -  Risk for fall due to: Comment - discussed fall safety - -  Follow up Falls prevention discussed;Education provided - - -    Depression Screen PHQ 2/9 Scores 06/22/2019 05/19/2019 01/01/2018 01/07/2017  PHQ - 2 Score 0 0 0 0  PHQ- 9 Score - - 2 -     Cognitive Function MMSE - Mini Mental State Exam 01/01/2018  Orientation to time 5  Orientation to Place 5  Registration 3  Attention/ Calculation 5  Recall 2  Language- name 2 objects 2  Language- repeat 1  Language- follow 3 step command 3  Language- read & follow direction 1  Write a sentence 1  Copy design 1  Total score 29       Ad8 score reviewed for issues:  Issues making decisions: no  Less interest in hobbies / activities: no  Repeats questions, stories (family complaining): no  Trouble using ordinary gadgets (microwave, computer, phone):no  Forgets the month or year: no  Mismanaging finances: no  Remembering appts: no  Daily  problems with thinking and/or memory: no Ad8 score is= 0  Immunization History  Administered Date(s) Administered  . Influenza Split 09/04/2012  . Influenza Whole 08/30/2009, 09/18/2010, 08/08/2011  . Influenza, High Dose Seasonal PF 08/30/2014, 09/02/2016, 09/11/2017, 09/22/2018  . Influenza,inj,Quad PF,6+ Mos 09/02/2013, 09/01/2015  . Pneumococcal Conjugate-13 01/01/2018  . Pneumococcal Polysaccharide-23 05/19/2019  . Td 04/06/2010  . Zoster 09/04/2012   Screening Tests Health Maintenance  Topic Date Due  . INFLUENZA VACCINE  07/03/2019  . TETANUS/TDAP  04/06/2020  . DEXA SCAN  Completed  . PNA vac Low Risk Adult  Completed      Plan:     Reviewed health maintenance screenings with patient today and relevant education, vaccines, and/or referrals were provided.   Continue to eat heart healthy diet (full of fruits, vegetables, whole grains, lean protein, water--limit salt, fat, and sugar intake) and increase physical activity as tolerated.  Continue doing brain stimulating activities (puzzles, reading, adult coloring books, staying active) to keep memory sharp.   I have personally reviewed and noted the following in the patient's chart:   . Medical and social history . Use of alcohol, tobacco or illicit drugs  . Current medications and supplements . Functional ability and status . Nutritional status . Physical activity . Advanced directives . List of other physicians . Screenings to include cognitive, depression, and falls . Referrals and appointments  In addition, I have reviewed and discussed with patient certain preventive protocols, quality metrics, and best practice recommendations. A written personalized care plan for preventive services as well as general preventive health recommendations were provided to patient.     Michiel Cowboy, RN  06/22/2019   Medical screening examination/treatment/procedure(s) were performed by non-physician practitioner and as  supervising physician I was immediately available for consultation/collaboration. I agree with above. Scarlette Calico, MD

## 2019-06-22 NOTE — Addendum Note (Signed)
Addended by: Emelia Loron A on: 06/22/2019 02:57 PM   Modules accepted: Orders, SmartSet

## 2019-06-23 ENCOUNTER — Encounter: Payer: Self-pay | Admitting: *Deleted

## 2019-06-23 ENCOUNTER — Other Ambulatory Visit: Payer: Self-pay | Admitting: Internal Medicine

## 2019-06-23 DIAGNOSIS — I10 Essential (primary) hypertension: Secondary | ICD-10-CM

## 2019-06-23 MED ORDER — IRBESARTAN 300 MG PO TABS: 300.0000 mg | ORAL_TABLET | Freq: Every day | ORAL | 1 refills | Status: DC

## 2019-06-23 NOTE — Telephone Encounter (Signed)
Call to inform patient that a new prescription has been sent to her pharmacy. She verbalized understanding.

## 2019-07-19 ENCOUNTER — Ambulatory Visit (INDEPENDENT_AMBULATORY_CARE_PROVIDER_SITE_OTHER): Payer: Medicare Other | Admitting: Internal Medicine

## 2019-07-19 ENCOUNTER — Encounter: Payer: Self-pay | Admitting: Internal Medicine

## 2019-07-19 ENCOUNTER — Other Ambulatory Visit: Payer: Self-pay

## 2019-07-19 VITALS — BP 134/76 | HR 79 | Temp 98.7°F | Resp 15 | Ht 59.0 in | Wt 209.0 lb

## 2019-07-19 DIAGNOSIS — Z23 Encounter for immunization: Secondary | ICD-10-CM

## 2019-07-19 DIAGNOSIS — I10 Essential (primary) hypertension: Secondary | ICD-10-CM

## 2019-07-19 DIAGNOSIS — M25561 Pain in right knee: Secondary | ICD-10-CM | POA: Diagnosis not present

## 2019-07-19 NOTE — Progress Notes (Signed)
Subjective:  Patient ID: Veronica Garza, female    DOB: 23-Apr-1941  Age: 78 y.o. MRN: [id number]  CC: Hypertension   HPI Veronica Garza presents for f/up - She comes in for surgical clearance.  She is having total knee replacement in the next few months.  She tells me her blood pressure has been well controlled.  She denies any recent episodes of CP or DOE.  Outpatient Medications Prior to Visit  Medication Sig Dispense Refill   Calcium Carbonate-Vitamin D (CALTRATE 600+D) 600-400 MG-UNIT per tablet Take 1 tablet by mouth 2 (two) times daily.      irbesartan (AVAPRO) 300 MG tablet Take 1 tablet (300 mg total) by mouth daily. 90 tablet 1   Multiple Vitamins-Minerals (ICAPS AREDS 2 PO) Take by mouth.     omeprazole (PRILOSEC) 40 MG capsule TAKE (1) CAPSULE DAILY 90 capsule 0   PARoxetine (PAXIL) 10 MG tablet TAKE 1 TABLET DAILY 90 tablet 1   rosuvastatin (CRESTOR) 20 MG tablet Take 1 tablet (20 mg total) by mouth daily. 90 tablet 1   tolterodine (DETROL LA) 2 MG 24 hr capsule Take 1 capsule (2 mg total) by mouth daily. 90 capsule 1   TURMERIC PO Take 1 tablet by mouth daily. Take one by mouth daily     meloxicam (MOBIC) 15 MG tablet meloxicam 15 mg tablet  Take 1 tablet every day by oral route with meals.     No facility-administered medications prior to visit.     ROS Review of Systems  Constitutional: Negative.  Negative for diaphoresis, fatigue and unexpected weight change.  HENT: Negative.   Eyes: Negative for visual disturbance.  Respiratory: Negative for cough, chest tightness, shortness of breath and wheezing.   Cardiovascular: Negative for chest pain, palpitations and leg swelling.  Gastrointestinal: Negative for abdominal pain, constipation, nausea and vomiting.  Endocrine: Negative.   Genitourinary: Negative.  Negative for difficulty urinating.  Musculoskeletal: Positive for arthralgias. Negative for back pain and myalgias.  Skin: Negative for color change,  pallor and rash.  Neurological: Negative for dizziness, weakness and light-headedness.  Hematological: Negative.  Negative for adenopathy. Does not bruise/bleed easily.  Psychiatric/Behavioral: Negative.     Objective:  BP 134/76    Pulse 79    Temp 98.7 F (37.1 C) (Temporal)    Resp 15    Ht 4\' 11"  (1.499 m)    Wt 209 lb (94.8 kg)    SpO2 96%    BMI 42.21 kg/m   BP Readings from Last 3 Encounters:  07/19/19 134/76  05/19/19 (!) 142/82  05/10/19 (!) 160/94    Wt Readings from Last 3 Encounters:  07/19/19 209 lb (94.8 kg)  05/19/19 212 lb (96.2 kg)  05/10/19 214 lb (97.1 kg)    Physical Exam Vitals signs reviewed.  Constitutional:      Appearance: She is obese. She is not ill-appearing.  HENT:     Nose: Nose normal.     Mouth/Throat:     Mouth: Mucous membranes are moist.  Eyes:     General: No scleral icterus.    Conjunctiva/sclera: Conjunctivae normal.  Neck:     Musculoskeletal: Normal range of motion. No neck rigidity or muscular tenderness.  Cardiovascular:     Rate and Rhythm: Normal rate and regular rhythm.     Heart sounds: No murmur. No gallop.   Pulmonary:     Effort: Pulmonary effort is normal.     Breath sounds: No stridor. No wheezing, rhonchi  or rales.  Abdominal:     General: Abdomen is protuberant. Bowel sounds are normal.     Palpations: Abdomen is soft. There is no hepatomegaly, splenomegaly or mass.     Tenderness: There is no abdominal tenderness.  Musculoskeletal: Normal range of motion.     Right lower leg: No edema.     Left lower leg: No edema.  Lymphadenopathy:     Cervical: No cervical adenopathy.  Skin:    General: Skin is warm and dry.  Neurological:     General: No focal deficit present.     Mental Status: She is alert.  Psychiatric:        Mood and Affect: Mood normal.        Behavior: Behavior normal.     Lab Results  Component Value Date   WBC 7.5 05/10/2019   HGB 12.9 05/10/2019   HCT 37.7 05/10/2019   PLT 193.0  05/10/2019   GLUCOSE 103 (H) 05/10/2019   CHOL 185 05/19/2019   TRIG 187.0 (H) 05/19/2019   HDL 60.30 05/19/2019   LDLDIRECT 119.5 09/04/2012   LDLCALC 88 05/19/2019   ALT 19 05/10/2019   AST 22 05/10/2019   NA 138 05/10/2019   K 3.9 05/10/2019   CL 103 05/10/2019   CREATININE 0.65 05/10/2019   BUN 17 05/10/2019   CO2 24 05/10/2019   TSH 1.74 05/19/2019   INR 0.93 09/30/2011   HGBA1C 5.8 05/19/2019    Ct Chest High Resolution  Result Date: 12/07/2018 CLINICAL DATA:  Shortness of breath, interstitial lung disease. EXAM: CT CHEST WITHOUT CONTRAST TECHNIQUE: Multidetector CT imaging of the chest was performed following the standard protocol without intravenous contrast. High resolution imaging of the lungs, as well as inspiratory and expiratory imaging, was performed. COMPARISON:  None. FINDINGS: Cardiovascular: Atherosclerotic calcification of the aorta. Heart is enlarged. No pericardial effusion. Mediastinum/Nodes: Mediastinal and axillary lymph nodes are not enlarged by CT size criteria. Hilar regions are difficult to definitively evaluate without IV contrast. Esophagus is grossly unremarkable. Lungs/Pleura: Image quality is degraded by expiratory phase imaging and respiratory motion making assessment for interstitial lung disease difficult. There may be some lateral subpleural reticulation in the mid and lower hemi thoraces. A few scattered pulmonary nodules measure up to 4 mm in the left lower lobe (series 3, image 44). No pleural fluid. Airway is grossly unremarkable. Assessment for air trapping is limited by lack of true expiratory imaging. Upper Abdomen: Visualized portions of the liver, right adrenal gland, spleen and stomach are grossly unremarkable. Musculoskeletal: Degenerative changes in the spine. Right shoulder arthroplasty. No worrisome lytic or sclerotic lesions. IMPRESSION: 1. Assessment for interstitial lung disease is markedly limited by expiratory phase imaging and respiratory  motion. Possible mild nonspecific subpleural reticulation in the lateral aspects of the mid and lower lung zones. Consider follow-up high-resolution chest CT when the patient is less short of breath and can follow breathing instructions. 2. Pulmonary nodules measure 4 mm or less in size. No follow-up needed if patient is low-risk (and has no known or suspected primary neoplasm). Non-contrast chest CT can be considered in 12 months if patient is high-risk. This recommendation follows the consensus statement: Guidelines for Management of Incidental Pulmonary Nodules Detected on CT Images: From the Fleischner Society 2017; Radiology 2017; 284:228-243. 3.  Aortic atherosclerosis (ICD10-170.0). Electronically Signed   By: Lorin Picket M.D.   On: 12/07/2018 14:53    Assessment & Plan:   Veronica Garza was seen today for hypertension.  Diagnoses and  all orders for this visit:  Need for Tdap vaccination -     Tdap vaccine greater than or equal to 7yo IM  Essential hypertension, benign- Her blood pressure is well controlled.  Will continue on the current regimen.  She has a 0.2% MICA risk so I have recommended that she proceed with the total knee replacement surgery.   I am having Veronica Garza maintain her Calcium Carbonate-Vitamin D, TURMERIC PO, Multiple Vitamins-Minerals (ICAPS AREDS 2 PO), rosuvastatin, PARoxetine, omeprazole, meloxicam, tolterodine, and irbesartan.  No orders of the defined types were placed in this encounter.    Follow-up: Return in about 6 months (around 01/19/2020).  Scarlette Calico, MD

## 2019-07-19 NOTE — Patient Instructions (Signed)

## 2019-07-31 ENCOUNTER — Other Ambulatory Visit: Payer: Self-pay | Admitting: Internal Medicine

## 2019-07-31 DIAGNOSIS — I10 Essential (primary) hypertension: Secondary | ICD-10-CM

## 2019-08-10 NOTE — H&P (Signed)
TOTAL KNEE ADMISSION H&P  Patient is being admitted for right total knee arthroplasty.  Subjective:  Chief Complaint:    Right knee primary OA / pain  HPI: Veronica Garza, 78 y.o. female, has a history of pain and functional disability in the right knee due to arthritis and has failed non-surgical conservative treatments for greater than 12 weeks to includeNSAID's and/or analgesics and activity modification.  Onset of symptoms was gradual, starting <1 years ago with rapidlly worsening course since that time. The patient noted no past surgery on the right knee(s).  Patient currently rates pain in the right knee(s) at 6 out of 10 with activity. Patient has night pain, worsening of pain with activity and weight bearing, pain that interferes with activities of daily living, pain with passive range of motion, crepitus and joint swelling.  Patient has evidence of periarticular osteophytes and joint space narrowing by imaging studies.  There is no active infection.  Risks, benefits and expectations were discussed with the patient.  Risks including but not limited to the risk of anesthesia, blood clots, nerve damage, blood vessel damage, failure of the prosthesis, infection and up to and including death.  Patient understand the risks, benefits and expectations and wishes to proceed with surgery.   PCP: Janith Lima, MD  D/C Plans:       Home   Post-op Meds:       No Rx given   Tranexamic Acid:      To be given - IV   Decadron:      Is to be given  FYI:      ASA  [place]co  DME:   Pt equipment arranged  PT:   OPPT arranged  [facility]    Patient Active Problem List   Diagnosis Date Noted  . OAB (overactive bladder) 06/22/2019  . ILD (interstitial lung disease) ([place]) 12/15/2018  . Enlarged heart 06/16/2018  . Morbid obesity ([place]) 10/17/2017  . 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 Hot flashes, menopausal 12/31/2016  . DDD (degenerative disc disease), lumbar  11/06/2015  . Right-sided low back pain without sciatica 06/21/2015  . Diastolic dysfunction without heart failure 11/13/2014  . Other abnormal glucose 05/27/2013  . Osteopenia of the elderly 05/27/2013  . Hyperlipidemia LDL goal <130 09/04/2012  . Esophageal stricture 09/12/2011  . Visit for screening mammogram 08/08/2011  . Routine general medical examination at a health care facility 08/08/2011  . Essential hypertension, benign 01/14/2011  . URINARY INCONTINENCE 12/14/2008  . OA (osteoarthritis) 07/21/2008  . Obstructive sleep apnea 10/14/2007  . GERD 10/14/2007   Past Medical History:  Diagnosis Date  . Anemia   . Arthritis   . BCC (basal cell carcinoma) 07/19/2014   Right cheek  . Cataract   . GERD (gastroesophageal reflux disease)   . Hx of shoulder replacement    lt  . Hypertension   . OSA (obstructive sleep apnea)   . Pneumonia    History of  . Urinary incontinence     Past Surgical History:  Procedure Laterality Date  . ABDOMINAL HYSTERECTOMY    . EYE SURGERY     bilateral cataract extraction  . NASAL SINUS SURGERY    . SHOULDER ARTHROSCOPY     Left  . TOTAL SHOULDER ARTHROPLASTY  10/10/2011   Procedure: TOTAL SHOULDER ARTHROPLASTY;  Surgeon: Metta Clines Supple;  Location: [facility];  Service: Orthopedics;  Laterality: Right;  . UPPER GASTROINTESTINAL ENDOSCOPY    . Vaginal Sling  No current facility-administered medications for this encounter.    Current Outpatient Medications  Medication Sig Dispense Refill Last Dose  . acetaminophen (TYLENOL) 500 MG tablet Take 1,000 mg by mouth every 8 (eight) hours as needed (pain).     . Calcium Carbonate-Vitamin D (CALTRATE 600+D) 600-400 MG-UNIT per tablet Take 1 tablet by mouth 2 (two) times daily.      . diclofenac sodium (VOLTAREN) 1 % GEL Apply 1 application topically 3 (three) times daily as needed (pain).     . irbesartan (AVAPRO) 300 MG tablet Take 1 tablet (300 mg total) by mouth daily. 90 tablet 1   . meloxicam  (MOBIC) 15 MG tablet Take 15 mg by mouth daily.      . Multiple Vitamins-Minerals (MULTIVITAMIN WITH MINERALS) tablet Take 1 tablet by mouth daily.     . Multiple Vitamins-Minerals (PRESERVISION AREDS 2 PO) Take 1 capsule by mouth 2 (two) times daily.     Marland Kitchen omeprazole (PRILOSEC) 40 MG capsule TAKE (1) CAPSULE DAILY (Patient taking differently: Take 40 mg by mouth daily. ) 90 capsule 0   . PARoxetine (PAXIL) 10 MG tablet TAKE 1 TABLET DAILY 90 tablet 1   . Propylene Glycol (SYSTANE BALANCE) 0.6 % SOLN Place 1 drop into both eyes 3 (three) times daily as needed (dry eyes).     . rosuvastatin (CRESTOR) 20 MG tablet Take 1 tablet (20 mg total) by mouth daily. 90 tablet 1   . tolterodine (DETROL LA) 2 MG 24 hr capsule Take 1 capsule (2 mg total) by mouth daily. 90 capsule 1   . TURMERIC PO Take 1,000 mg by mouth daily. Take one by mouth daily       Allergies  Allergen Reactions  . Iohexol Hives     Code: HIVES, Desc: ? Contrast reaction from CT prior to 2000.   . Naproxen Itching and Rash  . Neomycin-Bacitracin Zn-Polymyx Itching and Rash    REACTION: redness  . Sulfonamide Derivatives Itching and Rash    REACTION: rash/tingling    Social History   Tobacco Use  . Smoking status: Never Smoker  . Smokeless tobacco: Never Used  Substance Use Topics  . Alcohol use: No    Family History  Problem Relation Age of Onset  . Cancer Father        Prostate cancer  . Cancer Sister        Ovarian cancer  . Diabetes Sister   . Alzheimer's disease Sister   . Alzheimer's disease Mother   . COPD Neg Hx   . Alcohol abuse Neg Hx   . Early death Neg Hx   . Heart disease Neg Hx   . Hyperlipidemia Neg Hx   . Hypertension Neg Hx   . Kidney disease Neg Hx   . Stroke Neg Hx      Review of Systems  Constitutional: Negative.   HENT: Negative.   Eyes: Negative.   Respiratory: Negative.   Cardiovascular: Negative.   Gastrointestinal: Positive for heartburn.  Genitourinary: Negative.    Musculoskeletal: Positive for back pain and joint pain.  Skin: Negative.   Neurological: Negative.   Endo/Heme/Allergies: Negative.   Psychiatric/Behavioral: Negative.     Objective:  Physical Exam  Constitutional: She is oriented to person, place, and time. She appears well-developed.  HENT:  Head: Normocephalic.  Eyes: Pupils are equal, round, and reactive to light.  Neck: Neck supple. No JVD present. No tracheal deviation present. No thyromegaly present.  Cardiovascular: Normal rate, regular rhythm  and intact distal pulses.  Respiratory: Effort normal and breath sounds normal. No respiratory distress. She has no wheezes.  GI: Soft. There is no abdominal tenderness. There is no guarding.  Musculoskeletal:     Right knee: She exhibits swelling and bony tenderness. She exhibits no ecchymosis, no deformity and no laceration. Tenderness found.     Left ankle: She exhibits decreased range of motion (can't plantar flex (previous surgeries)).  Lymphadenopathy:    She has no cervical adenopathy.  Neurological: She is alert and oriented to person, place, and time.  Skin: Skin is warm and dry.  Psychiatric: She has a normal mood and affect.     Labs:  Estimated body mass index is 42.21 kg/m as calculated from the following:   Height as of 07/19/19: 4\' 11"  (1.499 m).   Weight as of 07/19/19: 94.8 kg.   Imaging Review Plain radiographs demonstrate severe degenerative joint disease of the right knee(s).  The bone quality appears to be good for age and reported activity level.      Assessment/Plan:  End stage arthritis, right knee   The patient history, physical examination, clinical judgment of the provider and imaging studies are consistent with end stage degenerative joint disease of the right knee(s) and total knee arthroplasty is deemed medically necessary. The treatment options including medical management, injection therapy arthroscopy and arthroplasty were discussed at  length. The risks and benefits of total knee arthroplasty were presented and reviewed. The risks due to aseptic loosening, infection, stiffness, patella tracking problems, thromboembolic complications and other imponderables were discussed. The patient acknowledged the explanation, agreed to proceed with the plan and consent was signed. Patient is being admitted for inpatient treatment for surgery, pain control, PT, OT, prophylactic antibiotics, VTE prophylaxis, progressive ambulation and ADL's and discharge planning. The patient is planning to be discharged home.    Patient's anticipated LOS is less than 2 midnights, meeting these requirements: - Lives within 1 hour of care - Has a competent adult at home to recover with post-op recover - NO history of  - Chronic pain requiring opiods  - Diabetes  - Heart attack  - Stroke  - DVT/VTE  - Cardiac arrhythmia  - Respiratory Failure/COPD  - Renal failure  - Anemia  - Advanced Liver disease

## 2019-08-11 NOTE — Patient Instructions (Addendum)
DUE TO COVID-19 ONLY ONE VISITOR IS ALLOWED TO COME WITH YOU AND STAY IN THE WAITING ROOM ONLY DURING PRE OP AND PROCEDURE DAY OF SURGERY. THE 1 VISITOR MAY VISIT WITH YOU AFTER SURGERY IN YOUR PRIVATE ROOM DURING VISITING HOURS ONLY!  YOU NEED TO HAVE A COVID 19 TEST ON___Friday 09/11/2020____ @___1210  pm____, THIS TEST MUST BE DONE BEFORE SURGERY, COME  [address], [place] , [address].  ([place]) ONCE YOUR COVID TEST IS COMPLETED, PLEASE BEGIN THE QUARANTINE INSTRUCTIONS AS OUTLINED IN YOUR HANDOUT.                BRYCELYNN EMPEY  08/11/2019   Your procedure is scheduled on: Tuesday  08/17/2019   Report to [facility] Main  Entrance              Report to admitting at [facility]  AM              Please bring CPAP mask and tubing with you the morning of surgery!   Call this number if you have problems the morning of surgery [phone]    Remember: Do not eat food :After Midnight.               NO SOLID FOOD AFTER MIDNIGHT THE NIGHT PRIOR TO SURGERY. NOTHING BY MOUTH EXCEPT CLEAR LIQUIDS FROM MIDNIGHT UP  UNTIL 0715 AM .              PLEASE FINISH ENSURE DRINK PER SURGEON ORDER  WHICH NEEDS TO BE COMPLETED AT  0715 AM. .   CLEAR LIQUID DIET   Foods Allowed                                                                     Foods Excluded  Coffee and tea, regular and decaf                             liquids that you cannot  Plain Jell-O any favor except red or purple                                           see through such as: Fruit ices (not with fruit pulp)                                     milk, soups, orange juice  Iced Popsicles                                    All solid food Carbonated beverages, regular and diet                                    Cranberry, grape and apple juices Sports drinks like Gatorade Lightly seasoned clear broth or consume(fat free) Sugar, honey syrup  Sample Menu Breakfast  Lunch                                     Supper Cranberry juice                    Beef broth                            Chicken broth Jell-O                                     Grape juice                           Apple juice Coffee or tea                        Jell-O                                      Popsicle                                                Coffee or tea                        Coffee or tea  _____________________________________________________________________                BRUSH YOUR TEETH MORNING OF SURGERY AND RINSE YOUR MOUTH OUT, NO CHEWING GUM CANDY OR MINTS.     Take these medicines the morning of surgery with A SIP OF WATER: Omeprazole (Prilosec), Paroxetine (Paxil), Rosuvastatin (Crestor)                                 You may not have any metal on your body including hair pins and              piercings  Do not wear jewelry, make-up, lotions, powders or perfumes, deodorant             Do not wear nail polish.  Do not shave  48 hours prior to surgery.              Do not bring valuables to the hospital. [place].  Contacts, dentures or bridgework may not be worn into surgery.  Leave suitcase in the car. After surgery it may be brought to your room.                  Please read over the following fact sheets you were given: _____________________________________________________________________             [facility] - Preparing for Surgery Before surgery, you can play an important role.  Because skin is not sterile, your skin needs to be as free of germs as possible.  You can reduce the number of germs on your skin  by washing with CHG (chlorahexidine gluconate) soap before surgery.  CHG is an antiseptic cleaner which kills germs and bonds with the skin to continue killing germs even after washing. Please DO NOT use if you have an allergy to CHG or antibacterial soaps.  If your skin becomes  reddened/irritated stop using the CHG and inform your nurse when you arrive at Short Stay. Do not shave (including legs and underarms) for at least 48 hours prior to the first CHG shower.  You may shave your face/neck. Please follow these instructions carefully:  1.  Shower with CHG Soap the night before surgery and the  morning of Surgery.  2.  If you choose to wash your hair, wash your hair first as usual with your  normal  shampoo.  3.  After you shampoo, rinse your hair and body thoroughly to remove the  shampoo.                           4.  Use CHG as you would any other liquid soap.  You can apply chg directly  to the skin and wash                       Gently with a scrungie or clean washcloth.  5.  Apply the CHG Soap to your body ONLY FROM THE NECK DOWN.   Do not use on face/ open                           Wound or open sores. Avoid contact with eyes, ears mouth and genitals (private parts).                       Wash face,  Genitals (private parts) with your normal soap.             6.  Wash thoroughly, paying special attention to the area where your surgery  will be performed.  7.  Thoroughly rinse your body with warm water from the neck down.  8.  DO NOT shower/wash with your normal soap after using and rinsing off  the CHG Soap.                9.  Pat yourself dry with a clean towel.            10.  Wear clean pajamas.            11.  Place clean sheets on your bed the night of your first shower and do not  sleep with pets. Day of Surgery : Do not apply any lotions/deodorants the morning of surgery.  Please wear clean clothes to the hospital/surgery center.  FAILURE TO FOLLOW THESE INSTRUCTIONS MAY RESULT IN THE CANCELLATION OF YOUR SURGERY PATIENT SIGNATURE_________________________________  NURSE SIGNATURE__________________________________  ________________________________________________________________________  WHAT IS A BLOOD TRANSFUSION? Blood Transfusion Information  A  transfusion is the replacement of blood or some of its parts. Blood is made up of multiple cells which provide different functions.  Red blood cells carry oxygen and are used for blood loss replacement.  White blood cells fight against infection.  Platelets control bleeding.  Plasma helps clot blood.  Other blood products are available for specialized needs, such as hemophilia or other clotting disorders. BEFORE THE TRANSFUSION  Who gives blood for transfusions?   Healthy volunteers who are fully evaluated to make sure their  blood is safe. This is blood bank blood. Transfusion therapy is the safest it has ever been in the practice of medicine. Before blood is taken from a donor, a complete history is taken to make sure that person has no history of diseases nor engages in risky social behavior (examples are intravenous drug use or sexual activity with multiple partners). The donor's travel history is screened to minimize risk of transmitting infections, such as malaria. The donated blood is tested for signs of infectious diseases, such as HIV and hepatitis. The blood is then tested to be sure it is compatible with you in order to minimize the chance of a transfusion reaction. If you or a relative donates blood, this is often done in anticipation of surgery and is not appropriate for emergency situations. It takes many days to process the donated blood. RISKS AND COMPLICATIONS Although transfusion therapy is very safe and saves many lives, the main dangers of transfusion include:   Getting an infectious disease.  Developing a transfusion reaction. This is an allergic reaction to something in the blood you were given. Every precaution is taken to prevent this. The decision to have a blood transfusion has been considered carefully by your caregiver before blood is given. Blood is not given unless the benefits outweigh the risks. AFTER THE TRANSFUSION  Right after receiving a blood transfusion,  you will usually feel much better and more energetic. This is especially true if your red blood cells have gotten low (anemic). The transfusion raises the level of the red blood cells which carry oxygen, and this usually causes an energy increase.  The nurse administering the transfusion will monitor you carefully for complications. HOME CARE INSTRUCTIONS  No special instructions are needed after a transfusion. You may find your energy is better. Speak with your caregiver about any limitations on activity for underlying diseases you may have. SEEK MEDICAL CARE IF:   Your condition is not improving after your transfusion.  You develop redness or irritation at the intravenous (IV) site. SEEK IMMEDIATE MEDICAL CARE IF:  Any of the following symptoms occur over the next 12 hours:  Shaking chills.  You have a temperature by mouth above 102 F (38.9 C), not controlled by medicine.  Chest, back, or muscle pain.  People around you feel you are not acting correctly or are confused.  Shortness of breath or difficulty breathing.  Dizziness and fainting.  You get a rash or develop hives.  You have a decrease in urine output.  Your urine turns a dark color or changes to pink, red, or brown. Any of the following symptoms occur over the next 10 days:  You have a temperature by mouth above 102 F (38.9 C), not controlled by medicine.  Shortness of breath.  Weakness after normal activity.  The white part of the eye turns yellow (jaundice).  You have a decrease in the amount of urine or are urinating less often.  Your urine turns a dark color or changes to pink, red, or brown. Document Released: 11/15/2000 Document Revised: 02/10/2012 Document Reviewed: 07/04/2008 [facility] Patient Information 2014 [place], [place].  _______________________________________________________________________

## 2019-08-12 ENCOUNTER — Encounter (HOSPITAL_COMMUNITY): Payer: Self-pay

## 2019-08-12 ENCOUNTER — Other Ambulatory Visit: Payer: Self-pay

## 2019-08-12 ENCOUNTER — Encounter (HOSPITAL_COMMUNITY)
Admission: RE | Admit: 2019-08-12 | Discharge: 2019-08-12 | Disposition: A | Discharge status: Home / Self Care | Payer: Medicare Other | Source: Ambulatory Visit | Attending: Orthopedic Surgery | Admitting: Orthopedic Surgery

## 2019-08-12 DIAGNOSIS — Z79899 Other long term (current) drug therapy: Secondary | ICD-10-CM | POA: Insufficient documentation

## 2019-08-12 DIAGNOSIS — Z01812 Encounter for preprocedural laboratory examination: Secondary | ICD-10-CM | POA: Diagnosis not present

## 2019-08-12 DIAGNOSIS — Z20828 Contact with and (suspected) exposure to other viral communicable diseases: Secondary | ICD-10-CM | POA: Diagnosis not present

## 2019-08-12 DIAGNOSIS — M1711 Unilateral primary osteoarthritis, right knee: Secondary | ICD-10-CM | POA: Diagnosis not present

## 2019-08-12 DIAGNOSIS — G4733 Obstructive sleep apnea (adult) (pediatric): Secondary | ICD-10-CM | POA: Diagnosis not present

## 2019-08-12 DIAGNOSIS — Z90710 Acquired absence of both cervix and uterus: Secondary | ICD-10-CM | POA: Insufficient documentation

## 2019-08-12 DIAGNOSIS — Z791 Long term (current) use of non-steroidal anti-inflammatories (NSAID): Secondary | ICD-10-CM | POA: Diagnosis not present

## 2019-08-12 DIAGNOSIS — M1990 Unspecified osteoarthritis, unspecified site: Secondary | ICD-10-CM | POA: Insufficient documentation

## 2019-08-12 DIAGNOSIS — K219 Gastro-esophageal reflux disease without esophagitis: Secondary | ICD-10-CM | POA: Insufficient documentation

## 2019-08-12 DIAGNOSIS — I10 Essential (primary) hypertension: Secondary | ICD-10-CM | POA: Insufficient documentation

## 2019-08-12 DIAGNOSIS — Z96612 Presence of left artificial shoulder joint: Secondary | ICD-10-CM | POA: Diagnosis not present

## 2019-08-12 HISTORY — DX: Other complications of anesthesia, initial encounter: T88.59XA

## 2019-08-12 LAB — SURGICAL PCR SCREEN
MRSA, PCR: NEGATIVE
Staphylococcus aureus: NEGATIVE

## 2019-08-12 LAB — BASIC METABOLIC PANEL
Anion gap: 14 (ref 5–15)
BUN: 23 mg/dL (ref 8–23)
CO2: 24 mmol/L (ref 22–32)
Calcium: 10 mg/dL (ref 8.9–10.3)
Chloride: 96 mmol/L — ABNORMAL LOW (ref 98–111)
Creatinine, Ser: 1.1 mg/dL — ABNORMAL HIGH (ref 0.44–1.00)
GFR calc Af Amer: 56 mL/min — ABNORMAL LOW (ref >60)
GFR calc non Af Amer: 49 mL/min — ABNORMAL LOW (ref >60)
Glucose, Bld: 112 mg/dL — ABNORMAL HIGH (ref 70–99)
Potassium: 5.7 mmol/L — ABNORMAL HIGH (ref 3.5–5.1)
Sodium: 134 mmol/L — ABNORMAL LOW (ref 135–145)

## 2019-08-12 LAB — ABO/RH: ABO/RH(D): O NEG

## 2019-08-12 LAB — CBC
HCT: 41.1 % (ref 36.0–46.0)
Hemoglobin: 13.7 g/dL (ref 12.0–15.0)
MCH: 31.4 pg (ref 26.0–34.0)
MCHC: 33.3 g/dL (ref 30.0–36.0)
MCV: 94.3 fL (ref 80.0–100.0)
Platelets: 170 10*3/uL (ref 150–400)
RBC: 4.36 MIL/uL (ref 3.87–5.11)
RDW: 13.2 % (ref 11.5–15.5)
WBC: 7.7 10*3/uL (ref 4.0–10.5)
nRBC: 0 % (ref 0.0–0.2)

## 2019-08-12 NOTE — Progress Notes (Addendum)
PCP -Dr. Scarlette Calico  LOV- 07/19/2019  clearance Cardiologist -  Years ago saw Dr. Marlou Porch  Referred from [place] at Dr. Ronnald Ramp office as she was having syncope -wore a holter monitor and found out that her body does not retain salt. She has to monitor her salt intake.  Pulmonologist- Dr. Elsworth Soho  Chest x-ray -N/A   12/07/2018- CT chest high resolution  EKG - 05/10/2019   Stress Test - N/A ECHO - 11/04/2014 Cardiac Cath - N/A  Sleep Study - yes CPAP - yes-does not know setting  Fasting Blood Sugar -N/A Checks Blood Sugar _____ times a day  Blood Thinner Instructions: Aspirin Instructions:N/A Last Dose:  Anesthesia review:  Chart given to Iver Nestle, PA to review chart.  Patient denies shortness of breath, fever, cough and chest pain at PAT appointment   Patient verbalized understanding of instructions that were given to them at the PAT appointment. Patient was also instructed that they will need to review over the PAT instructions again at home before surgery.

## 2019-08-13 ENCOUNTER — Other Ambulatory Visit (HOSPITAL_COMMUNITY)
Admission: RE | Admit: 2019-08-13 | Discharge: 2019-08-13 | Disposition: A | Discharge status: Home / Self Care | Payer: Medicare Other | Source: Ambulatory Visit | Attending: Orthopedic Surgery | Admitting: Orthopedic Surgery

## 2019-08-13 DIAGNOSIS — Z90710 Acquired absence of both cervix and uterus: Secondary | ICD-10-CM | POA: Diagnosis not present

## 2019-08-13 DIAGNOSIS — Z01812 Encounter for preprocedural laboratory examination: Secondary | ICD-10-CM | POA: Diagnosis not present

## 2019-08-13 DIAGNOSIS — G4733 Obstructive sleep apnea (adult) (pediatric): Secondary | ICD-10-CM | POA: Diagnosis not present

## 2019-08-13 DIAGNOSIS — I10 Essential (primary) hypertension: Secondary | ICD-10-CM | POA: Diagnosis not present

## 2019-08-13 DIAGNOSIS — Z20828 Contact with and (suspected) exposure to other viral communicable diseases: Secondary | ICD-10-CM | POA: Diagnosis not present

## 2019-08-13 DIAGNOSIS — M1711 Unilateral primary osteoarthritis, right knee: Secondary | ICD-10-CM | POA: Diagnosis not present

## 2019-08-14 LAB — NOVEL CORONAVIRUS, NAA (HOSP ORDER, SEND-OUT TO REF LAB; TAT 18-24 HRS): SARS-CoV-2, NAA: NOT DETECTED

## 2019-08-16 NOTE — Progress Notes (Signed)
Anesthesia Chart Review   Case: [id number] Date/Time: 08/17/19 1000   Procedure: TOTAL KNEE ARTHROPLASTY (Right ) - 70 mins   Anesthesia type: Spinal   Pre-op diagnosis: Right knee osteoarthritis   Location: [facility]   Surgeon: Veronica Cancel, MD      DISCUSSION: 78 y.o. never smoker with h/o HTN, GERD, ILD, OSA w/CPAP, right knee OA scheduled for above procedure 08/17/2019 with Dr. Paralee Garza.   Pt seen by PCP, Dr. Scarlette Garza, 07/19/2019.  Per OV note, "She has a 0.2% MICA risk so I have recommended that she proceed with the total knee replacement surgery.  Potassium 5.7 at PAT visit 08/12/2019.  Routed to surgeon.  Will repeat DOS.   Anticipate pt can proceed with planned procedure barring acute status change.   VS: BP (!) 153/69   Pulse 66   Temp 36.8 C (Oral)   Resp 16   Ht 4\' 11"  (1.499 m)   Wt 91.2 kg   SpO2 100%   BMI 40.60 kg/m   PROVIDERS: Veronica Lima, MD is PCP   Veronica Mead, MD is Pulmonlogist last seen 12/15/2018, stable at this visit.  LABS: Labs reviewed: Acceptable for surgery. and Labs routed to surgeon.  Will repeat BMP DOS.  (all labs ordered are listed, but only abnormal results are displayed)  Labs Reviewed  BASIC METABOLIC PANEL - Abnormal; Notable for the following components:      Result Value   Sodium 134 (*)    Potassium 5.7 (*)    Chloride 96 (*)    Glucose, Bld 112 (*)    Creatinine, Ser 1.10 (*)    GFR calc non Af Amer 49 (*)    GFR calc Af Amer 56 (*)    All other components within normal limits  SURGICAL PCR SCREEN  CBC  TYPE AND SCREEN  ABO/RH     IMAGES: CT Chest 12/07/2018 IMPRESSION: 1. Assessment for interstitial lung disease is markedly limited by expiratory phase imaging and respiratory motion. Possible mild nonspecific subpleural reticulation in the lateral aspects of the mid and lower lung zones. Consider follow-up high-resolution chest CT when the patient is less short of breath and can follow  breathing instructions. 2. Pulmonary nodules measure 4 mm or less in size. No follow-up needed if patient is low-risk (and has no known or suspected primary neoplasm). Non-contrast chest CT can be considered in 12 months if patient is high-risk. This recommendation follows the consensus statement: Guidelines for Management of Incidental Pulmonary Nodules Detected on CT Images: From the Fleischner Society 2017; Radiology 2017; 284:228-243. 3.  Aortic atherosclerosis (ICD10-170.0).  EKG: 05/10/2019 Rate 81 bpm Sinus rhythm  Left axis-anterior fascicular block   CV:  Past Medical History:  Diagnosis Date  . Anemia   . Arthritis   . BCC (basal cell carcinoma) 07/19/2014   Right cheek  . Cataract   . Complication of anesthesia    takes patient a long time to wake up  . GERD (gastroesophageal reflux disease)   . Hx of shoulder replacement    lt  . Hypertension   . OSA (obstructive sleep apnea)    use CPAP  . Pneumonia    History of  . Urinary incontinence     Past Surgical History:  Procedure Laterality Date  . ABDOMINAL HYSTERECTOMY    . EYE SURGERY     bilateral cataract extraction  . FINGER SURGERY     cyst left middle finger  . JOINT  REPLACEMENT     left shoulder  . NASAL SINUS SURGERY    . SHOULDER ARTHROSCOPY     Left  . TOTAL SHOULDER ARTHROPLASTY  10/10/2011   Procedure: TOTAL SHOULDER ARTHROPLASTY;  Surgeon: Veronica Garza;  Location: [facility];  Service: Orthopedics;  Laterality: Right;  . UPPER GASTROINTESTINAL ENDOSCOPY    . Vaginal Sling      MEDICATIONS: . acetaminophen (TYLENOL) 500 MG tablet  . Calcium Carbonate-Vitamin D (CALTRATE 600+D) 600-400 MG-UNIT per tablet  . diclofenac sodium (VOLTAREN) 1 % GEL  . irbesartan (AVAPRO) 300 MG tablet  . meloxicam (MOBIC) 15 MG tablet  . Multiple Vitamins-Minerals (MULTIVITAMIN WITH MINERALS) tablet  . Multiple Vitamins-Minerals (PRESERVISION AREDS 2 PO)  . omeprazole (PRILOSEC) 40 MG capsule  . PARoxetine  (PAXIL) 10 MG tablet  . Propylene Glycol (SYSTANE BALANCE) 0.6 % SOLN  . rosuvastatin (CRESTOR) 20 MG tablet  . tolterodine (DETROL LA) 2 MG 24 hr capsule  . TURMERIC PO   No current facility-administered medications for this encounter.    [name] [facility] Pre-Surgical Testing [phone] 08/16/19  11:30 AM

## 2019-08-16 NOTE — Anesthesia Preprocedure Evaluation (Addendum)
Anesthesia Evaluation  Patient identified by MRN, date of birth, ID band Patient awake    Reviewed: Allergy & Precautions, NPO status , Patient's Chart, lab work & pertinent test results  Airway Mallampati: II  TM Distance: >3 FB Neck ROM: Full    Dental  (+) Teeth Intact, Dental Advisory Given   Pulmonary    breath sounds clear to auscultation       Cardiovascular hypertension,  Rhythm:Regular Rate:Normal     Neuro/Psych    GI/Hepatic   Endo/Other    Renal/GU      Musculoskeletal   Abdominal (+) + obese,   Peds  Hematology   Anesthesia Other Findings   Reproductive/Obstetrics                            Anesthesia Physical Anesthesia Plan  ASA: III  Anesthesia Plan: Spinal and MAC   Post-op Pain Management:  Regional for Post-op pain   Induction:   PONV Risk Score and Plan: Ondansetron and Dexamethasone  Airway Management Planned: Natural Airway and Simple Face Mask  Additional Equipment:   Intra-op Plan:   Post-operative Plan:   Informed Consent: I have reviewed the patients History and Physical, chart, labs and discussed the procedure including the risks, benefits and alternatives for the proposed anesthesia with the patient or authorized representative who has indicated his/her understanding and acceptance.     Dental advisory given  Plan Discussed with: CRNA and Anesthesiologist  Anesthesia Plan Comments: (See PAT note 08/12/2019, Konrad Felix, PA-C)       Anesthesia Quick Evaluation

## 2019-08-17 ENCOUNTER — Ambulatory Visit (HOSPITAL_COMMUNITY): Payer: Medicare Other | Admitting: Physician Assistant

## 2019-08-17 ENCOUNTER — Observation Stay (HOSPITAL_COMMUNITY)
Admission: RE | Admit: 2019-08-17 | Discharge: 2019-08-18 | Disposition: A | Discharge status: Home / Self Care | Payer: Medicare Other | Source: Other Acute Inpatient Hospital | Attending: Orthopedic Surgery | Admitting: Orthopedic Surgery

## 2019-08-17 ENCOUNTER — Encounter (HOSPITAL_COMMUNITY): Payer: Self-pay | Admitting: *Deleted

## 2019-08-17 ENCOUNTER — Encounter (HOSPITAL_COMMUNITY)
Admission: RE | Disposition: A | Discharge status: Home / Self Care | Payer: Self-pay | Source: Other Acute Inpatient Hospital | Attending: Orthopedic Surgery

## 2019-08-17 ENCOUNTER — Other Ambulatory Visit: Payer: Self-pay

## 2019-08-17 DIAGNOSIS — Z96611 Presence of right artificial shoulder joint: Secondary | ICD-10-CM | POA: Insufficient documentation

## 2019-08-17 DIAGNOSIS — Z79899 Other long term (current) drug therapy: Secondary | ICD-10-CM | POA: Diagnosis not present

## 2019-08-17 DIAGNOSIS — K219 Gastro-esophageal reflux disease without esophagitis: Secondary | ICD-10-CM | POA: Diagnosis not present

## 2019-08-17 DIAGNOSIS — Z791 Long term (current) use of non-steroidal anti-inflammatories (NSAID): Secondary | ICD-10-CM | POA: Insufficient documentation

## 2019-08-17 DIAGNOSIS — I10 Essential (primary) hypertension: Secondary | ICD-10-CM | POA: Diagnosis not present

## 2019-08-17 DIAGNOSIS — M1711 Unilateral primary osteoarthritis, right knee: Secondary | ICD-10-CM | POA: Diagnosis not present

## 2019-08-17 DIAGNOSIS — Z96612 Presence of left artificial shoulder joint: Secondary | ICD-10-CM | POA: Diagnosis not present

## 2019-08-17 DIAGNOSIS — G4733 Obstructive sleep apnea (adult) (pediatric): Secondary | ICD-10-CM | POA: Insufficient documentation

## 2019-08-17 DIAGNOSIS — G8918 Other acute postprocedural pain: Secondary | ICD-10-CM | POA: Diagnosis not present

## 2019-08-17 DIAGNOSIS — E785 Hyperlipidemia, unspecified: Secondary | ICD-10-CM | POA: Diagnosis not present

## 2019-08-17 DIAGNOSIS — Z85828 Personal history of other malignant neoplasm of skin: Secondary | ICD-10-CM | POA: Diagnosis not present

## 2019-08-17 DIAGNOSIS — Z96651 Presence of right artificial knee joint: Secondary | ICD-10-CM

## 2019-08-17 DIAGNOSIS — Z20828 Contact with and (suspected) exposure to other viral communicable diseases: Secondary | ICD-10-CM | POA: Insufficient documentation

## 2019-08-17 DIAGNOSIS — M8580 Other specified disorders of bone density and structure, unspecified site: Secondary | ICD-10-CM | POA: Insufficient documentation

## 2019-08-17 DIAGNOSIS — Z96652 Presence of left artificial knee joint: Secondary | ICD-10-CM

## 2019-08-17 DIAGNOSIS — K222 Esophageal obstruction: Secondary | ICD-10-CM | POA: Diagnosis not present

## 2019-08-17 HISTORY — PX: TOTAL KNEE ARTHROPLASTY: SHX125

## 2019-08-17 LAB — BASIC METABOLIC PANEL
Anion gap: 9 (ref 5–15)
BUN: 21 mg/dL (ref 8–23)
CO2: 24 mmol/L (ref 22–32)
Calcium: 9.8 mg/dL (ref 8.9–10.3)
Chloride: 105 mmol/L (ref 98–111)
Creatinine, Ser: 1.01 mg/dL — ABNORMAL HIGH (ref 0.44–1.00)
GFR calc Af Amer: >60 mL/min (ref >60)
GFR calc non Af Amer: 54 mL/min — ABNORMAL LOW (ref >60)
Glucose, Bld: 88 mg/dL (ref 70–99)
Potassium: 4.2 mmol/L (ref 3.5–5.1)
Sodium: 138 mmol/L (ref 135–145)

## 2019-08-17 LAB — TYPE AND SCREEN
ABO/RH(D): O NEG
Antibody Screen: NEGATIVE

## 2019-08-17 SURGERY — ARTHROPLASTY, KNEE, TOTAL
Anesthesia: Monitor Anesthesia Care | Site: Knee | Laterality: Right

## 2019-08-17 MED ORDER — OMEPRAZOLE 20 MG PO CPDR
40.0000 mg | DELAYED_RELEASE_CAPSULE | Freq: Every day | ORAL | Status: DC
  Administered 2019-08-18: 40 mg via ORAL
  Filled 2019-08-17: qty 2

## 2019-08-17 MED ORDER — ROSUVASTATIN CALCIUM 20 MG PO TABS
20.0000 mg | ORAL_TABLET | Freq: Every day | ORAL | Status: DC
  Administered 2019-08-18: 20 mg via ORAL
  Filled 2019-08-17: qty 1

## 2019-08-17 MED ORDER — ONDANSETRON HCL 4 MG/2ML IJ SOLN
INTRAMUSCULAR | Status: DC | PRN
  Administered 2019-08-17: 4 mg via INTRAVENOUS

## 2019-08-17 MED ORDER — BISACODYL 10 MG RE SUPP: 10.0000 mg | Freq: Every day | RECTAL | Status: DC | PRN

## 2019-08-17 MED ORDER — PROPOFOL 500 MG/50ML IV EMUL
INTRAVENOUS | Status: DC | PRN
  Administered 2019-08-17: 75 ug/kg/min via INTRAVENOUS

## 2019-08-17 MED ORDER — BUPIVACAINE-EPINEPHRINE (PF) 0.5% -1:200000 IJ SOLN
INTRAMUSCULAR | Status: DC | PRN
  Administered 2019-08-17: 30 mL

## 2019-08-17 MED ORDER — METHOCARBAMOL 500 MG IVPB - SIMPLE MED
INTRAVENOUS | Status: AC
  Filled 2019-08-17: qty 50

## 2019-08-17 MED ORDER — FENTANYL CITRATE (PF) 100 MCG/2ML IJ SOLN
50.0000 ug | INTRAMUSCULAR | Status: DC
  Administered 2019-08-17: 09:00:00 100 ug via INTRAVENOUS

## 2019-08-17 MED ORDER — PHENYLEPHRINE 40 MCG/ML (10ML) SYRINGE FOR IV PUSH (FOR BLOOD PRESSURE SUPPORT)
PREFILLED_SYRINGE | INTRAVENOUS | Status: DC | PRN
  Administered 2019-08-17 (×3): 80 ug via INTRAVENOUS
  Administered 2019-08-17 (×2): 40 ug via INTRAVENOUS
  Administered 2019-08-17: 80 ug via INTRAVENOUS
  Administered 2019-08-17: 40 ug via INTRAVENOUS
  Administered 2019-08-17 (×3): 80 ug via INTRAVENOUS

## 2019-08-17 MED ORDER — ONDANSETRON HCL 4 MG/2ML IJ SOLN: 4.0000 mg | Freq: Four times a day (QID) | INTRAMUSCULAR | Status: DC | PRN

## 2019-08-17 MED ORDER — OXYCODONE HCL 5 MG PO TABS: 5.0000 mg | ORAL_TABLET | Freq: Once | ORAL | Status: DC | PRN

## 2019-08-17 MED ORDER — METOCLOPRAMIDE HCL 5 MG PO TABS: 5.0000 mg | ORAL_TABLET | Freq: Three times a day (TID) | ORAL | Status: DC | PRN

## 2019-08-17 MED ORDER — ACETAMINOPHEN 325 MG PO TABS: 325.0000 mg | ORAL_TABLET | Freq: Four times a day (QID) | ORAL | Status: DC | PRN

## 2019-08-17 MED ORDER — SODIUM CHLORIDE (PF) 0.9 % IJ SOLN
INTRAMUSCULAR | Status: DC | PRN
  Administered 2019-08-17: 30 mL

## 2019-08-17 MED ORDER — CHLORHEXIDINE GLUCONATE 4 % EX LIQD: 60.0000 mL | Freq: Once | CUTANEOUS | Status: DC

## 2019-08-17 MED ORDER — PHENOL 1.4 % MT LIQD
1.0000 | OROMUCOSAL | Status: DC | PRN
  Filled 2019-08-17: qty 177

## 2019-08-17 MED ORDER — OXYCODONE HCL 5 MG/5ML PO SOLN: 5.0000 mg | Freq: Once | ORAL | Status: DC | PRN

## 2019-08-17 MED ORDER — ONDANSETRON HCL 4 MG PO TABS: 4.0000 mg | ORAL_TABLET | Freq: Four times a day (QID) | ORAL | Status: DC | PRN

## 2019-08-17 MED ORDER — METOCLOPRAMIDE HCL 5 MG/ML IJ SOLN: 5.0000 mg | Freq: Three times a day (TID) | INTRAMUSCULAR | Status: DC | PRN

## 2019-08-17 MED ORDER — DOCUSATE SODIUM 100 MG PO CAPS
100.0000 mg | ORAL_CAPSULE | Freq: Two times a day (BID) | ORAL | Status: DC
  Administered 2019-08-17 – 2019-08-18 (×2): 100 mg via ORAL
  Filled 2019-08-17 (×2): qty 1

## 2019-08-17 MED ORDER — ONDANSETRON HCL 4 MG/2ML IJ SOLN
INTRAMUSCULAR | Status: AC
  Filled 2019-08-17: qty 2

## 2019-08-17 MED ORDER — PAROXETINE HCL 10 MG PO TABS
10.0000 mg | ORAL_TABLET | Freq: Every day | ORAL | Status: DC
  Administered 2019-08-18: 10 mg via ORAL
  Filled 2019-08-17: qty 1

## 2019-08-17 MED ORDER — NON FORMULARY: 40.0000 mg | Freq: Every day | Status: DC

## 2019-08-17 MED ORDER — FERROUS SULFATE 325 (65 FE) MG PO TABS
325.0000 mg | ORAL_TABLET | Freq: Two times a day (BID) | ORAL | Status: DC
  Administered 2019-08-17 – 2019-08-18 (×2): 325 mg via ORAL
  Filled 2019-08-17 (×2): qty 1

## 2019-08-17 MED ORDER — ONDANSETRON HCL 4 MG/2ML IJ SOLN: 4.0000 mg | Freq: Once | INTRAMUSCULAR | Status: DC | PRN

## 2019-08-17 MED ORDER — METHOCARBAMOL 500 MG IVPB - SIMPLE MED
500.0000 mg | Freq: Four times a day (QID) | INTRAVENOUS | Status: DC | PRN
  Filled 2019-08-17: qty 50

## 2019-08-17 MED ORDER — PHENYLEPHRINE HCL-NACL 10-0.9 MG/250ML-% IV SOLN
INTRAVENOUS | Status: AC
  Filled 2019-08-17: qty 250

## 2019-08-17 MED ORDER — POLYETHYLENE GLYCOL 3350 17 G PO PACK
17.0000 g | PACK | Freq: Two times a day (BID) | ORAL | Status: DC
  Administered 2019-08-17 – 2019-08-18 (×2): 17 g via ORAL
  Filled 2019-08-17 (×2): qty 1

## 2019-08-17 MED ORDER — HYDROCODONE-ACETAMINOPHEN 7.5-325 MG PO TABS
1.0000 | ORAL_TABLET | ORAL | Status: DC | PRN
  Administered 2019-08-17 (×2): 1 via ORAL
  Filled 2019-08-17 (×2): qty 1

## 2019-08-17 MED ORDER — LACTATED RINGERS IV SOLN
INTRAVENOUS | Status: DC
  Administered 2019-08-17: 08:00:00 via INTRAVENOUS

## 2019-08-17 MED ORDER — DIPHENHYDRAMINE HCL 12.5 MG/5ML PO ELIX: 12.5000 mg | ORAL_SOLUTION | ORAL | Status: DC | PRN

## 2019-08-17 MED ORDER — PROPOFOL 10 MG/ML IV BOLUS
INTRAVENOUS | Status: AC
  Filled 2019-08-17: qty 80

## 2019-08-17 MED ORDER — STERILE WATER FOR IRRIGATION IR SOLN
Status: DC | PRN
  Administered 2019-08-17 (×2): 1000 mL

## 2019-08-17 MED ORDER — ASPIRIN 81 MG PO CHEW
81.0000 mg | CHEWABLE_TABLET | Freq: Two times a day (BID) | ORAL | Status: DC
  Administered 2019-08-17 – 2019-08-18 (×2): 81 mg via ORAL
  Filled 2019-08-17 (×2): qty 1

## 2019-08-17 MED ORDER — DEXAMETHASONE SODIUM PHOSPHATE 10 MG/ML IJ SOLN
10.0000 mg | Freq: Once | INTRAMUSCULAR | Status: AC
  Administered 2019-08-17: 10:00:00 10 mg via INTRAVENOUS

## 2019-08-17 MED ORDER — FENTANYL CITRATE (PF) 100 MCG/2ML IJ SOLN: 25.0000 ug | INTRAMUSCULAR | Status: DC | PRN

## 2019-08-17 MED ORDER — HYDROMORPHONE HCL 1 MG/ML IJ SOLN
0.5000 mg | INTRAMUSCULAR | Status: DC | PRN
  Administered 2019-08-17: 0.5 mg via INTRAVENOUS
  Filled 2019-08-17: qty 1

## 2019-08-17 MED ORDER — METHOCARBAMOL 500 MG PO TABS: 500.0000 mg | ORAL_TABLET | Freq: Four times a day (QID) | ORAL | Status: DC | PRN

## 2019-08-17 MED ORDER — BUPIVACAINE-EPINEPHRINE (PF) 0.5% -1:200000 IJ SOLN
INTRAMUSCULAR | Status: AC
  Filled 2019-08-17: qty 30

## 2019-08-17 MED ORDER — CEFAZOLIN SODIUM-DEXTROSE 2-4 GM/100ML-% IV SOLN
2.0000 g | Freq: Four times a day (QID) | INTRAVENOUS | Status: AC
  Administered 2019-08-17 (×2): 2 g via INTRAVENOUS
  Filled 2019-08-17 (×2): qty 100

## 2019-08-17 MED ORDER — ALUM & MAG HYDROXIDE-SIMETH 200-200-20 MG/5ML PO SUSP: 15.0000 mL | ORAL | Status: DC | PRN

## 2019-08-17 MED ORDER — IRBESARTAN 150 MG PO TABS
300.0000 mg | ORAL_TABLET | Freq: Every day | ORAL | Status: DC
  Administered 2019-08-17 – 2019-08-18 (×2): 300 mg via ORAL
  Filled 2019-08-17 (×2): qty 2

## 2019-08-17 MED ORDER — SODIUM CHLORIDE 0.9 % IV SOLN
INTRAVENOUS | Status: DC
  Administered 2019-08-17: 17:00:00 via INTRAVENOUS

## 2019-08-17 MED ORDER — MIDAZOLAM HCL 2 MG/2ML IJ SOLN
1.0000 mg | INTRAMUSCULAR | Status: DC
  Administered 2019-08-17: 09:00:00 1 mg via INTRAVENOUS

## 2019-08-17 MED ORDER — MENTHOL 3 MG MT LOZG: 1.0000 | LOZENGE | OROMUCOSAL | Status: DC | PRN

## 2019-08-17 MED ORDER — MAGNESIUM CITRATE PO SOLN: 1.0000 | Freq: Once | ORAL | Status: DC | PRN

## 2019-08-17 MED ORDER — KETOROLAC TROMETHAMINE 30 MG/ML IJ SOLN
INTRAMUSCULAR | Status: DC | PRN
  Administered 2019-08-17: 30 mg

## 2019-08-17 MED ORDER — MIDAZOLAM HCL 2 MG/2ML IJ SOLN
INTRAMUSCULAR | Status: AC
  Administered 2019-08-17: 09:00:00 1 mg via INTRAVENOUS
  Filled 2019-08-17: qty 2

## 2019-08-17 MED ORDER — DEXAMETHASONE SODIUM PHOSPHATE 10 MG/ML IJ SOLN
INTRAMUSCULAR | Status: AC
  Filled 2019-08-17: qty 1

## 2019-08-17 MED ORDER — SODIUM CHLORIDE 0.9 % IR SOLN
Status: DC | PRN
  Administered 2019-08-17: 1000 mL

## 2019-08-17 MED ORDER — FENTANYL CITRATE (PF) 100 MCG/2ML IJ SOLN
INTRAMUSCULAR | Status: AC
  Administered 2019-08-17: 09:00:00 100 ug via INTRAVENOUS
  Filled 2019-08-17: qty 2

## 2019-08-17 MED ORDER — HYDROCODONE-ACETAMINOPHEN 5-325 MG PO TABS
1.0000 | ORAL_TABLET | ORAL | Status: DC | PRN
  Administered 2019-08-18: 03:00:00 2 via ORAL
  Administered 2019-08-18 (×2): 1 via ORAL
  Filled 2019-08-17: qty 2
  Filled 2019-08-17 (×2): qty 1

## 2019-08-17 MED ORDER — POVIDONE-IODINE 10 % EX SWAB
2.0000 "application " | Freq: Once | CUTANEOUS | Status: AC
  Administered 2019-08-17: 2 via TOPICAL

## 2019-08-17 MED ORDER — TRANEXAMIC ACID-NACL 1000-0.7 MG/100ML-% IV SOLN
1000.0000 mg | Freq: Once | INTRAVENOUS | Status: AC
  Administered 2019-08-17: 1000 mg via INTRAVENOUS
  Filled 2019-08-17: qty 100

## 2019-08-17 MED ORDER — KETOROLAC TROMETHAMINE 30 MG/ML IJ SOLN
INTRAMUSCULAR | Status: AC
  Filled 2019-08-17: qty 1

## 2019-08-17 MED ORDER — SODIUM CHLORIDE (PF) 0.9 % IJ SOLN
INTRAMUSCULAR | Status: AC
  Filled 2019-08-17: qty 50

## 2019-08-17 MED ORDER — CEFAZOLIN SODIUM-DEXTROSE 2-4 GM/100ML-% IV SOLN
2.0000 g | INTRAVENOUS | Status: AC
  Administered 2019-08-17: 2 g via INTRAVENOUS
  Filled 2019-08-17: qty 100

## 2019-08-17 MED ORDER — DEXAMETHASONE SODIUM PHOSPHATE 10 MG/ML IJ SOLN
10.0000 mg | Freq: Once | INTRAMUSCULAR | Status: AC
  Administered 2019-08-18: 09:00:00 10 mg via INTRAVENOUS
  Filled 2019-08-17: qty 1

## 2019-08-17 MED ORDER — TRANEXAMIC ACID-NACL 1000-0.7 MG/100ML-% IV SOLN
1000.0000 mg | INTRAVENOUS | Status: AC
  Administered 2019-08-17: 10:00:00 1000 mg via INTRAVENOUS
  Filled 2019-08-17: qty 100

## 2019-08-17 SURGICAL SUPPLY — 65 items
ADH SKN CLS APL DERMABOND .7 (GAUZE/BANDAGES/DRESSINGS) ×1
ATTUNE MED ANAT PAT 32 KNEE (Knees) ×1 IMPLANT
ATTUNE MED ANAT PAT 32MM KNEE (Knees) ×1 IMPLANT
ATTUNE PS FEM RT SZ 3 CEM KNEE (Femur) ×2 IMPLANT
ATTUNE PSRP INSR SZ3 7 KNEE (Insert) ×1 IMPLANT
ATTUNE PSRP INSR SZ3 7MM KNEE (Insert) ×1 IMPLANT
BAG SPEC THK2 15X12 ZIP CLS (MISCELLANEOUS)
BAG ZIPLOCK 12X15 (MISCELLANEOUS) IMPLANT
BASEPLATE TIBIAL ROTATING SZ 4 (Knees) ×2 IMPLANT
BLADE SAW SGTL 11.0X1.19X90.0M (BLADE) IMPLANT
BLADE SAW SGTL 13.0X1.19X90.0M (BLADE) ×3 IMPLANT
BLADE SURG SZ10 CARB STEEL (BLADE) ×6 IMPLANT
BNDG ELASTIC 6X5.8 VLCR STR LF (GAUZE/BANDAGES/DRESSINGS) ×3 IMPLANT
BOWL SMART MIX CTS (DISPOSABLE) ×3 IMPLANT
BSPLAT TIB 4 CMNT ROT PLAT STR (Knees) ×1 IMPLANT
CEMENT HV SMART SET (Cement) ×2 IMPLANT
COVER SURGICAL LIGHT HANDLE (MISCELLANEOUS) ×3 IMPLANT
COVER WAND RF STERILE (DRAPES) IMPLANT
CUFF TOURN SGL QUICK 34 (TOURNIQUET CUFF) ×3
CUFF TRNQT CYL 34X4.125X (TOURNIQUET CUFF) ×1 IMPLANT
DECANTER SPIKE VIAL GLASS SM (MISCELLANEOUS) ×6 IMPLANT
DERMABOND ADVANCED (GAUZE/BANDAGES/DRESSINGS) ×2
DERMABOND ADVANCED .7 DNX12 (GAUZE/BANDAGES/DRESSINGS) ×1 IMPLANT
DRAPE U-SHAPE 47X51 STRL (DRAPES) ×3 IMPLANT
DRESSING AQUACEL AG SP 3.5X10 (GAUZE/BANDAGES/DRESSINGS) ×1 IMPLANT
DRSG AQUACEL AG SP 3.5X10 (GAUZE/BANDAGES/DRESSINGS) ×3
DURAPREP 26ML APPLICATOR (WOUND CARE) ×6 IMPLANT
ELECT REM PT RETURN 15FT ADLT (MISCELLANEOUS) ×3 IMPLANT
FACESHIELD WRAPAROUND (MASK) ×3 IMPLANT
FACESHIELD WRAPAROUND OR TEAM (MASK) IMPLANT
GLOVE BIO SURGEON STRL SZ 6 (GLOVE) ×3 IMPLANT
GLOVE BIOGEL PI IND STRL 6.5 (GLOVE) ×1 IMPLANT
GLOVE BIOGEL PI IND STRL 7.5 (GLOVE) ×1 IMPLANT
GLOVE BIOGEL PI IND STRL 8.5 (GLOVE) ×1 IMPLANT
GLOVE BIOGEL PI INDICATOR 6.5 (GLOVE) ×2
GLOVE BIOGEL PI INDICATOR 7.5 (GLOVE) ×2
GLOVE BIOGEL PI INDICATOR 8.5 (GLOVE) ×2
GLOVE ECLIPSE 8.0 STRL XLNG CF (GLOVE) ×3 IMPLANT
GLOVE ORTHO TXT STRL SZ7.5 (GLOVE) ×3 IMPLANT
GOWN STRL REUS W/ TWL LRG LVL3 (GOWN DISPOSABLE) ×1 IMPLANT
GOWN STRL REUS W/TWL 2XL LVL3 (GOWN DISPOSABLE) ×3 IMPLANT
GOWN STRL REUS W/TWL LRG LVL3 (GOWN DISPOSABLE) ×6 IMPLANT
HANDPIECE INTERPULSE COAX TIP (DISPOSABLE) ×3
HOLDER FOLEY CATH W/STRAP (MISCELLANEOUS) IMPLANT
KIT TURNOVER KIT A (KITS) IMPLANT
MANIFOLD NEPTUNE II (INSTRUMENTS) ×3 IMPLANT
NDL SAFETY ECLIPSE 18X1.5 (NEEDLE) IMPLANT
NEEDLE HYPO 18GX1.5 SHARP (NEEDLE)
NS IRRIG 1000ML POUR BTL (IV SOLUTION) ×3 IMPLANT
PACK TOTAL KNEE CUSTOM (KITS) ×3 IMPLANT
PIN DRILL FIX HALF THREAD (BIT) ×2 IMPLANT
PIN STEINMAN FIXATION KNEE (PIN) ×2 IMPLANT
PROTECTOR NERVE ULNAR (MISCELLANEOUS) ×3 IMPLANT
SET HNDPC FAN SPRY TIP SCT (DISPOSABLE) ×1 IMPLANT
SET PAD KNEE POSITIONER (MISCELLANEOUS) ×3 IMPLANT
SUT MNCRL AB 4-0 PS2 18 (SUTURE) ×3 IMPLANT
SUT STRATAFIX PDS+ 0 24IN (SUTURE) ×3 IMPLANT
SUT VIC AB 1 CT1 36 (SUTURE) ×3 IMPLANT
SUT VIC AB 2-0 CT1 27 (SUTURE) ×9
SUT VIC AB 2-0 CT1 TAPERPNT 27 (SUTURE) ×3 IMPLANT
SYR 3ML LL SCALE MARK (SYRINGE) ×3 IMPLANT
TRAY FOLEY MTR SLVR 16FR STAT (SET/KITS/TRAYS/PACK) ×3 IMPLANT
WATER STERILE IRR 1000ML POUR (IV SOLUTION) ×6 IMPLANT
WRAP KNEE MAXI GEL POST OP (GAUZE/BANDAGES/DRESSINGS) ×3 IMPLANT
YANKAUER SUCT BULB TIP 10FT TU (MISCELLANEOUS) ×3 IMPLANT

## 2019-08-17 NOTE — Interval H&P Note (Signed)
History and Physical Interval Note:  08/17/2019 7:23 AM  Veronica Garza  has presented today for surgery, with the diagnosis of Right knee osteoarthritis.  The various methods of treatment have been discussed with the patient and family. After consideration of risks, benefits and other options for treatment, the patient has consented to  Procedure(s) with comments: TOTAL KNEE ARTHROPLASTY (Right) - 70 mins as a surgical intervention.  The patient's history has been reviewed, patient examined, no change in status, stable for surgery.  I have reviewed the patient's chart and labs.  Questions were answered to the patient's satisfaction.     Mauri Pole

## 2019-08-17 NOTE — Anesthesia Postprocedure Evaluation (Signed)
Anesthesia Post Note  Patient: Veronica Garza  Procedure(s) Performed: TOTAL KNEE ARTHROPLASTY (Right Knee)     Patient location during evaluation: PACU Anesthesia Type: MAC Level of consciousness: oriented and awake and alert Pain management: pain level controlled Vital Signs Assessment: post-procedure vital signs reviewed and stable Respiratory status: spontaneous breathing, respiratory function stable and patient connected to nasal cannula oxygen Cardiovascular status: blood pressure returned to baseline and stable Postop Assessment: no headache, no backache and no apparent nausea or vomiting Anesthetic complications: no    Last Vitals:  Vitals:   08/17/19 1345 08/17/19 1500  BP: 127/74 135/81  Pulse: 90 93  Resp: 12 11  Temp: 36.6 C   SpO2: 98% 99%    Last Pain:  Vitals:   08/17/19 1500  TempSrc:   PainSc: Asleep                 Areona Homer COKER

## 2019-08-17 NOTE — Progress Notes (Signed)
AssistedDr. Joslin with right, ultrasound guided, adductor canal block. Side rails up, monitors on throughout procedure. See vital signs in flow sheet. Tolerated Procedure well.  

## 2019-08-17 NOTE — Op Note (Signed)
NAME:  Veronica Garza                      MEDICAL RECORD NO.:  [id number]                             FACILITY:  [facility]      PHYSICIAN:  Pietro Cassis. Alvan Dame, M.D.  DATE OF BIRTH:  Dec 30, 1940      DATE OF PROCEDURE:  08/17/2019                                     OPERATIVE REPORT         PREOPERATIVE DIAGNOSIS:  Right knee osteoarthritis.      POSTOPERATIVE DIAGNOSIS:  Right knee osteoarthritis.      FINDINGS:  The patient was noted to have complete loss of cartilage and   bone-on-bone arthritis with associated osteophytes in the medial and patellofemoral compartments of   the knee with a significant synovitis and associated effusion.  The patient had failed months of conservative treatment including medications, injection therapy, activity modification.     PROCEDURE:  Right total knee replacement.      COMPONENTS USED:  DePuy Attune rotating platform posterior stabilized knee   system, a size 3 femur, 4 tibia, size 7 mm PS AOX insert, and 32 anatomic patellar   button.      SURGEON:  Pietro Cassis. Alvan Dame, M.D.      ASSISTANT:  Danae Orleans, PA-C.      ANESTHESIA:  Regional and Spinal.      SPECIMENS:  None.      COMPLICATION:  None.      DRAINS:  None.  EBL: <100cc      TOURNIQUET TIME:  30 min at 250 mmHg   The patient was stable to the recovery room.      INDICATION FOR PROCEDURE:  Veronica Garza is a 78 y.o. female patient of   mine.  The patient had been seen, evaluated, and treated for months conservatively in the   office with medication, activity modification, and injections.  The patient had   radiographic changes of bone-on-bone arthritis with endplate sclerosis and osteophytes noted.  Based on the radiographic changes and failed conservative measures, the patient   decided to proceed with definitive treatment, total knee replacement.  Risks of infection, DVT, component failure, need for revision surgery, neurovascular injury were reviewed in the office setting.   The postop course was reviewed stressing the efforts to maximize post-operative satisfaction and function.  Consent was obtained for benefit of pain   relief.      PROCEDURE IN DETAIL:  The patient was brought to the operative theater.   Once adequate anesthesia, preoperative antibiotics, 2 gm of Ancef,1 gm of Tranexamic Acid, and 10 mg of Decadron administered, the patient was positioned supine with a right thigh tourniquet placed.  The  right lower extremity was prepped and draped in sterile fashion.  A time-   out was performed identifying the patient, planned procedure, and the appropriate extremity.      The right lower extremity was placed in the [facility] leg holder.  The leg was   exsanguinated, tourniquet elevated to 250 mmHg.  A midline incision was   made followed by median parapatellar arthrotomy.  Following initial   exposure, attention was first directed to  the patella.  Precut   measurement was noted to be 21 mm.  I resected down to 13 mm and used a   32 anatomic patellar button to restore patellar height as well as cover the cut surface.      The lug holes were drilled and a metal shim was placed to protect the   patella from retractors and saw blade during the procedure.      At this point, attention was now directed to the femur.  The femoral   canal was opened with a drill, irrigated to try to prevent fat emboli.  An   intramedullary rod was passed at 3 degrees valgus, 9 mm of bone was   resected off the distal femur.  Following this resection, the tibia was   subluxated anteriorly.  Using the extramedullary guide, 2 mm of bone was resected off   the proximal medial tibia.  We confirmed the gap would be   stable medially and laterally with a size 5 spacer block as well as confirmed that the tibial cut was perpendicular in the coronal plane, checking with an alignment rod.      Once this was done, I sized the femur to be a size 3 in the anterior-   posterior dimension, chose  a standard component based on medial and   lateral dimension.  The size 3 rotation block was then pinned in   position anterior referenced using the C-clamp to set rotation.  The   anterior, posterior, and  chamfer cuts were made without difficulty nor   notching making certain that I was along the anterior cortex to help   with flexion gap stability.      The final box cut was made off the lateral aspect of distal femur.      At this point, the tibia was sized to be a size 4.  The size 4 tray was   then pinned in position through the medial third of the tubercle,   drilled, and keel punched.  Trial reduction was now carried with a 3 femur,  4 tibia, a size 7 mm PS insert, and the 32 anatomic patella botton.  The knee was brought to full extension with good flexion stability with the patella   tracking through the trochlea without application of pressure.  Given   all these findings the trial components removed.  Final components were   opened and cement was mixed.  The knee was irrigated with normal saline solution and pulse lavage.  The synovial lining was   then injected with 30 cc of 0.25% Marcaine with epinephrine, 1 cc of Toradol and 30 cc of NS for a total of 61 cc.     Final implants were then cemented onto cleaned and dried cut surfaces of bone with the knee brought to extension with a size 7 mm PS trial insert.      Once the cement had fully cured, excess cement was removed   throughout the knee.  I confirmed that I was satisfied with the range of   motion and stability, and the final size 7 mm PS AOX insert was chosen.  It was   placed into the knee.      The tourniquet had been let down at 30 minutes.  No significant   hemostasis was required.  The extensor mechanism was then reapproximated using #1 Vicryl and #1 Stratafix sutures with the knee   in flexion.  The   remaining wound  was closed with 2-0 Vicryl and running 4-0 Monocryl.   The knee was cleaned, dried, dressed  sterilely using Dermabond and   Aquacel dressing.  The patient was then   brought to recovery room in stable condition, tolerating the procedure   well.   Please note that Physician Assistant, Danae Orleans, PA-C was present for the entirety of the case, and was utilized for pre-operative positioning, peri-operative retractor management, general facilitation of the procedure and for primary wound closure at the end of the case.              Pietro Cassis Alvan Dame, M.D.    08/17/2019 10:12 AM

## 2019-08-17 NOTE — Anesthesia Procedure Notes (Signed)
Anesthesia Regional Block: Adductor canal block   Pre-Anesthetic Checklist: ,, timeout performed, Correct Patient, Correct Site, Correct Laterality, Correct Procedure, Correct Position, site marked, Risks and benefits discussed, pre-op evaluation,  At surgeon's request and post-op pain management  Laterality: Right  Prep: Maximum Sterile Barrier Precautions used, chloraprep       Needles:  Injection technique: Single-shot  Needle Type: Echogenic Stimulator Needle     Needle Length: 9cm  Needle Gauge: 21     Additional Needles:   Procedures:,,,, ultrasound used (permanent image in chart),,,,  Narrative:  Start time: 08/17/2019 9:30 AM End time: 08/17/2019 9:40 AM Injection made incrementally with aspirations every 5 mL.  Performed by: Personally  Anesthesiologist: Roberts Gaudy, MD  Additional Notes: 20 cc 0.75% Ropivacaine injected easily

## 2019-08-17 NOTE — Progress Notes (Signed)
Patient baseline, Given scheduled BP medication. Will continue to monitor

## 2019-08-17 NOTE — Evaluation (Addendum)
Physical Therapy Evaluation Patient Details Name: Veronica Garza MRN: [id number] DOB: 1941-05-01 Today's Date: 08/17/2019   History of Present Illness  Patient is 78 y.o. female s/p Rt TKA on 08/17/19 with PMH significant for HTN, GERD, Lt total shoulder.  Clinical Impression  Veronica Garza is a 78 y.o. female POD 0 s/p Rt TKA. She reports at baseline she was ambulating with [facility] prior to surgery and independent with ADL's. She currently is limited by functional impairments (see PT problem list below) and requires min assist for bed mobility and transfers with RW. Patient was able to take ~ 2 forward/bakcward steps from EOB but due to pain and lightheadedness she was unable to ambulate further. Patient reported resolution of dizziness after returning to supine. Pt educated on exercise for ROM and to facilitate circulation. She will benefit from additional skilled PT to progress towards PLOF. She does not own a RW and will need one to return home safely. Acute PT will follow to provide mobility and stair training in preparation for safe discharge home.     Follow Up Recommendations Follow surgeon's recommendation for DC plan and follow-up therapies    Equipment Recommendations  Rolling walker with 5" wheels(educated pt and spouse on where to obtain shower seat)    Recommendations for Other Services       Precautions / Restrictions Precautions Precautions: Fall Restrictions Weight Bearing Restrictions: No      Mobility  Bed Mobility Overal bed mobility: Needs Assistance Bed Mobility: Supine to Sit;Sit to Supine     Supine to sit: Min assist Sit to supine: Min assist   General bed mobility comments: assist required for Rt LE managment secondary to pain, cues for use of bed rail, and assit to press up to sit EOB  Transfers Overall transfer level: Needs assistance Equipment used: Rolling walker (2 wheeled) Transfers: Sit to/from Stand Sit to Stand: Min assist          General transfer comment: cues for safe hand placement and to limit WB into Rt LE initially due to pain  Ambulation/Gait             General Gait Details: performed 2 steps forward wtih cues for pt to press up on Rw tiwh bil UE's to reduce WB in Rt LE due to pain, pt reported feeling lightheaded after stepping and took 2 steps backward with min assist to sit back on bed. deffered further gait due to pain and dizziness (resovled after laying back down.  Stairs            Wheelchair Mobility    Modified Rankin (Stroke Patients Only)       Balance Overall balance assessment: Needs assistance Sitting-balance support: No upper extremity supported;Feet supported;Feet unsupported Sitting balance-Leahy Scale: Good Sitting balance - Comments: pt able to maintain seated balance without feet supported and no UE support (pt is short and must scoot forward further to touch the floor)   Standing balance support: Bilateral upper extremity supported;During functional activity Standing balance-Leahy Scale: Poor Standing balance comment: pt reliant on UE support for standing balance                Pertinent Vitals/Pain Pain Assessment: 0-10 Pain Score: 4  Pain Location: Rt knee Pain Descriptors / Indicators: Aching;Sore Pain Intervention(s): Limited activity within patient's tolerance;Monitored during session    [place] expects to be discharged to:: Private residence Living Arrangements: Spouse/significant other Available Help at Discharge: Family Type of Home: [employer]  Access: Stairs to enter Entrance Stairs-Rails: None Entrance Stairs-Number of Steps: 2 steps (landing between, last step is tall ~ 10") Home Layout: One level;Laundry or work area in [place]: Kasandra Knudsen - single point      Prior Function Level of Independence: Independent with assistive device(s)         Comments: pt was independent to mobilize with [facility] prior to surgery  due to Rt knee pain, before knee pain became severe was ambulating with no device     Hand Dominance        Extremity/Trunk Assessment   Upper Extremity Assessment Upper Extremity Assessment: Overall WFL for tasks assessed    Lower Extremity Assessment Lower Extremity Assessment: Generalized weakness;RLE deficits/detail RLE Deficits / Details: pt with quad activation and poor SLR test, 3-/5 for Rt LE strength grossly RLE: Unable to fully assess due to pain RLE Coordination: decreased gross motor    Cervical / Trunk Assessment Cervical / Trunk Assessment: Normal  Communication   Communication: No difficulties  Cognition Arousal/Alertness: Awake/alert Behavior During Therapy: WFL for tasks assessed/performed Overall Cognitive Status: Within Functional Limits for tasks assessed               General Comments      Exercises Total Joint Exercises Ankle Circles/Pumps: AROM;Both;10 reps;Supine Quad Sets: AROM;Right;5 reps(2 sets) Heel Slides: Right;5 reps;Supine;AAROM(with gait belt)   Assessment/Plan    PT Assessment Patient needs continued PT services  PT Problem List Decreased strength;Decreased activity tolerance;Decreased mobility;Decreased range of motion;Decreased balance;Decreased knowledge of use of DME;Pain       PT Treatment Interventions DME instruction;Stair training;Therapeutic activities;Balance training;Modalities;Manual techniques;Patient/family education;Neuromuscular re-education;Therapeutic exercise;Functional mobility training;Gait training    PT Goals (Current goals can be found in the Care Plan section)  Acute Rehab PT Goals Patient Stated Goal: none specified by patient, she discussed looking forward to being pain free after 2-4 weeks of therapy PT Goal Formulation: With patient Time For Goal Achievement: 08/24/19 Potential to Achieve Goals: Good    Frequency 7X/week    AM-PAC PT "6 Clicks" Mobility  Outcome Measure Help needed turning  from your back to your side while in a flat bed without using bedrails?: A Little Help needed moving from lying on your back to sitting on the side of a flat bed without using bedrails?: A Little Help needed moving to and from a bed to a chair (including a wheelchair)?: A Little Help needed standing up from a chair using your arms (e.g., wheelchair or bedside chair)?: A Little Help needed to walk in hospital room?: A Lot Help needed climbing 3-5 steps with a railing? : A Lot 6 Click Score: 16    End of Session Equipment Utilized During Treatment: Gait belt Activity Tolerance: Patient tolerated treatment well Patient left: in bed;with call bell/phone within reach;with bed alarm set;with family/visitor present Nurse Communication: Mobility status PT Visit Diagnosis: Unsteadiness on feet (R26.81);Other abnormalities of gait and mobility (R26.89);Muscle weakness (generalized) (M62.81);Pain Pain - Right/Left: Right Pain - part of body: Knee    Time: [id number] PT Time Calculation (min) (ACUTE ONLY): 30 min   Charges:   PT Evaluation $PT Eval Low Complexity: 1 Low PT Treatments $Therapeutic Exercise: 8-22 mins        Kipp Brood, PT, DPT, [facility] Physical Therapist with [facility]  08/17/2019 7:43 PM

## 2019-08-17 NOTE — Anesthesia Procedure Notes (Signed)
Spinal  Patient location during procedure: OR Start time: 08/17/2019 10:10 AM End time: 08/17/2019 10:20 AM Staffing Anesthesiologist: Roberts Gaudy, MD Performed: anesthesiologist  Preanesthetic Checklist Completed: patient identified, site marked, surgical consent, pre-op evaluation, timeout performed, IV checked, risks and benefits discussed and monitors and equipment checked Spinal Block Patient position: sitting Prep: DuraPrep Patient monitoring: heart rate, cardiac monitor, continuous pulse ox and blood pressure Approach: midline Location: L3-4 Injection technique: single-shot Needle Needle type: Sprotte and Tuohy  Needle gauge: 22 G Needle length: 9 cm Assessment Sensory level: T4 Additional Notes 3 passes L3-4 1.6 mg 0.75% Bupivacaine injected easily

## 2019-08-17 NOTE — Care Plan (Signed)
Ortho Bundle Case Management Note  Patient Details  Name: Veronica Garza MRN: [id number] Date of Birth: 1941-05-23  R TKA on 08-17-19 DCP:  Home with spouse.  1 story home with 2 ste.   DME:  RW and 3-in-1 ordered through [place] PT:  [facility].  PT eval scheduled on 08-20-19                    DME Arranged:  3-N-1, Walker rolling DME Agency:  [employer]  HH Arranged:  NA HH Agency:  NA  Additional Comments: Please contact me with any questions of if this plan should need to change.  Marianne Sofia, RN,CCM [facility]  [phone] 08/17/2019, 12:56 PM

## 2019-08-17 NOTE — Plan of Care (Signed)
Plan of care for post op day 0 discussed with patient. All questions answered.   Will continue to monitor patient.

## 2019-08-17 NOTE — Transfer of Care (Signed)
Immediate Anesthesia Transfer of Care Note  Patient: Veronica Garza  Procedure(s) Performed: TOTAL KNEE ARTHROPLASTY (Right Knee)  Patient Location: PACU  Anesthesia Type:MAC combined with regional for post-op pain  Level of Consciousness: awake, drowsy and patient cooperative  Airway & Oxygen Therapy: Patient Spontanous Breathing and Patient connected to face mask oxygen  Post-op Assessment: Report given to RN and Post -op Vital signs reviewed and stable  Post vital signs: stable  Last Vitals:  Vitals Value Taken Time  BP 85/55 08/17/19 1204  Temp    Pulse 75 08/17/19 1206  Resp 16 08/17/19 1206  SpO2 100 % 08/17/19 1206  Vitals shown include unvalidated device data.  Last Pain:  Vitals:   08/17/19 0929  TempSrc:   PainSc: 2       Patients Stated Pain Goal: 4 (58/52/77 8242)  Complications: No apparent anesthesia complications

## 2019-08-18 ENCOUNTER — Encounter (HOSPITAL_COMMUNITY): Payer: Self-pay | Admitting: Orthopedic Surgery

## 2019-08-18 DIAGNOSIS — K219 Gastro-esophageal reflux disease without esophagitis: Secondary | ICD-10-CM | POA: Diagnosis not present

## 2019-08-18 DIAGNOSIS — M1711 Unilateral primary osteoarthritis, right knee: Secondary | ICD-10-CM | POA: Diagnosis not present

## 2019-08-18 DIAGNOSIS — G4733 Obstructive sleep apnea (adult) (pediatric): Secondary | ICD-10-CM | POA: Diagnosis not present

## 2019-08-18 DIAGNOSIS — I10 Essential (primary) hypertension: Secondary | ICD-10-CM | POA: Diagnosis not present

## 2019-08-18 DIAGNOSIS — Z20828 Contact with and (suspected) exposure to other viral communicable diseases: Secondary | ICD-10-CM | POA: Diagnosis not present

## 2019-08-18 LAB — CBC
HCT: 33.3 % — ABNORMAL LOW (ref 36.0–46.0)
Hemoglobin: 11.1 g/dL — ABNORMAL LOW (ref 12.0–15.0)
MCH: 31.7 pg (ref 26.0–34.0)
MCHC: 33.3 g/dL (ref 30.0–36.0)
MCV: 95.1 fL (ref 80.0–100.0)
Platelets: 141 10*3/uL — ABNORMAL LOW (ref 150–400)
RBC: 3.5 MIL/uL — ABNORMAL LOW (ref 3.87–5.11)
RDW: 13.1 % (ref 11.5–15.5)
WBC: 8.9 10*3/uL (ref 4.0–10.5)
nRBC: 0 % (ref 0.0–0.2)

## 2019-08-18 LAB — BASIC METABOLIC PANEL
Anion gap: 8 (ref 5–15)
BUN: 21 mg/dL (ref 8–23)
CO2: 23 mmol/L (ref 22–32)
Calcium: 8.8 mg/dL — ABNORMAL LOW (ref 8.9–10.3)
Chloride: 104 mmol/L (ref 98–111)
Creatinine, Ser: 0.92 mg/dL (ref 0.44–1.00)
GFR calc Af Amer: >60 mL/min (ref >60)
GFR calc non Af Amer: >60 mL/min (ref >60)
Glucose, Bld: 167 mg/dL — ABNORMAL HIGH (ref 70–99)
Potassium: 4.5 mmol/L (ref 3.5–5.1)
Sodium: 135 mmol/L (ref 135–145)

## 2019-08-18 MED ORDER — POLYETHYLENE GLYCOL 3350 17 G PO PACK: 17.0000 g | PACK | Freq: Two times a day (BID) | ORAL | 0 refills | Status: DC

## 2019-08-18 MED ORDER — FERROUS SULFATE 325 (65 FE) MG PO TABS: 325.0000 mg | ORAL_TABLET | Freq: Three times a day (TID) | ORAL | 0 refills | Status: DC

## 2019-08-18 MED ORDER — HYDROCODONE-ACETAMINOPHEN 7.5-325 MG PO TABS: 1.0000 | ORAL_TABLET | ORAL | 0 refills | Status: DC | PRN

## 2019-08-18 MED ORDER — ASPIRIN 81 MG PO CHEW: 81.0000 mg | CHEWABLE_TABLET | Freq: Two times a day (BID) | ORAL | 0 refills | Status: AC

## 2019-08-18 MED ORDER — METHOCARBAMOL 500 MG PO TABS: 500.0000 mg | ORAL_TABLET | Freq: Four times a day (QID) | ORAL | 0 refills | Status: DC | PRN

## 2019-08-18 MED ORDER — DOCUSATE SODIUM 100 MG PO CAPS: 100.0000 mg | ORAL_CAPSULE | Freq: Two times a day (BID) | ORAL | 0 refills | Status: DC

## 2019-08-18 NOTE — Care Management Obs Status (Signed)
[place] NOTIFICATION   Patient Details  Name: Veronica Garza MRN: [id number] Date of Birth: 29-Mar-1941   Medicare Observation Status Notification Given:  Yes    Leeroy Cha, RN 08/18/2019, 10:08 AM

## 2019-08-18 NOTE — Progress Notes (Signed)
Physical Therapy Treatment Patient Details Name: Veronica Garza MRN: [id number] DOB: Aug 09, 1941 Today's Date: 08/18/2019    History of Present Illness Patient is 78 y.o. female s/p Rt TKA on 08/17/19 with PMH significant for HTN, GERD, Lt total shoulder.    PT Comments    Pt  Progressing well; reviewed stairs and HEP with pt and spouse. Pt ready for d/c from PT standpoint  Follow Up Recommendations  Follow surgeon's recommendation for DC plan and follow-up therapies     Equipment Recommendations       Recommendations for Other Services       Precautions / Restrictions Precautions Precautions: Fall;Knee Restrictions Weight Bearing Restrictions: No    Mobility  Bed Mobility Overal bed mobility: Needs Assistance Bed Mobility: Supine to Sit;Sit to Supine     Supine to sit: Supervision Sit to supine: Supervision   General bed mobility comments: instructed in use of leg lifter   Transfers Overall transfer level: Needs assistance Equipment used: Rolling walker (2 wheeled) Transfers: Sit to/from Stand Sit to Stand: Min guard         General transfer comment: cues for hand placement and RLE position  Ambulation/Gait Ambulation/Gait assistance: Min guard;Min assist Gait Distance (Feet): 40 Feet Assistive device: Rolling walker (2 wheeled) Gait Pattern/deviations: Step-to pattern     General Gait Details: cues for sequence and RW position   Stairs Stairs: Yes Stairs assistance: Min guard;Min assist Stair Management: One rail Right;One rail Left;Step to pattern;With cane Number of Stairs: 5(x2) General stair comments: cues for sequence and safe technique; pt and husband able to return demo   Wheelchair Mobility    Modified Rankin (Stroke Patients Only)       Balance                                            Cognition Arousal/Alertness: Awake/alert Behavior During Therapy: WFL for tasks assessed/performed Overall Cognitive  Status: Within Functional Limits for tasks assessed                                        Exercises Total Joint Exercises Ankle Circles/Pumps: AROM;Both;10 reps;Supine Quad Sets: AROM;Both;10 reps Heel Slides: AAROM;Right;10 reps Hip ABduction/ADduction: AROM;Right;10 reps Straight Leg Raises: AROM;Right;10 reps Goniometric ROM: ~ 7 to 65 degrees flexion    General Comments        Pertinent Vitals/Pain Pain Assessment: 0-10 Pain Score: 4  Pain Location: Rt knee Pain Descriptors / Indicators: Aching;Sore Pain Intervention(s): Limited activity within patient's tolerance;Premedicated before session;Monitored during session;Ice applied    Home Living                      Prior Function            PT Goals (current goals can now be found in the care plan section) Acute Rehab PT Goals Patient Stated Goal: none specified by patient, she discussed looking forward to being pain free after 2-4 weeks of therapy PT Goal Formulation: With patient Time For Goal Achievement: 08/24/19 Potential to Achieve Goals: Good Progress towards PT goals: Progressing toward goals    Frequency    7X/week      PT Plan Current plan remains appropriate    Co-evaluation  AM-PAC PT "6 Clicks" Mobility   Outcome Measure  Help needed turning from your back to your side while in a flat bed without using bedrails?: A Little Help needed moving from lying on your back to sitting on the side of a flat bed without using bedrails?: A Little Help needed moving to and from a bed to a chair (including a wheelchair)?: A Little Help needed standing up from a chair using your arms (e.g., wheelchair or bedside chair)?: A Little Help needed to walk in hospital room?: A Little Help needed climbing 3-5 steps with a railing? : A Little 6 Click Score: 18    End of Session Equipment Utilized During Treatment: Gait belt Activity Tolerance: Patient tolerated treatment  well;Patient limited by fatigue Patient left: with call bell/phone within reach;with family/visitor present;in bed;with bed alarm set Nurse Communication: Mobility status PT Visit Diagnosis: Unsteadiness on feet (R26.81);Other abnormalities of gait and mobility (R26.89);Muscle weakness (generalized) (M62.81);Pain Pain - Right/Left: Right Pain - part of body: Knee     Time: [id number] PT Time Calculation (min) (ACUTE ONLY): 28 min  Charges:  $Gait Training: 8-22 mins                     Kenyon Ana, PT  Pager: [phone] Acute Rehab Dept [facility]): [id number]   08/18/2019    [facility] 08/18/2019, 4:01 PM

## 2019-08-18 NOTE — Progress Notes (Addendum)
     Subjective: 1 Day Post-Op Procedure(s) (LRB): TOTAL KNEE ARTHROPLASTY (Right)   Patient reports pain as mild, pain controlled. No reported events throughout the night other than not being able to sleep.  Procedure was described, as well as findings and expectations moving forward. Ready to be discharged home, is she does well with PT.   Patient's anticipated LOS is less than 2 midnights, meeting these requirements: - Lives within 1 hour of care - Has a competent adult at home to recover with post-op recover - NO history of  - Chronic pain requiring opiods  - Diabetes  - Heart attack  - Stroke  - DVT/VTE  - Cardiac arrhythmia  - Respiratory Failure/COPD  - Renal failure  - Anemia  - Advanced Liver disease     Objective:   VITALS:   Vitals:   08/18/19 0124 08/18/19 0516  BP: (!) 143/78 118/64  Pulse: 84 72  Resp: 18 14  Temp: 97.8 F (36.6 C) 97.6 F (36.4 C)  SpO2: 99% 99%    Dorsiflexion/Plantar flexion intact Incision: dressing C/D/I No cellulitis present Compartment soft  LABS Recent Labs    08/18/19 0232  HGB 11.1*  HCT 33.3*  WBC 8.9  PLT 141*    Recent Labs    08/17/19 0708 08/18/19 0232  NA 138 135  K 4.2 4.5  BUN 21 21  CREATININE 1.01* 0.92  GLUCOSE 88 167*     Assessment/Plan: 1 Day Post-Op Procedure(s) (LRB): TOTAL KNEE ARTHROPLASTY (Right) Foley cath d/c'ed Advance diet Up with therapy D/C IV fluids Discharge home Follow up in 2 weeks at [facility] ([place]). Follow up with OLIN,Aino Heckert D in 2 weeks.  Contact information:  [facility]) [address], [address] [place]. Thoren Hosang   PAC  08/18/2019, 8:25 AM

## 2019-08-18 NOTE — Progress Notes (Signed)
Physical Therapy Treatment Patient Details Name: Veronica Garza MRN: [id number] DOB: 1941-01-06 Today's Date: 08/18/2019    History of Present Illness Patient is 78 y.o. female s/p Rt TKA on 08/17/19 with PMH significant for HTN, GERD, Lt total shoulder.    PT Comments    Pt progressing slowly, limited by fatigue, will see again im pm, hopefully able to d/c after second session  Follow Up Recommendations  Follow surgeon's recommendation for DC plan and follow-up therapies     Equipment Recommendations       Recommendations for Other Services       Precautions / Restrictions Precautions Precautions: Fall;Knee Restrictions Weight Bearing Restrictions: No    Mobility  Bed Mobility Overal bed mobility: Needs Assistance Bed Mobility: Supine to Sit     Supine to sit: Supervision;Min guard     General bed mobility comments: assist required for Rt LE managment secondary to pain, cues for use of bed rail, and assit to press up to sit EOB  Transfers Overall transfer level: Needs assistance Equipment used: Rolling walker (2 wheeled) Transfers: Sit to/from Stand Sit to Stand: Min assist         General transfer comment: cues for hand placement and RLE position  Ambulation/Gait Ambulation/Gait assistance: Min guard;Min assist Gait Distance (Feet): 50 Feet(15' ) Assistive device: Rolling walker (2 wheeled) Gait Pattern/deviations: Step-to pattern     General Gait Details: cues for sequence and RW position   Stairs             Wheelchair Mobility    Modified Rankin (Stroke Patients Only)       Balance                                            Cognition Arousal/Alertness: Awake/alert Behavior During Therapy: WFL for tasks assessed/performed Overall Cognitive Status: Within Functional Limits for tasks assessed                                        Exercises Total Joint Exercises Ankle Circles/Pumps:  AROM;Both;10 reps;Supine    General Comments        Pertinent Vitals/Pain Pain Assessment: 0-10 Pain Score: 3  Pain Location: Rt knee Pain Descriptors / Indicators: Aching;Sore Pain Intervention(s): Limited activity within patient's tolerance;Monitored during session;Ice applied    Home Living                      Prior Function            PT Goals (current goals can now be found in the care plan section) Acute Rehab PT Goals Patient Stated Goal: none specified by patient, she discussed looking forward to being pain free after 2-4 weeks of therapy PT Goal Formulation: With patient Time For Goal Achievement: 08/24/19 Potential to Achieve Goals: Good Progress towards PT goals: Progressing toward goals    Frequency    7X/week      PT Plan Current plan remains appropriate    Co-evaluation              AM-PAC PT "6 Clicks" Mobility   Outcome Measure  Help needed turning from your back to your side while in a flat bed without using bedrails?: A Little Help needed moving from lying on your  back to sitting on the side of a flat bed without using bedrails?: A Little Help needed moving to and from a bed to a chair (including a wheelchair)?: A Little Help needed standing up from a chair using your arms (e.g., wheelchair or bedside chair)?: A Little Help needed to walk in hospital room?: A Lot Help needed climbing 3-5 steps with a railing? : A Lot 6 Click Score: 16    End of Session Equipment Utilized During Treatment: Gait belt Activity Tolerance: Patient tolerated treatment well;Patient limited by fatigue Patient left: in chair;with call bell/phone within reach;with chair alarm set;with family/visitor present Nurse Communication: Mobility status PT Visit Diagnosis: Unsteadiness on feet (R26.81);Other abnormalities of gait and mobility (R26.89);Muscle weakness (generalized) (M62.81);Pain Pain - Right/Left: Right Pain - part of body: Knee     Time:  [id number] PT Time Calculation (min) (ACUTE ONLY): 19 min  Charges:  $Gait Training: 23-37 mins                     Kenyon Ana, PT  Pager: [phone] Acute Rehab Dept [facility]): [phone]   08/18/2019    [facility] 08/18/2019, 12:24 PM

## 2019-08-18 NOTE — TOC Transition Note (Signed)
Transition of Care [facility]) - CM/SW Discharge Note   Patient Details  Name: RIKA BEAM MRN: [id number] Date of Birth: 1940-12-15  Transition of Care [facility]) CM/SW Contact:  Leeroy Cha, RN Phone Number: 08/18/2019, 10:18 AM   Clinical Narrative:    [facility]   Final next level of care: OP Rehab Barriers to Discharge: No Barriers Identified   Patient Goals and CMS Choice Patient states their goals for this hospitalization and ongoing recovery are:: to go home CMS Medicare.gov Compare Post Acute Care list provided to:: Patient    Discharge Placement                       Discharge Plan and Services                DME Arranged: 3-N-1, Walker rolling DME Agency: [employer]       HH Arranged: NA HH Agency: NA        Social Determinants of Health (SDOH) Interventions     Readmission Risk Interventions No flowsheet data found.

## 2019-08-20 ENCOUNTER — Ambulatory Visit: Payer: Medicare Other | Attending: Orthopedic Surgery | Admitting: Physical Therapy

## 2019-08-20 ENCOUNTER — Telehealth: Payer: Self-pay | Admitting: *Deleted

## 2019-08-20 ENCOUNTER — Other Ambulatory Visit: Payer: Self-pay

## 2019-08-20 DIAGNOSIS — R600 Localized edema: Secondary | ICD-10-CM | POA: Diagnosis not present

## 2019-08-20 DIAGNOSIS — M25661 Stiffness of right knee, not elsewhere classified: Secondary | ICD-10-CM | POA: Diagnosis not present

## 2019-08-20 DIAGNOSIS — G8929 Other chronic pain: Secondary | ICD-10-CM | POA: Diagnosis not present

## 2019-08-20 DIAGNOSIS — R262 Difficulty in walking, not elsewhere classified: Secondary | ICD-10-CM | POA: Diagnosis not present

## 2019-08-20 DIAGNOSIS — M6281 Muscle weakness (generalized): Secondary | ICD-10-CM | POA: Insufficient documentation

## 2019-08-20 DIAGNOSIS — M25561 Pain in right knee: Secondary | ICD-10-CM | POA: Insufficient documentation

## 2019-08-20 NOTE — Therapy (Signed)
[facility] [place], [place], [address] Phone: [phone]   Fax:  [phone]  Physical Therapy Evaluation  Patient Details  Name: Veronica Garza MRN: [id number] Date of Birth: [birth year] Referring Provider (PT): Paralee Cancel MD   Encounter Date: 08/20/2019  PT End of Session - 08/20/19 1440    Visit Number  1    Number of Visits  16    Date for PT Re-Evaluation  11/18/19    Authorization Type  FOTO AT LEAST EVERY 5TH VISIT.  PROGRESS NOTE AT 10TH VISIT.  KX MODIFIER AFTER 15 VISITS.    PT Start Time  1034    PT Stop Time  1125    PT Time Calculation (min)  51 min    Activity Tolerance  Patient tolerated treatment well       Past Medical History:  Diagnosis Date  . Anemia   . Arthritis   . BCC (basal cell carcinoma) 07/19/2014   Right cheek  . Cataract   . Complication of anesthesia    takes patient a long time to wake up  . GERD (gastroesophageal reflux disease)   . Hx of shoulder replacement    lt  . Hypertension   . OSA (obstructive sleep apnea)    use CPAP  . Pneumonia    History of  . Urinary incontinence     Past Surgical History:  Procedure Laterality Date  . ABDOMINAL HYSTERECTOMY    . EYE SURGERY     bilateral cataract extraction  . FINGER SURGERY     cyst left middle finger  . JOINT REPLACEMENT     left shoulder  . NASAL SINUS SURGERY    . SHOULDER ARTHROSCOPY     Left  . TOTAL KNEE ARTHROPLASTY Right 08/17/2019   Procedure: TOTAL KNEE ARTHROPLASTY;  Surgeon: Paralee Cancel, MD;  Location: [facility];  Service: Orthopedics;  Laterality: Right;  70 mins  . TOTAL SHOULDER ARTHROPLASTY  10/10/2011   Procedure: TOTAL SHOULDER ARTHROPLASTY;  Surgeon: Metta Clines Supple;  Location: [facility];  Service: Orthopedics;  Laterality: Right;  . UPPER GASTROINTESTINAL ENDOSCOPY    . Vaginal Sling      There were no vitals filed for this visit.   Subjective Assessment - 08/20/19 1445    Subjective  COVID-19  screen performed prior to patient entering clinic.  The patient underwent a right total knee replacement on 08/17/19.  She states she is doing okay today and her husband has been helping her with her HEP.  Her resting pain-level is a 6/10 and higher with range of motion activities.  She is currently walking safely with a FWW.    Pertinent History  OSA, TSA, OA, HTN, Anemia.    Patient Stated Goals  Get back to normal life.    Currently in Pain?  Yes    Pain Score  6     Pain Location  Knee    Pain Descriptors / Indicators  Aching;Stabbing;Throbbing    Pain Type  Surgical pain    Pain Onset  In the past 7 days    Pain Frequency  Constant    Aggravating Factors   Movement.    Pain Relieving Factors  Rest.         [facility] PT Assessment - 08/20/19 0001      Assessment   Medical Diagnosis  Right total knee replacement.    Referring Provider (PT)  Paralee Cancel MD    Onset Date/Surgical Date  --  08/17/19 (surgery date).     Precautions   Precautions  --   No ultrasound.     Restrictions   Weight Bearing Restrictions  No      Balance Screen   Has the patient fallen in the past 6 months  No    Has the patient had a decrease in activity level because of a fear of falling?   Yes    Is the patient reluctant to leave their home because of a fear of falling?   No      Home Environment   Living Environment  Private residence      Prior Function   Level of Independence  Independent      Observation/Other Assessments   Observations  Aquacel intact over right knee.    Focus on Therapeutic Outcomes (FOTO)   83% limitation.      Observation/Other Assessments-Edema    Edema  Circumferential      Circumferential Edema   Circumferential - Right  RT 2 cms > LT.      ROM / Strength   AROM / PROM / Strength  AROM;Strength      AROM   Overall AROM Comments  Right knee AROM:  -18 to 60 degrees.      Strength   Overall Strength Comments  Right hip= 4-/5 and knee= 4/5.      Palpation    Palpation comment  Tender to palpation diffusely around right anterior knee.      Bed Mobility   Bed Mobility  --   Manual assist to right LE.     Ambulation/Gait   Gait Comments  Slow, cautious and antalgic gait pattern with a FWW.                Objective measurements completed on examination: See above findings.      [facility] Adult PT Treatment/Exercise - 08/20/19 0001      Exercises   Exercises  Knee/Hip      Knee/Hip Exercises: Aerobic   Nustep  Level 1 x 8 minutes.      Modalities   Modalities  Vasopneumatic      Vasopneumatic   Number Minutes Vasopneumatic   15 minutes    Vasopnuematic Location   --   Right knee.   Vasopneumatic Pressure  Low               PT Short Term Goals - 08/20/19 1502      PT SHORT TERM GOAL #1   Title  STG's=LTG's.        PT Long Term Goals - 08/20/19 1504      PT LONG TERM GOAL #1   Title  Independent with a HEP.    Time  4    Period  Weeks    Status  New      PT LONG TERM GOAL #2   Title  Full right active knee extension in order to normalize gait.    Time  4    Period  Weeks    Status  New      PT LONG TERM GOAL #3   Title  Active right knee flexion to 115 degrees+ so the patient can perform functional tasks and do so with pain not > 2-3/10.    Time  4    Period  Weeks    Status  New      PT LONG TERM GOAL #4   Title  Increase right knee strength to a  solid 4+/5 to provide good stability for accomplishment of functional activities.    Time  4    Period  Weeks    Status  New      PT LONG TERM GOAL #5   Title  Perform a reciprocating stair gait with one railing with pain not > 2-3/10.    Time  4    Period  Weeks    Status  New             Plan - 08/20/19 1457    Clinical Impression Statement  The patient presents to OPPT s/p 3 days right total knee replacement.  She is currently quite limited into both right knee flexion and extension.  Her husband is helping her with her HEP.  She is  walking with a FWW with antalgia.  She is tender to palpation diffusely around her right anterior knee.  She has a relatively minimal amount of knee currently.  Patient will benefit from skilled physical therapy intervention to address deficits and pain.    Personal Factors and Comorbidities  Comorbidity 1;Comorbidity 2    Comorbidities  OSA, TSA, OA, HTN, Anemia.    Examination-Activity Limitations  Other;Transfers;Locomotion Level;Stairs;Squat    Examination-Participation Restrictions  Other    Stability/Clinical Decision Making  Stable/Uncomplicated    Clinical Decision Making  Low    Rehab Potential  Good    PT Frequency  3x / week    PT Duration  4 weeks    PT Treatment/Interventions  ADLs/Self Care Home Management;Cryotherapy;Electrical Stimulation;Gait training;Stair training;Functional mobility training;Therapeutic activities;Therapeutic exercise;Neuromuscular re-education;Manual techniques;Patient/family education;Passive range of motion;Vasopneumatic Device    PT Next Visit Plan  Progress into TKA protocol.  Vasopneumatic and electrical stimulation.    Consulted and Agree with Plan of Care  Patient       Patient will benefit from skilled therapeutic intervention in order to improve the following deficits and impairments:  Pain, Decreased activity tolerance, Decreased strength, Increased edema, Decreased range of motion  Visit Diagnosis: Chronic pain of right knee - Plan: PT plan of care cert/re-cert  Stiffness of right knee, not elsewhere classified - Plan: PT plan of care cert/re-cert  Localized edema - Plan: PT plan of care cert/re-cert  Muscle weakness (generalized) - Plan: PT plan of care cert/re-cert     Problem List Patient Active Problem List   Diagnosis Date Noted  . S/P left TKA 08/17/2019  . Status post total right knee replacement 08/17/2019  . OAB (overactive bladder) 06/22/2019  . ILD (interstitial lung disease) ([place]) 12/15/2018  . Enlarged heart 06/16/2018   . Morbid obesity ([place]) 10/17/2017  . 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 Hot flashes, menopausal 12/31/2016  . DDD (degenerative disc disease), lumbar 11/06/2015  . Right-sided low back pain without sciatica 06/21/2015  . Diastolic dysfunction without heart failure 11/13/2014  . Other abnormal glucose 05/27/2013  . Osteopenia of the elderly 05/27/2013  . Hyperlipidemia LDL goal <130 09/04/2012  . Esophageal stricture 09/12/2011  . Visit for screening mammogram 08/08/2011  . Routine general medical examination at a health care facility 08/08/2011  . Essential hypertension, benign 01/14/2011  . URINARY INCONTINENCE 12/14/2008  . OA (osteoarthritis) 07/21/2008  . Obstructive sleep apnea 10/14/2007  . GERD 10/14/2007    Takeo Harts, [place] MPT 08/20/2019, 3:09 PM  [facility] [address] [place], [place], [address] Phone: [phone]   Fax:  [phone]  Name: Veronica Garza MRN: [id number] Date of Birth: [birth year]

## 2019-08-20 NOTE — Telephone Encounter (Signed)
Pt was on TCM report admitted 08/17/19 for right total knee arthroplasty due to ongoing knee pain. Pt tolerated procedure well and was D/C 08/18/19. Pt will follow=up w/surgeon in 2 weeks.Marland KitchenJohny Chess

## 2019-08-23 ENCOUNTER — Ambulatory Visit: Payer: Medicare Other | Admitting: Physical Therapy

## 2019-08-23 ENCOUNTER — Other Ambulatory Visit: Payer: Self-pay

## 2019-08-23 DIAGNOSIS — R600 Localized edema: Secondary | ICD-10-CM | POA: Diagnosis not present

## 2019-08-23 DIAGNOSIS — M25561 Pain in right knee: Secondary | ICD-10-CM | POA: Diagnosis not present

## 2019-08-23 DIAGNOSIS — M25661 Stiffness of right knee, not elsewhere classified: Secondary | ICD-10-CM

## 2019-08-23 DIAGNOSIS — M6281 Muscle weakness (generalized): Secondary | ICD-10-CM | POA: Diagnosis not present

## 2019-08-23 DIAGNOSIS — R262 Difficulty in walking, not elsewhere classified: Secondary | ICD-10-CM | POA: Diagnosis not present

## 2019-08-23 DIAGNOSIS — G8929 Other chronic pain: Secondary | ICD-10-CM

## 2019-08-23 NOTE — Therapy (Signed)
[facility] [place], [place], [address] Phone: [phone]   Fax:  [phone]  Physical Therapy Treatment  Patient Details  Name: Veronica Garza MRN: [id number] Date of Birth: [birth year] Referring Provider (PT): Paralee Cancel MD   Encounter Date: 08/23/2019  PT End of Session - 08/23/19 1109    Visit Number  2    Number of Visits  16    Date for PT Re-Evaluation  11/18/19    Authorization Type  FOTO AT LEAST EVERY 5TH VISIT.  PROGRESS NOTE AT 10TH VISIT.  KX MODIFIER AFTER 15 VISITS.    PT Start Time  1030    PT Stop Time  1124    PT Time Calculation (min)  54 min    Activity Tolerance  Patient tolerated treatment well    Behavior During Therapy  WFL for tasks assessed/performed       Past Medical History:  Diagnosis Date  . Anemia   . Arthritis   . BCC (basal cell carcinoma) 07/19/2014   Right cheek  . Cataract   . Complication of anesthesia    takes patient a long time to wake up  . GERD (gastroesophageal reflux disease)   . Hx of shoulder replacement    lt  . Hypertension   . OSA (obstructive sleep apnea)    use CPAP  . Pneumonia    History of  . Urinary incontinence     Past Surgical History:  Procedure Laterality Date  . ABDOMINAL HYSTERECTOMY    . EYE SURGERY     bilateral cataract extraction  . FINGER SURGERY     cyst left middle finger  . JOINT REPLACEMENT     left shoulder  . NASAL SINUS SURGERY    . SHOULDER ARTHROSCOPY     Left  . TOTAL KNEE ARTHROPLASTY Right 08/17/2019   Procedure: TOTAL KNEE ARTHROPLASTY;  Surgeon: Paralee Cancel, MD;  Location: [facility];  Service: Orthopedics;  Laterality: Right;  70 mins  . TOTAL SHOULDER ARTHROPLASTY  10/10/2011   Procedure: TOTAL SHOULDER ARTHROPLASTY;  Surgeon: Metta Clines Supple;  Location: [facility];  Service: Orthopedics;  Laterality: Right;  . UPPER GASTROINTESTINAL ENDOSCOPY    . Vaginal Sling      There were no vitals filed for this  visit.  Subjective Assessment - 08/23/19 1033    Subjective  COVID-19 screen performed prior to patient entering clinic. Patient had a lot of pain this AM yet better after taking meds    Pertinent History  OSA, TSA, OA, HTN, Anemia.    Patient Stated Goals  Get back to normal life.    Currently in Pain?  Yes    Pain Score  5     Pain Location  Knee    Pain Orientation  Right    Pain Descriptors / Indicators  Discomfort;Aching    Pain Type  Surgical pain    Pain Onset  In the past 7 days    Pain Frequency  Constant    Aggravating Factors   movement    Pain Relieving Factors  at rest                       [facility] Adult PT Treatment/Exercise - 08/23/19 0001      Knee/Hip Exercises: Aerobic   Nustep  L1 x 71min UE/LE, adjusted for ROM      Knee/Hip Exercises: Seated   Long Arc Quad  Strengthening;Right;1 set;10 reps  Knee/Hip Exercises: Supine   Quad Sets  Strengthening;Right;20 reps    Short Arc Quad Sets  Strengthening;Right;10 reps    Heel Slides  AROM;Right;10 reps      Vasopneumatic   Number Minutes Vasopneumatic   15 minutes    Vasopnuematic Location   Knee    Vasopneumatic Pressure  Low    Vasopneumatic Temperature   34      Manual Therapy   Manual Therapy  Passive ROM    Passive ROM  manual gentle PROM for right knee flexion then ext with low load holds to improve mobility               PT Short Term Goals - 08/20/19 1502      PT SHORT TERM GOAL #1   Title  STG's=LTG's.        PT Long Term Goals - 08/20/19 1504      PT LONG TERM GOAL #1   Title  Independent with a HEP.    Time  4    Period  Weeks    Status  New      PT LONG TERM GOAL #2   Title  Full right active knee extension in order to normalize gait.    Time  4    Period  Weeks    Status  New      PT LONG TERM GOAL #3   Title  Active right knee flexion to 115 degrees+ so the patient can perform functional tasks and do so with pain not > 2-3/10.    Time  4    Period   Weeks    Status  New      PT LONG TERM GOAL #4   Title  Increase right knee strength to a solid 4+/5 to provide good stability for accomplishment of functional activities.    Time  4    Period  Weeks    Status  New      PT LONG TERM GOAL #5   Title  Perform a reciprocating stair gait with one railing with pain not > 2-3/10.    Time  4    Period  Weeks    Status  New            Plan - 08/23/19 1110    Clinical Impression Statement  Patient tolerated treatment well today. Patient able to progress with low level strengthening today with some difficulty with SLR and unable to lift without assist. Patient ROM is WNL at this time. Educated patient and husband on gentle ROM at home for flexion and ext to improve mobility. Goals ongoing.    Personal Factors and Comorbidities  Comorbidity 1;Comorbidity 2    Comorbidities  OSA, TSA, OA, HTN, Anemia.    Examination-Activity Limitations  Other;Transfers;Locomotion Level;Stairs;Squat    Examination-Participation Restrictions  Other    Stability/Clinical Decision Making  Stable/Uncomplicated    Rehab Potential  Good    PT Frequency  3x / week    PT Duration  4 weeks    PT Treatment/Interventions  ADLs/Self Care Home Management;Cryotherapy;Electrical Stimulation;Gait training;Stair training;Functional mobility training;Therapeutic activities;Therapeutic exercise;Neuromuscular re-education;Manual techniques;Patient/family education;Passive range of motion;Vasopneumatic Device    PT Next Visit Plan  Progress into TKA protocol.  Vasopneumatic and electrical stimulation.    Consulted and Agree with Plan of Care  Patient       Patient will benefit from skilled therapeutic intervention in order to improve the following deficits and impairments:  Pain, Decreased activity tolerance, Decreased  strength, Increased edema, Decreased range of motion  Visit Diagnosis: Chronic pain of right knee  Stiffness of right knee, not elsewhere  classified  Localized edema  Muscle weakness (generalized)     Problem List Patient Active Problem List   Diagnosis Date Noted  . S/P left TKA 08/17/2019  . Status post total right knee replacement 08/17/2019  . OAB (overactive bladder) 06/22/2019  . ILD (interstitial lung disease) ([place]) 12/15/2018  . Enlarged heart 06/16/2018  . Morbid obesity ([place]) 10/17/2017  . 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 Hot flashes, menopausal 12/31/2016  . DDD (degenerative disc disease), lumbar 11/06/2015  . Right-sided low back pain without sciatica 06/21/2015  . Diastolic dysfunction without heart failure 11/13/2014  . Other abnormal glucose 05/27/2013  . Osteopenia of the elderly 05/27/2013  . Hyperlipidemia LDL goal <130 09/04/2012  . Esophageal stricture 09/12/2011  . Visit for screening mammogram 08/08/2011  . Routine general medical examination at a health care facility 08/08/2011  . Essential hypertension, benign 01/14/2011  . URINARY INCONTINENCE 12/14/2008  . OA (osteoarthritis) 07/21/2008  . Obstructive sleep apnea 10/14/2007  . GERD 10/14/2007    Veronica Garza, Veronica Garza 08/23/2019, 11:25 AM  [facility] [place], [place], [address] Phone: [phone]   Fax:  [phone]  Name: Veronica Garza MRN: [id number] Date of Birth: 1940-12-03

## 2019-08-24 NOTE — Discharge Summary (Signed)
Physician Discharge Summary  Patient ID: Veronica Garza MRN: [id number] DOB/AGE: 77-May-1942 77 y.o.  Admit date: 08/17/2019 Discharge date: 08/18/2019   Procedures:  Procedure(s) (LRB): TOTAL KNEE ARTHROPLASTY (Right)  Attending Physician:  Dr. Paralee Cancel   Admission Diagnoses:   Right knee primary OA / pain  Discharge Diagnoses:  Principal Problem:   S/P left TKA Active Problems:   Status post total right knee replacement  Past Medical History:  Diagnosis Date  . Anemia   . Arthritis   . BCC (basal cell carcinoma) 07/19/2014   Right cheek  . Cataract   . Complication of anesthesia    takes patient a long time to wake up  . GERD (gastroesophageal reflux disease)   . Hx of shoulder replacement    lt  . Hypertension   . OSA (obstructive sleep apnea)    use CPAP  . Pneumonia    History of  . Urinary incontinence     HPI:    Veronica Garza, 78 y.o. female, has a history of pain and functional disability in the right knee due to arthritis and has failed non-surgical conservative treatments for greater than 12 weeks to includeNSAID's and/or analgesics and activity modification.  Onset of symptoms was gradual, starting <1 years ago with rapidlly worsening course since that time. The patient noted no past surgery on the right knee(s).  Patient currently rates pain in the right knee(s) at 6 out of 10 with activity. Patient has night pain, worsening of pain with activity and weight bearing, pain that interferes with activities of daily living, pain with passive range of motion, crepitus and joint swelling.  Patient has evidence of periarticular osteophytes and joint space narrowing by imaging studies.  There is no active infection.  Risks, benefits and expectations were discussed with the patient.  Risks including but not limited to the risk of anesthesia, blood clots, nerve damage, blood vessel damage, failure of the prosthesis, infection and up to and including death.  Patient  understand the risks, benefits and expectations and wishes to proceed with surgery.   PCP: Janith Lima, MD   Discharged Condition: good  Hospital Course:  Patient underwent the above stated procedure on 08/17/2019. Patient tolerated the procedure well and brought to the recovery room in good condition and subsequently to the floor.  POD #1 BP: 118/64 ; Pulse: 72 ; Temp: 97.6 F (36.4 C) ; Resp: 14 Patient reports pain as mild, pain controlled. No reported events throughout the night other than not being able to sleep.  Procedure was described, as well as findings and expectations moving forward. Ready to be discharged home. Dorsiflexion/plantar flexion intact, incision: dressing C/D/I, no cellulitis present and compartment soft.   LABS  Basename    HGB     11.1  HCT     33.3    Discharge Exam: General appearance: alert, cooperative and no distress Extremities: Homans sign is negative, no sign of DVT, no edema, redness or tenderness in the calves or thighs and no ulcers, gangrene or trophic changes  Disposition:  Home with follow up in 2 weeks   Follow-up Information    Danae Orleans, PA-C. Go on 09/01/2019.   Specialty: Orthopedic Surgery Why: You are scheduled for post-operative appointment on 09-01-19 at 2:15 pm.  Contact information: [address]  [place] [address] [phone]        [facility]. Go on 08/20/2019.   Why: You are scheduled for a  physical therapy appointment at [facility] on 08-20-19          Discharge Instructions    Call MD / Call 911   Complete by: As directed    If you experience chest pain or shortness of breath, CALL 911 and be transported to the hospital emergency room.  If you develope a fever above 101 F, pus (white drainage) or increased drainage or redness at the wound, or calf pain, call your surgeon's office.   Change dressing   Complete by: As directed    Maintain  surgical dressing until follow up in the clinic. If the edges start to pull up, may reinforce with tape. If the dressing is no longer working, may remove and cover with gauze and tape, but must keep the area dry and clean.  Call with any questions or concerns.   Constipation Prevention   Complete by: As directed    Drink plenty of fluids.  Prune juice may be helpful.  You may use a stool softener, such as Colace (over the counter) 100 mg twice a day.  Use MiraLax (over the counter) for constipation as needed.   Diet - low sodium heart healthy   Complete by: As directed    Discharge instructions   Complete by: As directed    Maintain surgical dressing until follow up in the clinic. If the edges start to pull up, may reinforce with tape. If the dressing is no longer working, may remove and cover with gauze and tape, but must keep the area dry and clean.  Follow up in 2 weeks at [facility]. Call with any questions or concerns.   Increase activity slowly as tolerated   Complete by: As directed    Weight bearing as tolerated with assist device (walker, cane, etc) as directed, use it as long as suggested by your surgeon or therapist, typically at least 4-6 weeks.   TED hose   Complete by: As directed    Use stockings (TED hose) for 2 weeks on both leg(s).  You may remove them at night for sleeping.      Allergies as of 08/18/2019      Reactions   Iohexol Hives    Code: HIVES, Desc: ? Contrast reaction from CT prior to 2000.   Naproxen Itching, Rash   Neomycin-bacitracin Zn-polymyx Itching, Rash   REACTION: redness   Sulfonamide Derivatives Itching, Rash   REACTION: rash/tingling      Medication List    STOP taking these medications   acetaminophen 500 MG tablet Commonly known as: TYLENOL   diclofenac sodium 1 % Gel Commonly known as: VOLTAREN   meloxicam 15 MG tablet Commonly known as: MOBIC     TAKE these medications   aspirin 81 MG chewable tablet Commonly known  as: Aspirin Childrens Chew 1 tablet (81 mg total) by mouth 2 (two) times daily. Take for 4 weeks, then resume regular dose.   Caltrate 600+D 600-400 MG-UNIT tablet Generic drug: Calcium Carbonate-Vitamin D Take 1 tablet by mouth 2 (two) times daily.   docusate sodium 100 MG capsule Commonly known as: Colace Take 1 capsule (100 mg total) by mouth 2 (two) times daily.   ferrous sulfate 325 (65 FE) MG tablet Commonly known as: FerrouSul Take 1 tablet (325 mg total) by mouth 3 (three) times daily with meals for 14 days.   HYDROcodone-acetaminophen 7.5-325 MG tablet Commonly known as: Norco Take 1-2 tablets by mouth every 4 (four) hours as needed for moderate  pain.   irbesartan 300 MG tablet Commonly known as: AVAPRO Take 1 tablet (300 mg total) by mouth daily.   methocarbamol 500 MG tablet Commonly known as: Robaxin Take 1 tablet (500 mg total) by mouth every 6 (six) hours as needed for muscle spasms.   multivitamin with minerals tablet Take 1 tablet by mouth daily.   PRESERVISION AREDS 2 PO Take 1 capsule by mouth 2 (two) times daily.   omeprazole 40 MG capsule Commonly known as: PRILOSEC TAKE (1) CAPSULE DAILY What changed: See the new instructions.   PARoxetine 10 MG tablet Commonly known as: PAXIL TAKE 1 TABLET DAILY   polyethylene glycol 17 g packet Commonly known as: MIRALAX / GLYCOLAX Take 17 g by mouth 2 (two) times daily.   rosuvastatin 20 MG tablet Commonly known as: CRESTOR Take 1 tablet (20 mg total) by mouth daily.   Systane Balance 0.6 % Soln Generic drug: Propylene Glycol Place 1 drop into both eyes 3 (three) times daily as needed (dry eyes).   tolterodine 2 MG 24 hr capsule Commonly known as: Detrol LA Take 1 capsule (2 mg total) by mouth daily.   TURMERIC PO Take 1,000 mg by mouth daily. Take one by mouth daily            Discharge Care Instructions  (From admission, onward)         Start     Ordered   08/18/19 0000  Change dressing     Comments: Maintain surgical dressing until follow up in the clinic. If the edges start to pull up, may reinforce with tape. If the dressing is no longer working, may remove and cover with gauze and tape, but must keep the area dry and clean.  Call with any questions or concerns.   08/18/19 [id number]           Signed: [place] Pugh. Christian Treadway   PA-C  08/24/2019, 9:37 AM

## 2019-08-25 ENCOUNTER — Ambulatory Visit: Payer: Medicare Other | Admitting: Physical Therapy

## 2019-08-25 ENCOUNTER — Other Ambulatory Visit: Payer: Self-pay

## 2019-08-25 DIAGNOSIS — M25661 Stiffness of right knee, not elsewhere classified: Secondary | ICD-10-CM

## 2019-08-25 DIAGNOSIS — R262 Difficulty in walking, not elsewhere classified: Secondary | ICD-10-CM | POA: Diagnosis not present

## 2019-08-25 DIAGNOSIS — M25561 Pain in right knee: Secondary | ICD-10-CM | POA: Diagnosis not present

## 2019-08-25 DIAGNOSIS — M6281 Muscle weakness (generalized): Secondary | ICD-10-CM | POA: Diagnosis not present

## 2019-08-25 DIAGNOSIS — R600 Localized edema: Secondary | ICD-10-CM | POA: Diagnosis not present

## 2019-08-25 DIAGNOSIS — G8929 Other chronic pain: Secondary | ICD-10-CM

## 2019-08-25 NOTE — Therapy (Signed)
[facility] [place], [place], [address] Phone: [phone]   Fax:  [phone]  Physical Therapy Treatment  Patient Details  Name: Veronica Garza MRN: [id number] Date of Birth: Jun 30, 1941 Referring Provider (PT): Paralee Cancel MD   Encounter Date: 08/25/2019  PT End of Session - 08/25/19 1154    Visit Number  3    Number of Visits  16    Date for PT Re-Evaluation  11/18/19    Authorization Type  FOTO AT LEAST EVERY 5TH VISIT.  PROGRESS NOTE AT 10TH VISIT.  KX MODIFIER AFTER 15 VISITS.    PT Start Time  1034    PT Stop Time  1124    PT Time Calculation (min)  50 min    Activity Tolerance  Patient tolerated treatment well    Behavior During Therapy  WFL for tasks assessed/performed       Past Medical History:  Diagnosis Date  . Anemia   . Arthritis   . BCC (basal cell carcinoma) 07/19/2014   Right cheek  . Cataract   . Complication of anesthesia    takes patient a long time to wake up  . GERD (gastroesophageal reflux disease)   . Hx of shoulder replacement    lt  . Hypertension   . OSA (obstructive sleep apnea)    use CPAP  . Pneumonia    History of  . Urinary incontinence     Past Surgical History:  Procedure Laterality Date  . ABDOMINAL HYSTERECTOMY    . EYE SURGERY     bilateral cataract extraction  . FINGER SURGERY     cyst left middle finger  . JOINT REPLACEMENT     left shoulder  . NASAL SINUS SURGERY    . SHOULDER ARTHROSCOPY     Left  . TOTAL KNEE ARTHROPLASTY Right 08/17/2019   Procedure: TOTAL KNEE ARTHROPLASTY;  Surgeon: Paralee Cancel, MD;  Location: [facility];  Service: Orthopedics;  Laterality: Right;  70 mins  . TOTAL SHOULDER ARTHROPLASTY  10/10/2011   Procedure: TOTAL SHOULDER ARTHROPLASTY;  Surgeon: Metta Clines Supple;  Location: [place];  Service: Orthopedics;  Laterality: Right;  . UPPER GASTROINTESTINAL ENDOSCOPY    . Vaginal Sling      There were no vitals filed for this  visit.  Subjective Assessment - 08/25/19 1140    Subjective  COVID-19 screen performed prior to patient entering clinic.  Ni new complaints.    Pertinent History  OSA, TSA, OA, HTN, Anemia.    Patient Stated Goals  Get back to normal life.    Currently in Pain?  Yes    Pain Score  5     Pain Location  Knee    Pain Orientation  Right    Pain Descriptors / Indicators  Discomfort;Aching    Pain Onset  In the past 7 days         [facility] PT Assessment - 08/25/19 0001      ROM / Strength   AROM / PROM / Strength  PROM      PROM   Overall PROM Comments  -10 to 90 degrees.                   [facility] Adult PT Treatment/Exercise - 08/25/19 0001      Exercises   Exercises  Knee/Hip      Knee/Hip Exercises: Aerobic   Nustep  Level 1 x 16 minutes.      Knee/Hip Exercises: Standing  Rocker Board Limitations  3 minutes.      Modalities   Modalities  Vasopneumatic      Vasopneumatic   Number Minutes Vasopneumatic   20 minutes    Vasopnuematic Location   --   Right knee.   Vasopneumatic Pressure  Low      Manual Therapy   Manual Therapy  Passive ROM    Passive ROM  PROM x 6 minutes into right knee flexion and extension in supine.               PT Short Term Goals - 08/20/19 1502      PT SHORT TERM GOAL #1   Title  STG's=LTG's.        PT Long Term Goals - 08/20/19 1504      PT LONG TERM GOAL #1   Title  Independent with a HEP.    Time  4    Period  Weeks    Status  New      PT LONG TERM GOAL #2   Title  Full right active knee extension in order to normalize gait.    Time  4    Period  Weeks    Status  New      PT LONG TERM GOAL #3   Title  Active right knee flexion to 115 degrees+ so the patient can perform functional tasks and do so with pain not > 2-3/10.    Time  4    Period  Weeks    Status  New      PT LONG TERM GOAL #4   Title  Increase right knee strength to a solid 4+/5 to provide good stability for accomplishment of functional  activities.    Time  4    Period  Weeks    Status  New      PT LONG TERM GOAL #5   Title  Perform a reciprocating stair gait with one railing with pain not > 2-3/10.    Time  4    Period  Weeks    Status  New            Plan - 08/25/19 1155    Clinical Impression Statement  Patient still having signifcant pain but she did well today and was able to achieve 90 degrees of passive right knee flexion.    Personal Factors and Comorbidities  Comorbidity 1;Comorbidity 2    Comorbidities  OSA, TSA, OA, HTN, Anemia.    Examination-Activity Limitations  Other;Transfers;Locomotion Level;Stairs;Squat    Examination-Participation Restrictions  Other    Stability/Clinical Decision Making  Stable/Uncomplicated    Rehab Potential  Good    PT Frequency  3x / week    PT Duration  4 weeks    PT Treatment/Interventions  ADLs/Self Care Home Management;Cryotherapy;Electrical Stimulation;Gait training;Stair training;Functional mobility training;Therapeutic activities;Therapeutic exercise;Neuromuscular re-education;Manual techniques;Patient/family education;Passive range of motion;Vasopneumatic Device    Consulted and Agree with Plan of Care  Patient       Patient will benefit from skilled therapeutic intervention in order to improve the following deficits and impairments:  Pain, Decreased activity tolerance, Decreased strength, Increased edema, Decreased range of motion  Visit Diagnosis: Chronic pain of right knee  Stiffness of right knee, not elsewhere classified  Localized edema     Problem List Patient Active Problem List   Diagnosis Date Noted  . S/P left TKA 08/17/2019  . Status post total right knee replacement 08/17/2019  . OAB (overactive bladder) 06/22/2019  . ILD (  interstitial lung disease) ([place]) 12/15/2018  . Enlarged heart 06/16/2018  . Morbid obesity ([place]) 10/17/2017  . 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 Hot flashes, menopausal 12/31/2016  . DDD (degenerative  disc disease), lumbar 11/06/2015  . Right-sided low back pain without sciatica 06/21/2015  . Diastolic dysfunction without heart failure 11/13/2014  . Other abnormal glucose 05/27/2013  . Osteopenia of the elderly 05/27/2013  . Hyperlipidemia LDL goal <130 09/04/2012  . Esophageal stricture 09/12/2011  . Visit for screening mammogram 08/08/2011  . Routine general medical examination at a health care facility 08/08/2011  . Essential hypertension, benign 01/14/2011  . URINARY INCONTINENCE 12/14/2008  . OA (osteoarthritis) 07/21/2008  . Obstructive sleep apnea 10/14/2007  . GERD 10/14/2007    Veronica Garza, Veronica Garza 08/25/2019, 11:58 AM  [facility] [address] [place], [place], [address] Phone: [phone]   Fax:  [phone]  Name: Veronica Garza MRN: [id number] Date of Birth: 01/17/1941

## 2019-08-27 ENCOUNTER — Ambulatory Visit: Payer: Medicare Other | Admitting: Physical Therapy

## 2019-08-27 ENCOUNTER — Encounter: Payer: Self-pay | Admitting: Physical Therapy

## 2019-08-27 ENCOUNTER — Other Ambulatory Visit: Payer: Self-pay

## 2019-08-27 DIAGNOSIS — M6281 Muscle weakness (generalized): Secondary | ICD-10-CM | POA: Diagnosis not present

## 2019-08-27 DIAGNOSIS — R262 Difficulty in walking, not elsewhere classified: Secondary | ICD-10-CM

## 2019-08-27 DIAGNOSIS — M25661 Stiffness of right knee, not elsewhere classified: Secondary | ICD-10-CM

## 2019-08-27 DIAGNOSIS — G8929 Other chronic pain: Secondary | ICD-10-CM

## 2019-08-27 DIAGNOSIS — R600 Localized edema: Secondary | ICD-10-CM | POA: Diagnosis not present

## 2019-08-27 DIAGNOSIS — M25561 Pain in right knee: Secondary | ICD-10-CM | POA: Diagnosis not present

## 2019-08-27 NOTE — Therapy (Signed)
[facility] [place], [place], [address] Phone: [phone]   Fax:  [phone]  Physical Therapy Treatment  Patient Details  Name: Veronica Garza MRN: [id number] Date of Birth: February 02, 1941 Referring Provider (PT): Paralee Cancel MD   Encounter Date: 08/27/2019  PT End of Session - 08/27/19 [id number]    Visit Number  4    Number of Visits  16    Date for PT Re-Evaluation  11/18/19    Authorization Type  FOTO AT LEAST EVERY 5TH VISIT.  PROGRESS NOTE AT 10TH VISIT.  KX MODIFIER AFTER 15 VISITS.    PT Start Time  1030    PT Stop Time  1126    PT Time Calculation (min)  56 min    Activity Tolerance  Patient tolerated treatment well    Behavior During Therapy  WFL for tasks assessed/performed       Past Medical History:  Diagnosis Date  . Anemia   . Arthritis   . BCC (basal cell carcinoma) 07/19/2014   Right cheek  . Cataract   . Complication of anesthesia    takes patient a long time to wake up  . GERD (gastroesophageal reflux disease)   . Hx of shoulder replacement    lt  . Hypertension   . OSA (obstructive sleep apnea)    use CPAP  . Pneumonia    History of  . Urinary incontinence     Past Surgical History:  Procedure Laterality Date  . ABDOMINAL HYSTERECTOMY    . EYE SURGERY     bilateral cataract extraction  . FINGER SURGERY     cyst left middle finger  . JOINT REPLACEMENT     left shoulder  . NASAL SINUS SURGERY    . SHOULDER ARTHROSCOPY     Left  . TOTAL KNEE ARTHROPLASTY Right 08/17/2019   Procedure: TOTAL KNEE ARTHROPLASTY;  Surgeon: Paralee Cancel, MD;  Location: [facility];  Service: Orthopedics;  Laterality: Right;  70 mins  . TOTAL SHOULDER ARTHROPLASTY  10/10/2011   Procedure: TOTAL SHOULDER ARTHROPLASTY;  Surgeon: Metta Clines Supple;  Location: [facility];  Service: Orthopedics;  Laterality: Right;  . UPPER GASTROINTESTINAL ENDOSCOPY    . Vaginal Sling      There were no vitals filed for this  visit.  Subjective Assessment - 08/27/19 1226    Subjective  COVID-19 screen performed prior to patient entering clinic.  I walking better.    Pertinent History  OSA, TSA, OA, HTN, Anemia.    Patient Stated Goals  Get back to normal life.    Currently in Pain?  Yes    Pain Score  5     Pain Location  Knee    Pain Orientation  Right    Pain Descriptors / Indicators  Discomfort;Aching    Pain Type  Surgical pain    Pain Onset  In the past 7 days                       [facility] Adult PT Treatment/Exercise - 08/27/19 0001      Exercises   Exercises  Knee/Hip      Knee/Hip Exercises: Aerobic   Nustep  Level 3 x 16 minutes.      Vasopneumatic   Number Minutes Vasopneumatic   20 minutes    Vasopnuematic Location   --   RT knee.   Vasopneumatic Pressure  Low      Manual Therapy   Manual  Therapy  Passive ROM    Passive ROM  PROM in supine to patient's right knee x 12 minutes into flexion and extension with low load long duration stretching technique utilized.               PT Short Term Goals - 08/20/19 1502      PT SHORT TERM GOAL #1   Title  STG's=LTG's.        PT Long Term Goals - 08/20/19 1504      PT LONG TERM GOAL #1   Title  Independent with a HEP.    Time  4    Period  Weeks    Status  New      PT LONG TERM GOAL #2   Title  Full right active knee extension in order to normalize gait.    Time  4    Period  Weeks    Status  New      PT LONG TERM GOAL #3   Title  Active right knee flexion to 115 degrees+ so the patient can perform functional tasks and do so with pain not > 2-3/10.    Time  4    Period  Weeks    Status  New      PT LONG TERM GOAL #4   Title  Increase right knee strength to a solid 4+/5 to provide good stability for accomplishment of functional activities.    Time  4    Period  Weeks    Status  New      PT LONG TERM GOAL #5   Title  Perform a reciprocating stair gait with one railing with pain not > 2-3/10.    Time   4    Period  Weeks    Status  New            Plan - 08/27/19 1237    Clinical Impression Statement  Patient walking with increased cadenece and less antalgia.  She was able to achieve passive right knee flexion to 90 degrees and a significant improvement with regards to extension.    Personal Factors and Comorbidities  Comorbidity 1;Comorbidity 2    Comorbidities  OSA, TSA, OA, HTN, Anemia.    Examination-Activity Limitations  Other;Transfers;Locomotion Level;Stairs;Squat    Examination-Participation Restrictions  Personal Finances;Meal Prep    Stability/Clinical Decision Making  Stable/Uncomplicated    Rehab Potential  Good    PT Frequency  3x / week    PT Duration  4 weeks    PT Treatment/Interventions  ADLs/Self Care Home Management;Cryotherapy;Electrical Stimulation;Gait training;Stair training;Functional mobility training;Therapeutic activities;Therapeutic exercise;Neuromuscular re-education;Manual techniques;Patient/family education;Passive range of motion;Vasopneumatic Device    PT Next Visit Plan  Progress into TKA protocol.  Vasopneumatic and electrical stimulation.    Consulted and Agree with Plan of Care  Patient       Patient will benefit from skilled therapeutic intervention in order to improve the following deficits and impairments:  Pain, Decreased activity tolerance, Decreased strength, Increased edema, Decreased range of motion  Visit Diagnosis: Chronic pain of right knee  Stiffness of right knee, not elsewhere classified  Localized edema  Muscle weakness (generalized)  Difficulty in walking, not elsewhere classified     Problem List Patient Active Problem List   Diagnosis Date Noted  . S/P left TKA 08/17/2019  . Status post total right knee replacement 08/17/2019  . OAB (overactive bladder) 06/22/2019  . ILD (interstitial lung disease) ([place]) 12/15/2018  . Enlarged heart 06/16/2018  . Morbid  obesity ([place]) 10/17/2017  . Functional belching disorder  12/31/2016  . Hot flashes, menopausal 12/31/2016  . DDD (degenerative disc disease), lumbar 11/06/2015  . Right-sided low back pain without sciatica 06/21/2015  . Diastolic dysfunction without heart failure 11/13/2014  . Other abnormal glucose 05/27/2013  . Osteopenia of the elderly 05/27/2013  . Hyperlipidemia LDL goal <130 09/04/2012  . Esophageal stricture 09/12/2011  . Visit for screening mammogram 08/08/2011  . Routine general medical examination at a health care facility 08/08/2011  . Essential hypertension, benign 01/14/2011  . URINARY INCONTINENCE 12/14/2008  . OA (osteoarthritis) 07/21/2008  . Obstructive sleep apnea 10/14/2007  . GERD 10/14/2007    , [place] MPT 08/27/2019, 12:43 PM  [facility] [address] [place], [place], [address] Phone: [phone]   Fax:  [phone]  Name: Veronica Garza MRN: [id number] Date of Birth: [birth year]

## 2019-08-30 ENCOUNTER — Ambulatory Visit: Payer: Medicare Other | Admitting: Physical Therapy

## 2019-08-30 ENCOUNTER — Other Ambulatory Visit: Payer: Self-pay

## 2019-08-30 ENCOUNTER — Encounter: Payer: Self-pay | Admitting: Physical Therapy

## 2019-08-30 DIAGNOSIS — G8929 Other chronic pain: Secondary | ICD-10-CM

## 2019-08-30 DIAGNOSIS — M25661 Stiffness of right knee, not elsewhere classified: Secondary | ICD-10-CM

## 2019-08-30 DIAGNOSIS — R600 Localized edema: Secondary | ICD-10-CM | POA: Diagnosis not present

## 2019-08-30 DIAGNOSIS — M25561 Pain in right knee: Secondary | ICD-10-CM | POA: Diagnosis not present

## 2019-08-30 DIAGNOSIS — R262 Difficulty in walking, not elsewhere classified: Secondary | ICD-10-CM | POA: Diagnosis not present

## 2019-08-30 DIAGNOSIS — M6281 Muscle weakness (generalized): Secondary | ICD-10-CM

## 2019-08-30 NOTE — Therapy (Signed)
[facility] [place], [place], [address] Phone: [phone]   Fax:  [phone]  Physical Therapy Treatment  Patient Details  Name: Veronica Garza MRN: [id number] Date of Birth: [birth year] Referring Provider (PT): Paralee Cancel MD   Encounter Date: 08/30/2019  PT End of Session - 08/30/19 1109    Visit Number  5    Number of Visits  16    Date for PT Re-Evaluation  11/18/19    Authorization Type  FOTO AT LEAST EVERY 5TH VISIT.  PROGRESS NOTE AT 10TH VISIT.  KX MODIFIER AFTER 15 VISITS.    PT Start Time  1031    PT Stop Time  1123    PT Time Calculation (min)  52 min    Activity Tolerance  Patient tolerated treatment well    Behavior During Therapy  WFL for tasks assessed/performed       Past Medical History:  Diagnosis Date  . Anemia   . Arthritis   . BCC (basal cell carcinoma) 07/19/2014   Right cheek  . Cataract   . Complication of anesthesia    takes patient a long time to wake up  . GERD (gastroesophageal reflux disease)   . Hx of shoulder replacement    lt  . Hypertension   . OSA (obstructive sleep apnea)    use CPAP  . Pneumonia    History of  . Urinary incontinence     Past Surgical History:  Procedure Laterality Date  . ABDOMINAL HYSTERECTOMY    . EYE SURGERY     bilateral cataract extraction  . FINGER SURGERY     cyst left middle finger  . JOINT REPLACEMENT     left shoulder  . NASAL SINUS SURGERY    . SHOULDER ARTHROSCOPY     Left  . TOTAL KNEE ARTHROPLASTY Right 08/17/2019   Procedure: TOTAL KNEE ARTHROPLASTY;  Surgeon: Paralee Cancel, MD;  Location: [facility];  Service: Orthopedics;  Laterality: Right;  70 mins  . TOTAL SHOULDER ARTHROPLASTY  10/10/2011   Procedure: TOTAL SHOULDER ARTHROPLASTY;  Surgeon: Metta Clines Supple;  Location: [facility];  Service: Orthopedics;  Laterality: Right;  . UPPER GASTROINTESTINAL ENDOSCOPY    . Vaginal Sling      There were no vitals filed for this  visit.  Subjective Assessment - 08/30/19 1041    Subjective  COVID-19 screen performed prior to patient entering clinic.  Patient arrived with c/o pain and spasms over the weekend, with ongoing pain today    Pertinent History  OSA, TSA, OA, HTN, Anemia.    Patient Stated Goals  Get back to normal life.    Currently in Pain?  Yes    Pain Score  7     Pain Location  Knee    Pain Orientation  Right    Pain Descriptors / Indicators  Discomfort    Pain Type  Surgical pain    Pain Onset  1 to 4 weeks ago    Pain Frequency  Constant    Aggravating Factors   movemnt    Pain Relieving Factors  rest         [facility] PT Assessment - 08/30/19 0001      ROM / Strength   AROM / PROM / Strength  AROM;PROM      AROM   AROM Assessment Site  Knee    Right/Left Knee  Right    Right Knee Extension  -6    Right Knee Flexion  80      PROM   PROM Assessment Site  Knee    Right/Left Knee  Right    Right Knee Extension  -4    Right Knee Flexion  95                   [facility] Adult PT Treatment/Exercise - 08/30/19 0001      Knee/Hip Exercises: Aerobic   Nustep  Level 3 x 10 min      Knee/Hip Exercises: Standing   Rocker Board  2 minutes      Vasopneumatic   Number Minutes Vasopneumatic   15 minutes    Vasopnuematic Location   Knee    Vasopneumatic Pressure  Low      Manual Therapy   Manual Therapy  Passive ROM    Passive ROM  manual PROM for right knee flexion and ext with low load holds             PT Education - 08/30/19 1113    Education Details  HEP    Person(s) Educated  Patient    Methods  Explanation;Demonstration;Handout    Comprehension  Verbalized understanding;Returned demonstration       PT Short Term Goals - 08/20/19 1502      PT SHORT TERM GOAL #1   Title  STG's=LTG's.        PT Long Term Goals - 08/30/19 1127      PT LONG TERM GOAL #1   Title  Independent with a HEP.    Time  4    Period  Weeks    Status  On-going   issued today 08/30/19      PT LONG TERM GOAL #2   Title  Full right active knee extension in order to normalize gait.    Time  4    Period  Weeks    Status  On-going      PT LONG TERM GOAL #3   Title  Active right knee flexion to 115 degrees+ so the patient can perform functional tasks and do so with pain not > 2-3/10.    Period  Weeks    Status  On-going      PT LONG TERM GOAL #4   Title  Increase right knee strength to a solid 4+/5 to provide good stability for accomplishment of functional activities.    Time  4    Period  Weeks    Status  On-going      PT LONG TERM GOAL #5   Title  Perform a reciprocating stair gait with one railing with pain not > 2-3/10.    Time  4    Period  Weeks    Status  On-going            Plan - 08/30/19 1113    Clinical Impression Statement  Patient tolerated treatment well today. Patient has improved with ROM and able to perform SLR without assistance. Patient continues to report increased pain and spasms yet taking meds to help. Patient given HEP today to help improve strength and mobility. Goals progressing.    Personal Factors and Comorbidities  Comorbidity 1;Comorbidity 2    Comorbidities  OSA, TSA, OA, HTN, Anemia.    Examination-Activity Limitations  Other;Transfers;Locomotion Level;Stairs;Squat    Examination-Participation Restrictions  Personal Finances;Meal Prep    Stability/Clinical Decision Making  Stable/Uncomplicated    Rehab Potential  Good    PT Frequency  3x / week    PT Duration  4 weeks    PT Treatment/Interventions  ADLs/Self Care Home Management;Cryotherapy;Electrical Stimulation;Gait training;Stair training;Functional mobility training;Therapeutic activities;Therapeutic exercise;Neuromuscular re-education;Manual techniques;Patient/family education;Passive range of motion;Vasopneumatic Device    PT Next Visit Plan  Progress into TKA protocol.  Vasopneumatic and electrical stimulation. MD note next visit    Consulted and Agree with Plan of Care   Patient       Patient will benefit from skilled therapeutic intervention in order to improve the following deficits and impairments:  Pain, Decreased activity tolerance, Decreased strength, Increased edema, Decreased range of motion  Visit Diagnosis: Chronic pain of right knee  Stiffness of right knee, not elsewhere classified  Localized edema  Muscle weakness (generalized)     Problem List Patient Active Problem List   Diagnosis Date Noted  . S/P left TKA 08/17/2019  . Status post total right knee replacement 08/17/2019  . OAB (overactive bladder) 06/22/2019  . ILD (interstitial lung disease) ([place]) 12/15/2018  . Enlarged heart 06/16/2018  . Morbid obesity ([place]) 10/17/2017  . 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 Hot flashes, menopausal 12/31/2016  . DDD (degenerative disc disease), lumbar 11/06/2015  . Right-sided low back pain without sciatica 06/21/2015  . Diastolic dysfunction without heart failure 11/13/2014  . Other abnormal glucose 05/27/2013  . Osteopenia of the elderly 05/27/2013  . Hyperlipidemia LDL goal <130 09/04/2012  . Esophageal stricture 09/12/2011  . Visit for screening mammogram 08/08/2011  . Routine general medical examination at a health care facility 08/08/2011  . Essential hypertension, benign 01/14/2011  . URINARY INCONTINENCE 12/14/2008  . OA (osteoarthritis) 07/21/2008  . Obstructive sleep apnea 10/14/2007  . GERD 10/14/2007    Phillips Climes, PTA 08/30/2019, 11:28 AM  [facility] [place], [place], [address] Phone: [phone]   Fax:  [phone]  Name: Veronica Garza MRN: [id number] Date of Birth: February 13, 1941

## 2019-08-30 NOTE — Patient Instructions (Signed)
  Sitting knee extension stretch    Place one foot on table. Straighten leg and attempt to keep it straight, then push down until feel a stretch. Hold _30__ seconds. Repeat __5-10_ times each leg, alternating. Do _2-4__ sessions per day.      Knee Flexion Stretch on Step  Place foot on step and lean forward until you feel a good stretch in front of knee.   hold 30 sec x 5-10 perform 2-4 x daily     Straight Leg Raise   Tighten stomach and slowly raise locked right leg __4__ inches from floor. Repeat __10-20__ times per set. Do __1__ sets per session. Do __2__ sessions per day.

## 2019-09-01 ENCOUNTER — Ambulatory Visit: Payer: Medicare Other | Admitting: Physical Therapy

## 2019-09-01 ENCOUNTER — Other Ambulatory Visit: Payer: Self-pay

## 2019-09-01 ENCOUNTER — Encounter: Payer: Self-pay | Admitting: Physical Therapy

## 2019-09-01 DIAGNOSIS — R600 Localized edema: Secondary | ICD-10-CM

## 2019-09-01 DIAGNOSIS — M25561 Pain in right knee: Secondary | ICD-10-CM | POA: Diagnosis not present

## 2019-09-01 DIAGNOSIS — M25661 Stiffness of right knee, not elsewhere classified: Secondary | ICD-10-CM | POA: Diagnosis not present

## 2019-09-01 DIAGNOSIS — M6281 Muscle weakness (generalized): Secondary | ICD-10-CM | POA: Diagnosis not present

## 2019-09-01 DIAGNOSIS — R262 Difficulty in walking, not elsewhere classified: Secondary | ICD-10-CM | POA: Diagnosis not present

## 2019-09-01 DIAGNOSIS — G8929 Other chronic pain: Secondary | ICD-10-CM

## 2019-09-01 NOTE — Therapy (Signed)
[facility] [place], [place], [address] Phone: [phone]   Fax:  [phone]  Physical Therapy Treatment  Patient Details  Name: Veronica Garza MRN: [id number] Date of Birth: 10-Jun-1941 Referring Provider (PT): Paralee Cancel MD   Encounter Date: 09/01/2019  PT End of Session - 09/01/19 1130    Visit Number  6    Number of Visits  16    Date for PT Re-Evaluation  11/18/19    Authorization Type  FOTO AT LEAST EVERY 5TH VISIT.  PROGRESS NOTE AT 10TH VISIT.  KX MODIFIER AFTER 15 VISITS.    PT Start Time  1030    PT Stop Time  1131    PT Time Calculation (min)  61 min    Activity Tolerance  Patient tolerated treatment well    Behavior During Therapy  WFL for tasks assessed/performed       Past Medical History:  Diagnosis Date  . Anemia   . Arthritis   . BCC (basal cell carcinoma) 07/19/2014   Right cheek  . Cataract   . Complication of anesthesia    takes patient a long time to wake up  . GERD (gastroesophageal reflux disease)   . Hx of shoulder replacement    lt  . Hypertension   . OSA (obstructive sleep apnea)    use CPAP  . Pneumonia    History of  . Urinary incontinence     Past Surgical History:  Procedure Laterality Date  . ABDOMINAL HYSTERECTOMY    . EYE SURGERY     bilateral cataract extraction  . FINGER SURGERY     cyst left middle finger  . JOINT REPLACEMENT     left shoulder  . NASAL SINUS SURGERY    . SHOULDER ARTHROSCOPY     Left  . TOTAL KNEE ARTHROPLASTY Right 08/17/2019   Procedure: TOTAL KNEE ARTHROPLASTY;  Surgeon: Paralee Cancel, MD;  Location: [facility];  Service: Orthopedics;  Laterality: Right;  70 mins  . TOTAL SHOULDER ARTHROPLASTY  10/10/2011   Procedure: TOTAL SHOULDER ARTHROPLASTY;  Surgeon: Metta Clines Supple;  Location: [facility];  Service: Orthopedics;  Laterality: Right;  . UPPER GASTROINTESTINAL ENDOSCOPY    . Vaginal Sling      There were no vitals filed for this  visit.  Subjective Assessment - 09/01/19 1042    Subjective  COVID-19 screen performed prior to patient entering clinic.  Patient arrived with tighness and ongoing discomfort. MD changed appt to friday.    Pertinent History  OSA, TSA, OA, HTN, Anemia.    Patient Stated Goals  Get back to normal life.    Currently in Pain?  Yes    Pain Score  6     Pain Location  Knee    Pain Orientation  Right    Pain Descriptors / Indicators  Discomfort    Pain Type  Surgical pain    Pain Onset  1 to 4 weeks ago    Pain Frequency  Constant    Aggravating Factors   bending knee    Pain Relieving Factors  rest         [facility] PT Assessment - 09/01/19 0001      AROM   AROM Assessment Site  Knee    Right/Left Knee  Right    Right Knee Extension  -6    Right Knee Flexion  80      PROM   PROM Assessment Site  Knee    Right/Left  Knee  Right    Right Knee Extension  -4    Right Knee Flexion  99                   [facility] Adult PT Treatment/Exercise - 09/01/19 0001      Ambulation/Gait   Ambulation/Gait  Yes    Ambulation/Gait Assistance  5: Supervision;4: Min assist    Ambulation Distance (Feet)  55 Feet    Assistive device  Straight cane    Gait Pattern  Step-through pattern;Decreased arm swing - right;Decreased step length - right    Ambulation Surface  Level;Indoor    Gait Comments  patient is not ready to transition to cane at this time, yet she wanted to try today      Knee/Hip Exercises: Aerobic   Nustep  Level 3 x 15 min      Knee/Hip Exercises: Standing   Rocker Board  2 minutes      Knee/Hip Exercises: Supine   Straight Leg Raises  Strengthening;Right;10 reps;1 set      Vasopneumatic   Number Minutes Vasopneumatic   15 minutes    Vasopnuematic Location   Knee    Vasopneumatic Pressure  Low      Manual Therapy   Manual Therapy  Passive ROM    Passive ROM  manual PROM for right knee flexion and ext with low load holds               PT Short Term Goals -  08/20/19 1502      PT SHORT TERM GOAL #1   Title  STG's=LTG's.        PT Long Term Goals - 08/30/19 1127      PT LONG TERM GOAL #1   Title  Independent with a HEP.    Time  4    Period  Weeks    Status  On-going   issued today 08/30/19     PT LONG TERM GOAL #2   Title  Full right active knee extension in order to normalize gait.    Time  4    Period  Weeks    Status  On-going      PT LONG TERM GOAL #3   Title  Active right knee flexion to 115 degrees+ so the patient can perform functional tasks and do so with pain not > 2-3/10.    Period  Weeks    Status  On-going      PT LONG TERM GOAL #4   Title  Increase right knee strength to a solid 4+/5 to provide good stability for accomplishment of functional activities.    Time  4    Period  Weeks    Status  On-going      PT LONG TERM GOAL #5   Title  Perform a reciprocating stair gait with one railing with pain not > 2-3/10.    Time  4    Period  Weeks    Status  On-going            Plan - 09/01/19 1132    Clinical Impression Statement  Patient tolerated treatment well today. Patient progressing with ROM in right knee and able to perform SLR yet only 10 today. Patient requested walking with cane today. Today started gait training with SPC. Patient is not ready to transistion to [facility] at this time yet was able to walk with supervision and some min assist for pace and technique. Patient progressing toward goals.  Personal Factors and Comorbidities  Comorbidity 1;Comorbidity 2    Comorbidities  OSA, TSA, OA, HTN, Anemia.    Examination-Activity Limitations  Other;Transfers;Locomotion Level;Stairs;Squat    Examination-Participation Restrictions  Personal Finances;Meal Prep    Stability/Clinical Decision Making  Stable/Uncomplicated    Rehab Potential  Good    PT Frequency  3x / week    PT Treatment/Interventions  ADLs/Self Care Home Management;Cryotherapy;Electrical Stimulation;Gait training;Stair training;Functional  mobility training;Therapeutic activities;Therapeutic exercise;Neuromuscular re-education;Manual techniques;Patient/family education;Passive range of motion;Vasopneumatic Device    PT Next Visit Plan  Cont with POC for ROM and strengthening Vasopneumatic and electrical stimulation. MD note next visit    Consulted and Agree with Plan of Care  Patient       Patient will benefit from skilled therapeutic intervention in order to improve the following deficits and impairments:  Pain, Decreased activity tolerance, Decreased strength, Increased edema, Decreased range of motion  Visit Diagnosis: Chronic pain of right knee  Stiffness of right knee, not elsewhere classified  Localized edema  Muscle weakness (generalized)  Difficulty in walking, not elsewhere classified     Problem List Patient Active Problem List   Diagnosis Date Noted  . S/P left TKA 08/17/2019  . Status post total right knee replacement 08/17/2019  . OAB (overactive bladder) 06/22/2019  . ILD (interstitial lung disease) ([place]) 12/15/2018  . Enlarged heart 06/16/2018  . Morbid obesity ([place]) 10/17/2017  . 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 Hot flashes, menopausal 12/31/2016  . DDD (degenerative disc disease), lumbar 11/06/2015  . Right-sided low back pain without sciatica 06/21/2015  . Diastolic dysfunction without heart failure 11/13/2014  . Other abnormal glucose 05/27/2013  . Osteopenia of the elderly 05/27/2013  . Hyperlipidemia LDL goal <130 09/04/2012  . Esophageal stricture 09/12/2011  . Visit for screening mammogram 08/08/2011  . Routine general medical examination at a health care facility 08/08/2011  . Essential hypertension, benign 01/14/2011  . URINARY INCONTINENCE 12/14/2008  . OA (osteoarthritis) 07/21/2008  . Obstructive sleep apnea 10/14/2007  . GERD 10/14/2007    Phillips Climes, PTA 09/01/2019, 11:44 AM  [facility] [place], [place], [address] Phone: [phone]   Fax:  [phone]  Name: Veronica Garza MRN: [id number] Date of Birth: 03-09-1941

## 2019-09-02 ENCOUNTER — Ambulatory Visit: Payer: Medicare Other | Attending: Orthopedic Surgery | Admitting: Physical Therapy

## 2019-09-02 ENCOUNTER — Other Ambulatory Visit: Payer: Self-pay

## 2019-09-02 DIAGNOSIS — R262 Difficulty in walking, not elsewhere classified: Secondary | ICD-10-CM | POA: Insufficient documentation

## 2019-09-02 DIAGNOSIS — G8929 Other chronic pain: Secondary | ICD-10-CM | POA: Diagnosis not present

## 2019-09-02 DIAGNOSIS — M25661 Stiffness of right knee, not elsewhere classified: Secondary | ICD-10-CM

## 2019-09-02 DIAGNOSIS — M25561 Pain in right knee: Secondary | ICD-10-CM | POA: Diagnosis not present

## 2019-09-02 DIAGNOSIS — R600 Localized edema: Secondary | ICD-10-CM

## 2019-09-02 DIAGNOSIS — M6281 Muscle weakness (generalized): Secondary | ICD-10-CM

## 2019-09-02 NOTE — Therapy (Signed)
[facility] [place], [place], [address] Phone: [phone]   Fax:  [phone]  Physical Therapy Treatment  Patient Details  Name: Veronica Garza MRN: [id number] Date of Birth: 09/04/1941 Referring Provider (PT): Paralee Cancel MD   Encounter Date: 09/02/2019  PT End of Session - 09/02/19 1418    Visit Number  7    Number of Visits  16    Date for PT Re-Evaluation  11/18/19    Authorization Type  FOTO AT LEAST EVERY 5TH VISIT.  PROGRESS NOTE AT 10TH VISIT.  KX MODIFIER AFTER 15 VISITS.    PT Start Time  0141    PT Stop Time  0228    PT Time Calculation (min)  47 min    Activity Tolerance  Patient tolerated treatment well    Behavior During Therapy  [facility] for tasks assessed/performed       Past Medical History:  Diagnosis Date  . Anemia   . Arthritis   . BCC (basal cell carcinoma) 07/19/2014   Right cheek  . Cataract   . Complication of anesthesia    takes patient a long time to wake up  . GERD (gastroesophageal reflux disease)   . Hx of shoulder replacement    lt  . Hypertension   . OSA (obstructive sleep apnea)    use CPAP  . Pneumonia    History of  . Urinary incontinence     Past Surgical History:  Procedure Laterality Date  . ABDOMINAL HYSTERECTOMY    . EYE SURGERY     bilateral cataract extraction  . FINGER SURGERY     cyst left middle finger  . JOINT REPLACEMENT     left shoulder  . NASAL SINUS SURGERY    . SHOULDER ARTHROSCOPY     Left  . TOTAL KNEE ARTHROPLASTY Right 08/17/2019   Procedure: TOTAL KNEE ARTHROPLASTY;  Surgeon: Paralee Cancel, MD;  Location: [facility];  Service: Orthopedics;  Laterality: Right;  70 mins  . TOTAL SHOULDER ARTHROPLASTY  10/10/2011   Procedure: TOTAL SHOULDER ARTHROPLASTY;  Surgeon: Metta Clines Supple;  Location: [place];  Service: Orthopedics;  Laterality: Right;  . UPPER GASTROINTESTINAL ENDOSCOPY    . Vaginal Sling      There were no vitals filed for this  visit.  Subjective Assessment - 09/02/19 1344    Subjective  COVID-19 screen performed prior to patient entering clinic.  Patient arrived with tighness and discomfort.    Pertinent History  OSA, TSA, OA, HTN, Anemia.    Patient Stated Goals  Get back to normal life.    Currently in Pain?  Yes    Pain Score  5     Pain Location  Knee    Pain Orientation  Right    Pain Descriptors / Indicators  Discomfort    Pain Type  Surgical pain    Pain Onset  1 to 4 weeks ago    Pain Frequency  Constant    Aggravating Factors   increased activity and bending knee    Pain Relieving Factors  at rest         [facility] PT Assessment - 09/02/19 0001      AROM   AROM Assessment Site  Knee    Right/Left Knee  Right    Right Knee Extension  -8    Right Knee Flexion  85      PROM   PROM Assessment Site  Knee    Right/Left Knee  Right    Right Knee Extension  -5    Right Knee Flexion  99                   [facility] Adult PT Treatment/Exercise - 09/02/19 0001      Knee/Hip Exercises: Stretches   Knee: Self-Stretch to increase Flexion  Right;3 reps;10 seconds   on 8" step     Knee/Hip Exercises: Aerobic   Nustep  L4 x 100min with UE/LE activity      Knee/Hip Exercises: Standing   Rocker Board  2 minutes      Knee/Hip Exercises: Supine   Short Arc Quad Sets  Strengthening;Right;2 sets;10 reps    Short Arc Quad Sets Limitations  3#    Straight Leg Raises  Strengthening;Right;10 reps;2 sets      Vasopneumatic   Number Minutes Vasopneumatic   15 minutes    Vasopnuematic Location   Knee    Vasopneumatic Pressure  Low      Manual Therapy   Manual Therapy  Passive ROM    Passive ROM  manual PROM for right knee flexion and ext with low load holds to impove mobility               PT Short Term Goals - 08/20/19 1502      PT SHORT TERM GOAL #1   Title  STG's=LTG's.        PT Long Term Goals - 09/02/19 1419      PT LONG TERM GOAL #1   Title  Independent with a HEP.     Time  4    Period  Weeks    Status  On-going      PT LONG TERM GOAL #2   Title  Full right active knee extension in order to normalize gait.    Time  4    Period  Weeks    Status  On-going   AROM -8 degrees 09/02/19     PT LONG TERM GOAL #3   Title  Active right knee flexion to 115 degrees+ so the patient can perform functional tasks and do so with pain not > 2-3/10.    Time  4    Period  Weeks    Status  On-going   AROM 85 degrees 09/02/19     PT LONG TERM GOAL #4   Title  Increase right knee strength to a solid 4+/5 to provide good stability for accomplishment of functional activities.    Time  4    Period  Weeks    Status  On-going      PT LONG TERM GOAL #5   Title  Perform a reciprocating stair gait with one railing with pain not > 2-3/10.    Time  4    Period  Weeks    Status  On-going            Plan - 09/02/19 1420    Clinical Impression Statement  Patient tolertated treatment well today and able to progress with right knee strengthening exercises. Patient has improved ROM in right knee this week. This week educated patient on assist device and started slowly with gait training with SPC and hutty-cane. Patient is not ready to transition at this time yet is able to practice in therapy with good form yet very slow pace and requires assit and supervision for safety. Patient current goals ongoing at this time.    Personal Factors and Comorbidities  Comorbidity 1;Comorbidity 2  Comorbidities  OSA, TSA, OA, HTN, Anemia.    Examination-Activity Limitations  Other;Transfers;Locomotion Level;Stairs;Squat    Examination-Participation Restrictions  Personal Finances;Meal Prep    Stability/Clinical Decision Making  Stable/Uncomplicated    Rehab Potential  Good    PT Frequency  3x / week    PT Treatment/Interventions  ADLs/Self Care Home Management;Cryotherapy;Electrical Stimulation;Gait training;Stair training;Functional mobility training;Therapeutic activities;Therapeutic  exercise;Neuromuscular re-education;Manual techniques;Patient/family education;Passive range of motion;Vasopneumatic Device    PT Next Visit Plan  Cont with POC for ROM and strengthening Vasopneumatic and electrical stimulation. MD note sent    Consulted and Agree with Plan of Care  Patient       Patient will benefit from skilled therapeutic intervention in order to improve the following deficits and impairments:  Pain, Decreased activity tolerance, Decreased strength, Increased edema, Decreased range of motion  Visit Diagnosis: Chronic pain of right knee  Stiffness of right knee, not elsewhere classified  Localized edema  Muscle weakness (generalized)     Problem List Patient Active Problem List   Diagnosis Date Noted  . S/P left TKA 08/17/2019  . Status post total right knee replacement 08/17/2019  . OAB (overactive bladder) 06/22/2019  . ILD (interstitial lung disease) ([place]) 12/15/2018  . Enlarged heart 06/16/2018  . Morbid obesity ([place]) 10/17/2017  . 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 Hot flashes, menopausal 12/31/2016  . DDD (degenerative disc disease), lumbar 11/06/2015  . Right-sided low back pain without sciatica 06/21/2015  . Diastolic dysfunction without heart failure 11/13/2014  . Other abnormal glucose 05/27/2013  . Osteopenia of the elderly 05/27/2013  . Hyperlipidemia LDL goal <130 09/04/2012  . Esophageal stricture 09/12/2011  . Visit for screening mammogram 08/08/2011  . Routine general medical examination at a health care facility 08/08/2011  . Essential hypertension, benign 01/14/2011  . URINARY INCONTINENCE 12/14/2008  . OA (osteoarthritis) 07/21/2008  . Obstructive sleep apnea 10/14/2007  . GERD 10/14/2007    Ladean Raya, PTA 09/02/19 2:30 PM  [facility] [address] [place], [place], [address] Phone: [phone]   Fax:  [phone]  Name: Veronica Garza MRN: [id number] Date of  Birth: Jun 06, 1941

## 2019-09-03 ENCOUNTER — Encounter: Payer: Medicare Other | Admitting: Physical Therapy

## 2019-09-06 ENCOUNTER — Ambulatory Visit: Payer: Medicare Other | Admitting: Physical Therapy

## 2019-09-06 ENCOUNTER — Other Ambulatory Visit: Payer: Self-pay

## 2019-09-06 ENCOUNTER — Encounter: Payer: Self-pay | Admitting: Physical Therapy

## 2019-09-06 DIAGNOSIS — M6281 Muscle weakness (generalized): Secondary | ICD-10-CM

## 2019-09-06 DIAGNOSIS — M25661 Stiffness of right knee, not elsewhere classified: Secondary | ICD-10-CM | POA: Diagnosis not present

## 2019-09-06 DIAGNOSIS — R600 Localized edema: Secondary | ICD-10-CM | POA: Diagnosis not present

## 2019-09-06 DIAGNOSIS — R262 Difficulty in walking, not elsewhere classified: Secondary | ICD-10-CM | POA: Diagnosis not present

## 2019-09-06 DIAGNOSIS — G8929 Other chronic pain: Secondary | ICD-10-CM

## 2019-09-06 DIAGNOSIS — M25561 Pain in right knee: Secondary | ICD-10-CM

## 2019-09-06 NOTE — Therapy (Signed)
[facility] [place], [place], [address] Phone: [phone]   Fax:  [phone]  Physical Therapy Treatment  Patient Details  Name: Veronica Garza MRN: [id number] Date of Birth: 03/05/1941 Referring Provider (PT): Paralee Cancel MD   Encounter Date: 09/06/2019  PT End of Session - 09/06/19 1118    Visit Number  8    Number of Visits  16    Date for PT Re-Evaluation  11/18/19    Authorization Type  FOTO AT LEAST EVERY 5TH VISIT.  PROGRESS NOTE AT 10TH VISIT.  KX MODIFIER AFTER 15 VISITS.    PT Start Time  1032    PT Stop Time  1128    PT Time Calculation (min)  56 min    Activity Tolerance  Patient tolerated treatment well;Patient limited by pain    Behavior During Therapy  [facility] for tasks assessed/performed       Past Medical History:  Diagnosis Date  . Anemia   . Arthritis   . BCC (basal cell carcinoma) 07/19/2014   Right cheek  . Cataract   . Complication of anesthesia    takes patient a long time to wake up  . GERD (gastroesophageal reflux disease)   . Hx of shoulder replacement    lt  . Hypertension   . OSA (obstructive sleep apnea)    use CPAP  . Pneumonia    History of  . Urinary incontinence     Past Surgical History:  Procedure Laterality Date  . ABDOMINAL HYSTERECTOMY    . EYE SURGERY     bilateral cataract extraction  . FINGER SURGERY     cyst left middle finger  . JOINT REPLACEMENT     left shoulder  . NASAL SINUS SURGERY    . SHOULDER ARTHROSCOPY     Left  . TOTAL KNEE ARTHROPLASTY Right 08/17/2019   Procedure: TOTAL KNEE ARTHROPLASTY;  Surgeon: Paralee Cancel, MD;  [facility];  Service: Orthopedics;  Laterality: Right;  70 mins  . TOTAL SHOULDER ARTHROPLASTY  10/10/2011   Procedure: TOTAL SHOULDER ARTHROPLASTY;  Surgeon: Metta Clines Supple;  Location: [place];  Service: Orthopedics;  Laterality: Right;  . UPPER GASTROINTESTINAL ENDOSCOPY    . Vaginal Sling      There were no vitals filed  for this visit.  Subjective Assessment - 09/06/19 1044    Subjective  COVID-19 screen performed prior to patient entering clinic.  Patient arrived with ongoing soreness. Went to MD and reported good progess and he removed bandages.    Pertinent History  OSA, TSA, OA, HTN, Anemia.    Patient Stated Goals  Get back to normal life.    Currently in Pain?  Yes    Pain Score  7     Pain Location  Knee    Pain Orientation  Right    Pain Descriptors / Indicators  Discomfort    Pain Type  Surgical pain    Pain Onset  1 to 4 weeks ago    Pain Frequency  Constant    Aggravating Factors   prolong sitting and movement    Pain Relieving Factors  at rest         [facility] PT Assessment - 09/06/19 0001      AROM   AROM Assessment Site  Knee    Right/Left Knee  Right    Right Knee Extension  -6    Right Knee Flexion  80      PROM  PROM Assessment Site  Knee    Right/Left Knee  Right    Right Knee Extension  -3    Right Knee Flexion  94                   [facility] Adult PT Treatment/Exercise - 09/06/19 0001      Ambulation/Gait   Ambulation/Gait  Yes    Ambulation/Gait Assistance  5: Supervision    Ambulation Distance (Feet)  45 Feet    Assistive device  --   hurry cane   Gait Pattern  Step-through pattern;Decreased arm swing - right;Decreased step length - right    Ambulation Surface  Level;Indoor    Gait Comments  patient had a slow pace yet independent with technique and supervision for safety      Knee/Hip Exercises: Stretches   Knee: Self-Stretch to increase Flexion  Right;3 reps;10 seconds   8"     Knee/Hip Exercises: Aerobic   Nustep  L4 x 109min with UE/LE activity      Knee/Hip Exercises: Standing   Forward Step Up  Right;1 set;10 reps;Step Height: 2";Hand Hold: 2    Rocker Board  2 minutes      Modalities   Modalities  Psychologist, educational Location  right knee    Landscape architect  IFC    Electrical Stimulation Parameters  1-10hz  x1min    Electrical Stimulation Goals  Edema;Pain      Vasopneumatic   Number Minutes Vasopneumatic   15 minutes    Vasopnuematic Location   Knee    Vasopneumatic Pressure  Low    Vasopneumatic Temperature   34      Manual Therapy   Manual Therapy  Passive ROM    Passive ROM  manual PROM for right knee flexion and ext with low load holds to impove mobility               PT Short Term Goals - 08/20/19 1502      PT SHORT TERM GOAL #1   Title  STG's=LTG's.        PT Long Term Goals - 09/02/19 1419      PT LONG TERM GOAL #1   Title  Independent with a HEP.    Time  4    Period  Weeks    Status  On-going      PT LONG TERM GOAL #2   Title  Full right active knee extension in order to normalize gait.    Time  4    Period  Weeks    Status  On-going   AROM -8 degrees 09/02/19     PT LONG TERM GOAL #3   Title  Active right knee flexion to 115 degrees+ so the patient can perform functional tasks and do so with pain not > 2-3/10.    Time  4    Period  Weeks    Status  On-going   AROM 85 degrees 09/02/19     PT LONG TERM GOAL #4   Title  Increase right knee strength to a solid 4+/5 to provide good stability for accomplishment of functional activities.    Time  4    Period  Weeks    Status  On-going      PT LONG TERM GOAL #5   Title  Perform a reciprocating stair gait with one railing with pain not > 2-3/10.    Time  4    Period  Weeks    Status  On-going            Plan - 09/06/19 1120    Clinical Impression Statement  Patient tolerated treatment fair today due to increased pain. Patient has decreased ROM for flexion today yet improved with ext. Patient reported increased soreness after prolong sitting in the car. Patient current goals ongoing.    Personal Factors and Comorbidities  Comorbidity 1;Comorbidity 2    Comorbidities  OSA, TSA, OA, HTN, Anemia.    Examination-Activity Limitations   Other;Transfers;Locomotion Level;Stairs;Squat    Examination-Participation Restrictions  Personal Finances;Meal Prep    Stability/Clinical Decision Making  Stable/Uncomplicated    Rehab Potential  Good    PT Frequency  3x / week    PT Duration  4 weeks    PT Treatment/Interventions  ADLs/Self Care Home Management;Cryotherapy;Electrical Stimulation;Gait training;Stair training;Functional mobility training;Therapeutic activities;Therapeutic exercise;Neuromuscular re-education;Manual techniques;Patient/family education;Passive range of motion;Vasopneumatic Device    PT Next Visit Plan  Cont with POC for ROM and strengthening Vasopneumatic and electrical stimulation.    Consulted and Agree with Plan of Care  Patient       Patient will benefit from skilled therapeutic intervention in order to improve the following deficits and impairments:  Pain, Decreased activity tolerance, Decreased strength, Increased edema, Decreased range of motion  Visit Diagnosis: Stiffness of right knee, not elsewhere classified  Chronic pain of right knee  Localized edema  Muscle weakness (generalized)     Problem List Patient Active Problem List   Diagnosis Date Noted  . S/P left TKA 08/17/2019  . Status post total right knee replacement 08/17/2019  . OAB (overactive bladder) 06/22/2019  . ILD (interstitial lung disease) ([place]) 12/15/2018  . Enlarged heart 06/16/2018  . Morbid obesity ([place]) 10/17/2017  . 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 Hot flashes, menopausal 12/31/2016  . DDD (degenerative disc disease), lumbar 11/06/2015  . Right-sided low back pain without sciatica 06/21/2015  . Diastolic dysfunction without heart failure 11/13/2014  . Other abnormal glucose 05/27/2013  . Osteopenia of the elderly 05/27/2013  . Hyperlipidemia LDL goal <130 09/04/2012  . Esophageal stricture 09/12/2011  . Visit for screening mammogram 08/08/2011  . Routine general medical examination at a health care  facility 08/08/2011  . Essential hypertension, benign 01/14/2011  . URINARY INCONTINENCE 12/14/2008  . OA (osteoarthritis) 07/21/2008  . Obstructive sleep apnea 10/14/2007  . GERD 10/14/2007    Javaria Knapke P, PTA 09/06/2019, 11:30 AM  [facility] [place], [place], [address] Phone: [phone]   Fax:  [phone]  Name: Veronica Garza MRN: [id number] Date of Birth: 1941/05/05

## 2019-09-08 ENCOUNTER — Ambulatory Visit: Payer: Medicare Other | Admitting: Physical Therapy

## 2019-09-08 ENCOUNTER — Other Ambulatory Visit: Payer: Self-pay

## 2019-09-08 ENCOUNTER — Encounter: Payer: Self-pay | Admitting: Physical Therapy

## 2019-09-08 DIAGNOSIS — M25561 Pain in right knee: Secondary | ICD-10-CM

## 2019-09-08 DIAGNOSIS — G8929 Other chronic pain: Secondary | ICD-10-CM | POA: Diagnosis not present

## 2019-09-08 DIAGNOSIS — M25661 Stiffness of right knee, not elsewhere classified: Secondary | ICD-10-CM | POA: Diagnosis not present

## 2019-09-08 DIAGNOSIS — R600 Localized edema: Secondary | ICD-10-CM | POA: Diagnosis not present

## 2019-09-08 DIAGNOSIS — M6281 Muscle weakness (generalized): Secondary | ICD-10-CM | POA: Diagnosis not present

## 2019-09-08 DIAGNOSIS — R262 Difficulty in walking, not elsewhere classified: Secondary | ICD-10-CM | POA: Diagnosis not present

## 2019-09-08 NOTE — Therapy (Signed)
[facility] [place], [place], [address] Phone: [phone]   Fax:  [phone]  Physical Therapy Treatment  Patient Details  Name: Veronica Garza MRN: [id number] Date of Birth: 23-Feb-1941 Referring Provider (PT): Paralee Cancel MD   Encounter Date: 09/08/2019  PT End of Session - 09/08/19 1113    Visit Number  9    Number of Visits  16    Date for PT Re-Evaluation  11/18/19    Authorization Type  FOTO AT LEAST EVERY 5TH VISIT.  PROGRESS NOTE AT 10TH VISIT.  KX MODIFIER AFTER 15 VISITS.    PT Start Time  1033    PT Stop Time  1130    PT Time Calculation (min)  57 min    Activity Tolerance  Patient tolerated treatment well    Behavior During Therapy  [facility] for tasks assessed/performed       Past Medical History:  Diagnosis Date  . Anemia   . Arthritis   . BCC (basal cell carcinoma) 07/19/2014   Right cheek  . Cataract   . Complication of anesthesia    takes patient a long time to wake up  . GERD (gastroesophageal reflux disease)   . Hx of shoulder replacement    lt  . Hypertension   . OSA (obstructive sleep apnea)    use CPAP  . Pneumonia    History of  . Urinary incontinence     Past Surgical History:  Procedure Laterality Date  . ABDOMINAL HYSTERECTOMY    . EYE SURGERY     bilateral cataract extraction  . FINGER SURGERY     cyst left middle finger  . JOINT REPLACEMENT     left shoulder  . NASAL SINUS SURGERY    . SHOULDER ARTHROSCOPY     Left  . TOTAL KNEE ARTHROPLASTY Right 08/17/2019   Procedure: TOTAL KNEE ARTHROPLASTY;  Surgeon: Paralee Cancel, MD;  [facility];  Service: Orthopedics;  Laterality: Right;  70 mins  . TOTAL SHOULDER ARTHROPLASTY  10/10/2011   Procedure: TOTAL SHOULDER ARTHROPLASTY;  Surgeon: Metta Clines Supple;  Location: [place];  Service: Orthopedics;  Laterality: Right;  . UPPER GASTROINTESTINAL ENDOSCOPY    . Vaginal Sling      There were no vitals filed for this  visit.  Subjective Assessment - 09/08/19 1035    Subjective  COVID-19 screen performed prior to patient entering clinic.  Patient arrived with less soreness today.    Pertinent History  OSA, TSA, OA, HTN, Anemia.    Patient Stated Goals  Get back to normal life.    Currently in Pain?  Yes    Pain Score  5     Pain Location  Knee    Pain Orientation  Right    Pain Descriptors / Indicators  Sore;Tightness    Pain Type  Surgical pain    Pain Onset  1 to 4 weeks ago    Pain Frequency  Constant    Aggravating Factors   bending knee and prolong activity    Pain Relieving Factors  ice and rest         [facility] PT Assessment - 09/08/19 0001      AROM   AROM Assessment Site  Knee    Right/Left Knee  Right    Right Knee Extension  -6    Right Knee Flexion  85      PROM   PROM Assessment Site  Knee    Right/Left Knee  Right    Right Knee Extension  -3    Right Knee Flexion  97                   [facility] Adult PT Treatment/Exercise - 09/08/19 0001      Ambulation/Gait   Ambulation/Gait  Yes    Ambulation/Gait Assistance  5: Supervision    Ambulation Distance (Feet)  52 Feet    Assistive device  Other (Comment)   hurry-cane   Gait Pattern  Step-through pattern;Decreased arm swing - right;Decreased step length - right    Ambulation Surface  Level;Indoor    Gait Comments  improved pace today and distance with confidence      Knee/Hip Exercises: Aerobic   Nustep  L4 x 72min with UE/LE activity, adjusted for ROM      Knee/Hip Exercises: Standing   Forward Step Up  Right;10 reps;Step Height: 2";Hand Hold: 2;2 Systems analyst  2 minutes      Electrical Stimulation   Electrical Stimulation Location  right knee    Electrical Stimulation Action  IFC    Electrical Stimulation Parameters  1-10hz  x46min    Electrical Stimulation Goals  Edema;Pain      Vasopneumatic   Number Minutes Vasopneumatic   15 minutes    Vasopnuematic Location   Knee    Vasopneumatic Pressure   Low    Vasopneumatic Temperature   34      Manual Therapy   Manual Therapy  Passive ROM;Soft tissue mobilization    Manual therapy comments  manual STW around knee to reduce tightness    Passive ROM  manual PROM for right knee flexion and ext with low load holds to impove mobility               PT Short Term Goals - 08/20/19 1502      PT SHORT TERM GOAL #1   Title  STG's=LTG's.        PT Long Term Goals - 09/02/19 1419      PT LONG TERM GOAL #1   Title  Independent with a HEP.    Time  4    Period  Weeks    Status  On-going      PT LONG TERM GOAL #2   Title  Full right active knee extension in order to normalize gait.    Time  4    Period  Weeks    Status  On-going   AROM -8 degrees 09/02/19     PT LONG TERM GOAL #3   Title  Active right knee flexion to 115 degrees+ so the patient can perform functional tasks and do so with pain not > 2-3/10.    Time  4    Period  Weeks    Status  On-going   AROM 85 degrees 09/02/19     PT LONG TERM GOAL #4   Title  Increase right knee strength to a solid 4+/5 to provide good stability for accomplishment of functional activities.    Time  4    Period  Weeks    Status  On-going      PT LONG TERM GOAL #5   Title  Perform a reciprocating stair gait with one railing with pain not > 2-3/10.    Time  4    Period  Weeks    Status  On-going            Plan - 09/08/19 1117  Clinical Impression Statement  Patient tolerated treatment well today. Patient arrived with less pain and was able to progress with activities today. Patient able to perform step ups with greater ease and gait training with a improved pace. Patients AROM and PROM improved in right knee today. All current goals progressing yet ongoing at this time.    Personal Factors and Comorbidities  Comorbidity 1;Comorbidity 2    Comorbidities  OSA, TSA, OA, HTN, Anemia.    Examination-Activity Limitations  Other;Transfers;Locomotion Level;Stairs;Squat     Examination-Participation Restrictions  Personal Finances;Meal Prep    Stability/Clinical Decision Making  Stable/Uncomplicated    Rehab Potential  Good    PT Frequency  3x / week    PT Duration  4 weeks    PT Treatment/Interventions  ADLs/Self Care Home Management;Cryotherapy;Electrical Stimulation;Gait training;Stair training;Functional mobility training;Therapeutic activities;Therapeutic exercise;Neuromuscular re-education;Manual techniques;Patient/family education;Passive range of motion;Vasopneumatic Device    PT Next Visit Plan  Cont with POC for ROM and strengthening Vasopneumatic and electrical stimulation.    Consulted and Agree with Plan of Care  Patient       Patient will benefit from skilled therapeutic intervention in order to improve the following deficits and impairments:  Pain, Decreased activity tolerance, Decreased strength, Increased edema, Decreased range of motion  Visit Diagnosis: Stiffness of right knee, not elsewhere classified  Chronic pain of right knee  Localized edema     Problem List Patient Active Problem List   Diagnosis Date Noted  . S/P left TKA 08/17/2019  . Status post total right knee replacement 08/17/2019  . OAB (overactive bladder) 06/22/2019  . ILD (interstitial lung disease) ([place]) 12/15/2018  . Enlarged heart 06/16/2018  . Morbid obesity ([place]) 10/17/2017  . 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 Hot flashes, menopausal 12/31/2016  . DDD (degenerative disc disease), lumbar 11/06/2015  . Right-sided low back pain without sciatica 06/21/2015  . Diastolic dysfunction without heart failure 11/13/2014  . Other abnormal glucose 05/27/2013  . Osteopenia of the elderly 05/27/2013  . Hyperlipidemia LDL goal <130 09/04/2012  . Esophageal stricture 09/12/2011  . Visit for screening mammogram 08/08/2011  . Routine general medical examination at a health care facility 08/08/2011  . Essential hypertension, benign 01/14/2011  . URINARY  INCONTINENCE 12/14/2008  . OA (osteoarthritis) 07/21/2008  . Obstructive sleep apnea 10/14/2007  . GERD 10/14/2007    Anshul Meddings P, PTA 09/08/2019, 11:31 AM  [facility] [place], [place], [address] Phone: [phone]   Fax:  [phone]  Name: ASHLEYANN FLAHERTY MRN: [id number] Date of Birth: [birth year]

## 2019-09-10 ENCOUNTER — Other Ambulatory Visit: Payer: Self-pay

## 2019-09-10 ENCOUNTER — Ambulatory Visit: Payer: Medicare Other | Admitting: Physical Therapy

## 2019-09-10 ENCOUNTER — Ambulatory Visit (INDEPENDENT_AMBULATORY_CARE_PROVIDER_SITE_OTHER): Payer: Medicare Other

## 2019-09-10 DIAGNOSIS — R600 Localized edema: Secondary | ICD-10-CM

## 2019-09-10 DIAGNOSIS — G8929 Other chronic pain: Secondary | ICD-10-CM

## 2019-09-10 DIAGNOSIS — Z23 Encounter for immunization: Secondary | ICD-10-CM | POA: Diagnosis not present

## 2019-09-10 DIAGNOSIS — R262 Difficulty in walking, not elsewhere classified: Secondary | ICD-10-CM | POA: Diagnosis not present

## 2019-09-10 DIAGNOSIS — M6281 Muscle weakness (generalized): Secondary | ICD-10-CM | POA: Diagnosis not present

## 2019-09-10 DIAGNOSIS — M25661 Stiffness of right knee, not elsewhere classified: Secondary | ICD-10-CM

## 2019-09-10 DIAGNOSIS — M25561 Pain in right knee: Secondary | ICD-10-CM | POA: Diagnosis not present

## 2019-09-10 NOTE — Therapy (Signed)
[facility] [place], [place], [address] Phone: [phone]   Fax:  [phone]  Physical Therapy Treatment  Patient Details  Name: Veronica Garza MRN: [id number] Date of Birth: 11-Mar-1941 Referring Provider (PT): Paralee Cancel MD   Encounter Date: 09/10/2019  PT End of Session - 09/10/19 1120    Visit Number  10    Number of Visits  16    Date for PT Re-Evaluation  11/18/19    Authorization Type  FOTO AT LEAST EVERY 5TH VISIT.  PROGRESS NOTE AT 10TH VISIT.  KX MODIFIER AFTER 15 VISITS.    PT Start Time  1030    PT Stop Time  1120    PT Time Calculation (min)  50 min    Activity Tolerance  Patient tolerated treatment well    Behavior During Therapy  WFL for tasks assessed/performed       Past Medical History:  Diagnosis Date  . Anemia   . Arthritis   . BCC (basal cell carcinoma) 07/19/2014   Right cheek  . Cataract   . Complication of anesthesia    takes patient a long time to wake up  . GERD (gastroesophageal reflux disease)   . Hx of shoulder replacement    lt  . Hypertension   . OSA (obstructive sleep apnea)    use CPAP  . Pneumonia    History of  . Urinary incontinence     Past Surgical History:  Procedure Laterality Date  . ABDOMINAL HYSTERECTOMY    . EYE SURGERY     bilateral cataract extraction  . FINGER SURGERY     cyst left middle finger  . JOINT REPLACEMENT     left shoulder  . NASAL SINUS SURGERY    . SHOULDER ARTHROSCOPY     Left  . TOTAL KNEE ARTHROPLASTY Right 08/17/2019   Procedure: TOTAL KNEE ARTHROPLASTY;  Surgeon: Paralee Cancel, MD;  Location: [facility];  Service: Orthopedics;  Laterality: Right;  70 mins  . TOTAL SHOULDER ARTHROPLASTY  10/10/2011   Procedure: TOTAL SHOULDER ARTHROPLASTY;  Surgeon: Metta Clines Supple;  Location: [place];  Service: Orthopedics;  Laterality: Right;  . UPPER GASTROINTESTINAL ENDOSCOPY    . Vaginal Sling      There were no vitals filed for this  visit.  Subjective Assessment - 09/10/19 1036    Subjective  COVID-19 screen performed prior to patient entering clinic.    Pertinent History  OSA, TSA, OA, HTN, Anemia.    Patient Stated Goals  Get back to normal life.    Currently in Pain?  Yes    Pain Score  5     Pain Location  Knee    Pain Orientation  Right    Pain Descriptors / Indicators  Sore;Tightness    Pain Type  Surgical pain    Pain Onset  1 to 4 weeks ago         [facility] PT Assessment - 09/10/19 0001      PROM   Right Knee Flexion  100                   [facility] Adult PT Treatment/Exercise - 09/10/19 0001      Exercises   Exercises  Knee/Hip      Knee/Hip Exercises: Aerobic   Nustep  Level 3 x 15 minutes moving forward x 2 to increase flexion.      Acupuncturist Location  Right knee.  Electrical Stimulation Action  IFC    Electrical Stimulation Parameters  1-10 Hz x 15 minutes.    Electrical Stimulation Goals  Edema;Pain      Vasopneumatic   Number Minutes Vasopneumatic   15 minutes    Vasopnuematic Location   --   Right knee.   Vasopneumatic Pressure  Low      Manual Therapy   Manual Therapy  Passive ROM    Passive ROM  PROM x 8 minutes in supine into flexion and extension with long holds.               PT Short Term Goals - 08/20/19 1502      PT SHORT TERM GOAL #1   Title  STG's=LTG's.        PT Long Term Goals - 09/02/19 1419      PT LONG TERM GOAL #1   Title  Independent with a HEP.    Time  4    Period  Weeks    Status  On-going      PT LONG TERM GOAL #2   Title  Full right active knee extension in order to normalize gait.    Time  4    Period  Weeks    Status  On-going   AROM -8 degrees 09/02/19     PT LONG TERM GOAL #3   Title  Active right knee flexion to 115 degrees+ so the patient can perform functional tasks and do so with pain not > 2-3/10.    Time  4    Period  Weeks    Status  On-going   AROM 85 degrees 09/02/19      PT LONG TERM GOAL #4   Title  Increase right knee strength to a solid 4+/5 to provide good stability for accomplishment of functional activities.    Time  4    Period  Weeks    Status  On-going      PT LONG TERM GOAL #5   Title  Perform a reciprocating stair gait with one railing with pain not > 2-3/10.    Time  4    Period  Weeks    Status  On-going            Plan - 09/10/19 1110    Clinical Impression Statement  Patient achieved 100 degrees of right knee passive flexion today.  She states was has been using her TENS unit at home but states "it stings".  I told her to discontinue use.    Personal Factors and Comorbidities  Comorbidity 1;Comorbidity 2    Comorbidities  OSA, TSA, OA, HTN, Anemia.    Examination-Activity Limitations  Other;Transfers;Locomotion Level;Stairs;Squat    Examination-Participation Restrictions  Personal Finances;Meal Prep    Stability/Clinical Decision Making  Stable/Uncomplicated    Rehab Potential  Good    PT Frequency  3x / week    PT Duration  4 weeks    PT Treatment/Interventions  ADLs/Self Care Home Management;Cryotherapy;Electrical Stimulation;Gait training;Stair training;Functional mobility training;Therapeutic activities;Therapeutic exercise;Neuromuscular re-education;Manual techniques;Patient/family education;Passive range of motion;Vasopneumatic Device    PT Next Visit Plan  Cont with POC for ROM and strengthening Vasopneumatic and electrical stimulation.    Consulted and Agree with Plan of Care  Patient       Patient will benefit from skilled therapeutic intervention in order to improve the following deficits and impairments:  Pain, Decreased activity tolerance, Decreased strength, Increased edema, Decreased range of motion  Visit Diagnosis: Stiffness of right knee,  not elsewhere classified  Chronic pain of right knee  Localized edema     Problem List Patient Active Problem List   Diagnosis Date Noted  . S/P left TKA  08/17/2019  . Status post total right knee replacement 08/17/2019  . OAB (overactive bladder) 06/22/2019  . ILD (interstitial lung disease) ([place]) 12/15/2018  . Enlarged heart 06/16/2018  . Morbid obesity ([place]) 10/17/2017  . 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 Hot flashes, menopausal 12/31/2016  . DDD (degenerative disc disease), lumbar 11/06/2015  . Right-sided low back pain without sciatica 06/21/2015  . Diastolic dysfunction without heart failure 11/13/2014  . Other abnormal glucose 05/27/2013  . Osteopenia of the elderly 05/27/2013  . Hyperlipidemia LDL goal <130 09/04/2012  . Esophageal stricture 09/12/2011  . Visit for screening mammogram 08/08/2011  . Routine general medical examination at a health care facility 08/08/2011  . Essential hypertension, benign 01/14/2011  . URINARY INCONTINENCE 12/14/2008  . OA (osteoarthritis) 07/21/2008  . Obstructive sleep apnea 10/14/2007  . GERD 10/14/2007   Progress Note Reporting Period 08/20/19 to 09/10/19  See note below for Objective Data and Assessment of Progress/Goals. Patient is progressing toward goals and achieved 100 degrees of passive right knee flexion today.     Lewis Grivas, [place] MPT 09/10/2019, 11:49 AM  [facility] [address] [place], [place], [address] Phone: [phone]   Fax:  [phone]  Name: KENISHA JEFFERY MRN: [id number] Date of Birth: 1941-05-15

## 2019-09-13 ENCOUNTER — Encounter: Payer: Self-pay | Admitting: Physical Therapy

## 2019-09-13 ENCOUNTER — Ambulatory Visit: Payer: Medicare Other | Admitting: Physical Therapy

## 2019-09-13 ENCOUNTER — Other Ambulatory Visit: Payer: Self-pay

## 2019-09-13 DIAGNOSIS — M6281 Muscle weakness (generalized): Secondary | ICD-10-CM | POA: Diagnosis not present

## 2019-09-13 DIAGNOSIS — M25661 Stiffness of right knee, not elsewhere classified: Secondary | ICD-10-CM

## 2019-09-13 DIAGNOSIS — M25561 Pain in right knee: Secondary | ICD-10-CM | POA: Diagnosis not present

## 2019-09-13 DIAGNOSIS — G8929 Other chronic pain: Secondary | ICD-10-CM

## 2019-09-13 DIAGNOSIS — R600 Localized edema: Secondary | ICD-10-CM | POA: Diagnosis not present

## 2019-09-13 DIAGNOSIS — R262 Difficulty in walking, not elsewhere classified: Secondary | ICD-10-CM | POA: Diagnosis not present

## 2019-09-13 NOTE — Therapy (Signed)
[facility] [place], [place], [address] Phone: [phone]   Fax:  [phone]  Physical Therapy Treatment  Patient Details  Name: Veronica Garza MRN: [id number] Date of Birth: May 31, 1941 Referring Provider (PT): Paralee Cancel MD   Encounter Date: 09/13/2019  PT End of Session - 09/13/19 1033    Visit Number  11    Number of Visits  16    Date for PT Re-Evaluation  11/18/19    Authorization Type  FOTO AT LEAST EVERY 5TH VISIT.  PROGRESS NOTE AT 10TH VISIT.  KX MODIFIER AFTER 15 VISITS.    PT Start Time  1032    PT Stop Time  1122    PT Time Calculation (min)  50 min    Activity Tolerance  Patient tolerated treatment well    Behavior During Therapy  WFL for tasks assessed/performed       Past Medical History:  Diagnosis Date  . Anemia   . Arthritis   . BCC (basal cell carcinoma) 07/19/2014   Right cheek  . Cataract   . Complication of anesthesia    takes patient a long time to wake up  . GERD (gastroesophageal reflux disease)   . Hx of shoulder replacement    lt  . Hypertension   . OSA (obstructive sleep apnea)    use CPAP  . Pneumonia    History of  . Urinary incontinence     Past Surgical History:  Procedure Laterality Date  . ABDOMINAL HYSTERECTOMY    . EYE SURGERY     bilateral cataract extraction  . FINGER SURGERY     cyst left middle finger  . JOINT REPLACEMENT     left shoulder  . NASAL SINUS SURGERY    . SHOULDER ARTHROSCOPY     Left  . TOTAL KNEE ARTHROPLASTY Right 08/17/2019   Procedure: TOTAL KNEE ARTHROPLASTY;  Surgeon: Paralee Cancel, MD;  Location: [facility];  Service: Orthopedics;  Laterality: Right;  70 mins  . TOTAL SHOULDER ARTHROPLASTY  10/10/2011   Procedure: TOTAL SHOULDER ARTHROPLASTY;  Surgeon: Metta Clines Supple;  Location: [facility];  Service: Orthopedics;  Laterality: Right;  . UPPER GASTROINTESTINAL ENDOSCOPY    . Vaginal Sling      There were no vitals filed for this  visit.  Subjective Assessment - 09/13/19 1032    Subjective  COVID-19 screen performed prior to patient entering clinic. Reports discomfort related to rainy weather.    Pertinent History  OSA, TSA, OA, HTN, Anemia.    Patient Stated Goals  Get back to normal life.    Currently in Pain?  Yes    Pain Score  5     Pain Location  Knee    Pain Orientation  Right    Pain Descriptors / Indicators  Discomfort    Pain Type  Surgical pain    Pain Onset  1 to 4 weeks ago    Pain Frequency  Constant         [facility] PT Assessment - 09/13/19 0001      Assessment   Medical Diagnosis  Right total knee replacement.    Referring Provider (PT)  Paralee Cancel MD    Onset Date/Surgical Date  08/17/19      Restrictions   Weight Bearing Restrictions  No                   [facility] Adult PT Treatment/Exercise - 09/13/19 0001      Knee/Hip  Exercises: Aerobic   Nustep  L4, seat 8-7 x15 min      Knee/Hip Exercises: Supine   Short Arc Quad Sets  AROM;Right;2 sets;10 reps      Modalities   Modalities  Management consultant  IFC    Electrical Stimulation Parameters  80-150 hz x15 min    Electrical Stimulation Goals  Edema;Pain      Vasopneumatic   Number Minutes Vasopneumatic   15 minutes    Vasopnuematic Location   Knee    Vasopneumatic Pressure  Low    Vasopneumatic Temperature   34      Manual Therapy   Manual Therapy  Passive ROM    Passive ROM  PROM of R knee into flexion with gentle holds               PT Short Term Goals - 08/20/19 1502      PT SHORT TERM GOAL #1   Title  STG's=LTG's.        PT Long Term Goals - 09/02/19 1419      PT LONG TERM GOAL #1   Title  Independent with a HEP.    Time  4    Period  Weeks    Status  On-going      PT LONG TERM GOAL #2   Title  Full right active knee extension in order to normalize gait.    Time  4     Period  Weeks    Status  On-going   AROM -8 degrees 09/02/19     PT LONG TERM GOAL #3   Title  Active right knee flexion to 115 degrees+ so the patient can perform functional tasks and do so with pain not > 2-3/10.    Time  4    Period  Weeks    Status  On-going   AROM 85 degrees 09/02/19     PT LONG TERM GOAL #4   Title  Increase right knee strength to a solid 4+/5 to provide good stability for accomplishment of functional activities.    Time  4    Period  Weeks    Status  On-going      PT LONG TERM GOAL #5   Title  Perform a reciprocating stair gait with one railing with pain not > 2-3/10.    Time  4    Period  Weeks    Status  On-going            Plan - 09/13/19 1111    Clinical Impression Statement  Patient arrived with reports of increased R knee pain due to rainy weather. Patient ambulating with lack of R knee and hip flexion. Patient continues to report fear in relations to progressing to [facility] for ambulation as well as returning to driving. Good healing of R knee incision observed today and suggested to allow more healing before using a scar ointment. Normal modalities response noted following removal of the modalities.    Personal Factors and Comorbidities  Comorbidity 1;Comorbidity 2    Comorbidities  OSA, TSA, OA, HTN, Anemia.    Examination-Activity Limitations  Other;Transfers;Locomotion Level;Stairs;Squat    Examination-Participation Restrictions  Personal Finances;Meal Prep    Stability/Clinical Decision Making  Stable/Uncomplicated    Rehab Potential  Good    PT Frequency  3x / week    PT Duration  4 weeks  PT Treatment/Interventions  ADLs/Self Care Home Management;Cryotherapy;Electrical Stimulation;Gait training;Stair training;Functional mobility training;Therapeutic activities;Therapeutic exercise;Neuromuscular re-education;Manual techniques;Patient/family education;Passive range of motion;Vasopneumatic Device    PT Next Visit Plan  Cont with POC for ROM  and strengthening Vasopneumatic and electrical stimulation.    Consulted and Agree with Plan of Care  Patient       Patient will benefit from skilled therapeutic intervention in order to improve the following deficits and impairments:  Pain, Decreased activity tolerance, Decreased strength, Increased edema, Decreased range of motion  Visit Diagnosis: Stiffness of right knee, not elsewhere classified  Chronic pain of right knee  Localized edema  Muscle weakness (generalized)     Problem List Patient Active Problem List   Diagnosis Date Noted  . S/P left TKA 08/17/2019  . Status post total right knee replacement 08/17/2019  . OAB (overactive bladder) 06/22/2019  . ILD (interstitial lung disease) ([place]) 12/15/2018  . Enlarged heart 06/16/2018  . Morbid obesity ([place]) 10/17/2017  . 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 Hot flashes, menopausal 12/31/2016  . DDD (degenerative disc disease), lumbar 11/06/2015  . Right-sided low back pain without sciatica 06/21/2015  . Diastolic dysfunction without heart failure 11/13/2014  . Other abnormal glucose 05/27/2013  . Osteopenia of the elderly 05/27/2013  . Hyperlipidemia LDL goal <130 09/04/2012  . Esophageal stricture 09/12/2011  . Visit for screening mammogram 08/08/2011  . Routine general medical examination at a health care facility 08/08/2011  . Essential hypertension, benign 01/14/2011  . URINARY INCONTINENCE 12/14/2008  . OA (osteoarthritis) 07/21/2008  . Obstructive sleep apnea 10/14/2007  . GERD 10/14/2007    Standley Brooking, PTA 09/13/2019, 11:55 AM  [facility] [address] [place], [place], [address] Phone: [phone]   Fax:  [phone]  Name: Veronica Garza MRN: [id number] Date of Birth: 1941/01/24

## 2019-09-15 ENCOUNTER — Encounter: Payer: Medicare Other | Admitting: Physical Therapy

## 2019-09-15 DIAGNOSIS — Z96651 Presence of right artificial knee joint: Secondary | ICD-10-CM | POA: Diagnosis not present

## 2019-09-15 DIAGNOSIS — Z471 Aftercare following joint replacement surgery: Secondary | ICD-10-CM | POA: Diagnosis not present

## 2019-09-15 DIAGNOSIS — R0781 Pleurodynia: Secondary | ICD-10-CM | POA: Diagnosis not present

## 2019-09-17 ENCOUNTER — Ambulatory Visit: Payer: Medicare Other | Admitting: Physical Therapy

## 2019-09-17 ENCOUNTER — Other Ambulatory Visit: Payer: Self-pay

## 2019-09-17 ENCOUNTER — Encounter: Payer: Self-pay | Admitting: Physical Therapy

## 2019-09-17 DIAGNOSIS — G8929 Other chronic pain: Secondary | ICD-10-CM | POA: Diagnosis not present

## 2019-09-17 DIAGNOSIS — M25561 Pain in right knee: Secondary | ICD-10-CM | POA: Diagnosis not present

## 2019-09-17 DIAGNOSIS — M25661 Stiffness of right knee, not elsewhere classified: Secondary | ICD-10-CM | POA: Diagnosis not present

## 2019-09-17 DIAGNOSIS — M6281 Muscle weakness (generalized): Secondary | ICD-10-CM | POA: Diagnosis not present

## 2019-09-17 DIAGNOSIS — R600 Localized edema: Secondary | ICD-10-CM | POA: Diagnosis not present

## 2019-09-17 DIAGNOSIS — R262 Difficulty in walking, not elsewhere classified: Secondary | ICD-10-CM | POA: Diagnosis not present

## 2019-09-17 NOTE — Therapy (Signed)
[facility] [place], [place], [address] Phone: [phone]   Fax:  [phone]  Physical Therapy Treatment  Patient Details  Name: Veronica Garza MRN: [id number] Date of Birth: 04-13-1941 Referring Provider (PT): Paralee Cancel MD   Encounter Date: 09/17/2019  PT End of Session - 09/17/19 1052    Visit Number  12    Number of Visits  16    Date for PT Re-Evaluation  11/18/19    Authorization Type  FOTO AT LEAST EVERY 5TH VISIT.  PROGRESS NOTE AT 10TH VISIT.  KX MODIFIER AFTER 15 VISITS.    PT Start Time  1033    PT Stop Time  1127    PT Time Calculation (min)  54 min    Equipment Utilized During Treatment  Other (comment)   FWW   Activity Tolerance  Patient tolerated treatment well    Behavior During Therapy  WFL for tasks assessed/performed       Past Medical History:  Diagnosis Date  . Anemia   . Arthritis   . BCC (basal cell carcinoma) 07/19/2014   Right cheek  . Cataract   . Complication of anesthesia    takes patient a long time to wake up  . GERD (gastroesophageal reflux disease)   . Hx of shoulder replacement    lt  . Hypertension   . OSA (obstructive sleep apnea)    use CPAP  . Pneumonia    History of  . Urinary incontinence     Past Surgical History:  Procedure Laterality Date  . ABDOMINAL HYSTERECTOMY    . EYE SURGERY     bilateral cataract extraction  . FINGER SURGERY     cyst left middle finger  . JOINT REPLACEMENT     left shoulder  . NASAL SINUS SURGERY    . SHOULDER ARTHROSCOPY     Left  . TOTAL KNEE ARTHROPLASTY Right 08/17/2019   Procedure: TOTAL KNEE ARTHROPLASTY;  Surgeon: Paralee Cancel, MD;  Location: [facility];  Service: Orthopedics;  Laterality: Right;  70 mins  . TOTAL SHOULDER ARTHROPLASTY  10/10/2011   Procedure: TOTAL SHOULDER ARTHROPLASTY;  Surgeon: Metta Clines Supple;  Location: [place];  Service: Orthopedics;  Laterality: Right;  . UPPER GASTROINTESTINAL ENDOSCOPY    . Vaginal  Sling      There were no vitals filed for this visit.  Subjective Assessment - 09/17/19 1039    Subjective  COVID 19 screening performed on patient upon arrival. Reports increased muscle spasms since surgery and PA was very pleased at her ROM. Repors her ribs may be bruised as well as she hurts along R flank.    Pertinent History  OSA, TSA, OA, HTN, Anemia.    Patient Stated Goals  Get back to normal life.    Currently in Pain?  Yes    Pain Score  7     Pain Location  Knee    Pain Orientation  Right    Pain Descriptors / Indicators  Discomfort;Spasm    Pain Type  Surgical pain    Pain Onset  1 to 4 weeks ago    Pain Frequency  Constant         [facility] PT Assessment - 09/17/19 0001      Assessment   Medical Diagnosis  Right total knee replacement.    Referring Provider (PT)  Paralee Cancel MD    Onset Date/Surgical Date  08/17/19      Restrictions   Weight Bearing  Restrictions  No                   [facility] Adult PT Treatment/Exercise - 09/17/19 0001      Knee/Hip Exercises: Aerobic   Nustep  L3, seat 5 x15 min      Knee/Hip Exercises: Supine   Short Arc Quad Sets  AROM;Right;2 sets;10 reps    Heel Slides  AROM;Right;20 reps      Modalities   Modalities  Management consultant  IFC    Electrical Stimulation Parameters  80-150 hz x15 min    Electrical Stimulation Goals  Edema;Pain      Vasopneumatic   Number Minutes Vasopneumatic   15 minutes    Vasopnuematic Location   Knee    Vasopneumatic Pressure  Low    Vasopneumatic Temperature   34      Manual Therapy   Manual Therapy  Passive ROM    Passive ROM  PROM of R knee into flexion with gentle holds               PT Short Term Goals - 08/20/19 1502      PT SHORT TERM GOAL #1   Title  STG's=LTG's.        PT Long Term Goals - 09/02/19 1419      PT LONG TERM GOAL #1    Title  Independent with a HEP.    Time  4    Period  Weeks    Status  On-going      PT LONG TERM GOAL #2   Title  Full right active knee extension in order to normalize gait.    Time  4    Period  Weeks    Status  On-going   AROM -8 degrees 09/02/19     PT LONG TERM GOAL #3   Title  Active right knee flexion to 115 degrees+ so the patient can perform functional tasks and do so with pain not > 2-3/10.    Time  4    Period  Weeks    Status  On-going   AROM 85 degrees 09/02/19     PT LONG TERM GOAL #4   Title  Increase right knee strength to a solid 4+/5 to provide good stability for accomplishment of functional activities.    Time  4    Period  Weeks    Status  On-going      PT LONG TERM GOAL #5   Title  Perform a reciprocating stair gait with one railing with pain not > 2-3/10.    Time  4    Period  Weeks    Status  On-going            Plan - 09/17/19 1238    Clinical Impression Statement  Patient presented in clinic with reports of continued muscle spasms but taking spasm medication as directed. Light strengthening exercises completed with intermittant spasms experienced by patient. Patient able to demonstrate great ROM of R knee during all therex. Good incision closure and healing at this time. Patient has began using vitamin E over incision per MD recommendation. Normal modalities response noted following removal of the modalities.    Personal Factors and Comorbidities  Comorbidity 1;Comorbidity 2    Comorbidities  OSA, TSA, OA, HTN, Anemia.    Examination-Activity Limitations  Other;Transfers;Locomotion Level;Stairs;Squat    Examination-Participation  Restrictions  Personal Finances;Meal Prep    Stability/Clinical Decision Making  Stable/Uncomplicated    Rehab Potential  Good    PT Frequency  3x / week    PT Duration  4 weeks    PT Treatment/Interventions  ADLs/Self Care Home Management;Cryotherapy;Electrical Stimulation;Gait training;Stair training;Functional mobility  training;Therapeutic activities;Therapeutic exercise;Neuromuscular re-education;Manual techniques;Patient/family education;Passive range of motion;Vasopneumatic Device    PT Next Visit Plan  Cont with POC for ROM and strengthening Vasopneumatic and electrical stimulation.    Consulted and Agree with Plan of Care  Patient       Patient will benefit from skilled therapeutic intervention in order to improve the following deficits and impairments:  Pain, Decreased activity tolerance, Decreased strength, Increased edema, Decreased range of motion  Visit Diagnosis: Stiffness of right knee, not elsewhere classified  Chronic pain of right knee  Localized edema  Muscle weakness (generalized)  Difficulty in walking, not elsewhere classified     Problem List Patient Active Problem List   Diagnosis Date Noted  . S/P left TKA 08/17/2019  . Status post total right knee replacement 08/17/2019  . OAB (overactive bladder) 06/22/2019  . ILD (interstitial lung disease) ([place]) 12/15/2018  . Enlarged heart 06/16/2018  . Morbid obesity ([place]) 10/17/2017  . 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 Hot flashes, menopausal 12/31/2016  . DDD (degenerative disc disease), lumbar 11/06/2015  . Right-sided low back pain without sciatica 06/21/2015  . Diastolic dysfunction without heart failure 11/13/2014  . Other abnormal glucose 05/27/2013  . Osteopenia of the elderly 05/27/2013  . Hyperlipidemia LDL goal <130 09/04/2012  . Esophageal stricture 09/12/2011  . Visit for screening mammogram 08/08/2011  . Routine general medical examination at a health care facility 08/08/2011  . Essential hypertension, benign 01/14/2011  . URINARY INCONTINENCE 12/14/2008  . OA (osteoarthritis) 07/21/2008  . Obstructive sleep apnea 10/14/2007  . GERD 10/14/2007    Standley Brooking, PTA 09/17/2019, 12:44 PM  [facility] [address] [place], [place], [address] Phone:  [phone]   Fax:  [phone]  Name: Veronica Garza MRN: [id number] Date of Birth: May 06, 1941

## 2019-09-20 ENCOUNTER — Ambulatory Visit: Payer: Medicare Other | Admitting: Physical Therapy

## 2019-09-22 ENCOUNTER — Encounter: Payer: Medicare Other | Admitting: Physical Therapy

## 2019-09-23 ENCOUNTER — Ambulatory Visit: Payer: Self-pay | Admitting: *Deleted

## 2019-09-23 NOTE — Telephone Encounter (Signed)
Patient calls reports she is covid19 positive. She and husband have been isolating since Sunday. She has cold sxs including non productive cough, runny nose and chest congestion. Temperature fluctuating btw 98-100.1.Denies SOB/CP/N/V. She is able to eat and drink and void without any difficulty. Encouraged rest and increased fluids. OTC recommendations for all symptoms including tylenol, mucinex, delsym and saline spray. Any SOB noticed call back immediately or seek treatment at the ED. Reason for Disposition . Cold with no complications  Answer Assessment - Initial Assessment Questions 1. ONSET: "When did the nasal discharge start?"     Congestion, runny nose, cough, decreased appetite 2. AMOUNT: "How much discharge is there?"     none 3. COUGH: "Do you have a cough?" If yes, ask: "Describe the color of your sputum" (clear, white, yellow, green)    yes 4. RESPIRATORY DISTRESS: "Describe your breathing."      none 5. FEVER: "Do you have a fever?" If so, ask: "What is your temperature, how was it measured, and when did it start?"     98 -100.1 6. SEVERITY: "Overall, how bad are you feeling right now?" (e.g., doesn't interfere with normal activities, staying home from school/work, staying in bed)      Staying home 7. OTHER SYMPTOMS: "Do you have any other symptoms?" (e.g., sore throat, earache, wheezing, vomiting)    None of these 8. PREGNANCY: "Is there any chance you are pregnant?" "When was your last menstrual period?"    na  Protocols used: COMMON COLD-A-AH

## 2019-09-24 ENCOUNTER — Encounter: Payer: Medicare Other | Admitting: Physical Therapy

## 2019-10-04 ENCOUNTER — Encounter: Payer: Self-pay | Admitting: Physical Therapy

## 2019-10-04 ENCOUNTER — Other Ambulatory Visit: Payer: Self-pay

## 2019-10-04 ENCOUNTER — Ambulatory Visit: Payer: Medicare Other | Attending: Orthopedic Surgery | Admitting: Physical Therapy

## 2019-10-04 DIAGNOSIS — G8929 Other chronic pain: Secondary | ICD-10-CM

## 2019-10-04 DIAGNOSIS — M25661 Stiffness of right knee, not elsewhere classified: Secondary | ICD-10-CM

## 2019-10-04 DIAGNOSIS — M25561 Pain in right knee: Secondary | ICD-10-CM | POA: Diagnosis not present

## 2019-10-04 DIAGNOSIS — M6281 Muscle weakness (generalized): Secondary | ICD-10-CM | POA: Insufficient documentation

## 2019-10-04 DIAGNOSIS — R600 Localized edema: Secondary | ICD-10-CM | POA: Diagnosis not present

## 2019-10-04 NOTE — Therapy (Signed)
[facility] [place], [place], [address] Phone: [phone]   Fax:  [phone]  Physical Therapy Treatment  Patient Details  Name: Veronica Garza MRN: [id number] Date of Birth: [birth year] Referring Provider (PT): Paralee Cancel MD   Encounter Date: 10/04/2019  PT End of Session - 10/04/19 1101    Visit Number  13    Number of Visits  16    Date for PT Re-Evaluation  11/18/19    Authorization Type  FOTO AT LEAST EVERY 5TH VISIT.  PROGRESS NOTE AT 10TH VISIT.  KX MODIFIER AFTER 15 VISITS.    PT Start Time  1032    PT Stop Time  1114    PT Time Calculation (min)  42 min    Activity Tolerance  Patient tolerated treatment well   limited from weakness after being sick   Behavior During Therapy  [facility] for tasks assessed/performed       Past Medical History:  Diagnosis Date  . Anemia   . Arthritis   . BCC (basal cell carcinoma) 07/19/2014   Right cheek  . Cataract   . Complication of anesthesia    takes patient a long time to wake up  . GERD (gastroesophageal reflux disease)   . Hx of shoulder replacement    lt  . Hypertension   . OSA (obstructive sleep apnea)    use CPAP  . Pneumonia    History of  . Urinary incontinence     Past Surgical History:  Procedure Laterality Date  . ABDOMINAL HYSTERECTOMY    . EYE SURGERY     bilateral cataract extraction  . FINGER SURGERY     cyst left middle finger  . JOINT REPLACEMENT     left shoulder  . NASAL SINUS SURGERY    . SHOULDER ARTHROSCOPY     Left  . TOTAL KNEE ARTHROPLASTY Right 08/17/2019   Procedure: TOTAL KNEE ARTHROPLASTY;  Surgeon: Paralee Cancel, MD;  Location: [facility];  Service: Orthopedics;  Laterality: Right;  70 mins  . TOTAL SHOULDER ARTHROPLASTY  10/10/2011   Procedure: TOTAL SHOULDER ARTHROPLASTY;  Surgeon: Metta Clines Supple;  Location: [place];  Service: Orthopedics;  Laterality: Right;  . UPPER GASTROINTESTINAL ENDOSCOPY    . Vaginal Sling      There  were no vitals filed for this visit.  Subjective Assessment - 10/04/19 1038    Subjective  COVID 19 screening performed on patient upon arrival. Patient reported feeling very weak after being sick    Pertinent History  OSA, TSA, OA, HTN, Anemia.    Patient Stated Goals  Get back to normal life.    Currently in Pain?  Yes    Pain Score  7     Pain Location  Knee    Pain Orientation  Right    Pain Descriptors / Indicators  Discomfort    Pain Type  Surgical pain    Pain Onset  More than a month ago    Pain Frequency  Constant    Aggravating Factors   prolong activity    Pain Relieving Factors  rest         [facility] PT Assessment - 10/04/19 0001      AROM   AROM Assessment Site  Knee    Right/Left Knee  Right    Right Knee Extension  -5    Right Knee Flexion  103      PROM   PROM Assessment Site  Knee  Right/Left Knee  Right    Right Knee Extension  0    Right Knee Flexion  115                   [facility] Adult PT Treatment/Exercise - 10/04/19 0001      Knee/Hip Exercises: Aerobic   Nustep  64min L3 UE/LE      Knee/Hip Exercises: Seated   Long Arc Quad  Strengthening;Right;2 sets;10 reps      Knee/Hip Exercises: Supine   Straight Leg Raises  Strengthening;Right;10 reps;2 sets      Acupuncturist Location  R knee    Printmaker Action  IFC    Electrical Stimulation Parameters  80-150hz  x3min    Electrical Stimulation Goals  Edema;Pain      Vasopneumatic   Number Minutes Vasopneumatic   15 minutes    Vasopnuematic Location   Knee    Vasopneumatic Pressure  Low    Vasopneumatic Temperature   34      Manual Therapy   Manual Therapy  Passive ROM    Passive ROM  PROM of R knee into flexion with gentle holds               PT Short Term Goals - 08/20/19 1502      PT SHORT TERM GOAL #1   Title  STG's=LTG's.        PT Long Term Goals - 10/04/19 1107      PT LONG TERM GOAL #1   Title  Independent  with a HEP.    Time  4    Period  Weeks    Status  On-going      PT LONG TERM GOAL #2   Title  Full right active knee extension in order to normalize gait.    Time  4    Period  Weeks    Status  On-going   AROM -5 degrees 10/04/19     PT LONG TERM GOAL #3   Title  Active right knee flexion to 115 degrees+ so the patient can perform functional tasks and do so with pain not > 2-3/10.    Time  4    Period  Weeks    Status  On-going   AROM 103 degrees 10/04/19     PT LONG TERM GOAL #4   Title  Increase right knee strength to a solid 4+/5 to provide good stability for accomplishment of functional activities.    Time  4    Period  Weeks    Status  On-going   NT 10/04/19     PT LONG TERM GOAL #5   Title  Perform a reciprocating stair gait with one railing with pain not > 2-3/10.    Time  4    Period  Weeks    Status  On-going            Plan - 10/04/19 1103    Clinical Impression Statement  Patient tolerated treatment fair due to being very weak from sickness. Patient has been doing HEP and has improved ROM in right knee with flexion/ext today. Patient able to perform gentle strengthening yet limited due to weakness. Current goals progressing.    Personal Factors and Comorbidities  Comorbidity 1;Comorbidity 2    Comorbidities  OSA, TSA, OA, HTN, Anemia.    Examination-Activity Limitations  Other;Transfers;Locomotion Level;Stairs;Squat    Examination-Participation Restrictions  Personal Finances;Meal Prep    Stability/Clinical Decision Making  Stable/Uncomplicated  Rehab Potential  Good    PT Frequency  3x / week    PT Duration  4 weeks    PT Treatment/Interventions  ADLs/Self Care Home Management;Cryotherapy;Electrical Stimulation;Gait training;Stair training;Functional mobility training;Therapeutic activities;Therapeutic exercise;Neuromuscular re-education;Manual techniques;Patient/family education;Passive range of motion;Vasopneumatic Device    PT Next Visit Plan  Cont  with POC for ROM and strengthening Vasopneumatic and electrical stimulation.    Consulted and Agree with Plan of Care  Patient       Patient will benefit from skilled therapeutic intervention in order to improve the following deficits and impairments:  Pain, Decreased activity tolerance, Decreased strength, Increased edema, Decreased range of motion  Visit Diagnosis: Stiffness of right knee, not elsewhere classified  Chronic pain of right knee  Localized edema     Problem List Patient Active Problem List   Diagnosis Date Noted  . S/P left TKA 08/17/2019  . Status post total right knee replacement 08/17/2019  . OAB (overactive bladder) 06/22/2019  . ILD (interstitial lung disease) ([place]) 12/15/2018  . Enlarged heart 06/16/2018  . Morbid obesity ([place]) 10/17/2017  . 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 Hot flashes, menopausal 12/31/2016  . DDD (degenerative disc disease), lumbar 11/06/2015  . Right-sided low back pain without sciatica 06/21/2015  . Diastolic dysfunction without heart failure 11/13/2014  . Other abnormal glucose 05/27/2013  . Osteopenia of the elderly 05/27/2013  . Hyperlipidemia LDL goal <130 09/04/2012  . Esophageal stricture 09/12/2011  . Visit for screening mammogram 08/08/2011  . Routine general medical examination at a health care facility 08/08/2011  . Essential hypertension, benign 01/14/2011  . URINARY INCONTINENCE 12/14/2008  . OA (osteoarthritis) 07/21/2008  . Obstructive sleep apnea 10/14/2007  . GERD 10/14/2007    DUNFORD, CHRISTINA P, PTA 10/04/2019, 11:20 AM  [facility] [place], [place], [address] Phone: [phone]   Fax:  [phone]  Name: Veronica Garza MRN: [id number] Date of Birth: 1941/01/06

## 2019-10-06 ENCOUNTER — Other Ambulatory Visit: Payer: Self-pay

## 2019-10-06 ENCOUNTER — Encounter: Payer: Self-pay | Admitting: Physical Therapy

## 2019-10-06 ENCOUNTER — Ambulatory Visit: Payer: Medicare Other | Admitting: Physical Therapy

## 2019-10-06 DIAGNOSIS — M6281 Muscle weakness (generalized): Secondary | ICD-10-CM | POA: Diagnosis not present

## 2019-10-06 DIAGNOSIS — M25661 Stiffness of right knee, not elsewhere classified: Secondary | ICD-10-CM | POA: Diagnosis not present

## 2019-10-06 DIAGNOSIS — R600 Localized edema: Secondary | ICD-10-CM | POA: Diagnosis not present

## 2019-10-06 DIAGNOSIS — G8929 Other chronic pain: Secondary | ICD-10-CM

## 2019-10-06 DIAGNOSIS — M25561 Pain in right knee: Secondary | ICD-10-CM | POA: Diagnosis not present

## 2019-10-06 NOTE — Therapy (Signed)
[facility] [place], [place], [address] Phone: [phone]   Fax:  [phone]  Physical Therapy Treatment  Patient Details  Name: Veronica Garza MRN: [id number] Date of Birth: 1941-06-21 Referring Provider (PT): Paralee Cancel MD   Encounter Date: 10/06/2019  PT End of Session - 10/06/19 1040    Visit Number  14    Number of Visits  16    Date for PT Re-Evaluation  11/18/19    Authorization Type  FOTO AT LEAST EVERY 5TH VISIT.  PROGRESS NOTE AT 10TH VISIT.  KX MODIFIER AFTER 15 VISITS.    PT Start Time  1033    PT Stop Time  1122    PT Time Calculation (min)  49 min    Equipment Utilized During Treatment  Other (comment)   SPC   Activity Tolerance  Patient tolerated treatment well    Behavior During Therapy  WFL for tasks assessed/performed       Past Medical History:  Diagnosis Date  . Anemia   . Arthritis   . BCC (basal cell carcinoma) 07/19/2014   Right cheek  . Cataract   . Complication of anesthesia    takes patient a long time to wake up  . GERD (gastroesophageal reflux disease)   . Hx of shoulder replacement    lt  . Hypertension   . OSA (obstructive sleep apnea)    use CPAP  . Pneumonia    History of  . Urinary incontinence     Past Surgical History:  Procedure Laterality Date  . ABDOMINAL HYSTERECTOMY    . EYE SURGERY     bilateral cataract extraction  . FINGER SURGERY     cyst left middle finger  . JOINT REPLACEMENT     left shoulder  . NASAL SINUS SURGERY    . SHOULDER ARTHROSCOPY     Left  . TOTAL KNEE ARTHROPLASTY Right 08/17/2019   Procedure: TOTAL KNEE ARTHROPLASTY;  Surgeon: Paralee Cancel, MD;  Location: [facility];  Service: Orthopedics;  Laterality: Right;  70 mins  . TOTAL SHOULDER ARTHROPLASTY  10/10/2011   Procedure: TOTAL SHOULDER ARTHROPLASTY;  Surgeon: Metta Clines Supple;  Location: [place];  Service: Orthopedics;  Laterality: Right;  . UPPER GASTROINTESTINAL ENDOSCOPY    . Vaginal  Sling      There were no vitals filed for this visit.  Subjective Assessment - 10/06/19 1040    Subjective  COVID 19 screening performed on patient upon arrival. Reports she really worked on her exercises while she was out. Feels as if she is finally getting her strength back after being sick.    Pertinent History  OSA, TSA, OA, HTN, Anemia.    Patient Stated Goals  Get back to normal life.    Currently in Pain?  --   No pain assessment provided   Pain Location  --    Pain Orientation  --         [facility] PT Assessment - 10/06/19 0001      Assessment   Medical Diagnosis  Right total knee replacement.    Referring Provider (PT)  Paralee Cancel MD    Onset Date/Surgical Date  08/17/19    Next MD Visit  10/14/2019      Restrictions   Weight Bearing Restrictions  No      ROM / Strength   AROM / PROM / Strength  AROM      AROM   Overall AROM   Within functional  limits for tasks performed    AROM Assessment Site  Knee    Right/Left Knee  Right    Right Knee Extension  4    Right Knee Flexion  117                   [facility] Adult PT Treatment/Exercise - 10/06/19 0001      Knee/Hip Exercises: Aerobic   Nustep  L4 x10 min      Knee/Hip Exercises: Standing   Hip Abduction  AROM;Right;2 sets;10 reps;Knee straight    Forward Step Up  Right;2 sets;10 reps;Hand Hold: 2;Step Height: 6"      Knee/Hip Exercises: Seated   Long Arc Quad  Strengthening;Right;2 sets;10 reps    Long Arc Quad Weight  3 lbs.      Modalities   Modalities  Management consultant  IFC    Electrical Stimulation Parameters  80-150 hz x15 min    Electrical Stimulation Goals  Edema;Pain      Vasopneumatic   Number Minutes Vasopneumatic   15 minutes    Vasopnuematic Location   Knee    Vasopneumatic Pressure  Low    Vasopneumatic Temperature   34      Manual Therapy   Manual  Therapy  Passive ROM    Passive ROM  PROM of R knee into flexion with gentle holds               PT Short Term Goals - 08/20/19 1502      PT SHORT TERM GOAL #1   Title  STG's=LTG's.        PT Long Term Goals - 10/04/19 1107      PT LONG TERM GOAL #1   Title  Independent with a HEP.    Time  4    Period  Weeks    Status  On-going      PT LONG TERM GOAL #2   Title  Full right active knee extension in order to normalize gait.    Time  4    Period  Weeks    Status  On-going   AROM -5 degrees 10/04/19     PT LONG TERM GOAL #3   Title  Active right knee flexion to 115 degrees+ so the patient can perform functional tasks and do so with pain not > 2-3/10.    Time  4    Period  Weeks    Status  On-going   AROM 103 degrees 10/04/19     PT LONG TERM GOAL #4   Title  Increase right knee strength to a solid 4+/5 to provide good stability for accomplishment of functional activities.    Time  4    Period  Weeks    Status  On-going   NT 10/04/19     PT LONG TERM GOAL #5   Title  Perform a reciprocating stair gait with one railing with pain not > 2-3/10.    Time  4    Period  Weeks    Status  On-going            Plan - 10/06/19 1231    Clinical Impression Statement  Patient has progressed very well even after being out of PT for two weeks due to sickness. Patient able to complete therex as directed with no abnormal compensatory strategies. Only minimal antalgic gait deviation  noted during ambulation within clinic. Patient able to achieve 117 deg knee flexion. Normal modalities response noted following removal of the modalities.    Personal Factors and Comorbidities  Comorbidity 1;Comorbidity 2    Comorbidities  OSA, TSA, OA, HTN, Anemia.    Examination-Activity Limitations  Other;Transfers;Locomotion Level;Stairs;Squat    Stability/Clinical Decision Making  Stable/Uncomplicated    Rehab Potential  Good    PT Frequency  3x / week    PT Duration  4 weeks    PT  Treatment/Interventions  ADLs/Self Care Home Management;Cryotherapy;Electrical Stimulation;Gait training;Stair training;Functional mobility training;Therapeutic activities;Therapeutic exercise;Neuromuscular re-education;Manual techniques;Patient/family education;Passive range of motion;Vasopneumatic Device    PT Next Visit Plan  Cont with POC for ROM and strengthening Vasopneumatic and electrical stimulation.    Consulted and Agree with Plan of Care  Patient       Patient will benefit from skilled therapeutic intervention in order to improve the following deficits and impairments:  Pain, Decreased activity tolerance, Decreased strength, Increased edema, Decreased range of motion  Visit Diagnosis: Stiffness of right knee, not elsewhere classified  Chronic pain of right knee  Localized edema  Muscle weakness (generalized)     Problem List Patient Active Problem List   Diagnosis Date Noted  . S/P left TKA 08/17/2019  . Status post total right knee replacement 08/17/2019  . OAB (overactive bladder) 06/22/2019  . ILD (interstitial lung disease) ([place]) 12/15/2018  . Enlarged heart 06/16/2018  . Morbid obesity ([place]) 10/17/2017  . 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 Hot flashes, menopausal 12/31/2016  . DDD (degenerative disc disease), lumbar 11/06/2015  . Right-sided low back pain without sciatica 06/21/2015  . Diastolic dysfunction without heart failure 11/13/2014  . Other abnormal glucose 05/27/2013  . Osteopenia of the elderly 05/27/2013  . Hyperlipidemia LDL goal <130 09/04/2012  . Esophageal stricture 09/12/2011  . Visit for screening mammogram 08/08/2011  . Routine general medical examination at a health care facility 08/08/2011  . Essential hypertension, benign 01/14/2011  . URINARY INCONTINENCE 12/14/2008  . OA (osteoarthritis) 07/21/2008  . Obstructive sleep apnea 10/14/2007  . GERD 10/14/2007    Standley Brooking, PTA 10/06/2019, 12:36 PM  [facility] [address] [place], [place], [address] Phone: [phone]   Fax:  [phone]  Name: DAIZAH PERISHO MRN: [id number] Date of Birth: December 05, 1940

## 2019-10-08 ENCOUNTER — Encounter: Payer: Self-pay | Admitting: Physical Therapy

## 2019-10-08 ENCOUNTER — Other Ambulatory Visit: Payer: Self-pay

## 2019-10-08 ENCOUNTER — Ambulatory Visit: Payer: Medicare Other | Admitting: Physical Therapy

## 2019-10-08 DIAGNOSIS — R600 Localized edema: Secondary | ICD-10-CM | POA: Diagnosis not present

## 2019-10-08 DIAGNOSIS — M25661 Stiffness of right knee, not elsewhere classified: Secondary | ICD-10-CM | POA: Diagnosis not present

## 2019-10-08 DIAGNOSIS — G8929 Other chronic pain: Secondary | ICD-10-CM

## 2019-10-08 DIAGNOSIS — M25561 Pain in right knee: Secondary | ICD-10-CM

## 2019-10-08 DIAGNOSIS — M6281 Muscle weakness (generalized): Secondary | ICD-10-CM

## 2019-10-08 NOTE — Therapy (Signed)
[facility] [place], [place], [address] Phone: [phone]   Fax:  [phone]  Physical Therapy Treatment  Patient Details  Name: Veronica Garza MRN: [id number] Date of Birth: 03-14-1941 Referring Provider (PT): Paralee Cancel MD   Encounter Date: 10/08/2019  PT End of Session - 10/08/19 1235    Visit Number  15    Number of Visits  16    Date for PT Re-Evaluation  11/18/19    Authorization Type  FOTO AT LEAST EVERY 5TH VISIT.  PROGRESS NOTE AT 10TH VISIT.  KX MODIFIER AFTER 15 VISITS.    PT Start Time  1030    PT Stop Time  1120    PT Time Calculation (min)  50 min    Equipment Utilized During Treatment  Other (comment)   SPC   Activity Tolerance  Patient tolerated treatment well    Behavior During Therapy  WFL for tasks assessed/performed       Past Medical History:  Diagnosis Date  . Anemia   . Arthritis   . BCC (basal cell carcinoma) 07/19/2014   Right cheek  . Cataract   . Complication of anesthesia    takes patient a long time to wake up  . GERD (gastroesophageal reflux disease)   . Hx of shoulder replacement    lt  . Hypertension   . OSA (obstructive sleep apnea)    use CPAP  . Pneumonia    History of  . Urinary incontinence     Past Surgical History:  Procedure Laterality Date  . ABDOMINAL HYSTERECTOMY    . EYE SURGERY     bilateral cataract extraction  . FINGER SURGERY     cyst left middle finger  . JOINT REPLACEMENT     left shoulder  . NASAL SINUS SURGERY    . SHOULDER ARTHROSCOPY     Left  . TOTAL KNEE ARTHROPLASTY Right 08/17/2019   Procedure: TOTAL KNEE ARTHROPLASTY;  Surgeon: Paralee Cancel, MD;  Location: [facility];  Service: Orthopedics;  Laterality: Right;  70 mins  . TOTAL SHOULDER ARTHROPLASTY  10/10/2011   Procedure: TOTAL SHOULDER ARTHROPLASTY;  Surgeon: Metta Clines Supple;  Location: [place];  Service: Orthopedics;  Laterality: Right;  . UPPER GASTROINTESTINAL ENDOSCOPY    . Vaginal  Sling      There were no vitals filed for this visit.  Subjective Assessment - 10/08/19 1040    Subjective  COVID 19 screening completed in clinic upon arrival. Reports R knee feels fair.    Pertinent History  OSA, TSA, OA, HTN, Anemia.    Patient Stated Goals  Get back to normal life.    Currently in Pain?  Other (Comment)   No pain assessment provided by patient        [facility] PT Assessment - 10/08/19 0001      Assessment   Medical Diagnosis  Right total knee replacement.    Referring Provider (PT)  Paralee Cancel MD    Onset Date/Surgical Date  08/17/19    Next MD Visit  10/14/2019      Restrictions   Weight Bearing Restrictions  No                   [facility] Adult PT Treatment/Exercise - 10/08/19 0001      Knee/Hip Exercises: Aerobic   Nustep  L5 x10 min      Knee/Hip Exercises: Standing   Hip Abduction  AROM;Right;2 sets;10 reps;Knee straight    Forward  Step Up  Right;2 sets;10 reps;Hand Hold: 2;Step Height: 6"      Knee/Hip Exercises: Seated   Long Arc Quad  Strengthening;Right;2 sets;10 reps    Long Arc Quad Weight  3 lbs.    Sit to Sand  15 reps;without UE support      Knee/Hip Exercises: Supine   Straight Leg Raises  AROM;Right;2 sets;10 reps      Modalities   Modalities  Management consultant  IFC    Electrical Stimulation Parameters  80-150 hz x15 min    Electrical Stimulation Goals  Edema;Pain      Vasopneumatic   Number Minutes Vasopneumatic   15 minutes    Vasopnuematic Location   Knee    Vasopneumatic Pressure  Low    Vasopneumatic Temperature   34      Manual Therapy   Manual Therapy  Passive ROM    Passive ROM  PROM of R knee into extension with gentle holds at end range               PT Short Term Goals - 08/20/19 1502      PT SHORT TERM GOAL #1   Title  STG's=LTG's.        PT Long Term Goals -  10/04/19 1107      PT LONG TERM GOAL #1   Title  Independent with a HEP.    Time  4    Period  Weeks    Status  On-going      PT LONG TERM GOAL #2   Title  Full right active knee extension in order to normalize gait.    Time  4    Period  Weeks    Status  On-going   AROM -5 degrees 10/04/19     PT LONG TERM GOAL #3   Title  Active right knee flexion to 115 degrees+ so the patient can perform functional tasks and do so with pain not > 2-3/10.    Time  4    Period  Weeks    Status  On-going   AROM 103 degrees 10/04/19     PT LONG TERM GOAL #4   Title  Increase right knee strength to a solid 4+/5 to provide good stability for accomplishment of functional activities.    Time  4    Period  Weeks    Status  On-going   NT 10/04/19     PT LONG TERM GOAL #5   Title  Perform a reciprocating stair gait with one railing with pain not > 2-3/10.    Time  4    Period  Weeks    Status  On-going            Plan - 10/08/19 1242    Clinical Impression Statement  Patient presented in clinic with no complaints of any excessive R knee pain but only rated her R knee as "fair" upon arrival. Patient still limited with any prolonged standing or activity such as returning to stores. Patient able to complete exercises well with good technique and control although fatigued quickly with forward step ups. Normal modalities response noted following removal of the modalities.    Personal Factors and Comorbidities  Comorbidity 1;Comorbidity 2    Comorbidities  OSA, TSA, OA, HTN, Anemia.    Examination-Activity Limitations  Other;Transfers;Locomotion Level;Stairs;Squat    Examination-Participation Restrictions  Personal Finances;Meal Prep    Stability/Clinical Decision Making  Stable/Uncomplicated    Rehab Potential  Good    PT Frequency  3x / week    PT Duration  4 weeks    PT Treatment/Interventions  ADLs/Self Care Home Management;Cryotherapy;Electrical Stimulation;Gait training;Stair  training;Functional mobility training;Therapeutic activities;Therapeutic exercise;Neuromuscular re-education;Manual techniques;Patient/family education;Passive range of motion;Vasopneumatic Device    PT Next Visit Plan  Cont with POC for ROM and strengthening Vasopneumatic and electrical stimulation.    Consulted and Agree with Plan of Care  Patient       Patient will benefit from skilled therapeutic intervention in order to improve the following deficits and impairments:  Pain, Decreased activity tolerance, Decreased strength, Increased edema, Decreased range of motion  Visit Diagnosis: Stiffness of right knee, not elsewhere classified  Chronic pain of right knee  Localized edema  Muscle weakness (generalized)     Problem List Patient Active Problem List   Diagnosis Date Noted  . S/P left TKA 08/17/2019  . Status post total right knee replacement 08/17/2019  . OAB (overactive bladder) 06/22/2019  . ILD (interstitial lung disease) ([place]) 12/15/2018  . Enlarged heart 06/16/2018  . Morbid obesity ([place]) 10/17/2017  . 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 Hot flashes, menopausal 12/31/2016  . DDD (degenerative disc disease), lumbar 11/06/2015  . Right-sided low back pain without sciatica 06/21/2015  . Diastolic dysfunction without heart failure 11/13/2014  . Other abnormal glucose 05/27/2013  . Osteopenia of the elderly 05/27/2013  . Hyperlipidemia LDL goal <130 09/04/2012  . Esophageal stricture 09/12/2011  . Visit for screening mammogram 08/08/2011  . Routine general medical examination at a health care facility 08/08/2011  . Essential hypertension, benign 01/14/2011  . URINARY INCONTINENCE 12/14/2008  . OA (osteoarthritis) 07/21/2008  . Obstructive sleep apnea 10/14/2007  . GERD 10/14/2007    Standley Brooking, PTA 10/08/2019, 12:45 PM  [facility] [address] [place], [place], [address] Phone: [phone]   Fax:   [phone]  Name: Veronica Garza MRN: [id number] Date of Birth: 20-Mar-1941

## 2019-10-11 ENCOUNTER — Ambulatory Visit: Payer: Medicare Other | Admitting: Physical Therapy

## 2019-10-11 ENCOUNTER — Encounter: Payer: Self-pay | Admitting: Physical Therapy

## 2019-10-11 ENCOUNTER — Other Ambulatory Visit: Payer: Self-pay

## 2019-10-11 DIAGNOSIS — M6281 Muscle weakness (generalized): Secondary | ICD-10-CM | POA: Diagnosis not present

## 2019-10-11 DIAGNOSIS — M25661 Stiffness of right knee, not elsewhere classified: Secondary | ICD-10-CM | POA: Diagnosis not present

## 2019-10-11 DIAGNOSIS — R600 Localized edema: Secondary | ICD-10-CM | POA: Diagnosis not present

## 2019-10-11 DIAGNOSIS — M25561 Pain in right knee: Secondary | ICD-10-CM | POA: Diagnosis not present

## 2019-10-11 DIAGNOSIS — G8929 Other chronic pain: Secondary | ICD-10-CM | POA: Diagnosis not present

## 2019-10-11 NOTE — Therapy (Signed)
[facility] [place], [place], [address] Phone: [phone]   Fax:  [phone]  Physical Therapy Treatment  Patient Details  Name: Veronica Garza MRN: [id number] Date of Birth: 1941-07-03 Referring Provider (PT): Paralee Cancel MD   Encounter Date: 10/11/2019  PT End of Session - 10/11/19 1031    Visit Number  16    Number of Visits  16    Date for PT Re-Evaluation  11/18/19    Authorization Type  FOTO AT LEAST EVERY 5TH VISIT.  PROGRESS NOTE AT 10TH VISIT.  KX MODIFIER AFTER 15 VISITS.    PT Start Time  1030    PT Stop Time  1125    PT Time Calculation (min)  55 min    Activity Tolerance  Patient tolerated treatment well    Behavior During Therapy  WFL for tasks assessed/performed       Past Medical History:  Diagnosis Date  . Anemia   . Arthritis   . BCC (basal cell carcinoma) 07/19/2014   Right cheek  . Cataract   . Complication of anesthesia    takes patient a long time to wake up  . GERD (gastroesophageal reflux disease)   . Hx of shoulder replacement    lt  . Hypertension   . OSA (obstructive sleep apnea)    use CPAP  . Pneumonia    History of  . Urinary incontinence     Past Surgical History:  Procedure Laterality Date  . ABDOMINAL HYSTERECTOMY    . EYE SURGERY     bilateral cataract extraction  . FINGER SURGERY     cyst left middle finger  . JOINT REPLACEMENT     left shoulder  . NASAL SINUS SURGERY    . SHOULDER ARTHROSCOPY     Left  . TOTAL KNEE ARTHROPLASTY Right 08/17/2019   Procedure: TOTAL KNEE ARTHROPLASTY;  Surgeon: Paralee Cancel, MD;  Location: [facility];  Service: Orthopedics;  Laterality: Right;  70 mins  . TOTAL SHOULDER ARTHROPLASTY  10/10/2011   Procedure: TOTAL SHOULDER ARTHROPLASTY;  Surgeon: Metta Clines Supple;  Location: [facility];  Service: Orthopedics;  Laterality: Right;  . UPPER GASTROINTESTINAL ENDOSCOPY    . Vaginal Sling      There were no vitals filed for this  visit.  Subjective Assessment - 10/11/19 1031    Subjective  COVID 19 screening completed in clinic upon arrival. Reports her incision and knee is still sensitive to even light touch. Uses Lubriderm lotion on her knee but was wondering if voltaren gel would be okay for her knee.    Pertinent History  OSA, TSA, OA, HTN, Anemia.    Patient Stated Goals  Get back to normal life.    Currently in Pain?  Yes    Pain Score  3     Pain Location  Knee    Pain Orientation  Right    Pain Descriptors / Indicators  Sore    Pain Type  Surgical pain    Pain Onset  More than a month ago    Pain Frequency  Constant         [facility] PT Assessment - 10/11/19 0001      Assessment   Medical Diagnosis  Right total knee replacement.    Referring Provider (PT)  Paralee Cancel MD    Onset Date/Surgical Date  08/17/19    Next MD Visit  10/14/2019      Restrictions   Weight Bearing Restrictions  No      ROM / Strength   AROM / PROM / Strength  AROM;Strength      AROM   Overall AROM   Within functional limits for tasks performed    Right/Left Knee  Right    Right Knee Extension  0    Right Knee Flexion  116      Strength   Overall Strength  Within functional limits for tasks performed    Strength Assessment Site  Knee    Right/Left Knee  Right    Right Knee Flexion  4+/5    Right Knee Extension  4+/5                   [facility] Adult PT Treatment/Exercise - 10/11/19 0001      Ambulation/Gait   Stairs  Yes    Stairs Assistance  6: Modified independent (Device/Increase time)    Stair Management Technique  One rail Left;Step to pattern;Forwards    Number of Stairs  4   x 1 RT   Height of Stairs  6.5      Knee/Hip Exercises: Aerobic   Nustep  L5 x12 min      Knee/Hip Exercises: Standing   Forward Lunges  Right;15 reps;2 seconds    Hip Abduction  AROM;Both;2 sets;10 reps;Knee straight    Forward Step Up  Right;10 reps;Hand Hold: 2;Step Height: 6"    Rocker Board  3 minutes       Knee/Hip Exercises: Seated   Long Arc Quad  Strengthening;Right;2 sets;10 reps    Long Arc Quad Weight  4 lbs.      Modalities   Modalities  Management consultant  IFC    Electrical Stimulation Parameters  80-150 hz x15 min    Electrical Stimulation Goals  Edema;Pain      Vasopneumatic   Number Minutes Vasopneumatic   15 minutes    Vasopnuematic Location   Knee    Vasopneumatic Pressure  Low    Vasopneumatic Temperature   34               PT Short Term Goals - 08/20/19 1502      PT SHORT TERM GOAL #1   Title  STG's=LTG's.        PT Long Term Goals - 10/11/19 1100      PT LONG TERM GOAL #1   Title  Independent with a HEP.    Time  4    Period  Weeks    Status  Achieved      PT LONG TERM GOAL #2   Title  Full right active knee extension in order to normalize gait.    Time  4    Period  Weeks    Status  Achieved      PT LONG TERM GOAL #3   Title  Active right knee flexion to 115 degrees+ so the patient can perform functional tasks and do so with pain not > 2-3/10.    Time  4    Period  Weeks    Status  Achieved      PT LONG TERM GOAL #4   Title  Increase right knee strength to a solid 4+/5 to provide good stability for accomplishment of functional activities.    Time  4    Period  Weeks    Status  Achieved  PT LONG TERM GOAL #5   Title  Perform a reciprocating stair gait with one railing with pain not > 2-3/10.    Time  4    Period  Weeks    Status  On-going            Plan - 10/11/19 1216    Clinical Impression Statement  Patient presented in clinic with reports of sensitivity to R knee incision with even light touch. Patient able to complete light therex within clinic but fatigued. Limited with stair ambulation due to previous L ankle injuries and fractures. AROM of R knee measured as 0-116 deg. R knee MMT assessed as  4+/5 throughout. Normal modalities response noted following removal of the modalities.    Personal Factors and Comorbidities  Comorbidity 1;Comorbidity 2    Comorbidities  OSA, TSA, OA, HTN, Anemia.    Examination-Activity Limitations  Other;Transfers;Locomotion Level;Stairs;Squat    Examination-Participation Restrictions  Personal Finances;Meal Prep    Stability/Clinical Decision Making  Stable/Uncomplicated    Rehab Potential  Good    PT Frequency  3x / week    PT Duration  4 weeks    PT Treatment/Interventions  ADLs/Self Care Home Management;Cryotherapy;Electrical Stimulation;Gait training;Stair training;Functional mobility training;Therapeutic activities;Therapeutic exercise;Neuromuscular re-education;Manual techniques;Patient/family education;Passive range of motion;Vasopneumatic Device    PT Next Visit Plan  Cont with POC for ROM and strengthening Vasopneumatic and electrical stimulation.    Consulted and Agree with Plan of Care  Patient       Patient will benefit from skilled therapeutic intervention in order to improve the following deficits and impairments:  Pain, Decreased activity tolerance, Decreased strength, Increased edema, Decreased range of motion  Visit Diagnosis: Stiffness of right knee, not elsewhere classified  Chronic pain of right knee  Localized edema  Muscle weakness (generalized)     Problem List Patient Active Problem List   Diagnosis Date Noted  . S/P left TKA 08/17/2019  . Status post total right knee replacement 08/17/2019  . OAB (overactive bladder) 06/22/2019  . ILD (interstitial lung disease) ([place]) 12/15/2018  . Enlarged heart 06/16/2018  . Morbid obesity ([place]) 10/17/2017  . 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 Hot flashes, menopausal 12/31/2016  . DDD (degenerative disc disease), lumbar 11/06/2015  . Right-sided low back pain without sciatica 06/21/2015  . Diastolic dysfunction without heart failure 11/13/2014  . Other abnormal  glucose 05/27/2013  . Osteopenia of the elderly 05/27/2013  . Hyperlipidemia LDL goal <130 09/04/2012  . Esophageal stricture 09/12/2011  . Visit for screening mammogram 08/08/2011  . Routine general medical examination at a health care facility 08/08/2011  . Essential hypertension, benign 01/14/2011  . URINARY INCONTINENCE 12/14/2008  . OA (osteoarthritis) 07/21/2008  . Obstructive sleep apnea 10/14/2007  . GERD 10/14/2007   Standley Brooking, PTA 10/11/19 12:27 PM   [facility] [address] [place], [place], [address] Phone: [phone]   Fax:  [phone]  Name: Veronica Garza MRN: [id number] Date of Birth: Apr 07, 1941

## 2019-10-13 ENCOUNTER — Encounter: Payer: Medicare Other | Admitting: Physical Therapy

## 2019-10-14 ENCOUNTER — Encounter: Payer: Self-pay | Admitting: Physical Therapy

## 2019-10-14 ENCOUNTER — Ambulatory Visit: Payer: Medicare Other | Admitting: Physical Therapy

## 2019-10-14 ENCOUNTER — Other Ambulatory Visit: Payer: Self-pay

## 2019-10-14 DIAGNOSIS — M25661 Stiffness of right knee, not elsewhere classified: Secondary | ICD-10-CM

## 2019-10-14 DIAGNOSIS — M25561 Pain in right knee: Secondary | ICD-10-CM | POA: Diagnosis not present

## 2019-10-14 DIAGNOSIS — R600 Localized edema: Secondary | ICD-10-CM

## 2019-10-14 DIAGNOSIS — M6281 Muscle weakness (generalized): Secondary | ICD-10-CM

## 2019-10-14 DIAGNOSIS — G8929 Other chronic pain: Secondary | ICD-10-CM | POA: Diagnosis not present

## 2019-10-14 NOTE — Therapy (Signed)
[facility] [place], [place], [address] Phone: [phone]   Fax:  [phone]  Physical Therapy Treatment  Patient Details  Name: Veronica Garza MRN: [id number] Date of Birth: [birth year] Referring Provider (PT): Paralee Cancel MD   Encounter Date: 10/14/2019  PT End of Session - 10/14/19 1111    Visit Number  17    Number of Visits  24    Date for PT Re-Evaluation  11/18/19    Authorization Type  FOTO AT LEAST EVERY 5TH VISIT.  PROGRESS NOTE AT 10TH VISIT.  KX MODIFIER AFTER 15 VISITS.    PT Start Time  1032    PT Stop Time  1127    PT Time Calculation (min)  55 min    Activity Tolerance  Patient tolerated treatment well    Behavior During Therapy  WFL for tasks assessed/performed       Past Medical History:  Diagnosis Date  . Anemia   . Arthritis   . BCC (basal cell carcinoma) 07/19/2014   Right cheek  . Cataract   . Complication of anesthesia    takes patient a long time to wake up  . GERD (gastroesophageal reflux disease)   . Hx of shoulder replacement    lt  . Hypertension   . OSA (obstructive sleep apnea)    use CPAP  . Pneumonia    History of  . Urinary incontinence     Past Surgical History:  Procedure Laterality Date  . ABDOMINAL HYSTERECTOMY    . EYE SURGERY     bilateral cataract extraction  . FINGER SURGERY     cyst left middle finger  . JOINT REPLACEMENT     left shoulder  . NASAL SINUS SURGERY    . SHOULDER ARTHROSCOPY     Left  . TOTAL KNEE ARTHROPLASTY Right 08/17/2019   Procedure: TOTAL KNEE ARTHROPLASTY;  Surgeon: Paralee Cancel, MD;  Location: [facility];  Service: Orthopedics;  Laterality: Right;  70 mins  . TOTAL SHOULDER ARTHROPLASTY  10/10/2011   Procedure: TOTAL SHOULDER ARTHROPLASTY;  Surgeon: Metta Clines Supple;  Location: [facility];  Service: Orthopedics;  Laterality: Right;  . UPPER GASTROINTESTINAL ENDOSCOPY    . Vaginal Sling      There were no vitals filed for this  visit.  Subjective Assessment - 10/14/19 1042    Subjective  COVID 19 screening completed in clinic upon arrival. Patient went to MD and very pleased with progress.Patient requested for more visits for endurace tolerance and strength.    Pertinent History  OSA, TSA, OA, HTN, Anemia.    Patient Stated Goals  Get back to normal life.    Currently in Pain?  Yes    Pain Score  3     Pain Location  Knee    Pain Orientation  Right    Pain Descriptors / Indicators  Sore    Pain Type  Surgical pain    Pain Onset  More than a month ago    Pain Frequency  Intermittent    Aggravating Factors   prolong walking    Pain Relieving Factors  at rest                       [facility] Adult PT Treatment/Exercise - 10/14/19 0001      Knee/Hip Exercises: Aerobic   Nustep  L4 x 10 min (40 step per min), x40min LE only      Knee/Hip Exercises: Standing  Lateral Step Up  Right;10 reps;Step Height: 8";Step Height: 2"    Forward Step Up  Right;10 reps;Hand Hold: 2;Step Height: 6";2 sets      Knee/Hip Exercises: Seated   Long Arc Quad  Strengthening;Right;10 reps;3 sets    [employer] Weight  4 lbs.    Hamstring Curl  Strengthening;Right;20 reps;Weights    Hamstring Limitations  green t-band      Knee/Hip Exercises: Supine   Straight Leg Raise with External Rotation  Strengthening;Right;20 reps    Other Supine Knee/Hip Exercises  clam w green t-band x30      Electrical Stimulation   Electrical Stimulation Location  R knee    Electrical Stimulation Action  IFC    Electrical Stimulation Parameters  80-150hz  x40min    Electrical Stimulation Goals  Edema;Pain      Vasopneumatic   Number Minutes Vasopneumatic   15 minutes    Vasopnuematic Location   Knee    Vasopneumatic Pressure  Low    Vasopneumatic Temperature   34               PT Short Term Goals - 08/20/19 1502      PT SHORT TERM GOAL #1   Title  STG's=LTG's.        PT Long Term Goals - 10/11/19 1100      PT  LONG TERM GOAL #1   Title  Independent with a HEP.    Time  4    Period  Weeks    Status  Achieved      PT LONG TERM GOAL #2   Title  Full right active knee extension in order to normalize gait.    Time  4    Period  Weeks    Status  Achieved      PT LONG TERM GOAL #3   Title  Active right knee flexion to 115 degrees+ so the patient can perform functional tasks and do so with pain not > 2-3/10.    Time  4    Period  Weeks    Status  Achieved      PT LONG TERM GOAL #4   Title  Increase right knee strength to a solid 4+/5 to provide good stability for accomplishment of functional activities.    Time  4    Period  Weeks    Status  Achieved      PT LONG TERM GOAL #5   Title  Perform a reciprocating stair gait with one railing with pain not > 2-3/10.    Time  4    Period  Weeks    Status  On-going            Plan - 10/14/19 1113    Clinical Impression Statement  Patient tolerated treatment well today. Patient able to progress with activity tolerance and strengthening exercises. Patient has reported some ongoing soreness in knee and sensitivity to knee. Patient progressing and sending to PT for goal assessment.    Personal Factors and Comorbidities  Comorbidity 1;Comorbidity 2    Comorbidities  OSA, TSA, OA, HTN, Anemia.    Examination-Activity Limitations  Other;Transfers;Locomotion Level;Stairs;Squat    Examination-Participation Restrictions  Personal Finances;Meal Prep    Stability/Clinical Decision Making  Stable/Uncomplicated    Rehab Potential  Good    PT Frequency  3x / week    PT Duration  4 weeks    PT Treatment/Interventions  ADLs/Self Care Home Management;Cryotherapy;Electrical Stimulation;Gait training;Stair training;Functional mobility training;Therapeutic activities;Therapeutic exercise;Neuromuscular re-education;Manual  techniques;Patient/family education;Passive range of motion;Vasopneumatic Device    PT Next Visit Plan  cont with POC per PT recommendation and  please update HEP    Consulted and Agree with Plan of Care  Patient       Patient will benefit from skilled therapeutic intervention in order to improve the following deficits and impairments:  Pain, Decreased activity tolerance, Decreased strength, Increased edema, Decreased range of motion  Visit Diagnosis: Stiffness of right knee, not elsewhere classified  Chronic pain of right knee  Localized edema  Muscle weakness (generalized)     Problem List Patient Active Problem List   Diagnosis Date Noted  . S/P left TKA 08/17/2019  . Status post total right knee replacement 08/17/2019  . OAB (overactive bladder) 06/22/2019  . ILD (interstitial lung disease) ([place]) 12/15/2018  . Enlarged heart 06/16/2018  . Morbid obesity ([place]) 10/17/2017  . 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 Hot flashes, menopausal 12/31/2016  . DDD (degenerative disc disease), lumbar 11/06/2015  . Right-sided low back pain without sciatica 06/21/2015  . Diastolic dysfunction without heart failure 11/13/2014  . Other abnormal glucose 05/27/2013  . Osteopenia of the elderly 05/27/2013  . Hyperlipidemia LDL goal <130 09/04/2012  . Esophageal stricture 09/12/2011  . Visit for screening mammogram 08/08/2011  . Routine general medical examination at a health care facility 08/08/2011  . Essential hypertension, benign 01/14/2011  . URINARY INCONTINENCE 12/14/2008  . OA (osteoarthritis) 07/21/2008  . Obstructive sleep apnea 10/14/2007  . GERD 10/14/2007   Ladean Raya, PTA 10/14/19 11:28 AM  [facility] [place], [place], [address] Phone: [phone]   Fax:  [phone]  Name: Veronica Garza MRN: [id number] Date of Birth: November 02, 1941

## 2019-10-15 ENCOUNTER — Encounter: Payer: Medicare Other | Admitting: Physical Therapy

## 2019-10-19 ENCOUNTER — Encounter: Payer: Self-pay | Admitting: Physical Therapy

## 2019-10-19 ENCOUNTER — Other Ambulatory Visit: Payer: Self-pay

## 2019-10-19 ENCOUNTER — Ambulatory Visit: Payer: Medicare Other | Admitting: Physical Therapy

## 2019-10-19 DIAGNOSIS — M25661 Stiffness of right knee, not elsewhere classified: Secondary | ICD-10-CM

## 2019-10-19 DIAGNOSIS — M25561 Pain in right knee: Secondary | ICD-10-CM

## 2019-10-19 DIAGNOSIS — G8929 Other chronic pain: Secondary | ICD-10-CM | POA: Diagnosis not present

## 2019-10-19 DIAGNOSIS — M6281 Muscle weakness (generalized): Secondary | ICD-10-CM | POA: Diagnosis not present

## 2019-10-19 DIAGNOSIS — R600 Localized edema: Secondary | ICD-10-CM | POA: Diagnosis not present

## 2019-10-19 NOTE — Therapy (Signed)
[facility] [place], [place], [address] Phone: [phone]   Fax:  [phone]  Physical Therapy Treatment  Patient Details  Name: Veronica Garza MRN: [id number] Date of Birth: 1941-06-03 Referring Provider (PT): Paralee Cancel MD   Encounter Date: 10/19/2019  PT End of Session - 10/19/19 1058    Visit Number  18    Number of Visits  24    Date for PT Re-Evaluation  11/18/19    Authorization Type  FOTO AT LEAST EVERY 5TH VISIT.  PROGRESS NOTE AT 10TH VISIT.  KX MODIFIER AFTER 15 VISITS.    PT Start Time  0945    PT Stop Time  1036    PT Time Calculation (min)  51 min    Activity Tolerance  Patient tolerated treatment well    Behavior During Therapy  WFL for tasks assessed/performed       Past Medical History:  Diagnosis Date  . Anemia   . Arthritis   . BCC (basal cell carcinoma) 07/19/2014   Right cheek  . Cataract   . Complication of anesthesia    takes patient a long time to wake up  . GERD (gastroesophageal reflux disease)   . Hx of shoulder replacement    lt  . Hypertension   . OSA (obstructive sleep apnea)    use CPAP  . Pneumonia    History of  . Urinary incontinence     Past Surgical History:  Procedure Laterality Date  . ABDOMINAL HYSTERECTOMY    . EYE SURGERY     bilateral cataract extraction  . FINGER SURGERY     cyst left middle finger  . JOINT REPLACEMENT     left shoulder  . NASAL SINUS SURGERY    . SHOULDER ARTHROSCOPY     Left  . TOTAL KNEE ARTHROPLASTY Right 08/17/2019   Procedure: TOTAL KNEE ARTHROPLASTY;  Surgeon: Paralee Cancel, MD;  Location: [facility];  Service: Orthopedics;  Laterality: Right;  70 mins  . TOTAL SHOULDER ARTHROPLASTY  10/10/2011   Procedure: TOTAL SHOULDER ARTHROPLASTY;  Surgeon: Metta Clines Supple;  Location: [facility];  Service: Orthopedics;  Laterality: Right;  . UPPER GASTROINTESTINAL ENDOSCOPY    . Vaginal Sling      There were no vitals filed for this  visit.  Subjective Assessment - 10/19/19 1101    Subjective  COVID-19 screen performed prior to patient entering clinic.  Doing good.    Pertinent History  OSA, TSA, OA, HTN, Anemia.    Patient Stated Goals  Get back to normal life.    Currently in Pain?  Yes    Pain Score  3     Pain Location  Knee    Pain Orientation  Right    Pain Descriptors / Indicators  Sore    Pain Type  Surgical pain    Pain Onset  More than a month ago         [facility] PT Assessment - 10/19/19 0001      PROM   Right Knee Flexion  120                   [facility] Adult PT Treatment/Exercise - 10/19/19 0001      Knee/Hip Exercises: Aerobic   Nustep  Level 4 monitored x 15 minutes and moving forward as tolerated to increase knee flexion.      Knee/Hip Exercises: Standing   Other Standing Knee Exercises  6 ich forward step-ups (3 minutes), lateral  step-ups (3 minutes) and mini-squats holding onto parallel bars x 2 minutes.      Vasopneumatic   Number Minutes Vasopneumatic   18 minutes    Vasopnuematic Location   --   Right knee.   Vasopneumatic Pressure  Low      Manual Therapy   Manual Therapy  Passive ROM    Passive ROM  In supine:  Right knee PROM into flexion (2 minutes).               PT Short Term Goals - 08/20/19 1502      PT SHORT TERM GOAL #1   Title  STG's=LTG's.        PT Long Term Goals - 10/14/19 1145      PT LONG TERM GOAL #6   Title  5/5 right knee strength.    Time  6    Period  Weeks    Status  New            Plan - 10/19/19 1107    Clinical Impression Statement  Patient did a great job today with passive right knee flexion to 120 degrees today. She did well with the addition of step-ups and mini-squats.    Personal Factors and Comorbidities  Comorbidity 1;Comorbidity 2    Comorbidities  OSA, TSA, OA, HTN, Anemia.    Examination-Activity Limitations  Other;Transfers;Locomotion Level;Stairs;Squat    Examination-Participation Restrictions  Personal  Finances;Meal Prep    Stability/Clinical Decision Making  Stable/Uncomplicated    Rehab Potential  Good    PT Frequency  3x / week    PT Duration  4 weeks    PT Treatment/Interventions  ADLs/Self Care Home Management;Cryotherapy;Electrical Stimulation;Gait training;Stair training;Functional mobility training;Therapeutic activities;Therapeutic exercise;Neuromuscular re-education;Manual techniques;Patient/family education;Passive range of motion;Vasopneumatic Device    PT Next Visit Plan  cont with POC per PT recommendation and please update HEP    Consulted and Agree with Plan of Care  Patient       Patient will benefit from skilled therapeutic intervention in order to improve the following deficits and impairments:  Pain, Decreased activity tolerance, Decreased strength, Increased edema, Decreased range of motion  Visit Diagnosis: Stiffness of right knee, not elsewhere classified  Chronic pain of right knee  Localized edema     Problem List Patient Active Problem List   Diagnosis Date Noted  . S/P left TKA 08/17/2019  . Status post total right knee replacement 08/17/2019  . OAB (overactive bladder) 06/22/2019  . ILD (interstitial lung disease) ([place]) 12/15/2018  . Enlarged heart 06/16/2018  . Morbid obesity ([place]) 10/17/2017  . 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 Hot flashes, menopausal 12/31/2016  . DDD (degenerative disc disease), lumbar 11/06/2015  . Right-sided low back pain without sciatica 06/21/2015  . Diastolic dysfunction without heart failure 11/13/2014  . Other abnormal glucose 05/27/2013  . Osteopenia of the elderly 05/27/2013  . Hyperlipidemia LDL goal <130 09/04/2012  . Esophageal stricture 09/12/2011  . Visit for screening mammogram 08/08/2011  . Routine general medical examination at a health care facility 08/08/2011  . Essential hypertension, benign 01/14/2011  . URINARY INCONTINENCE 12/14/2008  . OA (osteoarthritis) 07/21/2008  . Obstructive sleep  apnea 10/14/2007  . GERD 10/14/2007    Veronica Garza, [place] MPT 10/19/2019, 11:12 AM  [facility] [place], [place], [address] Phone: [phone]   Fax:  [phone]  Name: Veronica Garza MRN: [id number] Date of Birth: 09-17-1941

## 2019-10-21 ENCOUNTER — Ambulatory Visit: Payer: Medicare Other | Admitting: Physical Therapy

## 2019-10-21 ENCOUNTER — Other Ambulatory Visit: Payer: Self-pay

## 2019-10-21 DIAGNOSIS — M6281 Muscle weakness (generalized): Secondary | ICD-10-CM | POA: Diagnosis not present

## 2019-10-21 DIAGNOSIS — R600 Localized edema: Secondary | ICD-10-CM | POA: Diagnosis not present

## 2019-10-21 DIAGNOSIS — M25661 Stiffness of right knee, not elsewhere classified: Secondary | ICD-10-CM | POA: Diagnosis not present

## 2019-10-21 DIAGNOSIS — G8929 Other chronic pain: Secondary | ICD-10-CM | POA: Diagnosis not present

## 2019-10-21 DIAGNOSIS — M25561 Pain in right knee: Secondary | ICD-10-CM | POA: Diagnosis not present

## 2019-10-21 NOTE — Therapy (Signed)
[facility] [place], [place], [address] Phone: [phone]   Fax:  [phone]  Physical Therapy Treatment  Patient Details  Name: Veronica Garza MRN: [id number] Date of Birth: June 18, 1941 Referring Provider (PT): Paralee Cancel MD   Encounter Date: 10/21/2019  PT End of Session - 10/21/19 1136    Visit Number  19    Number of Visits  24    Date for PT Re-Evaluation  11/18/19    Authorization Type  FOTO AT LEAST EVERY 5TH VISIT.  PROGRESS NOTE AT 10TH VISIT.  KX MODIFIER AFTER 15 VISITS.    PT Start Time  1117    PT Stop Time  1205    PT Time Calculation (min)  48 min    Activity Tolerance  Patient tolerated treatment well    Behavior During Therapy  WFL for tasks assessed/performed       Past Medical History:  Diagnosis Date  . Anemia   . Arthritis   . BCC (basal cell carcinoma) 07/19/2014   Right cheek  . Cataract   . Complication of anesthesia    takes patient a long time to wake up  . GERD (gastroesophageal reflux disease)   . Hx of shoulder replacement    lt  . Hypertension   . OSA (obstructive sleep apnea)    use CPAP  . Pneumonia    History of  . Urinary incontinence     Past Surgical History:  Procedure Laterality Date  . ABDOMINAL HYSTERECTOMY    . EYE SURGERY     bilateral cataract extraction  . FINGER SURGERY     cyst left middle finger  . JOINT REPLACEMENT     left shoulder  . NASAL SINUS SURGERY    . SHOULDER ARTHROSCOPY     Left  . TOTAL KNEE ARTHROPLASTY Right 08/17/2019   Procedure: TOTAL KNEE ARTHROPLASTY;  Surgeon: Paralee Cancel, MD;  Location: [facility];  Service: Orthopedics;  Laterality: Right;  70 mins  . TOTAL SHOULDER ARTHROPLASTY  10/10/2011   Procedure: TOTAL SHOULDER ARTHROPLASTY;  Surgeon: Metta Clines Supple;  Location: [facility];  Service: Orthopedics;  Laterality: Right;  . UPPER GASTROINTESTINAL ENDOSCOPY    . Vaginal Sling      There were no vitals filed for this  visit.  Subjective Assessment - 10/21/19 1138    Subjective  COVID-19 screen performed prior to patient entering clinic.  Knee feels stiif.  Haven't been using cane around home much.    Pertinent History  OSA, TSA, OA, HTN, Anemia.                       [facility] Adult PT Treatment/Exercise - 10/21/19 0001      Knee/Hip Exercises: Aerobic   Nustep  Level 5 x 13 minutes.      Modalities   Modalities  Electrical Stimulation;Moist Heat      Moist Heat Therapy   Number Minutes Moist Heat  15 Minutes    Moist Heat Location  --   Right knee.     Acupuncturist Location  Right knee.    Electrical Stimulation Action  IFC    Electrical Stimulation Parameters  80-150 Hz x 15 minutes.    Electrical Stimulation Goals  Pain      Manual Therapy   Manual Therapy  Passive ROM    Passive ROM  In supine:  Right patellar mobs and STW/M x 10 minutes.  PT Short Term Goals - 08/20/19 1502      PT SHORT TERM GOAL #1   Title  STG's=LTG's.        PT Long Term Goals - 10/14/19 1145      PT LONG TERM GOAL #6   Title  5/5 right knee strength.    Time  6    Period  Weeks    Status  New            Plan - 10/21/19 1159    Clinical Impression Statement  Excellent progress FOTO score 48% limitation which is better than predicted.    Personal Factors and Comorbidities  Comorbidity 1;Comorbidity 2    Comorbidities  OSA, TSA, OA, HTN, Anemia.    Examination-Activity Limitations  Other;Transfers;Locomotion Level;Stairs;Squat    Examination-Participation Restrictions  Personal Finances;Meal Prep    Stability/Clinical Decision Making  Stable/Uncomplicated    Rehab Potential  Good    PT Frequency  3x / week    PT Duration  4 weeks    PT Treatment/Interventions  ADLs/Self Care Home Management;Cryotherapy;Electrical Stimulation;Gait training;Stair training;Functional mobility training;Therapeutic activities;Therapeutic  exercise;Neuromuscular re-education;Manual techniques;Patient/family education;Passive range of motion;Vasopneumatic Device    PT Next Visit Plan  cont with POC per PT recommendation and please update HEP    Consulted and Agree with Plan of Care  Patient       Patient will benefit from skilled therapeutic intervention in order to improve the following deficits and impairments:  Pain, Decreased activity tolerance, Decreased strength, Increased edema, Decreased range of motion  Visit Diagnosis: Stiffness of right knee, not elsewhere classified  Chronic pain of right knee  Localized edema     Problem List Patient Active Problem List   Diagnosis Date Noted  . S/P left TKA 08/17/2019  . Status post total right knee replacement 08/17/2019  . OAB (overactive bladder) 06/22/2019  . ILD (interstitial lung disease) ([place]) 12/15/2018  . Enlarged heart 06/16/2018  . Morbid obesity ([place]) 10/17/2017  . 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 Hot flashes, menopausal 12/31/2016  . DDD (degenerative disc disease), lumbar 11/06/2015  . Right-sided low back pain without sciatica 06/21/2015  . Diastolic dysfunction without heart failure 11/13/2014  . Other abnormal glucose 05/27/2013  . Osteopenia of the elderly 05/27/2013  . Hyperlipidemia LDL goal <130 09/04/2012  . Esophageal stricture 09/12/2011  . Visit for screening mammogram 08/08/2011  . Routine general medical examination at a health care facility 08/08/2011  . Essential hypertension, benign 01/14/2011  . URINARY INCONTINENCE 12/14/2008  . OA (osteoarthritis) 07/21/2008  . Obstructive sleep apnea 10/14/2007  . GERD 10/14/2007    Zaydin Billey, [place] MPT 10/21/2019, 12:08 PM  [facility] [address] [place], [place], [address] Phone: [phone]   Fax:  [phone]  Name: Veronica Garza MRN: [id number] Date of Birth: 04-May-1941

## 2019-10-25 ENCOUNTER — Ambulatory Visit: Payer: Medicare Other | Admitting: Physical Therapy

## 2019-10-25 ENCOUNTER — Other Ambulatory Visit: Payer: Self-pay

## 2019-10-25 ENCOUNTER — Encounter: Payer: Self-pay | Admitting: Physical Therapy

## 2019-10-25 DIAGNOSIS — R600 Localized edema: Secondary | ICD-10-CM

## 2019-10-25 DIAGNOSIS — M25561 Pain in right knee: Secondary | ICD-10-CM | POA: Diagnosis not present

## 2019-10-25 DIAGNOSIS — G8929 Other chronic pain: Secondary | ICD-10-CM | POA: Diagnosis not present

## 2019-10-25 DIAGNOSIS — M25661 Stiffness of right knee, not elsewhere classified: Secondary | ICD-10-CM

## 2019-10-25 DIAGNOSIS — M6281 Muscle weakness (generalized): Secondary | ICD-10-CM | POA: Diagnosis not present

## 2019-10-25 NOTE — Therapy (Addendum)
[facility] [place], [place], [address] Phone: [phone]   Fax:  [phone]  Physical Therapy Treatment  Patient Details  Name: Veronica Garza MRN: [id number] Date of Birth: 1941/07/23 Referring Provider (PT): Paralee Cancel MD   Encounter Date: 10/25/2019  PT End of Session - 10/25/19 1037    Visit Number  20    Number of Visits  24    Date for PT Re-Evaluation  11/18/19    Authorization Type  FOTO AT LEAST EVERY 5TH VISIT.  PROGRESS NOTE AT 10TH VISIT.  KX MODIFIER AFTER 15 VISITS.    PT Start Time  1033    PT Stop Time  1126    PT Time Calculation (min)  53 min    Equipment Utilized During Treatment  Other (comment)   SPC   Activity Tolerance  Patient tolerated treatment well    Behavior During Therapy  WFL for tasks assessed/performed       Past Medical History:  Diagnosis Date  . Anemia   . Arthritis   . BCC (basal cell carcinoma) 07/19/2014   Right cheek  . Cataract   . Complication of anesthesia    takes patient a long time to wake up  . GERD (gastroesophageal reflux disease)   . Hx of shoulder replacement    lt  . Hypertension   . OSA (obstructive sleep apnea)    use CPAP  . Pneumonia    History of  . Urinary incontinence     Past Surgical History:  Procedure Laterality Date  . ABDOMINAL HYSTERECTOMY    . EYE SURGERY     bilateral cataract extraction  . FINGER SURGERY     cyst left middle finger  . JOINT REPLACEMENT     left shoulder  . NASAL SINUS SURGERY    . SHOULDER ARTHROSCOPY     Left  . TOTAL KNEE ARTHROPLASTY Right 08/17/2019   Procedure: TOTAL KNEE ARTHROPLASTY;  Surgeon: Paralee Cancel, MD;  Location[facility];  Service: Orthopedics;  Laterality: Right;  70 mins  . TOTAL SHOULDER ARTHROPLASTY  10/10/2011   Procedure: TOTAL SHOULDER ARTHROPLASTY;  Surgeon: Metta Clines Supple;  Location: [facility] [place];  Service: Orthopedics;  Laterality: Right;  . UPPER GASTROINTESTINAL ENDOSCOPY    . Vaginal  Sling      There were no vitals filed for this visit.  Subjective Assessment - 10/25/19 1032    Subjective  COVID-19 screen performed prior to patient entering clinic. Sheets touching her knee is still very aggravating to the touch.    Pertinent History  OSA, TSA, OA, HTN, Anemia.    Patient Stated Goals  Get back to normal life.    Currently in Pain?  Yes    Pain Location  Knee    Pain Orientation  Right    Pain Descriptors / Indicators  Sore;Discomfort    Pain Type  Surgical pain    Pain Onset  More than a month ago    Pain Frequency  Intermittent         [facility] PT Assessment - 10/25/19 0001      Assessment   Medical Diagnosis  Right total knee replacement.    Referring Provider (PT)  Paralee Cancel MD    Onset Date/Surgical Date  08/17/19    Next MD Visit  2021      Restrictions   Weight Bearing Restrictions  No  [facility] Adult PT Treatment/Exercise - 10/25/19 0001      Knee/Hip Exercises: Aerobic   Nustep  L5 x10 min      Knee/Hip Exercises: Standing   Hip Abduction  AROM;Both;2 sets;10 reps;Knee straight    Forward Step Up  Right;2 sets;10 reps;Hand Hold: 2;Step Height: 6"    Rocker Board  3 minutes      Knee/Hip Exercises: Seated   Long Arc Quad  Strengthening;Right;2 sets;10 reps    Long Arc Quad Weight  3 lbs.    Clamshell with TheraBand  Red   x20 reps     Knee/Hip Exercises: Supine   Straight Leg Raises  AROM;Right;2 sets;10 reps      Modalities   Modalities  Theme park manager Action  IFC    Electrical Stimulation Parameters  80-150 hz x15 min    Electrical Stimulation Goals  Pain;Edema      Vasopneumatic   Number Minutes Vasopneumatic   15 minutes    Vasopnuematic Location   Knee    Vasopneumatic Pressure  Low    Vasopneumatic Temperature   34               PT Short Term Goals - 08/20/19 1502       PT SHORT TERM GOAL #1   Title  STG's=LTG's.        PT Long Term Goals - 10/14/19 1145      PT LONG TERM GOAL #6   Title  5/5 right knee strength.    Time  6    Period  Weeks    Status  New            Plan - 10/25/19 1109    Clinical Impression Statement  Patient presented in clinic with continued sensitivity to light touch. Patient progressed through functional strengthening with no complaints of pain or discomfort. Normal modalities response noted following removal of the modalities.    Personal Factors and Comorbidities  Comorbidity 1;Comorbidity 2    Comorbidities  OSA, TSA, OA, HTN, Anemia.    Examination-Activity Limitations  Other;Transfers;Locomotion Level;Stairs;Squat    Examination-Participation Restrictions  Personal Finances;Meal Prep    Stability/Clinical Decision Making  Stable/Uncomplicated    Rehab Potential  Good    PT Frequency  3x / week    PT Duration  4 weeks    PT Treatment/Interventions  ADLs/Self Care Home Management;Cryotherapy;Electrical Stimulation;Gait training;Stair training;Functional mobility training;Therapeutic activities;Therapeutic exercise;Neuromuscular re-education;Manual techniques;Patient/family education;Passive range of motion;Vasopneumatic Device    PT Next Visit Plan  Complete FOTO at next viisit (10/27/2019)    Consulted and Agree with Plan of Care  Patient       Patient will benefit from skilled therapeutic intervention in order to improve the following deficits and impairments:  Pain, Decreased activity tolerance, Decreased strength, Increased edema, Decreased range of motion  Visit Diagnosis: Stiffness of right knee, not elsewhere classified  Chronic pain of right knee  Localized edema  Muscle weakness (generalized)     Problem List Patient Active Problem List   Diagnosis Date Noted  . S/P left TKA 08/17/2019  . Status post total right knee replacement 08/17/2019  . OAB (overactive bladder) 06/22/2019  . ILD  (interstitial lung disease) ([place]) 12/15/2018  . Enlarged heart 06/16/2018  . Morbid obesity ([place]) 10/17/2017  . 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 Hot flashes, menopausal 12/31/2016  . DDD (degenerative disc disease),  lumbar 11/06/2015  . Right-sided low back pain without sciatica 06/21/2015  . Diastolic dysfunction without heart failure 11/13/2014  . Other abnormal glucose 05/27/2013  . Osteopenia of the elderly 05/27/2013  . Hyperlipidemia LDL goal <130 09/04/2012  . Esophageal stricture 09/12/2011  . Visit for screening mammogram 08/08/2011  . Routine general medical examination at a health care facility 08/08/2011  . Essential hypertension, benign 01/14/2011  . URINARY INCONTINENCE 12/14/2008  . OA (osteoarthritis) 07/21/2008  . Obstructive sleep apnea 10/14/2007  . GERD 10/14/2007    Claudie Revering PTA [facility] [address] [place], [place], [address] Phone: [phone]   Fax:  [phone]  Name: Veronica Garza MRN: [id number] Date of Birth: [birth year]  Progress Note Reporting Period 08/20/19 to 10/25/19  See note below for Objective Data and Assessment of Progress/Goals. Patient has made excellent progress with regards to improved right knee range of motion.  The patient is expected to meet goals.    [place]ali Applegate MPT

## 2019-10-27 ENCOUNTER — Other Ambulatory Visit: Payer: Self-pay

## 2019-10-27 ENCOUNTER — Encounter: Payer: Self-pay | Admitting: Physical Therapy

## 2019-10-27 ENCOUNTER — Ambulatory Visit: Payer: Medicare Other | Admitting: Physical Therapy

## 2019-10-27 DIAGNOSIS — M25561 Pain in right knee: Secondary | ICD-10-CM | POA: Diagnosis not present

## 2019-10-27 DIAGNOSIS — M6281 Muscle weakness (generalized): Secondary | ICD-10-CM | POA: Diagnosis not present

## 2019-10-27 DIAGNOSIS — M25661 Stiffness of right knee, not elsewhere classified: Secondary | ICD-10-CM | POA: Diagnosis not present

## 2019-10-27 DIAGNOSIS — G8929 Other chronic pain: Secondary | ICD-10-CM

## 2019-10-27 DIAGNOSIS — R600 Localized edema: Secondary | ICD-10-CM

## 2019-10-27 NOTE — Therapy (Signed)
[facility] [place], [place], [address] Phone: [phone]   Fax:  [phone]  Physical Therapy Treatment  Patient Details  Name: Veronica Garza MRN: [id number] Date of Birth: [birth year] Referring Provider (PT): Paralee Cancel MD   Encounter Date: 10/27/2019  PT End of Session - 10/27/19 1222    Visit Number  21    Number of Visits  24    Date for PT Re-Evaluation  11/18/19    Authorization Type  FOTO AT LEAST EVERY 5TH VISIT.  PROGRESS NOTE AT 10TH VISIT.  KX MODIFIER AFTER 15 VISITS.    PT Start Time  1031    PT Stop Time  1122    PT Time Calculation (min)  51 min    Activity Tolerance  Patient tolerated treatment well    Behavior During Therapy  WFL for tasks assessed/performed       Past Medical History:  Diagnosis Date  . Anemia   . Arthritis   . BCC (basal cell carcinoma) 07/19/2014   Right cheek  . Cataract   . Complication of anesthesia    takes patient a long time to wake up  . GERD (gastroesophageal reflux disease)   . Hx of shoulder replacement    lt  . Hypertension   . OSA (obstructive sleep apnea)    use CPAP  . Pneumonia    History of  . Urinary incontinence     Past Surgical History:  Procedure Laterality Date  . ABDOMINAL HYSTERECTOMY    . EYE SURGERY     bilateral cataract extraction  . FINGER SURGERY     cyst left middle finger  . JOINT REPLACEMENT     left shoulder  . NASAL SINUS SURGERY    . SHOULDER ARTHROSCOPY     Left  . TOTAL KNEE ARTHROPLASTY Right 08/17/2019   Procedure: TOTAL KNEE ARTHROPLASTY;  Surgeon: Paralee Cancel, MD;  [facility]: [facility];  Service: Orthopedics;  Laterality: Right;  70 mins  . TOTAL SHOULDER ARTHROPLASTY  10/10/2011   Procedure: TOTAL SHOULDER ARTHROPLASTY;  Surgeon: Metta Clines Supple;  Location: [facility];  Service: Orthopedics;  Laterality: Right;  . UPPER GASTROINTESTINAL ENDOSCOPY    . Vaginal Sling      There were no vitals filed for this  visit.  Subjective Assessment - 10/27/19 1213    Subjective  COVID-19 screen performed prior to patient entering clinic.  Feels good today.    Pertinent History  OSA, TSA, OA, HTN, Anemia.    Patient Stated Goals  Get back to normal life.    Currently in Pain?  Yes    Pain Score  3     Pain Location  Knee    Pain Orientation  Right    Pain Descriptors / Indicators  Sore;Discomfort    Pain Type  Surgical pain    Pain Onset  More than a month ago                       [facility] Adult PT Treatment/Exercise - 10/27/19 0001      Exercises   Exercises  Knee/Hip      Knee/Hip Exercises: Aerobic   Recumbent Bike  Seat 2 x 12 minutes.    Nustep  level 5 x 3 minutes.      Knee/Hip Exercises: Machines for Strengthening   Cybex Knee Flexion  10# x 4 minutes.    Cybex Leg Press  20# x 4 minutes.  Modalities   Modalities  Electrical Stimulation;Vasopneumatic               PT Short Term Goals - 08/20/19 1502      PT SHORT TERM GOAL #1   Title  STG's=LTG's.        PT Long Term Goals - 10/14/19 1145      PT LONG TERM GOAL #6   Title  5/5 right knee strength.    Time  6    Period  Weeks    Status  New            Plan - 10/27/19 1220    Clinical Impression Statement  Patent did great today with progression to stationary bike and weight machines today.    Personal Factors and Comorbidities  Comorbidity 1;Comorbidity 2    Comorbidities  OSA, TSA, OA, HTN, Anemia.    Examination-Activity Limitations  Other;Transfers;Locomotion Level;Stairs;Squat    Examination-Participation Restrictions  Personal Finances;Meal Prep    Stability/Clinical Decision Making  Stable/Uncomplicated    Rehab Potential  Good    PT Frequency  3x / week    PT Duration  4 weeks    PT Treatment/Interventions  ADLs/Self Care Home Management;Cryotherapy;Electrical Stimulation;Gait training;Stair training;Functional mobility training;Therapeutic activities;Therapeutic  exercise;Neuromuscular re-education;Manual techniques;Patient/family education;Passive range of motion;Vasopneumatic Device    PT Next Visit Plan  Complete FOTO at next viisit (10/27/2019)    Consulted and Agree with Plan of Care  Patient       Patient will benefit from skilled therapeutic intervention in order to improve the following deficits and impairments:  Pain, Decreased activity tolerance, Decreased strength, Increased edema, Decreased range of motion  Visit Diagnosis: Stiffness of right knee, not elsewhere classified  Chronic pain of right knee  Localized edema     Problem List Patient Active Problem List   Diagnosis Date Noted  . S/P left TKA 08/17/2019  . Status post total right knee replacement 08/17/2019  . OAB (overactive bladder) 06/22/2019  . ILD (interstitial lung disease) ([place]) 12/15/2018  . Enlarged heart 06/16/2018  . Morbid obesity ([place]) 10/17/2017  . 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 Hot flashes, menopausal 12/31/2016  . DDD (degenerative disc disease), lumbar 11/06/2015  . Right-sided low back pain without sciatica 06/21/2015  . Diastolic dysfunction without heart failure 11/13/2014  . Other abnormal glucose 05/27/2013  . Osteopenia of the elderly 05/27/2013  . Hyperlipidemia LDL goal <130 09/04/2012  . Esophageal stricture 09/12/2011  . Visit for screening mammogram 08/08/2011  . Routine general medical examination at a health care facility 08/08/2011  . Essential hypertension, benign 01/14/2011  . URINARY INCONTINENCE 12/14/2008  . OA (osteoarthritis) 07/21/2008  . Obstructive sleep apnea 10/14/2007  . GERD 10/14/2007    Veronica Garza, Veronica Garza 10/27/2019, 12:22 PM  [facility] [address] [place], [place], [address] Phone: [phone]   Fax:  [phone]  Name: Veronica Garza MRN: [id number] Date of Birth: 1941/08/07

## 2019-11-01 ENCOUNTER — Inpatient Hospital Stay: Admission: RE | Admit: 2019-11-01 | Payer: Medicare Other | Source: Ambulatory Visit

## 2019-11-02 ENCOUNTER — Other Ambulatory Visit: Payer: Self-pay

## 2019-11-02 ENCOUNTER — Encounter: Payer: Self-pay | Admitting: Physical Therapy

## 2019-11-02 ENCOUNTER — Ambulatory Visit: Payer: Medicare Other | Attending: Orthopedic Surgery | Admitting: Physical Therapy

## 2019-11-02 DIAGNOSIS — G8929 Other chronic pain: Secondary | ICD-10-CM | POA: Diagnosis present

## 2019-11-02 DIAGNOSIS — R600 Localized edema: Secondary | ICD-10-CM | POA: Insufficient documentation

## 2019-11-02 DIAGNOSIS — M6281 Muscle weakness (generalized): Secondary | ICD-10-CM | POA: Diagnosis present

## 2019-11-02 DIAGNOSIS — M25561 Pain in right knee: Secondary | ICD-10-CM | POA: Insufficient documentation

## 2019-11-02 DIAGNOSIS — M25661 Stiffness of right knee, not elsewhere classified: Secondary | ICD-10-CM | POA: Diagnosis not present

## 2019-11-02 NOTE — Therapy (Signed)
[facility] [place], [place], [address] Phone: [phone]   Fax:  [phone]  Physical Therapy Treatment  Patient Details  Name: Veronica Garza MRN: [id number] Date of Birth: 09-Oct-1941 Referring Provider (PT): Paralee Cancel MD   Encounter Date: 11/02/2019  PT End of Session - 11/02/19 1139    Visit Number  22    Number of Visits  24    Date for PT Re-Evaluation  11/18/19    Authorization Type  FOTO AT LEAST EVERY 5TH VISIT.  PROGRESS NOTE AT 10TH VISIT.  KX MODIFIER AFTER 15 VISITS.    PT Start Time  1030    PT Stop Time  1125    PT Time Calculation (min)  55 min    Activity Tolerance  Patient tolerated treatment well    Behavior During Therapy  WFL for tasks assessed/performed       Past Medical History:  Diagnosis Date  . Anemia   . Arthritis   . BCC (basal cell carcinoma) 07/19/2014   Right cheek  . Cataract   . Complication of anesthesia    takes patient a long time to wake up  . GERD (gastroesophageal reflux disease)   . Hx of shoulder replacement    lt  . Hypertension   . OSA (obstructive sleep apnea)    use CPAP  . Pneumonia    History of  . Urinary incontinence     Past Surgical History:  Procedure Laterality Date  . ABDOMINAL HYSTERECTOMY    . EYE SURGERY     bilateral cataract extraction  . FINGER SURGERY     cyst left middle finger  . JOINT REPLACEMENT     left shoulder  . NASAL SINUS SURGERY    . SHOULDER ARTHROSCOPY     Left  . TOTAL KNEE ARTHROPLASTY Right 08/17/2019   Procedure: TOTAL KNEE ARTHROPLASTY;  Surgeon: Paralee Cancel, MD;  Location: [facility];  Service: Orthopedics;  Laterality: Right;  70 mins  . TOTAL SHOULDER ARTHROPLASTY  10/10/2011   Procedure: TOTAL SHOULDER ARTHROPLASTY;  Surgeon: Metta Clines Supple;  Location: [facility];  Service: Orthopedics;  Laterality: Right;  . UPPER GASTROINTESTINAL ENDOSCOPY    . Vaginal Sling      There were no vitals filed for this  visit.  Subjective Assessment - 11/02/19 1134    Subjective  COVID-19 screen performed prior to patient entering clinic. My knee feels stiff.    Pertinent History  OSA, TSA, OA, HTN, Anemia.    Patient Stated Goals  Get back to normal life.    Currently in Pain?  Yes    Pain Score  3     Pain Location  Knee    Pain Orientation  Right    Pain Descriptors / Indicators  Sore;Discomfort    Pain Type  Surgical pain    Pain Onset  More than a month ago                       [facility] Adult PT Treatment/Exercise - 11/02/19 0001      Exercises   Exercises  Knee/Hip      Knee/Hip Exercises: Aerobic   Recumbent Bike  15 minutes at level 1.      Modalities   Modalities  Electrical Stimulation;Moist Heat      Moist Heat Therapy   Number Minutes Moist Heat  20 Minutes    Moist Heat Location  --   Right knee.  Acupuncturist Location  Right knee.    Electrical Stimulation Action  IFC    Electrical Stimulation Parameters  80-150 Hz x 20 minutes.    Electrical Stimulation Goals  Pain      Manual Therapy   Manual Therapy  Soft tissue mobilization    Passive ROM  STW/M x 8 minutes to patient's right knee.               PT Short Term Goals - 08/20/19 1502      PT SHORT TERM GOAL #1   Title  STG's=LTG's.        PT Long Term Goals - 10/14/19 1145      PT LONG TERM GOAL #6   Title  5/5 right knee strength.    Time  6    Period  Weeks    Status  New            Plan - 11/02/19 1139    Clinical Impression Statement  Patient's CC today is thta of stiffness.  She did very well with STW/M with knee feeling very good after treatment.  She was able to perform bike very well today.  Sheis very pleased with her outcome thus far.    Personal Factors and Comorbidities  Comorbidity 1;Comorbidity 2    Comorbidities  OSA, TSA, OA, HTN, Anemia.    Examination-Activity Limitations  Other;Transfers;Locomotion Level;Stairs;Squat     Examination-Participation Restrictions  Personal Finances;Meal Prep    Stability/Clinical Decision Making  Stable/Uncomplicated    Rehab Potential  Good    PT Frequency  3x / week    PT Duration  4 weeks    PT Treatment/Interventions  ADLs/Self Care Home Management;Cryotherapy;Electrical Stimulation;Gait training;Stair training;Functional mobility training;Therapeutic activities;Therapeutic exercise;Neuromuscular re-education;Manual techniques;Patient/family education;Passive range of motion;Vasopneumatic Device    Consulted and Agree with Plan of Care  Patient       Patient will benefit from skilled therapeutic intervention in order to improve the following deficits and impairments:  Pain, Decreased activity tolerance, Decreased strength, Increased edema, Decreased range of motion  Visit Diagnosis: Stiffness of right knee, not elsewhere classified  Chronic pain of right knee  Localized edema     Problem List Patient Active Problem List   Diagnosis Date Noted  . S/P left TKA 08/17/2019  . Status post total right knee replacement 08/17/2019  . OAB (overactive bladder) 06/22/2019  . ILD (interstitial lung disease) ([place]) 12/15/2018  . Enlarged heart 06/16/2018  . Morbid obesity ([place]) 10/17/2017  . 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 Hot flashes, menopausal 12/31/2016  . DDD (degenerative disc disease), lumbar 11/06/2015  . Right-sided low back pain without sciatica 06/21/2015  . Diastolic dysfunction without heart failure 11/13/2014  . Other abnormal glucose 05/27/2013  . Osteopenia of the elderly 05/27/2013  . Hyperlipidemia LDL goal <130 09/04/2012  . Esophageal stricture 09/12/2011  . Visit for screening mammogram 08/08/2011  . Routine general medical examination at a health care facility 08/08/2011  . Essential hypertension, benign 01/14/2011  . URINARY INCONTINENCE 12/14/2008  . OA (osteoarthritis) 07/21/2008  . Obstructive sleep apnea 10/14/2007  . GERD  10/14/2007    APPLEGATE, [place] MPT 11/02/2019, 11:42 AM  [facility] [address] [place], [place], [address] Phone: [phone]   Fax:  [phone]  Name: Veronica Garza MRN: [id number] Date of Birth: 17-Feb-1941

## 2019-11-04 ENCOUNTER — Other Ambulatory Visit: Payer: Self-pay

## 2019-11-04 ENCOUNTER — Ambulatory Visit: Payer: Medicare Other | Admitting: Physical Therapy

## 2019-11-04 DIAGNOSIS — M25561 Pain in right knee: Secondary | ICD-10-CM

## 2019-11-04 DIAGNOSIS — M25661 Stiffness of right knee, not elsewhere classified: Secondary | ICD-10-CM | POA: Diagnosis not present

## 2019-11-04 DIAGNOSIS — G8929 Other chronic pain: Secondary | ICD-10-CM

## 2019-11-04 DIAGNOSIS — R600 Localized edema: Secondary | ICD-10-CM

## 2019-11-04 NOTE — Therapy (Signed)
[facility] [place], [place], [address] Phone: [phone]   Fax:  [phone]  Physical Therapy Treatment  Patient Details  Name: Veronica Garza MRN: [id number] Date of Birth: 10/25/1941 Referring Provider (PT): Paralee Cancel MD   Encounter Date: 11/04/2019  PT End of Session - 11/04/19 1146    Visit Number  23    Number of Visits  24    Date for PT Re-Evaluation  11/18/19    Authorization Type  FOTO AT LEAST EVERY 5TH VISIT.  PROGRESS NOTE AT 10TH VISIT.  KX MODIFIER AFTER 15 VISITS.    PT Start Time  1032    PT Stop Time  1121    PT Time Calculation (min)  49 min    Activity Tolerance  Patient tolerated treatment well    Behavior During Therapy  WFL for tasks assessed/performed       Past Medical History:  Diagnosis Date  . Anemia   . Arthritis   . BCC (basal cell carcinoma) 07/19/2014   Right cheek  . Cataract   . Complication of anesthesia    takes patient a long time to wake up  . GERD (gastroesophageal reflux disease)   . Hx of shoulder replacement    lt  . Hypertension   . OSA (obstructive sleep apnea)    use CPAP  . Pneumonia    History of  . Urinary incontinence     Past Surgical History:  Procedure Laterality Date  . ABDOMINAL HYSTERECTOMY    . EYE SURGERY     bilateral cataract extraction  . FINGER SURGERY     cyst left middle finger  . JOINT REPLACEMENT     left shoulder  . NASAL SINUS SURGERY    . SHOULDER ARTHROSCOPY     Left  . TOTAL KNEE ARTHROPLASTY Right 08/17/2019   Procedure: TOTAL KNEE ARTHROPLASTY;  Surgeon: Paralee Cancel, MD;  Location: [facility];  Service: Orthopedics;  Laterality: Right;  70 mins  . TOTAL SHOULDER ARTHROPLASTY  10/10/2011   Procedure: TOTAL SHOULDER ARTHROPLASTY;  Surgeon: Metta Clines Supple;  Location: [facility];  Service: Orthopedics;  Laterality: Right;  . UPPER GASTROINTESTINAL ENDOSCOPY    . Vaginal Sling      There were no vitals filed for this  visit.  Subjective Assessment - 11/04/19 1128    Subjective  COVID-19 screen performed prior to patient entering clinic. That last treatment helped a lot.    Pertinent History  OSA, TSA, OA, HTN, Anemia.    Patient Stated Goals  Get back to normal life.    Currently in Pain?  Yes    Pain Score  2     Pain Orientation  Right    Pain Descriptors / Indicators  Sore;Discomfort    Pain Type  Surgical pain    Pain Onset  More than a month ago                       [facility] Adult PT Treatment/Exercise - 11/04/19 0001      Exercises   Exercises  Knee/Hip      Knee/Hip Exercises: Aerobic   Recumbent Bike  15 minutes at level 1.      Modalities   Modalities  Electrical Stimulation;Moist Heat      Moist Heat Therapy   Number Minutes Moist Heat  20 Minutes    Moist Heat Location  --   Right knee.     Dealer  Stimulation   Electrical Stimulation Location  Right knee.    Electrical Stimulation Action  IFC    Electrical Stimulation Parameters  80-150 Hz x 20 minutes.    Electrical Stimulation Goals  Pain      Manual Therapy   Manual Therapy  Soft tissue mobilization    Passive ROM  STW/M x 8 minutes.                PT Short Term Goals - 08/20/19 1502      PT SHORT TERM GOAL #1   Title  STG's=LTG's.        PT Long Term Goals - 10/14/19 1145      PT LONG TERM GOAL #6   Title  5/5 right knee strength.    Time  6    Period  Weeks    Status  New            Plan - 11/04/19 1147    Clinical Impression Statement  Patient has done great with STW/M.  She reports less stiffness.    Personal Factors and Comorbidities  Comorbidity 1;Comorbidity 2    Comorbidities  OSA, TSA, OA, HTN, Anemia.    Examination-Activity Limitations  Other;Transfers;Locomotion Level;Stairs;Squat    Examination-Participation Restrictions  Personal Finances;Meal Prep    Stability/Clinical Decision Making  Stable/Uncomplicated    Rehab Potential  Good    PT Frequency  3x /  week    PT Duration  4 weeks    PT Treatment/Interventions  ADLs/Self Care Home Management;Cryotherapy;Electrical Stimulation;Gait training;Stair training;Functional mobility training;Therapeutic activities;Therapeutic exercise;Neuromuscular re-education;Manual techniques;Patient/family education;Passive range of motion;Vasopneumatic Device    PT Next Visit Plan  Goal assessment (reciprocating stair gait).    Consulted and Agree with Plan of Care  Patient       Patient will benefit from skilled therapeutic intervention in order to improve the following deficits and impairments:  Pain, Decreased activity tolerance, Decreased strength, Increased edema, Decreased range of motion  Visit Diagnosis: Stiffness of right knee, not elsewhere classified  Chronic pain of right knee  Localized edema     Problem List Patient Active Problem List   Diagnosis Date Noted  . S/P left TKA 08/17/2019  . Status post total right knee replacement 08/17/2019  . OAB (overactive bladder) 06/22/2019  . ILD (interstitial lung disease) ([place]) 12/15/2018  . Enlarged heart 06/16/2018  . Morbid obesity ([place]) 10/17/2017  . 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 Hot flashes, menopausal 12/31/2016  . DDD (degenerative disc disease), lumbar 11/06/2015  . Right-sided low back pain without sciatica 06/21/2015  . Diastolic dysfunction without heart failure 11/13/2014  . Other abnormal glucose 05/27/2013  . Osteopenia of the elderly 05/27/2013  . Hyperlipidemia LDL goal <130 09/04/2012  . Esophageal stricture 09/12/2011  . Visit for screening mammogram 08/08/2011  . Routine general medical examination at a health care facility 08/08/2011  . Essential hypertension, benign 01/14/2011  . URINARY INCONTINENCE 12/14/2008  . OA (osteoarthritis) 07/21/2008  . Obstructive sleep apnea 10/14/2007  . GERD 10/14/2007    Jamiee Milholland, [place] MPT 11/04/2019, 11:51 AM  [facility] [place], [place], [address] Phone: [phone]   Fax:  [phone]  Name: Veronica Garza MRN: [id number] Date of Birth: 1941/02/21

## 2019-11-05 ENCOUNTER — Ambulatory Visit (INDEPENDENT_AMBULATORY_CARE_PROVIDER_SITE_OTHER)
Admission: RE | Admit: 2019-11-05 | Discharge: 2019-11-05 | Disposition: A | Discharge status: Home / Self Care | Payer: Medicare Other | Source: Ambulatory Visit | Attending: Internal Medicine | Admitting: Internal Medicine

## 2019-11-05 DIAGNOSIS — Z780 Asymptomatic menopausal state: Secondary | ICD-10-CM | POA: Diagnosis not present

## 2019-11-05 LAB — HM DEXA SCAN: HM Dexa Scan: NORMAL

## 2019-11-09 ENCOUNTER — Ambulatory Visit: Payer: Medicare Other | Admitting: Physical Therapy

## 2019-11-09 ENCOUNTER — Encounter: Payer: Self-pay | Admitting: Physical Therapy

## 2019-11-09 ENCOUNTER — Other Ambulatory Visit: Payer: Self-pay

## 2019-11-09 DIAGNOSIS — M25661 Stiffness of right knee, not elsewhere classified: Secondary | ICD-10-CM | POA: Diagnosis not present

## 2019-11-09 DIAGNOSIS — M25561 Pain in right knee: Secondary | ICD-10-CM

## 2019-11-09 DIAGNOSIS — R600 Localized edema: Secondary | ICD-10-CM

## 2019-11-09 DIAGNOSIS — M6281 Muscle weakness (generalized): Secondary | ICD-10-CM

## 2019-11-09 DIAGNOSIS — G8929 Other chronic pain: Secondary | ICD-10-CM

## 2019-11-09 NOTE — Therapy (Addendum)
[facility] [place], [place], [address] Phone: [phone]   Fax:  [phone]  Physical Therapy Treatment  Patient Details  Name: Veronica Garza MRN: [id number] Date of Birth: [birth year] Referring Provider (PT): Paralee Cancel MD   Encounter Date: 11/09/2019  PT End of Session - 11/09/19 1042    Visit Number  24    Number of Visits  24    Date for PT Re-Evaluation  11/18/19    Authorization Type  FOTO AT LEAST EVERY 5TH VISIT.  PROGRESS NOTE AT 10TH VISIT.  KX MODIFIER AFTER 15 VISITS.    PT Start Time  1032    PT Stop Time  1122    PT Time Calculation (min)  50 min    Equipment Utilized During Treatment  Other (comment)   Hurrycane   Activity Tolerance  Patient tolerated treatment well    Behavior During Therapy  WFL for tasks assessed/performed       Past Medical History:  Diagnosis Date  . Anemia   . Arthritis   . BCC (basal cell carcinoma) 07/19/2014   Right cheek  . Cataract   . Complication of anesthesia    takes patient a long time to wake up  . GERD (gastroesophageal reflux disease)   . Hx of shoulder replacement    lt  . Hypertension   . OSA (obstructive sleep apnea)    use CPAP  . Pneumonia    History of  . Urinary incontinence     Past Surgical History:  Procedure Laterality Date  . ABDOMINAL HYSTERECTOMY    . EYE SURGERY     bilateral cataract extraction  . FINGER SURGERY     cyst left middle finger  . JOINT REPLACEMENT     left shoulder  . NASAL SINUS SURGERY    . SHOULDER ARTHROSCOPY     Left  . TOTAL KNEE ARTHROPLASTY Right 08/17/2019   Procedure: TOTAL KNEE ARTHROPLASTY;  Surgeon: Paralee Cancel, MD;  Location: [facility];  Service: Orthopedics;  Laterality: Right;  70 mins  . TOTAL SHOULDER ARTHROPLASTY  10/10/2011   Procedure: TOTAL SHOULDER ARTHROPLASTY;  Surgeon: Metta Clines Supple;  Location: [facility];  Service: Orthopedics;  Laterality: Right;  . UPPER GASTROINTESTINAL ENDOSCOPY    .  Vaginal Sling      There were no vitals filed for this visit.  Subjective Assessment - 11/09/19 1041    Subjective  COVID-19 screen performed prior to patient entering clinic. Reports driving for the first time today.    Pertinent History  OSA, TSA, OA, HTN, Anemia.    Patient Stated Goals  Get back to normal life.    Currently in Pain?  Yes    Pain Score  3     Pain Location  Knee    Pain Orientation  Right    Pain Descriptors / Indicators  Discomfort    Pain Type  Surgical pain    Pain Onset  More than a month ago    Pain Frequency  Intermittent         [facility] PT Assessment - 11/09/19 0001      Assessment   Medical Diagnosis  Right total knee replacement.    Referring Provider (PT)  Paralee Cancel MD    Onset Date/Surgical Date  08/17/19    Next MD Visit  2021      Restrictions   Weight Bearing Restrictions  No      Observation/Other Assessments   Focus  on Therapeutic Outcomes (FOTO)   39%, CJ status      ROM / Strength   AROM / PROM / Strength  Strength      Strength   Overall Strength  Within functional limits for tasks performed    Strength Assessment Site  Knee    Right/Left Hip  --    Right/Left Knee  Right    Right Knee Flexion  4+/5    Right Knee Extension  5/5                   [facility] Adult PT Treatment/Exercise - 11/09/19 0001      Knee/Hip Exercises: Aerobic   Recumbent Bike  L1 x15 min      Knee/Hip Exercises: Machines for Strengthening   Cybex Knee Extension  10# 3x10 reps    Cybex Knee Flexion  30# 3x10 reps      Knee/Hip Exercises: Supine   Straight Leg Raises  AROM;Right;3 sets;10 reps      Modalities   Modalities  Electrical Stimulation;Moist Heat      Moist Heat Therapy   Number Minutes Moist Heat  15 Minutes    Moist Heat Location  Knee      Electrical Stimulation   Electrical Stimulation Location  Right knee.    Electrical Stimulation Action  IFC    Electrical Stimulation Parameters  80-150 hz x15 min    Electrical  Stimulation Goals  Pain               PT Short Term Goals - 08/20/19 1502      PT SHORT TERM GOAL #1   Title  STG's=LTG's.        PT Long Term Goals - 11/09/19 1059      PT LONG TERM GOAL #1   Title  Independent with a HEP.    Baseline  11/05/18 required vc for technique (incr velocity) with VORx1    Time  4    Period  Weeks    Status  Achieved      PT LONG TERM GOAL #2   Title  Full right active knee extension in order to normalize gait.    Baseline  12/04/18 13.1 sec    Time  4    Period  Weeks    Status  Achieved      PT LONG TERM GOAL #3   Title  Active right knee flexion to 115 degrees+ so the patient can perform functional tasks and do so with pain not > 2-3/10.    Baseline  11/05/18 with SPC with triangle tip; minguard assist with cues for technique/sequencing    Time  4    Period  Weeks    Status  Achieved      PT LONG TERM GOAL #4   Title  Increase right knee strength to a solid 4+/5 to provide good stability for accomplishment of functional activities.    Baseline  5/10    Time  4    Period  Weeks    Status  Achieved      PT LONG TERM GOAL #5   Title  Perform a reciprocating stair gait with one railing with pain not > 2-3/10.    Time  4    Period  Weeks    Status  Not Met   Limited per patient report by previous ankle injuries     PT LONG TERM GOAL #6   Title  5/5 right knee strength.  Time  6    Period  Weeks    Status  Partially Met            Plan - 11/09/19 1119    Clinical Impression Statement  Patient presented in clinic with only minimal discomfort in R knee. Patient reports some stiffness upon arrival. Patient encouraged to continue walking along her driveway and then progress to the end of the street when she feels comfortable. R knee MMT 4+/5-5/5 and no pain reported during assessment. Limited with stair LTG due to previous ankle injuries per patient report. Normal modalities response noted following removal of the modalities.     Personal Factors and Comorbidities  Comorbidity 1;Comorbidity 2    Comorbidities  OSA, TSA, OA, HTN, Anemia.    Examination-Activity Limitations  Other;Transfers;Locomotion Level;Stairs;Squat    Examination-Participation Restrictions  Personal Finances;Meal Prep    Stability/Clinical Decision Making  Stable/Uncomplicated    Rehab Potential  Good    PT Frequency  3x / week    PT Duration  4 weeks    PT Treatment/Interventions  ADLs/Self Care Home Management;Cryotherapy;Electrical Stimulation;Gait training;Stair training;Functional mobility training;Therapeutic activities;Therapeutic exercise;Neuromuscular re-education;Manual techniques;Patient/family education;Passive range of motion;Vasopneumatic Device    PT Next Visit Plan  D/C summary required.    Consulted and Agree with Plan of Care  Patient       Patient will benefit from skilled therapeutic intervention in order to improve the following deficits and impairments:  Pain, Decreased activity tolerance, Decreased strength, Increased edema, Decreased range of motion  Visit Diagnosis: Stiffness of right knee, not elsewhere classified  Chronic pain of right knee  Localized edema  Muscle weakness (generalized)     Problem List Patient Active Problem List   Diagnosis Date Noted  . S/P left TKA 08/17/2019  . Status post total right knee replacement 08/17/2019  . OAB (overactive bladder) 06/22/2019  . ILD (interstitial lung disease) ([place]) 12/15/2018  . Enlarged heart 06/16/2018  . Morbid obesity ([place]) 10/17/2017  . 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 Hot flashes, menopausal 12/31/2016  . DDD (degenerative disc disease), lumbar 11/06/2015  . Right-sided low back pain without sciatica 06/21/2015  . Diastolic dysfunction without heart failure 11/13/2014  . Other abnormal glucose 05/27/2013  . Osteopenia of the elderly 05/27/2013  . Hyperlipidemia LDL goal <130 09/04/2012  . Esophageal stricture 09/12/2011  . Visit for  screening mammogram 08/08/2011  . Routine general medical examination at a health care facility 08/08/2011  . Essential hypertension, benign 01/14/2011  . URINARY INCONTINENCE 12/14/2008  . OA (osteoarthritis) 07/21/2008  . Obstructive sleep apnea 10/14/2007  . GERD 10/14/2007    Standley Brooking, PTA 11/09/2019, 11:30 AM  [facility] [address] [place], [place], [address] Phone: [phone]   Fax:  [phone]  Name: KIARRAH RAUSCH MRN: [id number] Date of Birth: 08/22/1941  PHYSICAL THERAPY DISCHARGE SUMMARY  Visits from Start of Care: 24  Current functional level related to goals / functional outcomes: See above.    Remaining deficits: See goal section.   Education / Equipment: HEP. Plan: Patient agrees to discharge.  Patient goals were partially met. Patient is being discharged due to the physician's request.  ?????         Mali Applegate MPT

## 2019-11-10 ENCOUNTER — Telehealth: Payer: Self-pay

## 2019-11-10 NOTE — Telephone Encounter (Signed)
Form completed and mailed per pt request.   Pt informed of same.

## 2019-12-16 DIAGNOSIS — Z23 Encounter for immunization: Secondary | ICD-10-CM | POA: Diagnosis not present

## 2020-01-14 DIAGNOSIS — Z23 Encounter for immunization: Secondary | ICD-10-CM | POA: Diagnosis not present

## 2020-01-19 ENCOUNTER — Telehealth: Payer: Self-pay | Admitting: Internal Medicine

## 2020-01-19 NOTE — Chronic Care Management (AMB) (Signed)
  Chronic Care Management   Note  01/19/2020 Name: Veronica Garza MRN: [id number] DOB: 1941-10-04  Veronica Garza is a 79 y.o. year old female who is a primary care patient of Janith Lima, MD. I reached out to Lavonda Jumbo by phone today in response to a referral sent by [name]'s PCP, Janith Lima, MD.   [name] was given information about Chronic Care Management services today including:  1. CCM service includes personalized support from designated clinical staff supervised by her physician, including individualized plan of care and coordination with other care providers 2. 24/7 contact phone numbers for assistance for urgent and routine care needs. 3. Service will only be billed when office clinical staff spend 20 minutes or more in a month to coordinate care. 4. Only one practitioner may furnish and bill the service in a calendar month. 5. The patient may stop CCM services at any time (effective at the end of the month) by phone call to the office staff. 6. The patient will be responsible for cost sharing (co-pay) of up to 20% of the service fee (after annual deductible is met).  Patient agreed to services and verbal consent obtained.   Follow up plan:   [name] [employer] Scheduler

## 2020-01-25 NOTE — Chronic Care Management (AMB) (Signed)
Chronic Care Management Pharmacy  Name: MAUREEN KATZEN  MRN: [id number] DOB: 1941/04/15   Chief Complaint/ HPI  Veronica Garza,  79 y.o. , female presents for their Initial CCM visit with the clinical pharmacist via telephone due to COVID-19 Pandemic.  PCP : Janith Lima, MD  Their chronic conditions include: HTN, HLD, ILD, OSA, GERD, OA, urinary incontinence   Had COVID in Oct 2020, still doesn't have full sense of smell left.   Office Visits: 07/19/19 Dr Ronnald Ramp: surgical clearance visit. BP controlled. No med changes, cleared for surgery (TKA).   05/19/19 Dr Ronnald Ramp: switched Earnest Rosier to irbesartan d/t insurance coverage. Started rosuvastatin d/t elevated ASCVD risk.   Consult Visit: 08/17/19 admission for R TKA.  Outpatient rehab through December 2020.   Medications: Outpatient Encounter Medications as of 01/26/2020  Medication Sig  . Calcium Carbonate-Vitamin D (CALTRATE 600+D) 600-400 MG-UNIT per tablet Take 1 tablet by mouth 2 (two) times daily.   . diclofenac Sodium (VOLTAREN) 1 % GEL Apply topically as needed (apply to arthritic areas).  Marland Kitchen ibuprofen (ADVIL) 200 MG tablet Take 600 mg by mouth at bedtime.  . irbesartan (AVAPRO) 300 MG tablet Take 1 tablet (300 mg total) by mouth daily.  . Lidocaine HCl-Benzyl Alcohol (SALONPAS LIDOCAINE PLUS) 4-10 % CREA Apply topically. Apply to knee prn  . Multiple Vitamins-Minerals (MULTIVITAMIN WITH MINERALS) tablet Take 1 tablet by mouth daily.  . Multiple Vitamins-Minerals (PRESERVISION AREDS 2 PO) Take 1 capsule by mouth 2 (two) times daily.  Marland Kitchen omeprazole (PRILOSEC) 40 MG capsule TAKE (1) CAPSULE DAILY (Patient taking differently: Take 40 mg by mouth daily. )  . PARoxetine (PAXIL) 10 MG tablet TAKE 1 TABLET DAILY  . Propylene Glycol (SYSTANE BALANCE) 0.6 % SOLN Place 1 drop into both eyes 3 (three) times daily as needed (dry eyes).  . rosuvastatin (CRESTOR) 20 MG tablet Take 1 tablet (20 mg total) by mouth daily.  Marland Kitchen tolterodine  (DETROL LA) 2 MG 24 hr capsule Take 1 capsule (2 mg total) by mouth daily.  . TURMERIC PO Take 1,000 mg by mouth daily. Take one by mouth daily   . docusate sodium (COLACE) 100 MG capsule Take 1 capsule (100 mg total) by mouth 2 (two) times daily.  . ferrous sulfate (FERROUSUL) 325 (65 FE) MG tablet Take 1 tablet (325 mg total) by mouth 3 (three) times daily with meals for 14 days.  Marland Kitchen HYDROcodone-acetaminophen (NORCO) 7.5-325 MG tablet Take 1-2 tablets by mouth every 4 (four) hours as needed for moderate pain.  . methocarbamol (ROBAXIN) 500 MG tablet Take 1 tablet (500 mg total) by mouth every 6 (six) hours as needed for muscle spasms.  . polyethylene glycol (MIRALAX / GLYCOLAX) 17 g packet Take 17 g by mouth 2 (two) times daily.   No facility-administered encounter medications on file as of 01/26/2020.     Current Diagnosis/Assessment:  Goals Addressed            This Visit's Progress   . Pharmacy Care Plan       Current Barriers:  . Chronic Disease Management support, education, and care coordination needs related to HTN, HLD, and urinary incontinence  Pharmacist Clinical Goal(s):  Marland Kitchen Maintain BP < 140/90 . Reduce cardiovascular risk  . Ensure safety, efficacy, and affordability of medications  Interventions: . Comprehensive medication review performed. Marland Kitchen Restart rosuvastatin 20 mg for heart disease prevention . Discussed blood pressure issues with daily ibuprofen use  Patient Self Care Activities:  . Self administers  medications as prescribed, Calls pharmacy for medication refills, and Calls provider office for new concerns or questions  Initial goal documentation       Hypertension   Office blood pressures are  BP Readings from Last 3 Encounters:  08/18/19 (!) 115/53  08/12/19 (!) 153/69  07/19/19 134/76   Lab Results  Component Value Date   CREATININE 0.92 08/18/2019   CREATININE 1.01 (H) 08/17/2019   CREATININE 1.10 (H) 08/12/2019    Patient has failed  these meds in the past: azilsartan, losartan, furosemide,   Patient is currently controlled on the following meds: irbesartan 300 mg daily  Patient checks BP at home 1-2x per week   Patient home BP readings are ranging: 130/50  We discussed benefits of taking BP med at night, hypertensive risks with daily ibuprofen use. Pt is trying to eat healthy, avoiding salt and eating more veggies.  Plan  Continue current medications and control with diet and exercise  Monitor BP several times weekly  Hyperlipidemia   Lipid Panel     Component Value Date/Time   CHOL 185 05/19/2019 1425   TRIG 187.0 (H) 05/19/2019 1425   HDL 60.30 05/19/2019 1425   CHOLHDL 3 05/19/2019 1425   VLDL 37.4 05/19/2019 1425   LDLCALC 88 05/19/2019 1425     ASCVD 10-year risk: 21%  Patient has failed these meds in past: atorvastatin  Patient is currently controlled on the following medications: rosuvastatin 20 mg daily  We discussed: role of cholesterol in the body, bad vs good cholesterol, how statins work, benefits of statins outside of lowering cholesterol. Discussed myalgia side effect, pt denies muscle issues while she was on the statin. Pt reports her prescription for rosuvastatin ran out about a month ago and has not tried to renew rx. Pt agreed to start taking it again if refills are sent.  Plan   Continue control with diet and exercise  Refill rosuvastatin 20 mg daily   Urinary incontinence   Patient has failed these meds in past: Myrbetriq Patient is currently controlled on the following medications: tolterodine LA 2 mg daily  We discussed:  Pt still has to use bathroom every hour or so throughout the day and wears a pad, but she is not getting up throughout the night anymore which she reports is an improvement. She reports her insurance does not cover tolterodine but she is able to get it for $40/month at her [facility]in[facility] pharmacy. Discussed possibility of cheaper options, ie oxybutynin,  however given higher likelihood of side effects pt would like to continue with tolterodine  Plan  Continue current medications  Refill tolterodine  Menopausal hot flashes   Patient has failed these meds in past: n/a Patient is currently controlled on the following medications: paroxetine 10 mg  We discussed:  Pt has tried to stop paroxetine for about a month, hot flashes returned, so she will continue medication. She also reports it helps with sleep.  Plan  Continue current medications   GERD/hiatal hernia   Patient has failed these meds in past: esomeprazole, famotidine, rabeprazole Patient is currently controlled on the following medications: omeprazole 40 mg daily  We discussed:  Pt is very happy with omeprazole, improves her symptoms significantly.  Plan  Continue current medications    Arthritis/knee pain (s/p TKA 08/2019)   Patient has failed these meds in past: gabapentin Patient is currently controlled on the following medications: ibuprofen 600 mg HS, Salonpas cream, Turmeric, diclofenac gel  We discussed: pt uses ibuprofen, Salonpas and  turmeric daily and finds these help significantly with her pain, but does not completely relieve it. Discussed risks associated with daily ibuprofen use, pt voiced understanding but it is one of the few meds that helps so she will continue.  Plan  Continue current medications  Monitor BP and s/sx bleeding with daily NSAID use  Health Maintenance   Patient is currently controlled on the following medications:  calcium-vitamin D 600-400 BID, multivitamin, Preservision  We discussed: pt doing well with OTC regimen, denies issues. Discussed shingles vaccine, pt received Zostavax ~10 years ago but asked about new vaccine. Shingrix is recommended for patients who have already received Zostavax due to improved efficacy.  Plan  Continue current medications  Shingrix vaccine at [place]  Medication Management   Pt uses  [facility] for all medications - the pharmacist is her grandson's girlfriend April Uses AM/PM pill box, sets up each week  Pt endorses 100% compliance  We discussed:  Pt likes having a family member as her Software engineer, appreciates service at [employer]. All of her meds are covered by insurance except tolterodine, which April helps her get at cheap as possible  Plan  Continue current med management strategy    Follow up: 6 month phone visit or prn  Charlene Brooke, PharmD Clinical Pharmacist [facility] [phone]

## 2020-01-26 ENCOUNTER — Other Ambulatory Visit: Payer: Self-pay

## 2020-01-26 ENCOUNTER — Ambulatory Visit: Payer: Medicare Other | Admitting: Pharmacist

## 2020-01-26 DIAGNOSIS — N3281 Overactive bladder: Secondary | ICD-10-CM

## 2020-01-26 DIAGNOSIS — E785 Hyperlipidemia, unspecified: Secondary | ICD-10-CM

## 2020-01-26 DIAGNOSIS — I10 Essential (primary) hypertension: Secondary | ICD-10-CM

## 2020-01-26 NOTE — Patient Instructions (Addendum)
Visit Information  Thank you for meeting with me to discuss your medications! I look forward to working with you to achieve your health care goals. Below is a summary of what we talked about during the visit:  Goals Addressed            This Visit's Progress   . Pharmacy Care Plan       Current Barriers:  . Chronic Disease Management support, education, and care coordination needs related to HTN, HLD, and urinary incontinence  Pharmacist Clinical Goal(s):  Marland Kitchen Maintain BP < 140/90 . Reduce cardiovascular risk  . Ensure safety, efficacy, and affordability of medications  Interventions: . Comprehensive medication review performed. Marland Kitchen Restart rosuvastatin 20 mg for heart disease prevention . Discussed blood pressure issues with daily ibuprofen use  Patient Self Care Activities:  . Self administers medications as prescribed, Calls pharmacy for medication refills, and Calls provider office for new concerns or questions  Initial goal documentation       Veronica Garza was given information about Chronic Care Management services today including:  1. CCM service includes personalized support from designated clinical staff supervised by her physician, including individualized plan of care and coordination with other care providers 2. 24/7 contact phone numbers for assistance for urgent and routine care needs. 3. Service will only be billed when office clinical staff spend 20 minutes or more in a month to coordinate care. 4. Only one practitioner may furnish and bill the service in a calendar month. 5. The patient may stop CCM services at any time (effective at the end of the month) by phone call to the office staff. 6. The patient will be responsible for cost sharing (co-pay) of up to 20% of the service fee (after annual deductible is met).  Patient agreed to services and verbal consent obtained.   The patient verbalized understanding of instructions provided today and agreed to receive a  mailed copy of patient instruction and/or educational materials. Telephone follow up appointment with pharmacy team member scheduled for: 07/26/20 _0 :30am   Charlene Brooke, PharmD Clinical Pharmacist [facility] [phone]   Preventing High Cholesterol Cholesterol is a white, waxy substance similar to fat that the human body needs to help build cells. The liver makes all the cholesterol that a person's body needs. Having high cholesterol (hypercholesterolemia) increases a person's risk for heart disease and stroke. Extra (excess) cholesterol comes from the food the person eats. High cholesterol can often be prevented with diet and lifestyle changes. If you already have high cholesterol, you can control it with diet and lifestyle changes and with medicine. How can high cholesterol affect me? If you have high cholesterol, deposits (plaques) may build up on the walls of your arteries. The arteries are the blood vessels that carry blood away from your heart. Plaques make the arteries narrower and stiffer. This can limit or block blood flow and cause blood clots to form. Blood clots:  Are tiny balls of cells that form in your blood.  Can move to the heart or brain, causing a heart attack or stroke. Plaques in arteries greatly increase your risk for heart attack and stroke.Making diet and lifestyle changes can reduce your risk for these conditions that may threaten your life. What can increase my risk? This condition is more likely to develop in people who:  Eat foods that are high in saturated fat or cholesterol. Saturated fat is mostly found in: ? Foods that contain animal fat, such as red  meat and some dairy products. ? Certain fatty foods made from plants, such as tropical oils.  Are overweight.  Are not getting enough exercise.  Have a family history of high cholesterol. What actions can I take to prevent this? Nutrition   Eat less saturated  fat.  Avoid trans fats (partially hydrogenated oils). These are often found in margarine and in some baked goods, fried foods, and snacks bought in packages.  Avoid precooked or cured meat, such as sausages or meat loaves.  Avoid foods and drinks that have added sugars.  Eat more fruits, vegetables, and whole grains.  Choose healthy sources of protein, such as fish, poultry, lean cuts of red meat, beans, peas, lentils, and nuts.  Choose healthy sources of fat, such as: ? Nuts. ? Vegetable oils, especially olive oil. ? Fish that have healthy fats (omega-3 fatty acids), such as mackerel or salmon. The items listed above may not be a complete list of recommended foods and beverages. Contact a dietitian for more information. Lifestyle  Lose weight if you are overweight. Losing 5-10 lb (2.3-4.5 kg) can help prevent or control high cholesterol. It can also lower your risk for diabetes and high blood pressure. Ask your health care provider to help you with a diet and exercise plan to lose weight safely.  Do not use any products that contain nicotine or tobacco, such as cigarettes, e-cigarettes, and chewing tobacco. If you need help quitting, ask your health care provider.  Limit your alcohol intake. ? Do not drink alcohol if:  Your health care provider tells you not to drink.  You are pregnant, may be pregnant, or are planning to become pregnant. ? If you drink alcohol:  Limit how much you use to:  0-1 drink a day for women.  0-2 drinks a day for men.  Be aware of how much alcohol is in your drink. In the [place] one drink equals one 12 oz bottle of beer (355 mL), one 5 oz glass of wine (148 mL), or one 1 oz glass of hard liquor (44 mL). Activity   Get enough exercise. Each week, do at least 150 minutes of exercise that takes a medium level of effort (moderate-intensity exercise). ? This is exercise that:  Makes your heart beat faster and makes you breathe harder than  usual.  Allows you to still be able to talk. ? You could exercise in short sessions several times a day or longer sessions a few times a week. For example, on 5 days each week, you could walk fast or ride your bike 3 times a day for 10 minutes each time.  Do exercises as told by your health care provider. Medicines  In addition to diet and lifestyle changes, your health care provider may recommend medicines to help lower cholesterol. This may be a medicine to lower the amount of cholesterol your liver makes. You may need medicine if: ? Diet and lifestyle changes do not lower your cholesterol enough. ? You have high cholesterol and other risk factors for heart disease or stroke.  Take over-the-counter and prescription medicines only as told by your health care provider. General information  Manage your risk factors for high cholesterol. Talk with your health care provider about all your risk factors and how to lower your risk.  Manage other conditions that you have, such as diabetes or high blood pressure (hypertension).  Have blood tests to check your cholesterol levels at regular points in time as told by your health care  provider.  Keep all follow-up visits as told by your health care provider. This is important. Where to find more information  American Heart Association: www.heart.org  National Heart, Lung, and Blood Institute: https://wilson-eaton.com/ Summary  High cholesterol increases your risk for heart disease and stroke. By keeping your cholesterol level low, you can reduce your risk for these conditions.  High cholesterol can often be prevented with diet and lifestyle changes.  Work with your health care provider to manage your risk factors, and have your blood tested regularly. This information is not intended to replace advice given to you by your health care provider. Make sure you discuss any questions you have with your health care provider. Document Revised: 03/12/2019  Document Reviewed: 07/27/2016 Elsevier Patient Education  2020 [employer].

## 2020-01-26 NOTE — Addendum Note (Signed)
Addended by: Johney Frame, Adelina Mings M on: 01/26/2020 09:00 PM   Modules accepted: Orders

## 2020-01-26 NOTE — Telephone Encounter (Signed)
-----   Message from Charlton Haws, [facility] sent at 01/26/2020 10:48 AM EST ----- Regarding: Refills I just spoke with this patient, she needs refills of rosuvastatin and tolterodine sent to [facility] in [place]. Can you order those for her?

## 2020-01-27 DIAGNOSIS — Z961 Presence of intraocular lens: Secondary | ICD-10-CM | POA: Diagnosis not present

## 2020-01-27 DIAGNOSIS — H524 Presbyopia: Secondary | ICD-10-CM | POA: Diagnosis not present

## 2020-01-27 DIAGNOSIS — H52221 Regular astigmatism, right eye: Secondary | ICD-10-CM | POA: Diagnosis not present

## 2020-01-27 DIAGNOSIS — H353131 Nonexudative age-related macular degeneration, bilateral, early dry stage: Secondary | ICD-10-CM | POA: Diagnosis not present

## 2020-01-27 DIAGNOSIS — H5212 Myopia, left eye: Secondary | ICD-10-CM | POA: Diagnosis not present

## 2020-01-27 DIAGNOSIS — H04123 Dry eye syndrome of bilateral lacrimal glands: Secondary | ICD-10-CM | POA: Diagnosis not present

## 2020-01-27 DIAGNOSIS — H52222 Regular astigmatism, left eye: Secondary | ICD-10-CM | POA: Diagnosis not present

## 2020-01-27 MED ORDER — ROSUVASTATIN CALCIUM 20 MG PO TABS: 20.0000 mg | ORAL_TABLET | Freq: Every day | ORAL | 0 refills | Status: DC

## 2020-01-27 MED ORDER — TOLTERODINE TARTRATE ER 2 MG PO CP24: 2.0000 mg | ORAL_CAPSULE | Freq: Every day | ORAL | 0 refills | Status: DC

## 2020-01-27 NOTE — Telephone Encounter (Signed)
I can send in a 30 day supply. Pt is due for follow up with PCP and I will contact to get that scheduled.

## 2020-01-28 NOTE — Progress Notes (Signed)
  I have reviewed this encounter including the documentation in this note and/or discussed this patient with the care management provider. I am certifying that I agree with the content of this note as supervising physician.  Hanah Moultry, MD  01/28/2020 

## 2020-02-01 ENCOUNTER — Other Ambulatory Visit: Payer: Self-pay | Admitting: Internal Medicine

## 2020-02-29 ENCOUNTER — Other Ambulatory Visit: Payer: Self-pay | Admitting: Internal Medicine

## 2020-02-29 DIAGNOSIS — N3281 Overactive bladder: Secondary | ICD-10-CM

## 2020-02-29 DIAGNOSIS — E785 Hyperlipidemia, unspecified: Secondary | ICD-10-CM

## 2020-04-19 ENCOUNTER — Other Ambulatory Visit: Payer: Self-pay | Admitting: Internal Medicine

## 2020-04-19 DIAGNOSIS — E785 Hyperlipidemia, unspecified: Secondary | ICD-10-CM

## 2020-04-19 MED ORDER — ROSUVASTATIN CALCIUM 20 MG PO TABS: 20.0000 mg | ORAL_TABLET | Freq: Every day | ORAL | 1 refills | Status: DC

## 2020-05-15 ENCOUNTER — Telehealth: Payer: Self-pay

## 2020-05-15 NOTE — Telephone Encounter (Signed)
Last OV with PCP 07/22/2019

## 2020-05-15 NOTE — Telephone Encounter (Signed)
New message    The patient and her husband are taking the red-eye to [place] asking for something to help relax during the flight.    [facility] [address], [address]

## 2020-05-16 ENCOUNTER — Other Ambulatory Visit: Payer: Self-pay | Admitting: Internal Medicine

## 2020-05-16 DIAGNOSIS — G4725 Circadian rhythm sleep disorder, jet lag type: Secondary | ICD-10-CM | POA: Insufficient documentation

## 2020-05-16 MED ORDER — ZOLPIDEM TARTRATE 5 MG PO TABS: 5.0000 mg | ORAL_TABLET | Freq: Every evening | ORAL | 0 refills | Status: DC | PRN

## 2020-05-16 NOTE — Telephone Encounter (Signed)
Spoke to pt and she is requesting something for sleep sent to [facility] in [place].

## 2020-06-28 ENCOUNTER — Other Ambulatory Visit: Payer: Self-pay | Admitting: Gastroenterology

## 2020-06-28 NOTE — Telephone Encounter (Signed)
Patient needs a follow up appointment.

## 2020-07-03 ENCOUNTER — Other Ambulatory Visit: Payer: Self-pay | Admitting: Internal Medicine

## 2020-07-03 DIAGNOSIS — I10 Essential (primary) hypertension: Secondary | ICD-10-CM

## 2020-07-26 ENCOUNTER — Ambulatory Visit: Payer: Medicare Other | Admitting: Pharmacist

## 2020-07-26 ENCOUNTER — Other Ambulatory Visit: Payer: Self-pay

## 2020-07-26 DIAGNOSIS — E785 Hyperlipidemia, unspecified: Secondary | ICD-10-CM

## 2020-07-26 DIAGNOSIS — I10 Essential (primary) hypertension: Secondary | ICD-10-CM

## 2020-07-26 NOTE — Chronic Care Management (AMB) (Signed)
Chronic Care Management Pharmacy  Name: Veronica Garza  MRN: [id number] DOB: [birth year]   Chief Complaint/ HPI  Veronica Garza,  79 y.o. , female presents for their Follow-Up CCM visit with the clinical pharmacist via telephone due to COVID-19 Pandemic.  PCP : Janith Lima, MD  Their chronic conditions include: HTN, HLD, ILD, OSA, GERD, OA, urinary incontinence   Had COVID in Oct 2020, still doesn't have full sense of smell back. Pt has recently returned from a [place] cruise and beach vacation, her BP was elevated with the stress of travelling but is now returning to normal.  Office Visits: 07/19/19 Dr Ronnald Ramp: surgical clearance visit. BP controlled. No med changes, cleared for surgery (TKA).   05/19/19 Dr Ronnald Ramp: switched Earnest Rosier to irbesartan d/t insurance coverage. Started rosuvastatin d/t elevated ASCVD risk.   Consult Visit: 01/27/20 Dr Bing Plume (ophthalmology): f/u for macular degeneration, astigmatism, dry eye.  08/17/19 admission for R TKA.  Outpatient rehab through December 2020.   Medications: Outpatient Encounter Medications as of 07/26/2020  Medication Sig  . Calcium Carbonate-Vitamin D (CALTRATE 600+D) 600-400 MG-UNIT per tablet Take 1 tablet by mouth 2 (two) times daily.   . diclofenac Sodium (VOLTAREN) 1 % GEL Apply topically as needed (apply to arthritic areas).  Marland Kitchen docusate sodium (COLACE) 100 MG capsule Take 1 capsule (100 mg total) by mouth 2 (two) times daily.  Marland Kitchen HYDROcodone-acetaminophen (NORCO) 7.5-325 MG tablet Take 1-2 tablets by mouth every 4 (four) hours as needed for moderate pain.  Marland Kitchen ibuprofen (ADVIL) 200 MG tablet Take 600 mg by mouth at bedtime.  . irbesartan (AVAPRO) 300 MG tablet TAKE ONE TABLET BY MOUTH DAILY  . Lidocaine HCl-Benzyl Alcohol (SALONPAS LIDOCAINE PLUS) 4-10 % CREA Apply topically. Apply to knee prn  . methocarbamol (ROBAXIN) 500 MG tablet Take 1 tablet (500 mg total) by mouth every 6 (six) hours as needed for muscle spasms.  .  Multiple Vitamins-Minerals (MULTIVITAMIN WITH MINERALS) tablet Take 1 tablet by mouth daily.  . Multiple Vitamins-Minerals (PRESERVISION AREDS 2 PO) Take 1 capsule by mouth 2 (two) times daily.  Marland Kitchen omeprazole (PRILOSEC) 40 MG capsule TAKE ONE CAPSULE BY MOUTH DAILY  . PARoxetine (PAXIL) 10 MG tablet TAKE ONE TABLET BY MOUTH DAILY  . polyethylene glycol (MIRALAX / GLYCOLAX) 17 g packet Take 17 g by mouth 2 (two) times daily.  Marland Kitchen Propylene Glycol (SYSTANE BALANCE) 0.6 % SOLN Place 1 drop into both eyes 3 (three) times daily as needed (dry eyes).  . rosuvastatin (CRESTOR) 20 MG tablet Take 1 tablet (20 mg total) by mouth daily.  Marland Kitchen tolterodine (DETROL LA) 2 MG 24 hr capsule TAKE ONE CAPSULE BY MOUTH DAILY  . TURMERIC PO Take 1,000 mg by mouth daily. Take one by mouth daily   . zolpidem (AMBIEN) 5 MG tablet Take 1 tablet (5 mg total) by mouth at bedtime as needed for sleep.  . ferrous sulfate (FERROUSUL) 325 (65 FE) MG tablet Take 1 tablet (325 mg total) by mouth 3 (three) times daily with meals for 14 days.   No facility-administered encounter medications on file as of 07/26/2020.     Current Diagnosis/Assessment:  SDOH Interventions     Most Recent Value  SDOH Interventions  Financial Strain Interventions Intervention Not Indicated     Goals Addressed            This Visit's Progress   . Pharmacy Care Plan       CARE PLAN ENTRY (see longitudinal plan of care  for additional care plan information)  Current Barriers:  . Chronic Disease Management support, education, and care coordination needs related to Hypertension and Hyperlipidemia   Hypertension BP Readings from Last 3 Encounters:  08/18/19 (!) 115/53  08/12/19 (!) 153/69  07/19/19 134/76 .  Pharmacist Clinical Goal(s): o Over the next 180 days, patient will work with PharmD and providers to maintain BP goal <130/80 . Current regimen:  o Irbesartan 300 mg daily . Interventions: o Discussed BP goals and benefits of  medications for prevention of heart attack / stroke . Patient self care activities - Over the next 180 days, patient will: o Check BP daily, document, and provide at future appointments o Ensure daily salt intake < 2300 mg/day  Hyperlipidemia Lab Results  Component Value Date/Time   LDLCALC 88 05/19/2019 02:25 PM   LDLDIRECT 119.5 09/04/2012 09:32 AM .  Pharmacist Clinical Goal(s): o Over the next 180 days, patient will work with PharmD and providers to maintain LDL goal < 100 . Current regimen:  o Rosuvastatin 20 mg daily . Interventions: o Discussed cholesterol goals and benefits of medications for prevention of heart attack / stroke . Patient self care activities - Over the next 180 days, patient will: o Continue medication as prescribed  Medication management . Pharmacist Clinical Goal(s): o Over the next 180 days, patient will work with PharmD and providers to maintain optimal medication adherence . Current pharmacy: [facility] . Interventions o Comprehensive medication review performed. o Continue current medication management strategy . Patient self care activities - Over the next 180 days, patient will: o Focus on medication adherence by pill box o Take medications as prescribed o Report any questions or concerns to PharmD and/or provider(s)  Please see past updates related to this goal by clicking on the "Past Updates" button in the selected goal        Hypertension   BP goal < 130/80  Office blood pressures are  BP Readings from Last 3 Encounters:  08/18/19 (!) 115/53  08/12/19 (!) 153/69  07/19/19 134/76   Kidney Function Lab Results  Component Value Date/Time   CREATININE 0.92 08/18/2019 02:32 AM   CREATININE 1.01 (H) 08/17/2019 07:08 AM   GFR 88.49 05/10/2019 03:25 PM   GFRNONAA >60 08/18/2019 02:32 AM   GFRAA >60 08/18/2019 02:32 AM   K 4.5 08/18/2019 02:32 AM   K 4.2 08/17/2019 07:08 AM   Patient checks BP at home 1-2x per week    Patient home BP readings are ranging: 125/63, 119/72  Patient has failed these meds in the past: azilsartan, losartan, furosemide Patient is currently controlled on the following meds:   irbesartan 300 mg daily   We discussed: BP goals; benefits of medications; how stress/excitementn can temporarily increase BP  Plan  Continue current medications and control with diet and exercise   Hyperlipidemia   LDL goal < 100  Lipid Panel     Component Value Date/Time   CHOL 185 05/19/2019 1425   TRIG 187.0 (H) 05/19/2019 1425   HDL 60.30 05/19/2019 1425   CHOLHDL 3 05/19/2019 1425   VLDL 37.4 05/19/2019 1425   LDLCALC 88 05/19/2019 1425   LDLDIRECT 119.5 09/04/2012 0932    The 10-year ASCVD risk score Mikey Bussing DC Jr., et al., 2013) is: 21.4%   Values used to calculate the score:     Age: 15 years     Sex: Female     Is Non-Hispanic African American: No     Diabetic: No  Tobacco smoker: No     Systolic Blood Pressure: 888 mmHg     Is BP treated: Yes     HDL Cholesterol: 60.3 mg/dL     Total Cholesterol: 185 mg/dL  Patient has failed these meds in past: atorvastatin  Patient is currently controlled on the following medications:   rosuvastatin 20 mg daily  We discussed: cholesterol goals; benefits of statin for ASCVD risk reduction; pt is now taking statin as directed  Plan   Continue current medications and control with diet and exercise   Vaccines   Reviewed and discussed patient's vaccination history.    Immunization History  Administered Date(s) Administered  . Fluad Quad(high Dose 65+) 09/10/2019  . Influenza Split 09/04/2012  . Influenza Whole 08/30/2009, 09/18/2010, 08/08/2011  . Influenza, High Dose Seasonal PF 08/30/2014, 09/02/2016, 09/11/2017, 09/22/2018  . Influenza,inj,Quad PF,6+ Mos 09/02/2013, 09/01/2015  . Pneumococcal Conjugate-13 01/01/2018  . Pneumococcal Polysaccharide-23 05/19/2019  . Td 04/06/2010  . Tdap 07/19/2019  . Zoster 09/04/2012    We discussed: pt received both doses of Shingrix vaccine [employer].   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 All of her meds are covered by insurance except tolterodine, which April helps her get as cheap as possible  Plan  Continue current med management strategy Instructed patient to schedule annual PCP visit for refills    Follow up: 6 month phone visit or prn  Charlene Brooke, PharmD Clinical Pharmacist [facility] [phone]

## 2020-07-26 NOTE — Patient Instructions (Addendum)
Visit Information  Phone number for Pharmacist: [phone]  Goals Addressed            This Visit's Progress   . Pharmacy Care Plan       CARE PLAN ENTRY (see longitudinal plan of care for additional care plan information)  Current Barriers:  . Chronic Disease Management support, education, and care coordination needs related to Hypertension and Hyperlipidemia   Hypertension BP Readings from Last 3 Encounters:  08/18/19 (!) 115/53  08/12/19 (!) 153/69  07/19/19 134/76 .  Pharmacist Clinical Goal(s): o Over the next 180 days, patient will work with PharmD and providers to maintain BP goal <130/80 . Current regimen:  o Irbesartan 300 mg daily . Interventions: o Discussed BP goals and benefits of medications for prevention of heart attack / stroke . Patient self care activities - Over the next 180 days, patient will: o Check BP daily, document, and provide at future appointments o Ensure daily salt intake < 2300 mg/day  Hyperlipidemia Lab Results  Component Value Date/Time   LDLCALC 88 05/19/2019 02:25 PM   LDLDIRECT 119.5 09/04/2012 09:32 AM .  Pharmacist Clinical Goal(s): o Over the next 180 days, patient will work with PharmD and providers to maintain LDL goal < 100 . Current regimen:  o Rosuvastatin 20 mg daily . Interventions: o Discussed cholesterol goals and benefits of medications for prevention of heart attack / stroke . Patient self care activities - Over the next 180 days, patient will: o Continue medication as prescribed  Medication management . Pharmacist Clinical Goal(s): o Over the next 180 days, patient will work with PharmD and providers to maintain optimal medication adherence . Current pharmacy: [facility] . Interventions o Comprehensive medication review performed. o Continue current medication management strategy . Patient self care activities - Over the next 180 days, patient will: o Focus on medication adherence by pill box o Take  medications as prescribed o Report any questions or concerns to PharmD and/or provider(s)  Please see past updates related to this goal by clicking on the "Past Updates" button in the selected goal       Patient verbalizes understanding of instructions provided today.  Telephone follow up appointment with pharmacy team member scheduled for: 6 months  Charlene Brooke, PharmD, BCACP Clinical Pharmacist [facility] Maintenance After Age 41 After age 22, you are at a higher risk for certain long-term diseases and infections as well as injuries from falls. Falls are a major cause of broken bones and head injuries in people who are older than age 62. Getting regular preventive care can help to keep you healthy and well. Preventive care includes getting regular testing and making lifestyle changes as recommended by your health care provider. Talk with your health care provider about:  Which screenings and tests you should have. A screening is a test that checks for a disease when you have no symptoms.  A diet and exercise plan that is right for you. What should I know about screenings and tests to prevent falls? Screening and testing are the best ways to find a health problem early. Early diagnosis and treatment give you the best chance of managing medical conditions that are common after age 63. Certain conditions and lifestyle choices may make you more likely to have a fall. Your health care provider may recommend:  Regular vision checks. Poor vision and conditions such as cataracts can make you more likely to have a fall. If you  wear glasses, make sure to get your prescription updated if your vision changes.  Medicine review. Work with your health care provider to regularly review all of the medicines you are taking, including over-the-counter medicines. Ask your health care provider about any side effects that may make you more likely to have a  fall. Tell your health care provider if any medicines that you take make you feel dizzy or sleepy.  Osteoporosis screening. Osteoporosis is a condition that causes the bones to get weaker. This can make the bones weak and cause them to break more easily.  Blood pressure screening. Blood pressure changes and medicines to control blood pressure can make you feel dizzy.  Strength and balance checks. Your health care provider may recommend certain tests to check your strength and balance while standing, walking, or changing positions.  Foot health exam. Foot pain and numbness, as well as not wearing proper footwear, can make you more likely to have a fall.  Depression screening. You may be more likely to have a fall if you have a fear of falling, feel emotionally low, or feel unable to do activities that you used to do.  Alcohol use screening. Using too much alcohol can affect your balance and may make you more likely to have a fall. What actions can I take to lower my risk of falls? General instructions  Talk with your health care provider about your risks for falling. Tell your health care provider if: ? You fall. Be sure to tell your health care provider about all falls, even ones that seem minor. ? You feel dizzy, sleepy, or off-balance.  Take over-the-counter and prescription medicines only as told by your health care provider. These include any supplements.  Eat a healthy diet and maintain a healthy weight. A healthy diet includes low-fat dairy products, low-fat (lean) meats, and fiber from whole grains, beans, and lots of fruits and vegetables. Home safety  Remove any tripping hazards, such as rugs, cords, and clutter.  Install safety equipment such as grab bars in bathrooms and safety rails on stairs.  Keep rooms and walkways well-lit. Activity   Follow a regular exercise program to stay fit. This will help you maintain your balance. Ask your health care provider what types of  exercise are appropriate for you.  If you need a cane or walker, use it as recommended by your health care provider.  Wear supportive shoes that have nonskid soles. Lifestyle  Do not drink alcohol if your health care provider tells you not to drink.  If you drink alcohol, limit how much you have: ? 0-1 drink a day for women. ? 0-2 drinks a day for men.  Be aware of how much alcohol is in your drink. In the [place] one drink equals one typical bottle of beer (12 oz), one-half glass of wine (5 oz), or one shot of hard liquor (1 oz).  Do not use any products that contain nicotine or tobacco, such as cigarettes and e-cigarettes. If you need help quitting, ask your health care provider. Summary  Having a healthy lifestyle and getting preventive care can help to protect your health and wellness after age 36.  Screening and testing are the best way to find a health problem early and help you avoid having a fall. Early diagnosis and treatment give you the best chance for managing medical conditions that are more common for people who are older than age 71.  Falls are a major cause of broken bones and  head injuries in people who are older than age 87. Take precautions to prevent a fall at home.  Work with your health care provider to learn what changes you can make to improve your health and wellness and to prevent falls. This information is not intended to replace advice given to you by your health care provider. Make sure you discuss any questions you have with your health care provider. Document Revised: 03/11/2019 Document Reviewed: 10/01/2017 Elsevier Patient Education  2020 [employer].

## 2020-08-09 DIAGNOSIS — H04123 Dry eye syndrome of bilateral lacrimal glands: Secondary | ICD-10-CM | POA: Diagnosis not present

## 2020-08-09 DIAGNOSIS — H353131 Nonexudative age-related macular degeneration, bilateral, early dry stage: Secondary | ICD-10-CM | POA: Diagnosis not present

## 2020-08-09 DIAGNOSIS — Z961 Presence of intraocular lens: Secondary | ICD-10-CM | POA: Diagnosis not present

## 2020-08-10 ENCOUNTER — Ambulatory Visit: Payer: Medicare Other | Admitting: Internal Medicine

## 2020-08-17 ENCOUNTER — Ambulatory Visit (INDEPENDENT_AMBULATORY_CARE_PROVIDER_SITE_OTHER): Payer: Medicare Other | Admitting: Internal Medicine

## 2020-08-17 ENCOUNTER — Encounter: Payer: Self-pay | Admitting: Internal Medicine

## 2020-08-17 ENCOUNTER — Other Ambulatory Visit: Payer: Self-pay

## 2020-08-17 VITALS — BP 158/74 | HR 69 | Temp 98.0°F | Resp 16 | Ht 59.0 in | Wt 210.0 lb

## 2020-08-17 DIAGNOSIS — N3281 Overactive bladder: Secondary | ICD-10-CM | POA: Diagnosis not present

## 2020-08-17 DIAGNOSIS — R7309 Other abnormal glucose: Secondary | ICD-10-CM

## 2020-08-17 DIAGNOSIS — Z23 Encounter for immunization: Secondary | ICD-10-CM | POA: Diagnosis not present

## 2020-08-17 DIAGNOSIS — N951 Menopausal and female climacteric states: Secondary | ICD-10-CM

## 2020-08-17 DIAGNOSIS — E785 Hyperlipidemia, unspecified: Secondary | ICD-10-CM | POA: Diagnosis not present

## 2020-08-17 DIAGNOSIS — I10 Essential (primary) hypertension: Secondary | ICD-10-CM | POA: Diagnosis not present

## 2020-08-17 MED ORDER — GEMTESA 75 MG PO TABS: 1.0000 | ORAL_TABLET | Freq: Every day | ORAL | 1 refills | Status: DC

## 2020-08-17 MED ORDER — PAROXETINE HCL 10 MG PO TABS: 10.0000 mg | ORAL_TABLET | Freq: Every day | ORAL | 1 refills | Status: DC

## 2020-08-17 NOTE — Progress Notes (Signed)
Subjective:  Patient ID: Veronica Garza, female    DOB: December 29, 1940  Age: 79 y.o. MRN: [id number]  CC: Hypertension  This visit occurred during the SARS-CoV-2 public health emergency.  Safety protocols were in place, including screening questions prior to the visit, additional usage of staff PPE, and extensive cleaning of exam room while observing appropriate contact time as indicated for disinfecting solutions.    HPI Veronica Garza presents for f/up - She continues to complain of frequent urination and feels like her current treatment for OAB, tolterodine, is causing dizziness and ataxia.  She feels like her blood pressure is well controlled though she does not get much activity.  She denies headache, blurred vision, chest pain, shortness of breath, or dyspnea.  Outpatient Medications Prior to Visit  Medication Sig Dispense Refill  . Calcium Carbonate-Vitamin D (CALTRATE 600+D) 600-400 MG-UNIT per tablet Take 1 tablet by mouth 2 (two) times daily.     . diclofenac Sodium (VOLTAREN) 1 % GEL Apply topically as needed (apply to arthritic areas).    Marland Kitchen ibuprofen (ADVIL) 200 MG tablet Take 600 mg by mouth at bedtime.    . irbesartan (AVAPRO) 300 MG tablet TAKE ONE TABLET BY MOUTH DAILY 90 tablet 1  . Multiple Vitamins-Minerals (MULTIVITAMIN WITH MINERALS) tablet Take 1 tablet by mouth daily.    . Multiple Vitamins-Minerals (PRESERVISION AREDS 2 PO) Take 1 capsule by mouth 2 (two) times daily.    Marland Kitchen omeprazole (PRILOSEC) 40 MG capsule TAKE ONE CAPSULE BY MOUTH DAILY 30 capsule 11  . Propylene Glycol (SYSTANE BALANCE) 0.6 % SOLN Place 1 drop into both eyes 3 (three) times daily as needed (dry eyes).    . rosuvastatin (CRESTOR) 20 MG tablet Take 1 tablet (20 mg total) by mouth daily. 90 tablet 1  . TURMERIC PO Take 1,000 mg by mouth daily. Take one by mouth daily     . docusate sodium (COLACE) 100 MG capsule Take 1 capsule (100 mg total) by mouth 2 (two) times daily. 28 capsule 0  .  HYDROcodone-acetaminophen (NORCO) 7.5-325 MG tablet Take 1-2 tablets by mouth every 4 (four) hours as needed for moderate pain. 60 tablet 0  . Lidocaine HCl-Benzyl Alcohol (SALONPAS LIDOCAINE PLUS) 4-10 % CREA Apply topically. Apply to knee prn    . methocarbamol (ROBAXIN) 500 MG tablet Take 1 tablet (500 mg total) by mouth every 6 (six) hours as needed for muscle spasms. 40 tablet 0  . PARoxetine (PAXIL) 10 MG tablet TAKE ONE TABLET BY MOUTH DAILY 90 tablet 1  . polyethylene glycol (MIRALAX / GLYCOLAX) 17 g packet Take 17 g by mouth 2 (two) times daily. 28 packet 0  . tolterodine (DETROL LA) 2 MG 24 hr capsule TAKE ONE CAPSULE BY MOUTH DAILY 90 capsule 1  . zolpidem (AMBIEN) 5 MG tablet Take 1 tablet (5 mg total) by mouth at bedtime as needed for sleep. 10 tablet 0  . ferrous sulfate (FERROUSUL) 325 (65 FE) MG tablet Take 1 tablet (325 mg total) by mouth 3 (three) times daily with meals for 14 days. 42 tablet 0   No facility-administered medications prior to visit.    ROS Review of Systems  Constitutional: Positive for unexpected weight change (wt gain). Negative for appetite change, chills, diaphoresis and fatigue.  HENT: Negative.   Eyes: Negative.   Respiratory: Negative for cough, chest tightness, shortness of breath and wheezing.   Cardiovascular: Negative for chest pain, palpitations and leg swelling.  Gastrointestinal: Negative for abdominal pain,  constipation, diarrhea, nausea and vomiting.  Endocrine: Positive for polyuria.  Genitourinary: Positive for dysuria.  Musculoskeletal: Positive for arthralgias.  Skin: Negative.   Neurological: Negative.  Negative for dizziness and weakness.  Hematological: Negative for adenopathy. Does not bruise/bleed easily.  Psychiatric/Behavioral: Negative.     Objective:  BP (!) 158/74 (BP Location: Left Arm, Patient Position: Sitting, Cuff Size: Large)   Pulse 69   Temp 98 F (36.7 C) (Oral)   Ht 4\' 11"  (1.499 m)   Wt 210 lb (95.3 kg)    SpO2 96%   BMI 42.41 kg/m   BP Readings from Last 3 Encounters:  08/17/20 (!) 158/74  08/18/19 (!) 115/53  08/12/19 (!) 153/69    Wt Readings from Last 3 Encounters:  08/17/20 210 lb (95.3 kg)  08/17/19 201 lb (91.2 kg)  08/12/19 201 lb (91.2 kg)    Physical Exam Vitals reviewed.  Constitutional:      Appearance: She is obese.  HENT:     Nose: Nose normal.     Mouth/Throat:     Mouth: Mucous membranes are moist.  Eyes:     General: No scleral icterus.    Conjunctiva/sclera: Conjunctivae normal.  Cardiovascular:     Rate and Rhythm: Normal rate and regular rhythm.     Heart sounds: No murmur heard.   Pulmonary:     Effort: Pulmonary effort is normal.     Breath sounds: No stridor. No wheezing, rhonchi or rales.  Abdominal:     General: Abdomen is flat.     Palpations: There is no mass.     Tenderness: There is no abdominal tenderness. There is no guarding.  Musculoskeletal:        General: Normal range of motion.     Cervical back: Neck supple.     Right lower leg: No edema.     Left lower leg: No edema.  Lymphadenopathy:     Cervical: No cervical adenopathy.  Skin:    General: Skin is warm and dry.  Neurological:     General: No focal deficit present.     Mental Status: She is alert.     Lab Results  Component Value Date   WBC 7.2 08/17/2020   HGB 12.9 08/17/2020   HCT 39.3 08/17/2020   PLT 192 08/17/2020   GLUCOSE 94 08/17/2020   CHOL 159 08/17/2020   TRIG 161 (H) 08/17/2020   HDL 69 08/17/2020   LDLDIRECT 119.5 09/04/2012   LDLCALC 66 08/17/2020   ALT 20 08/17/2020   AST 24 08/17/2020   NA 141 08/17/2020   K 4.0 08/17/2020   CL 102 08/17/2020   CREATININE 0.91 08/17/2020   BUN 20 08/17/2020   CO2 27 08/17/2020   TSH 1.79 08/17/2020   INR 0.93 09/30/2011   HGBA1C 5.6 08/17/2020    DEXAScan  Result Date: 11/05/2019 Date of study: 11/05/2019 Exam: DUAL X-RAY ABSORPTIOMETRY (DXA) FOR BONE MINERAL DENSITY (BMD) 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 female without previous fractures.  On calcium and vitamin D Results:  Lumbar spine L1-L4 Femoral neck (FN) 33% distal radius T-score  +4.4 RFN: -0.7 LFN: -0.2 n/a Assessment: the BMD is normal according to the [facility] classification for osteoporosis (see below). Fracture risk: low FRAX score: not calculated due to normal BMD Comments: the technical quality  of the study is good, however, the spine is scoliotic and arthritic. Calcium accumulation in arthritic sites can confound the results of the bone density scan. Recommend optimizing calcium (1200 mg/day) and vitamin D (800 IU/day) intake. No pharmacological treatment is indicated. Followup: Repeat BMD is appropriate after 2 years. WHO criteria for diagnosis of osteoporosis in postmenopausal women and in men 68 y/o or older: - normal: T-score -1.0 to + 1.0 - osteopenia/low bone density: T-score between -2.5 and -1.0 - osteoporosis: T-score below -2.5 - severe osteoporosis: T-score below -2.5 with history of fragility fracture Note: although not part of the WHO classification, the presence of a fragility fracture, regardless of the T-score, should be considered diagnostic of osteoporosis, provided other causes for the fracture have been excluded. Philemon Kingdom, MD [facility]   Assessment & Plan:   Veronica Garza was seen today for hypertension.  Diagnoses and all orders for this visit:  OAB (overactive bladder)- She is hypertensive and has side effects from the tolterodine so I recommended that she treat the OAB with vibegron. -     Vibegron (GEMTESA) 75 MG TABS; Take 1 tablet by mouth daily. -     Urinalysis, Routine w reflex microscopic; Future -     Urinalysis, Routine w reflex microscopic  Essential hypertension, benign- Her  blood pressure is not quite adequately well controlled.  I recommended that she improve her lifestyle modifications and to continue the ARB. -     CBC with Differential/Platelet; Future -     BASIC METABOLIC PANEL WITH GFR; Future -     TSH; Future -     TSH -     BASIC METABOLIC PANEL WITH GFR -     CBC with Differential/Platelet  Hyperlipidemia LDL goal <130- She has achieved her LDL goal is doing well on the statin. -     Lipid panel; Future -     TSH; Future -     Hepatic function panel; Future -     Hepatic function panel -     TSH -     Lipid panel  Other abnormal glucose- Her blood sugar is normal now. -     BASIC METABOLIC PANEL WITH GFR; Future -     Hemoglobin A1c; Future -     Hemoglobin A1c -     BASIC METABOLIC PANEL WITH GFR  Hot flashes, menopausal- She has improved with paroxetine.  Will continue with the current dose. -     PARoxetine (PAXIL) 10 MG tablet; Take 1 tablet (10 mg total) by mouth daily.  Need for influenza vaccination -     Flu Vaccine QUAD High Dose(Fluad)   I have discontinued Jenefer B. Dehne's ferrous sulfate, docusate sodium, polyethylene glycol, methocarbamol, HYDROcodone-acetaminophen, Salonpas Lidocaine Plus, tolterodine, and zolpidem. I have also changed her PARoxetine. Additionally, I am having her start on Gemtesa. Lastly, I am having her maintain her Calcium Carbonate-Vitamin D, TURMERIC PO, multivitamin with minerals, Multiple Vitamins-Minerals (PRESERVISION AREDS 2 PO), Systane Balance, ibuprofen, diclofenac Sodium, rosuvastatin, omeprazole, and irbesartan.  Meds ordered this encounter  Medications  . Vibegron (GEMTESA) 75 MG TABS    Sig: Take 1 tablet by mouth daily.    Dispense:  90 tablet    Refill:  1  . PARoxetine (PAXIL) 10 MG tablet    Sig: Take 1 tablet (10 mg total) by mouth daily.    Dispense:  90 tablet    Refill:  1     Follow-up: Return in about 6  months (around 02/14/2021).  Scarlette Calico, MD

## 2020-08-17 NOTE — Patient Instructions (Signed)

## 2020-08-18 LAB — BASIC METABOLIC PANEL WITH GFR
BUN: 20 mg/dL (ref 7–25)
CO2: 27 mmol/L (ref 20–32)
Calcium: 9.7 mg/dL (ref 8.6–10.4)
Chloride: 102 mmol/L (ref 98–110)
Creat: 0.91 mg/dL (ref 0.60–0.93)
GFR, Est African American: 71 mL/min/{1.73_m2} (ref >=60)
GFR, Est Non African American: 61 mL/min/{1.73_m2} (ref >=60)
Glucose, Bld: 94 mg/dL (ref 65–99)
Potassium: 4 mmol/L (ref 3.5–5.3)
Sodium: 141 mmol/L (ref 135–146)

## 2020-08-18 LAB — HEPATIC FUNCTION PANEL
AG Ratio: 1.7 (calc) (ref 1.0–2.5)
ALT: 20 U/L (ref 6–29)
AST: 24 U/L (ref 10–35)
Albumin: 4.5 g/dL (ref 3.6–5.1)
Alkaline phosphatase (APISO): 113 U/L (ref 37–153)
Bilirubin, Direct: 0.2 mg/dL (ref 0.0–0.2)
Globulin: 2.7 g/dL (calc) (ref 1.9–3.7)
Indirect Bilirubin: 0.5 mg/dL (calc) (ref 0.2–1.2)
Total Bilirubin: 0.7 mg/dL (ref 0.2–1.2)
Total Protein: 7.2 g/dL (ref 6.1–8.1)

## 2020-08-18 LAB — URINALYSIS, ROUTINE W REFLEX MICROSCOPIC
Bilirubin Urine: NEGATIVE
Glucose, UA: NEGATIVE
Hgb urine dipstick: NEGATIVE
Ketones, ur: NEGATIVE
Leukocytes,Ua: NEGATIVE
Nitrite: NEGATIVE
Protein, ur: NEGATIVE
Specific Gravity, Urine: 1.008 (ref 1.001–1.03)
pH: 6 (ref 5.0–8.0)

## 2020-08-18 LAB — CBC WITH DIFFERENTIAL/PLATELET
Absolute Monocytes: 540 cells/uL (ref 200–950)
Basophils Absolute: 29 cells/uL (ref 0–200)
Basophils Relative: 0.4 %
Eosinophils Absolute: 158 cells/uL (ref 15–500)
Eosinophils Relative: 2.2 %
HCT: 39.3 % (ref 35.0–45.0)
Hemoglobin: 12.9 g/dL (ref 11.7–15.5)
Lymphs Abs: 2354 cells/uL (ref 850–3900)
MCH: 31.3 pg (ref 27.0–33.0)
MCHC: 32.8 g/dL (ref 32.0–36.0)
MCV: 95.4 fL (ref 80.0–100.0)
MPV: 11.6 fL (ref 7.5–12.5)
Monocytes Relative: 7.5 %
Neutro Abs: 4118 cells/uL (ref 1500–7800)
Neutrophils Relative %: 57.2 %
Platelets: 192 10*3/uL (ref 140–400)
RBC: 4.12 10*6/uL (ref 3.80–5.10)
RDW: 13 % (ref 11.0–15.0)
Total Lymphocyte: 32.7 %
WBC: 7.2 10*3/uL (ref 3.8–10.8)

## 2020-08-18 LAB — LIPID PANEL
Cholesterol: 159 mg/dL (ref <200)
HDL: 69 mg/dL (ref >=50)
LDL Cholesterol (Calc): 66 mg/dL (calc)
Non-HDL Cholesterol (Calc): 90 mg/dL (calc) (ref <130)
Total CHOL/HDL Ratio: 2.3 (calc) (ref <5.0)
Triglycerides: 161 mg/dL — ABNORMAL HIGH (ref <150)

## 2020-08-18 LAB — HEMOGLOBIN A1C
Hgb A1c MFr Bld: 5.6 % of total Hgb (ref <5.7)
Mean Plasma Glucose: 114 (calc)
eAG (mmol/L): 6.3 (calc)

## 2020-08-18 LAB — TSH: TSH: 1.79 mIU/L (ref 0.40–4.50)

## 2020-08-20 ENCOUNTER — Other Ambulatory Visit: Payer: Self-pay | Admitting: Internal Medicine

## 2020-08-20 DIAGNOSIS — E785 Hyperlipidemia, unspecified: Secondary | ICD-10-CM

## 2020-08-20 DIAGNOSIS — N3281 Overactive bladder: Secondary | ICD-10-CM

## 2020-08-20 MED ORDER — GEMTESA 75 MG PO TABS: 1.0000 | ORAL_TABLET | Freq: Every day | ORAL | 1 refills | Status: DC

## 2020-08-20 MED ORDER — ROSUVASTATIN CALCIUM 20 MG PO TABS: 20.0000 mg | ORAL_TABLET | Freq: Every day | ORAL | 1 refills | Status: DC

## 2020-08-21 ENCOUNTER — Other Ambulatory Visit: Payer: Self-pay | Admitting: Internal Medicine

## 2020-08-21 DIAGNOSIS — N951 Menopausal and female climacteric states: Secondary | ICD-10-CM

## 2020-09-07 DIAGNOSIS — Z471 Aftercare following joint replacement surgery: Secondary | ICD-10-CM | POA: Diagnosis not present

## 2020-09-07 DIAGNOSIS — Z96651 Presence of right artificial knee joint: Secondary | ICD-10-CM | POA: Diagnosis not present

## 2020-09-27 ENCOUNTER — Telehealth: Payer: Self-pay | Admitting: Internal Medicine

## 2020-09-27 ENCOUNTER — Other Ambulatory Visit: Payer: Self-pay | Admitting: Internal Medicine

## 2020-09-27 DIAGNOSIS — N3281 Overactive bladder: Secondary | ICD-10-CM

## 2020-09-27 MED ORDER — MIRABEGRON ER 50 MG PO TB24: 50.0000 mg | ORAL_TABLET | Freq: Every day | ORAL | 1 refills | Status: DC

## 2020-09-27 NOTE — Telephone Encounter (Signed)
    [facility] calling to report patient is almost of out Vibegron (GEMTESA) 75 MG TABS The medication will require prior auth The CoverMy Med Key is [facility]

## 2020-09-27 NOTE — Telephone Encounter (Addendum)
PA has been initiated.   Per CoverMyMeds PA was approved.

## 2020-09-27 NOTE — Telephone Encounter (Signed)
    Pharmacy states patient not able to pay copay (over $200 a month, $740 for 90 day supply)

## 2020-09-28 ENCOUNTER — Other Ambulatory Visit: Payer: Self-pay | Admitting: Internal Medicine

## 2020-09-28 DIAGNOSIS — N3281 Overactive bladder: Secondary | ICD-10-CM

## 2020-09-28 MED ORDER — SOLIFENACIN SUCCINATE 10 MG PO TABS: 10.0000 mg | ORAL_TABLET | Freq: Every day | ORAL | 1 refills | Status: DC

## 2020-10-07 DIAGNOSIS — Z23 Encounter for immunization: Secondary | ICD-10-CM | POA: Diagnosis not present

## 2020-10-12 ENCOUNTER — Other Ambulatory Visit: Payer: Self-pay

## 2020-10-12 ENCOUNTER — Telehealth (INDEPENDENT_AMBULATORY_CARE_PROVIDER_SITE_OTHER): Payer: Medicare Other | Admitting: Family Medicine

## 2020-10-12 DIAGNOSIS — R0981 Nasal congestion: Secondary | ICD-10-CM

## 2020-10-12 DIAGNOSIS — R059 Cough, unspecified: Secondary | ICD-10-CM | POA: Diagnosis not present

## 2020-10-12 MED ORDER — DOXYCYCLINE HYCLATE 100 MG PO TABS: 100.0000 mg | ORAL_TABLET | Freq: Two times a day (BID) | ORAL | 0 refills | Status: DC

## 2020-10-12 MED ORDER — BENZONATATE 100 MG PO CAPS: 100.0000 mg | ORAL_CAPSULE | Freq: Three times a day (TID) | ORAL | 0 refills | Status: DC | PRN

## 2020-10-12 NOTE — Progress Notes (Signed)
Virtual Visit via Telephone Note  I connected with Veronica Garza on 10/12/20 at 12:40 PM EST by telephone and verified that I am speaking with the correct person using two identifiers.   I discussed the limitations, risks, security and privacy concerns of performing an evaluation and management service by telephone and the availability of in person appointments. I also discussed with the patient that there may be a patient responsible charge related to this service. The patient expressed understanding and agreed to proceed.  Location patient: home, [place] Location provider: work or home office Participants present for the call: patient, provider Patient did not have a visit with me in the prior 7 days to address this/these issue(s).   History of Present Illness:  Acute telemedicine visit for cough and congestion: -Onset:3 days ago -Symptoms include: sore throat, nasal congestion, laryngitis, cough - productive, feels like had "rattle in chest" yesterday -Denies: fevers, CP, SOB, NVD, inability to eat/drink/get out of bed, no known sick contacts -Has tried:musinex DM, OTC cough syrup -Pertinent past medical history: Denies lung disease, but mention of ILD on problem list -Pertinent medication allergies: Sulfa, naproxen, iohexol, tolterodine, bacitracin caused rash -COVID-19 vaccine status: vaccinated + booster; also had flu shot   Observations/Objective: Patient sounds cheerful and well on the phone. I do not appreciate any SOB. Speech and thought processing are grossly intact. Patient reported vitals:  Assessment and Plan:  Cough  Nasal congestion  -we discussed possible serious and likely etiologies, options for evaluation and workup, limitations of telemedicine visit vs in person visit, treatment, treatment risks and precautions. Pt prefers to treat via telemedicine empirically rather than in person at this moment.  Query viral illness, particularly given the laryngitis, however  given her potential underlying lung disease and the reported "rattle in the chest "yesterday along with thick mucus production, opted for treatment with doxycycline 100 mg twice daily for 7 days along with Tessalon for cough.  Also, advised low threshold to seek in person care if worsening or not improving promptly with treatment.  Other symptomatic care, testing options, etc. summarized in patient instructions. Work/School slipped offered: Not applicable Scheduled follow up with PCP offered: Agrees to call if needed. Advised to seek prompt in person care if worsening, new symptoms arise, or if is not improving with treatment. Advised of options for inperson care in case PCP office not available. Did let the patient know that I only do telemedicine shifts for [place] on Tuesdays and Thursdays and advised a follow up visit with PCP or at an [facility] if has further questions or concerns.   Follow Up Instructions:  I did not refer this patient for an OV with me in the next 24 hours for this/these issue(s).  I discussed the assessment and treatment plan with the patient. The patient was provided an opportunity to ask questions and all were answered. The patient agreed with the plan and demonstrated an understanding of the instructions.   I spent 13 minutes on this encounter.   Lucretia Kern, DO

## 2020-10-12 NOTE — Patient Instructions (Addendum)
   It was nice to meet you today, and I really hope you are feeling better soon. I help [facility] out with telemedicine visits on Tuesdays and Thursdays and am available for visits on those days. If you have any concerns or questions following this visit please schedule a follow up visit with your Primary Care doctor or seek care at a local urgent care clinic to avoid delays in care.    Seek in person care promptly if your symptoms worsen, new concerns arise or you are not improving with treatment. Call 911 and/or seek emergency care if you symptoms are severe or life threatening.   -stay home while sick, and if you have [place] please stay home for a full 10 days since the onset of symptoms PLUS one day of no fever and feeling better  -[place] COVID19 testing information: [email or web address] OR [phone] You can also get COVID testing at most pharmacies. Flu and COVID testing are usually available at Urgent care centers via appointments.  -I sent the medication(s) we discussed to your pharmacy: Meds ordered this encounter  Medications  . doxycycline (VIBRA-TABS) 100 MG tablet    Sig: Take 1 tablet (100 mg total) by mouth 2 (two) times daily.    Dispense:  14 tablet    Refill:  0  . benzonatate (TESSALON PERLES) 100 MG capsule    Sig: Take 1 capsule (100 mg total) by mouth 3 (three) times daily as needed.    Dispense:  20 capsule    Refill:  0     -can use nasal saline a few times per day if nasal congestion, a humidifier at night can also help  -stay hydrated, drink plenty of fluids and eat small healthy meals - avoid dairy   -follow up with your doctor in 2-3 days unless improving and feeling better

## 2020-10-13 ENCOUNTER — Telehealth: Payer: Self-pay | Admitting: Pharmacist

## 2020-10-13 NOTE — Progress Notes (Signed)
Chronic Care Management Pharmacy Assistant   Name: Veronica Garza  MRN: [id number] DOB: Dec 14, 1940  Reason for Encounter: Hypertension Adherence Call  Patient Questions:  1.  Have you seen any other providers since your last visit? Yes, the patient last saw Dr. Scarlette Calico on 08/17/20  2.  Any changes in your medicines or health? Yes, Dr. Ronnald Ramp added Vibegron 75 mg    PCP : Janith Lima, MD  Allergies:   Allergies  Allergen Reactions  . Tolterodine Other (See Comments)    dizziness  . Iohexol Hives     Code: HIVES, Desc: ? Contrast reaction from CT prior to 2000.   . Naproxen Itching and Rash  . Neomycin-Bacitracin Zn-Polymyx Itching and Rash    REACTION: redness  . Sulfonamide Derivatives Itching and Rash    REACTION: rash/tingling    Medications: Outpatient Encounter Medications as of 10/13/2020  Medication Sig  . benzonatate (TESSALON PERLES) 100 MG capsule Take 1 capsule (100 mg total) by mouth 3 (three) times daily as needed.  . Calcium Carbonate-Vitamin D (CALTRATE 600+D) 600-400 MG-UNIT per tablet Take 1 tablet by mouth 2 (two) times daily.   . diclofenac Sodium (VOLTAREN) 1 % GEL Apply topically as needed (apply to arthritic areas).  Marland Kitchen doxycycline (VIBRA-TABS) 100 MG tablet Take 1 tablet (100 mg total) by mouth 2 (two) times daily.  Marland Kitchen ibuprofen (ADVIL) 200 MG tablet Take 600 mg by mouth at bedtime.  . irbesartan (AVAPRO) 300 MG tablet TAKE ONE TABLET BY MOUTH DAILY  . Multiple Vitamins-Minerals (MULTIVITAMIN WITH MINERALS) tablet Take 1 tablet by mouth daily.  . Multiple Vitamins-Minerals (PRESERVISION AREDS 2 PO) Take 1 capsule by mouth 2 (two) times daily.  Marland Kitchen omeprazole (PRILOSEC) 40 MG capsule TAKE ONE CAPSULE BY MOUTH DAILY  . PARoxetine (PAXIL) 10 MG tablet TAKE ONE TABLET BY MOUTH DAILY  . Propylene Glycol (SYSTANE BALANCE) 0.6 % SOLN Place 1 drop into both eyes 3 (three) times daily as needed (dry eyes).  . rosuvastatin (CRESTOR) 20 MG tablet Take 1  tablet (20 mg total) by mouth daily.  . solifenacin (VESICARE) 10 MG tablet Take 1 tablet (10 mg total) by mouth daily.  . TURMERIC PO Take 1,000 mg by mouth daily. Take one by mouth daily    No facility-administered encounter medications on file as of 10/13/2020.    Current Diagnosis: Patient Active Problem List   Diagnosis Date Noted  . Jet lag syndrome 05/16/2020  . Status post total right knee replacement 08/17/2019  . OAB (overactive bladder) 06/22/2019  . ILD (interstitial lung disease) ([place]) 12/15/2018  . Enlarged heart 06/16/2018  . Morbid obesity ([place]) 10/17/2017  . 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 Hot flashes, menopausal 12/31/2016  . DDD (degenerative disc disease), lumbar 11/06/2015  . Right-sided low back pain without sciatica 06/21/2015  . Diastolic dysfunction without heart failure 11/13/2014  . Other abnormal glucose 05/27/2013  . Osteopenia of the elderly 05/27/2013  . Hyperlipidemia LDL goal <130 09/04/2012  . Esophageal stricture 09/12/2011  . Visit for screening mammogram 08/08/2011  . Routine general medical examination at a health care facility 08/08/2011  . Essential hypertension, benign 01/14/2011  . URINARY INCONTINENCE 12/14/2008  . OA (osteoarthritis) 07/21/2008  . Obstructive sleep apnea 10/14/2007  . GERD 10/14/2007    Goals Addressed   None     Follow-Up:  Pharmacist Review   Reviewed chart prior to disease state call. Spoke with patient regarding BP  Recent Office Vitals: BP Readings  from Last 3 Encounters:  08/17/20 (!) 158/74  08/18/19 (!) 115/53  08/12/19 (!) 153/69   Pulse Readings from Last 3 Encounters:  08/17/20 69  08/18/19 (!) 58  08/12/19 66    Wt Readings from Last 3 Encounters:  08/17/20 210 lb (95.3 kg)  08/17/19 201 lb (91.2 kg)  08/12/19 201 lb (91.2 kg)     Kidney Function Lab Results  Component Value Date/Time   CREATININE 0.91 08/17/2020 03:45 PM   CREATININE 0.92 08/18/2019 02:32 AM    CREATININE 1.01 (H) 08/17/2019 07:08 AM   GFR 88.49 05/10/2019 03:25 PM   GFRNONAA 61 08/17/2020 03:45 PM   GFRAA 71 08/17/2020 03:45 PM    BMP Latest Ref Rng & Units 08/17/2020 08/18/2019 08/17/2019  Glucose 65 - 99 mg/dL 94 167(H) 88  BUN 7 - 25 mg/dL 20 21 21   Creatinine 0.60 - 0.93 mg/dL 0.91 0.92 1.01(H)  BUN/Creat Ratio 6 - 22 (calc) NOT APPLICABLE - -  Sodium 536 - 146 mmol/L 141 135 138  Potassium 3.5 - 5.3 mmol/L 4.0 4.5 4.2  Chloride 98 - 110 mmol/L 102 104 105  CO2 20 - 32 mmol/L 27 23 24   Calcium 8.6 - 10.4 mg/dL 9.7 8.8(L) 9.8    . Current antihypertensive regimen: The patient is taking Irbesartan 300 mg  . How often are you checking your Blood Pressure? The patient states that she does take her blood pressure but not daily  . Current home BP readings: The last reading she can remember at home was 146/76  . What recent interventions/DTPs have been made by any provider to improve Blood Pressure control since last CPP Visit: None  . Any recent hospitalizations or ED visits since last visit with CPP?  None  . What diet changes have been made to improve Blood Pressure Control? None  . What exercise is being done to improve your Blood Pressure Control? None   Adherence Review: Is the patient currently on ACE/ARB medication? Irbesartan Does the patient have >5 day gap between last estimated fill dates? No   Wendy Poet, Clinical Pharmacist Assistant [facility]

## 2020-10-31 NOTE — Telephone Encounter (Signed)
Reviewed chart and insurance data for medication adherence. Patient does not have any gaps in adherence and is not in danger of failing any Medicare adherence measures. No further action required. ° °

## 2020-11-21 ENCOUNTER — Other Ambulatory Visit: Payer: Self-pay | Admitting: Internal Medicine

## 2020-11-21 DIAGNOSIS — E785 Hyperlipidemia, unspecified: Secondary | ICD-10-CM

## 2020-12-07 DIAGNOSIS — K219 Gastro-esophageal reflux disease without esophagitis: Secondary | ICD-10-CM | POA: Diagnosis not present

## 2020-12-07 DIAGNOSIS — R059 Cough, unspecified: Secondary | ICD-10-CM | POA: Diagnosis not present

## 2020-12-07 DIAGNOSIS — R1313 Dysphagia, pharyngeal phase: Secondary | ICD-10-CM | POA: Diagnosis not present

## 2020-12-13 DIAGNOSIS — M7918 Myalgia, other site: Secondary | ICD-10-CM | POA: Diagnosis not present

## 2020-12-13 DIAGNOSIS — M48062 Spinal stenosis, lumbar region with neurogenic claudication: Secondary | ICD-10-CM | POA: Diagnosis not present

## 2020-12-13 DIAGNOSIS — M5136 Other intervertebral disc degeneration, lumbar region: Secondary | ICD-10-CM | POA: Diagnosis not present

## 2020-12-18 ENCOUNTER — Telehealth: Payer: Self-pay | Admitting: Pharmacist

## 2020-12-18 NOTE — Progress Notes (Signed)
Chronic Care Management Pharmacy Assistant   Name: Veronica Garza  MRN: [id number] DOB: August 19, 1941  Reason for Encounter: General Adherence Call   PCP : Janith Lima, MD  Allergies:   Allergies  Allergen Reactions   Tolterodine Other (See Comments)    dizziness   Iohexol Hives     Code: HIVES, Desc: ? Contrast reaction from CT prior to 2000.    Naproxen Itching and Rash   Neomycin-Bacitracin Zn-Polymyx Itching and Rash    REACTION: redness   Sulfonamide Derivatives Itching and Rash    REACTION: rash/tingling    Medications: Outpatient Encounter Medications as of 12/18/2020  Medication Sig   benzonatate (TESSALON PERLES) 100 MG capsule Take 1 capsule (100 mg total) by mouth 3 (three) times daily as needed.   Calcium Carbonate-Vitamin D (CALTRATE 600+D) 600-400 MG-UNIT per tablet Take 1 tablet by mouth 2 (two) times daily.    diclofenac Sodium (VOLTAREN) 1 % GEL Apply topically as needed (apply to arthritic areas).   doxycycline (VIBRA-TABS) 100 MG tablet Take 1 tablet (100 mg total) by mouth 2 (two) times daily.   ibuprofen (ADVIL) 200 MG tablet Take 600 mg by mouth at bedtime.   irbesartan (AVAPRO) 300 MG tablet TAKE ONE TABLET BY MOUTH DAILY   Multiple Vitamins-Minerals (MULTIVITAMIN WITH MINERALS) tablet Take 1 tablet by mouth daily.   Multiple Vitamins-Minerals (PRESERVISION AREDS 2 PO) Take 1 capsule by mouth 2 (two) times daily.   omeprazole (PRILOSEC) 40 MG capsule TAKE ONE CAPSULE BY MOUTH DAILY   PARoxetine (PAXIL) 10 MG tablet TAKE ONE TABLET BY MOUTH DAILY   Propylene Glycol (SYSTANE BALANCE) 0.6 % SOLN Place 1 drop into both eyes 3 (three) times daily as needed (dry eyes).   rosuvastatin (CRESTOR) 20 MG tablet Take 1 tablet (20 mg total) by mouth daily.   solifenacin (VESICARE) 10 MG tablet Take 1 tablet (10 mg total) by mouth daily.   TURMERIC PO Take 1,000 mg by mouth daily. Take one by mouth daily    No facility-administered  encounter medications on file as of 12/18/2020.    Current Diagnosis: Patient Active Problem List   Diagnosis Date Noted   Jet lag syndrome 05/16/2020   Status post total right knee replacement 08/17/2019   OAB (overactive bladder) 06/22/2019   ILD (interstitial lung disease) ([place]) 12/15/2018   Enlarged heart 06/16/2018   Morbid obesity ([place]) 10/17/2017   Functional belching disorder 12/31/2016   Hot flashes, menopausal 12/31/2016   DDD (degenerative disc disease), lumbar 11/06/2015   Right-sided low back pain without sciatica 51/70/0174   Diastolic dysfunction without heart failure 11/13/2014   Other abnormal glucose 05/27/2013   Osteopenia of the elderly 05/27/2013   Hyperlipidemia LDL goal <130 09/04/2012   Esophageal stricture 09/12/2011   Visit for screening mammogram 08/08/2011   Routine general medical examination at a health care facility 08/08/2011   Essential hypertension, benign 01/14/2011   URINARY INCONTINENCE 12/14/2008   OA (osteoarthritis) 07/21/2008   Obstructive sleep apnea 10/14/2007   GERD 10/14/2007    Goals Addressed   None     Follow-Up:  Pharmacist Review    A general adherence wellness call was made to Veronica Garza to ask how she has been doing since she last saw the clinical pharmacist Mendel Ryder. The patient stated that she has been having some trouble with her back that started about six weeks ago and she has been taking prednisone and tylenol. She did say that she would be going back  to see Dr. Nelva Bush in about 3 weeks to discuss if she will get an injection for for back. The patient also stated that she does get dizzy a lot, she is not sure why, but walks with a cane to keep from falling. The patient states that her blood pressure is normal the last time she had it check. She is not really concerned with any thing else with her health except her back issues. I let the patient know that I will pass along the information to the clinical  pharmacist Mendel Ryder.   Wendy Poet, [place] [phone]

## 2021-01-11 DIAGNOSIS — M5416 Radiculopathy, lumbar region: Secondary | ICD-10-CM | POA: Diagnosis not present

## 2021-01-11 DIAGNOSIS — M545 Low back pain, unspecified: Secondary | ICD-10-CM | POA: Diagnosis not present

## 2021-01-17 DIAGNOSIS — M5416 Radiculopathy, lumbar region: Secondary | ICD-10-CM | POA: Diagnosis not present

## 2021-01-18 DIAGNOSIS — M5416 Radiculopathy, lumbar region: Secondary | ICD-10-CM | POA: Diagnosis not present

## 2021-01-25 ENCOUNTER — Other Ambulatory Visit: Payer: Self-pay

## 2021-01-25 ENCOUNTER — Ambulatory Visit (INDEPENDENT_AMBULATORY_CARE_PROVIDER_SITE_OTHER): Payer: Medicare Other | Admitting: Pharmacist

## 2021-01-25 DIAGNOSIS — E785 Hyperlipidemia, unspecified: Secondary | ICD-10-CM

## 2021-01-25 DIAGNOSIS — I10 Essential (primary) hypertension: Secondary | ICD-10-CM | POA: Diagnosis not present

## 2021-01-25 DIAGNOSIS — N3281 Overactive bladder: Secondary | ICD-10-CM

## 2021-01-25 DIAGNOSIS — M5136 Other intervertebral disc degeneration, lumbar region: Secondary | ICD-10-CM

## 2021-01-25 NOTE — Patient Instructions (Signed)
Visit Information  Phone number for Pharmacist: [phone]  Goals Addressed   None    Patient Care Plan: CCM Pharmacy Care Plan    Problem Identified: Hypertension, Hyperlipidemia, Overactive Bladder and Back pain   Priority: High    Long-Range Goal: Disease management   Start Date: 01/25/2021  Expected End Date: 07/25/2021  This Visit's Progress: On track  Priority: High  Note:   Current Barriers:  . Unable to independently monitor therapeutic efficacy . Unable to maintain control of back pain  Pharmacist Clinical Goal(s):  Marland Kitchen Over the next 90 days, patient will achieve adherence to monitoring guidelines and medication adherence to achieve therapeutic efficacy . maintain control of back pain as evidenced by patient report  through collaboration with PharmD and provider.   Interventions: . 1:1 collaboration with Janith Lima, MD regarding development and update of comprehensive plan of care as evidenced by provider attestation and co-signature . Inter-disciplinary care team collaboration (see longitudinal plan of care) . Comprehensive medication review performed; medication list updated in electronic medical record  Hypertension (BP goal < 130/80) Uncontrolled - per patient home BP is >180/90 because she hurt her back recently and is in constant pain. Of note BP was previously controlled on current regimen Current regimen:  ? Irbesartan 300 mg daily Interventions: ? Discussed BP goals and benefits of medications for prevention of heart attack / stroke ? Advised to contact pain management for further recommendations Patient self care activities: ? Check BP daily, document, and provide at future appointments ? Ensure daily salt intake < 2300 mg/day   Hyperlipidemia (LDL goal < 100) Controlled  Current regimen:  ? Rosuvastatin 20 mg daily Interventions: ? Discussed cholesterol goals and benefits of medications for prevention of heart attack / stroke Patient self care  activities  ? Continue medication as prescribed  Overactive bladder Controlled - per patient report Current regimen:  o Solifenacin 10 mg daily  Previously tried/failed: tolterodine, Gemtesa (cost), Myrbetriq Interventions: o Discussed benefits of medication  Patient self care activities  o Continue current medication  Back pain Uncontrolled - pt reports she had back injection last week and it has not helped Current regimen:  o Tramadol 50 mg q6h prn o Docusate 100 mg daily o Ibuprofen 200 mg PRN - taking BID in between tramadol o Celecoxib 200 mg BID - not taking Interventions: o Discussed ibuprofen and celecoxib are similar and not to be used at the same time o Cautioned overuse of NSAIDs - can increase BP, damage kidneys, and cause GI bleeding o Advised to contact pain management for next steps Patient self care activities  o Continue current medication o Contact Dr. Nelva Bush' office today  Patient Goals/Self-Care Activities . Over the next 90 days, patient will:  - take medications as prescribed focus on medication adherence by pill box Contact Dr Nelva Bush' office ASAP  Follow Up Plan: Telephone follow up appointment with care management team member scheduled for: 3 months      The patient verbalized understanding of instructions, educational materials, and care plan provided today and declined offer to receive copy of patient instructions, educational materials, and care plan.  Telephone follow up appointment with pharmacy team member scheduled for: 3 months  Charlene Brooke, PharmD, [facility] Clinical Pharmacist [facility] [phone]

## 2021-01-25 NOTE — Progress Notes (Signed)
Chronic Care Management Pharmacy Note  01/25/2021 Name:  Veronica Garza MRN:  [id number] DOB:  01/27/1941  Subjective: Veronica Garza is an 80 y.o. year old female who is a primary patient of Janith Lima, MD.  The CCM team was consulted for assistance with disease management and care coordination needs.    Engaged with patient by telephone for follow up visit in response to provider referral for pharmacy case management and/or care coordination services.   Consent to Services:  The patient was given the following information about Chronic Care Management services today, agreed to services, and gave verbal consent: 1. CCM service includes personalized support from designated clinical staff supervised by the primary care provider, including individualized plan of care and coordination with other care providers 2. 24/7 contact phone numbers for assistance for urgent and routine care needs. 3. Service will only be billed when office clinical staff spend 20 minutes or more in a month to coordinate care. 4. Only one practitioner may furnish and bill the service in a calendar month. 5.The patient may stop CCM services at any time (effective at the end of the month) by phone call to the office staff. 6. The patient will be responsible for cost sharing (co-pay) of up to 20% of the service fee (after annual deductible is met). Patient agreed to services and consent obtained.  Patient Care Team: Janith Lima, MD as PCP - General Rigoberto Noel, MD as Consulting Physician (Pulmonary Disease) Mauri Pole, MD as Consulting Physician (Gastroenterology) Charlton Haws, [facility] as Pharmacist (Pharmacist)  Recent office visits: 08/17/20 Dr Ronnald Ramp OV: chronic f/u, c/o frequent urination. Rx'd Gemtesa 75 mg to replace tolterodine. Eventually changed to solifenacin 10 mg.  Recent consult visits: 01/18/21 Dr Nelva Bush (pain mgmt): injection for back pain given.  Hospital visits: None in previous 6  months  Objective:  Lab Results  Component Value Date   CREATININE 0.91 08/17/2020   BUN 20 08/17/2020   GFR 88.49 05/10/2019   GFRNONAA 61 08/17/2020   GFRAA 71 08/17/2020   NA 141 08/17/2020   K 4.0 08/17/2020   CALCIUM 9.7 08/17/2020   CO2 27 08/17/2020    Lab Results  Component Value Date/Time   HGBA1C 5.6 08/17/2020 03:45 PM   HGBA1C 5.8 05/19/2019 02:25 PM   GFR 88.49 05/10/2019 03:25 PM   GFR 83.67 12/15/2018 12:15 PM    Last diabetic Eye exam: No results found for: HMDIABEYEEXA  Last diabetic Foot exam: No results found for: HMDIABFOOTEX   Lab Results  Component Value Date   CHOL 159 08/17/2020   HDL 69 08/17/2020   LDLCALC 66 08/17/2020   LDLDIRECT 119.5 09/04/2012   TRIG 161 (H) 08/17/2020   CHOLHDL 2.3 08/17/2020    Hepatic Function Latest Ref Rng & Units 08/17/2020 05/10/2019 01/01/2018  Total Protein 6.1 - 8.1 g/dL 7.2 6.8 7.5  Albumin 3.5 - 5.2 g/dL - 4.1 4.3  AST 10 - 35 U/L _0 ALT 6 - 29 U/L _1 Alk Phosphatase 39 - 117 U/L - 119(H) 110  Total Bilirubin 0.2 - 1.2 mg/dL 0.7 0.5 0.8  Bilirubin, Direct 0.0 - 0.2 mg/dL 0.2 - -    Lab Results  Component Value Date/Time   TSH 1.79 08/17/2020 03:45 PM   TSH 1.74 05/19/2019 02:25 PM    CBC Latest Ref Rng & Units 08/17/2020 08/18/2019 08/12/2019  WBC 3.8 - 10.8 Thousand/uL 7.2 8.9 7.7  Hemoglobin 11.7 -  15.5 g/dL 12.9 11.1(L) 13.7  Hematocrit 35.0 - 45.0 % 39.3 33.3(L) 41.1  Platelets 140 - 400 Thousand/uL 192 141(L) 170    No results found for: VD25OH  Clinical ASCVD: No  The 10-year ASCVD risk score Mikey Bussing DC Jr., et al., 2013) is: 56.9%   Values used to calculate the score:     Age: 76 years     Sex: Female     Is Non-Hispanic African American: No     Diabetic: No     Tobacco smoker: No     Systolic Blood Pressure: 354 mmHg     Is BP treated: Yes     HDL Cholesterol: 69 mg/dL     Total Cholesterol: 159 mg/dL    Depression screen [facility] 2/9 08/17/2020 06/22/2019 05/19/2019  Decreased  Interest 0 0 0  Down, Depressed, Hopeless 0 0 0  PHQ - 2 Score 0 0 0  Altered sleeping - - -  Tired, decreased energy - - -  Change in appetite - - -  Feeling bad or failure about yourself  - - -  Trouble concentrating - - -  Moving slowly or fidgety/restless - - -  Suicidal thoughts - - -  PHQ-9 Score - - -  Difficult doing work/chores - - -  Some recent data might be hidden    Social History   Tobacco Use  Smoking Status Never Smoker  Smokeless Tobacco Never Used   BP Readings from Last 3 Encounters:  08/17/20 (!) 158/74  08/18/19 (!) 115/53  08/12/19 (!) 153/69   Pulse Readings from Last 3 Encounters:  08/17/20 69  08/18/19 (!) 58  08/12/19 66   Wt Readings from Last 3 Encounters:  08/17/20 210 lb (95.3 kg)  08/17/19 201 lb (91.2 kg)  08/12/19 201 lb (91.2 kg)    Assessment/Interventions: Review of patient past medical history, allergies, medications, health status, including review of consultants reports, laboratory and other test data, was performed as part of comprehensive evaluation and provision of chronic care management services.   SDOH:  (Social Determinants of Health) assessments and interventions performed: Yes   CCM Care Plan  Allergies  Allergen Reactions  . Tolterodine Other (See Comments)    dizziness  . Iohexol Hives     Code: HIVES, Desc: ? Contrast reaction from CT prior to 2000.   . Naproxen Itching and Rash  . Neomycin-Bacitracin Zn-Polymyx Itching and Rash    REACTION: redness  . Sulfonamide Derivatives Itching and Rash    REACTION: rash/tingling    Medications Reviewed Today    Reviewed by Charlton Haws, [facility] (Pharmacist) on 01/25/21 at 1326  Med List Status: <None>  Medication Order Taking? Sig Documenting Provider Last Dose Status Informant  benzonatate (TESSALON PERLES) 100 MG capsule [id number] Yes Take 1 capsule (100 mg total) by mouth 3 (three) times daily as needed. Lucretia Kern, DO Taking Active   Calcium  Carbonate-Vitamin D 600-400 MG-UNIT tablet [id number] Yes Take 1 tablet by mouth 2 (two) times daily. [provider] Taking Active Self  celecoxib (CELEBREX) 200 MG capsule [id number] Yes Take 200 mg by mouth in the morning and at bedtime. [provider] Taking Active   diclofenac Sodium (VOLTAREN) 1 % GEL [id number] Yes Apply topically as needed (apply to arthritic areas). [provider] Taking Active Self  doxycycline (VIBRA-TABS) 100 MG tablet [id number] Yes Take 1 tablet (100 mg total) by mouth 2 (two) times daily. Lucretia Kern, DO Taking Active   ibuprofen (  ADVIL) 200 MG tablet [id number] Yes Take 600 mg by mouth at bedtime. [provider] Taking Active Self  irbesartan (AVAPRO) 300 MG tablet [id number] Yes TAKE ONE TABLET BY MOUTH DAILY Janith Lima, MD Taking Active   Multiple Vitamins-Minerals (MULTIVITAMIN WITH MINERALS) tablet [id number] Yes Take 1 tablet by mouth daily. [provider] Taking Active Self  Multiple Vitamins-Minerals (PRESERVISION AREDS 2 PO) [id number] Yes Take 1 capsule by mouth 2 (two) times daily. [provider] Taking Active Self  omeprazole (PRILOSEC) 40 MG capsule [id number] Yes TAKE ONE CAPSULE BY MOUTH DAILY Nandigam, Venia Minks, MD Taking Active   PARoxetine (PAXIL) 10 MG tablet [id number] Yes TAKE ONE TABLET BY MOUTH DAILY Burns, Claudina Lick, MD Taking Active   Propylene Glycol (SYSTANE BALANCE) 0.6 % SOLN [id number] Yes Place 1 drop into both eyes 3 (three) times daily as needed (dry eyes). [provider] Taking Active Self  rosuvastatin (CRESTOR) 20 MG tablet [id number] Yes Take 1 tablet (20 mg total) by mouth daily. Janith Lima, MD Taking Active   solifenacin (VESICARE) 10 MG tablet [id number] Yes Take 1 tablet (10 mg total) by mouth daily. Janith Lima, MD Taking Active   traMADol Veatrice Bourbon) 50 MG tablet [id number] Yes Take 50 mg by mouth 3 (three) times daily as needed. [provider] Taking  Active   TURMERIC PO [id number] Yes Take 1,000 mg by mouth daily. Take one by mouth daily [provider] Taking Active Self          Patient Active Problem List   Diagnosis Date Noted  . Jet lag syndrome 05/16/2020  . Status post total right knee replacement 08/17/2019  . OAB (overactive bladder) 06/22/2019  . ILD (interstitial lung disease) ([place]) 12/15/2018  . Enlarged heart 06/16/2018  . Morbid obesity ([place]) 10/17/2017  . 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 Hot flashes, menopausal 12/31/2016  . DDD (degenerative disc disease), lumbar 11/06/2015  . Right-sided low back pain without sciatica 06/21/2015  . Diastolic dysfunction without heart failure 11/13/2014  . Other abnormal glucose 05/27/2013  . Osteopenia of the elderly 05/27/2013  . Hyperlipidemia LDL goal <130 09/04/2012  . Esophageal stricture 09/12/2011  . Visit for screening mammogram 08/08/2011  . Routine general medical examination at a health care facility 08/08/2011  . Essential hypertension, benign 01/14/2011  . URINARY INCONTINENCE 12/14/2008  . OA (osteoarthritis) 07/21/2008  . Obstructive sleep apnea 10/14/2007  . GERD 10/14/2007    Immunization History  Administered Date(s) Administered  . Fluad Quad(high Dose 65+) 09/10/2019, 08/17/2020  . Influenza Split 09/04/2012  . Influenza Whole 08/30/2009, 09/18/2010, 08/08/2011  . Influenza, High Dose Seasonal PF 08/30/2014, 09/02/2016, 09/11/2017, 09/22/2018  . Influenza,inj,Quad PF,6+ Mos 09/02/2013, 09/01/2015  . Pneumococcal Conjugate-13 01/01/2018  . Pneumococcal Polysaccharide-23 05/19/2019  . Td 04/06/2010  . Tdap 07/19/2019  . Zoster 09/04/2012  . Zoster Recombinat (Shingrix) 12/11/2019, 03/10/2020    Conditions to be addressed/monitored:  Hypertension, Hyperlipidemia, Overactive Bladder and Back pain  Care Plan : [place]  Updates made by Charlton Haws, [place] since 01/25/2021 12:00 AM    Problem: Hypertension,  Hyperlipidemia, Overactive Bladder and Back pain   Priority: High    Long-Range Goal: Disease management   Start Date: 01/25/2021  Expected End Date: 07/25/2021  This Visit's Progress: On track  Priority: High  Note:   Current Barriers:  . Unable to independently monitor therapeutic efficacy . Unable to maintain control of back pain  Pharmacist Clinical Goal(s):  Marland Kitchen Over  the next 90 days, patient will achieve adherence to monitoring guidelines and medication adherence to achieve therapeutic efficacy . maintain control of back pain as evidenced by patient report  through collaboration with PharmD and provider.   Interventions: . 1:1 collaboration with Janith Lima, MD regarding development and update of comprehensive plan of care as evidenced by provider attestation and co-signature . Inter-disciplinary care team collaboration (see longitudinal plan of care) . Comprehensive medication review performed; medication list updated in electronic medical record  Hypertension (BP goal < 130/80) Uncontrolled - per patient home BP is >180/90 because she hurt her back recently and is in constant pain. Of note BP was previously controlled on current regimen Current regimen:  ? Irbesartan 300 mg daily Interventions: ? Discussed BP goals and benefits of medications for prevention of heart attack / stroke ? Advised to contact pain management for further recommendations Patient self care activities: ? Check BP daily, document, and provide at future appointments ? Ensure daily salt intake < 2300 mg/day   Hyperlipidemia (LDL goal < 100) Controlled  Current regimen:  ? Rosuvastatin 20 mg daily Interventions: ? Discussed cholesterol goals and benefits of medications for prevention of heart attack / stroke Patient self care activities  ? Continue medication as prescribed  Overactive bladder Controlled - per patient report Current regimen:  o Solifenacin 10 mg daily  Previously tried/failed:  tolterodine, Gemtesa (cost), Myrbetriq Interventions: o Discussed benefits of medication  Patient self care activities  o Continue current medication  Back pain Uncontrolled - pt reports she had back injection last week and it has not helped Current regimen:  o Tramadol 50 mg q6h prn o Docusate 100 mg daily o Ibuprofen 200 mg PRN - taking BID in between tramadol o Celecoxib 200 mg BID - not taking Interventions: o Discussed ibuprofen and celecoxib are similar and not to be used at the same time o Cautioned overuse of NSAIDs - can increase BP, damage kidneys, and cause GI bleeding o Advised to contact pain management for next steps Patient self care activities  o Continue current medication o Contact Dr. Nelva Bush' office today  Patient Goals/Self-Care Activities . Over the next 90 days, patient will:  - take medications as prescribed focus on medication adherence by pill box Contact Dr Nelva Bush' office ASAP  Follow Up Plan: Telephone follow up appointment with care management team member scheduled for: 3 months      Medication Assistance: None required.  Patient affirms current coverage meets needs.  Patient's preferred pharmacy is:  [facility] [address] [place], [address] [place] [address] Phone: [phone] Fax: [place], [place] - [address] [place] [address] [place] [address] Phone: [phone] Fax: [phone]  Uses pill box? Yes Pt endorses 100% compliance  We discussed: Current pharmacy is preferred with insurance plan and patient is satisfied with pharmacy services Patient decided to: Continue current medication management strategy  Care Plan and Follow Up Patient Decision:  Patient agrees to Care Plan and Follow-up.  Plan: Telephone follow up appointment with care management team member scheduled for:  3 months  Charlene Brooke, PharmD, [facility] Clinical Pharmacist [facility]  at [facility] [phone]

## 2021-01-29 DIAGNOSIS — N3946 Mixed incontinence: Secondary | ICD-10-CM | POA: Diagnosis not present

## 2021-01-29 DIAGNOSIS — M549 Dorsalgia, unspecified: Secondary | ICD-10-CM | POA: Diagnosis not present

## 2021-02-08 ENCOUNTER — Encounter: Payer: Self-pay | Admitting: Internal Medicine

## 2021-02-08 ENCOUNTER — Ambulatory Visit (INDEPENDENT_AMBULATORY_CARE_PROVIDER_SITE_OTHER): Payer: Medicare Other | Admitting: Internal Medicine

## 2021-02-08 ENCOUNTER — Other Ambulatory Visit: Payer: Self-pay

## 2021-02-08 VITALS — BP 160/92 | HR 89 | Temp 98.4°F | Resp 18 | Ht 59.0 in | Wt 217.6 lb

## 2021-02-08 DIAGNOSIS — M8949 Other hypertrophic osteoarthropathy, multiple sites: Secondary | ICD-10-CM | POA: Diagnosis not present

## 2021-02-08 DIAGNOSIS — M545 Low back pain, unspecified: Secondary | ICD-10-CM

## 2021-02-08 DIAGNOSIS — G8929 Other chronic pain: Secondary | ICD-10-CM | POA: Diagnosis not present

## 2021-02-08 DIAGNOSIS — I10 Essential (primary) hypertension: Secondary | ICD-10-CM

## 2021-02-08 DIAGNOSIS — M159 Polyosteoarthritis, unspecified: Secondary | ICD-10-CM

## 2021-02-08 DIAGNOSIS — M5442 Lumbago with sciatica, left side: Secondary | ICD-10-CM | POA: Diagnosis not present

## 2021-02-08 MED ORDER — GABAPENTIN 300 MG PO CAPS: 300.0000 mg | ORAL_CAPSULE | Freq: Two times a day (BID) | ORAL | 0 refills | Status: DC

## 2021-02-08 MED ORDER — AMLODIPINE BESYLATE 10 MG PO TABS: 10.0000 mg | ORAL_TABLET | Freq: Every day | ORAL | 0 refills | Status: DC

## 2021-02-08 NOTE — Patient Instructions (Signed)
We will get you in with the spine specialist.   We have sent in gabapentin to take 1 pill daily for 3 days, then you can increase to 1 pill twice a day.   I would recommend to space the gabapentin from the oxycodone by at least 3 hours.   We have sent in amlodipine to take 1 pill daily for 2-3 days prior to the back injection so that the blood pressure can be okay for the shot.

## 2021-02-08 NOTE — Progress Notes (Signed)
   Subjective:   Patient ID: Veronica Garza, female    DOB: 1941-01-16, 80 y.o.   MRN: [id number]  HPI The patient is a 80 YO female coming in for concerns about BP. Recently met with our pharmacist and they did not make changes. Is taking irbesartan 300 mg daily for BP and denies missing recently. She is also having severe back pain recently since her BP has been elevated and the pain seemed to have come first. She was supposed to get an injection in the back but this has been postponed twice due to elevated BP readings. She wants to get this done but is not hopeful it will help. She would like a second opinion as she is seeing Dr. Ethelene Hal currently and recent MRI lumbar and feels that there is not really a plan in place to help her. Pain severe and 10/10. Goes down the right leg. Overall terrible and not improving.  Review of Systems  Constitutional: Positive for activity change. Negative for appetite change, chills, fatigue, fever and unexpected weight change.  Respiratory: Negative.   Cardiovascular: Negative.   Gastrointestinal: Negative.   Musculoskeletal: Positive for arthralgias, back pain and myalgias. Negative for gait problem and joint swelling.  Skin: Negative.   Neurological: Negative.     Objective:  Physical Exam Constitutional:      Appearance: She is well-developed. She is obese.     Comments: Appears in pain with any movement  HENT:     Head: Normocephalic and atraumatic.  Cardiovascular:     Rate and Rhythm: Normal rate and regular rhythm.  Pulmonary:     Effort: Pulmonary effort is normal. No respiratory distress.     Breath sounds: Normal breath sounds. No wheezing or rales.  Abdominal:     General: Bowel sounds are normal. There is no distension.     Palpations: Abdomen is soft.     Tenderness: There is no abdominal tenderness. There is no rebound.  Musculoskeletal:        General: Tenderness present.     Cervical back: Normal range of motion.  Skin:     General: Skin is warm and dry.  Neurological:     Mental Status: She is alert and oriented to person, place, and time.     Coordination: Coordination normal.     Vitals:   02/08/21 0840  BP: (!) 160/92  Pulse: 89  Resp: 18  Temp: 98.4 F (36.9 C)  TempSrc: Oral  SpO2: 98%  Weight: 217 lb 9.6 oz (98.7 kg)  Height: 4\' 11"  (1.499 m)    This visit occurred during the SARS-CoV-2 public health emergency.  Safety protocols were in place, including screening questions prior to the visit, additional usage of staff PPE, and extensive cleaning of exam room while observing appropriate contact time as indicated for disinfecting solutions.   Assessment & Plan:

## 2021-02-09 ENCOUNTER — Encounter: Payer: Self-pay | Admitting: Internal Medicine

## 2021-02-09 NOTE — Assessment & Plan Note (Signed)
Advised her to proceed with injection to see if this helps. Referral done to neurosurgery to get second opinion about options. Rx gabapentin to help with pain as she has never tried this in the past.

## 2021-02-09 NOTE — Assessment & Plan Note (Signed)
Advised not to take any ibuprofen otc since she is taking max dose celebrex. It is okay to take tylenol in between if needed.

## 2021-02-09 NOTE — Assessment & Plan Note (Signed)
Rx small supply of amlodipine 10 mg daily to take 2-3 days prior to upcoming back injection in addition to irbesartan. She will likely not need long term as prior BP without back pain are at goal.

## 2021-02-12 ENCOUNTER — Telehealth: Payer: Self-pay | Admitting: Pharmacist

## 2021-02-12 NOTE — Progress Notes (Signed)
Chronic Care Management Pharmacy Assistant   Name: Veronica Garza  MRN: [id number] DOB: 01/10/1941   Reason for Encounter: Hypertension Disease State Call   Conditions to be addressed/monitored: HTN   Recent office visits:  02/08/21 Dr. Sharlet Salina, Internal Medicine  Recent consult visits:  [facility] visits:  None in previous 6 months  Medications: Outpatient Encounter Medications as of 02/12/2021  Medication Sig  . amLODipine (NORVASC) 10 MG tablet Take 1 tablet (10 mg total) by mouth daily.  . benzonatate (TESSALON PERLES) 100 MG capsule Take 1 capsule (100 mg total) by mouth 3 (three) times daily as needed.  . Calcium Carbonate-Vitamin D 600-400 MG-UNIT tablet Take 1 tablet by mouth 2 (two) times daily.  . celecoxib (CELEBREX) 200 MG capsule Take 200 mg by mouth in the morning and at bedtime.  . diclofenac Sodium (VOLTAREN) 1 % GEL Apply topically as needed (apply to arthritic areas).  Marland Kitchen doxycycline (VIBRA-TABS) 100 MG tablet Take 1 tablet (100 mg total) by mouth 2 (two) times daily.  Marland Kitchen gabapentin (NEURONTIN) 300 MG capsule Take 1 capsule (300 mg total) by mouth 2 (two) times daily.  Marland Kitchen ibuprofen (ADVIL) 200 MG tablet Take 600 mg by mouth at bedtime.  . irbesartan (AVAPRO) 300 MG tablet TAKE ONE TABLET BY MOUTH DAILY  . Multiple Vitamins-Minerals (MULTIVITAMIN WITH MINERALS) tablet Take 1 tablet by mouth daily.  . Multiple Vitamins-Minerals (PRESERVISION AREDS 2 PO) Take 1 capsule by mouth 2 (two) times daily.  Marland Kitchen omeprazole (PRILOSEC) 40 MG capsule TAKE ONE CAPSULE BY MOUTH DAILY  . oxyCODONE-acetaminophen (PERCOCET/ROXICET) 5-325 MG tablet Take 1 tablet by mouth 3 (three) times daily as needed.  Marland Kitchen PARoxetine (PAXIL) 10 MG tablet TAKE ONE TABLET BY MOUTH DAILY  . Propylene Glycol (SYSTANE BALANCE) 0.6 % SOLN Place 1 drop into both eyes 3 (three) times daily as needed (dry eyes).  . rosuvastatin (CRESTOR) 20 MG tablet Take 1 tablet (20 mg total) by mouth daily.  .  solifenacin (VESICARE) 10 MG tablet Take 1 tablet (10 mg total) by mouth daily.  . traMADol (ULTRAM) 50 MG tablet Take 50 mg by mouth 3 (three) times daily as needed.  . TURMERIC PO Take 1,000 mg by mouth daily. Take one by mouth daily   No facility-administered encounter medications on file as of 02/12/2021.     Star Rating Drugs: Irbesartan  Reviewed chart prior to disease state call. Spoke with patient regarding BP  Recent Office Vitals: BP Readings from Last 3 Encounters:  02/08/21 (!) 160/92  08/17/20 (!) 158/74  08/18/19 (!) 115/53   Pulse Readings from Last 3 Encounters:  02/08/21 89  08/17/20 69  08/18/19 (!) 58    Wt Readings from Last 3 Encounters:  02/08/21 217 lb 9.6 oz (98.7 kg)  08/17/20 210 lb (95.3 kg)  08/17/19 201 lb (91.2 kg)     Kidney Function Lab Results  Component Value Date/Time   CREATININE 0.91 08/17/2020 03:45 PM   CREATININE 0.92 08/18/2019 02:32 AM   CREATININE 1.01 (H) 08/17/2019 07:08 AM   GFR 88.49 05/10/2019 03:25 PM   GFRNONAA 61 08/17/2020 03:45 PM   GFRAA 71 08/17/2020 03:45 PM    BMP Latest Ref Rng & Units 08/17/2020 08/18/2019 08/17/2019  Glucose 65 - 99 mg/dL 94 167(H) 88  BUN 7 - 25 mg/dL 20 21 21   Creatinine 0.60 - 0.93 mg/dL 0.91 0.92 1.01(H)  BUN/Creat Ratio 6 - 22 (calc) NOT APPLICABLE - -  Sodium 235 - 146  mmol/L 141 135 138  Potassium 3.5 - 5.3 mmol/L 4.0 4.5 4.2  Chloride 98 - 110 mmol/L 102 104 105  CO2 20 - 32 mmol/L 27 23 24   Calcium 8.6 - 10.4 mg/dL 9.7 8.8(L) 9.8    . Current antihypertensive regimen: Irbesartan and temporary amlodipine before back injections she is having on 02/13/21  . How often are you checking your Blood Pressure? The patient states that she usually check her blood pressure one a week  . Current home BP readings: The patient states that last night her blood pressure and it was 158/83  . What recent interventions/DTPs have been made by any provider to improve Blood Pressure control since last  CPP Visit: The patient was started on amlodipine temporarily   . Any recent hospitalizations or ED visits since last visit with CPP? The patient has not had any hospital or ED visits  . What diet changes have been made to improve Blood Pressure Control? The patient states that she has not made any changes to diet  . What exercise is being done to improve your Blood Pressure Control? The patient states that bed=cause of her back pain it is to hard for her to get any exercise   Adherence Review: Is the patient currently on ACE/ARB medication? Yes, irbesartan Does the patient have >5 day gap between last estimated fill dates? No   Wendy Poet, [place] [phone]

## 2021-02-13 DIAGNOSIS — M5416 Radiculopathy, lumbar region: Secondary | ICD-10-CM | POA: Diagnosis not present

## 2021-02-14 ENCOUNTER — Other Ambulatory Visit: Payer: Self-pay | Admitting: Internal Medicine

## 2021-02-14 DIAGNOSIS — I10 Essential (primary) hypertension: Secondary | ICD-10-CM

## 2021-02-15 ENCOUNTER — Other Ambulatory Visit: Payer: Self-pay | Admitting: Internal Medicine

## 2021-02-15 DIAGNOSIS — N951 Menopausal and female climacteric states: Secondary | ICD-10-CM

## 2021-02-20 DIAGNOSIS — M5136 Other intervertebral disc degeneration, lumbar region: Secondary | ICD-10-CM | POA: Diagnosis not present

## 2021-02-20 DIAGNOSIS — M48061 Spinal stenosis, lumbar region without neurogenic claudication: Secondary | ICD-10-CM | POA: Diagnosis not present

## 2021-02-26 ENCOUNTER — Other Ambulatory Visit: Payer: Self-pay | Admitting: Internal Medicine

## 2021-03-13 DIAGNOSIS — I10 Essential (primary) hypertension: Secondary | ICD-10-CM | POA: Diagnosis not present

## 2021-03-13 DIAGNOSIS — M7918 Myalgia, other site: Secondary | ICD-10-CM | POA: Diagnosis not present

## 2021-03-13 DIAGNOSIS — Z6841 Body Mass Index (BMI) 40.0 and over, adult: Secondary | ICD-10-CM | POA: Diagnosis not present

## 2021-03-14 DIAGNOSIS — M5416 Radiculopathy, lumbar region: Secondary | ICD-10-CM | POA: Diagnosis not present

## 2021-03-14 DIAGNOSIS — G894 Chronic pain syndrome: Secondary | ICD-10-CM | POA: Diagnosis not present

## 2021-03-15 ENCOUNTER — Other Ambulatory Visit: Payer: Self-pay

## 2021-03-15 ENCOUNTER — Ambulatory Visit (INDEPENDENT_AMBULATORY_CARE_PROVIDER_SITE_OTHER): Payer: Medicare Other | Admitting: Internal Medicine

## 2021-03-15 ENCOUNTER — Encounter: Payer: Self-pay | Admitting: Internal Medicine

## 2021-03-15 VITALS — BP 140/90 | HR 77 | Temp 98.4°F | Resp 18 | Ht 59.0 in | Wt 218.6 lb

## 2021-03-15 DIAGNOSIS — I10 Essential (primary) hypertension: Secondary | ICD-10-CM

## 2021-03-15 DIAGNOSIS — G8929 Other chronic pain: Secondary | ICD-10-CM

## 2021-03-15 DIAGNOSIS — M545 Low back pain, unspecified: Secondary | ICD-10-CM

## 2021-03-15 DIAGNOSIS — R0602 Shortness of breath: Secondary | ICD-10-CM | POA: Diagnosis not present

## 2021-03-15 DIAGNOSIS — M7989 Other specified soft tissue disorders: Secondary | ICD-10-CM | POA: Insufficient documentation

## 2021-03-15 LAB — COMPREHENSIVE METABOLIC PANEL
ALT: 12 U/L (ref 0–35)
AST: 16 U/L (ref 0–37)
Albumin: 3.8 g/dL (ref 3.5–5.2)
Alkaline Phosphatase: 88 U/L (ref 39–117)
BUN: 21 mg/dL (ref 6–23)
CO2: 31 mEq/L (ref 19–32)
Calcium: 9.3 mg/dL (ref 8.4–10.5)
Chloride: 103 mEq/L (ref 96–112)
Creatinine, Ser: 0.99 mg/dL (ref 0.40–1.20)
GFR: 54.66 mL/min — ABNORMAL LOW (ref >60.00)
Glucose, Bld: 100 mg/dL — ABNORMAL HIGH (ref 70–99)
Potassium: 4.9 mEq/L (ref 3.5–5.1)
Sodium: 139 mEq/L (ref 135–145)
Total Bilirubin: 0.6 mg/dL (ref 0.2–1.2)
Total Protein: 6.7 g/dL (ref 6.0–8.3)

## 2021-03-15 LAB — CBC
HCT: 33.1 % — ABNORMAL LOW (ref 36.0–46.0)
Hemoglobin: 11.4 g/dL — ABNORMAL LOW (ref 12.0–15.0)
MCHC: 34.5 g/dL (ref 30.0–36.0)
MCV: 91.4 fl (ref 78.0–100.0)
Platelets: 187 10*3/uL (ref 150.0–400.0)
RBC: 3.62 Mil/uL — ABNORMAL LOW (ref 3.87–5.11)
RDW: 14.1 % (ref 11.5–15.5)
WBC: 6 10*3/uL (ref 4.0–10.5)

## 2021-03-15 LAB — BRAIN NATRIURETIC PEPTIDE: Pro B Natriuretic peptide (BNP): 116 pg/mL — ABNORMAL HIGH (ref 0.0–100.0)

## 2021-03-15 NOTE — Patient Instructions (Signed)
We are checking the blood work today and will call you back about the results.

## 2021-03-15 NOTE — Assessment & Plan Note (Signed)
It is unclear to me if she did indeed start amlodipine 10 mg daily or not. If she did this could be contributing to swelling in legs and talked to her and husband about this today. Checking CBC, CMP, BNP.

## 2021-03-15 NOTE — Assessment & Plan Note (Signed)
She is not sure if the gabapentin helped. I did advise her to continue with neurosurgeon for now with her workup and treatment. Explained some of the terminology to her and husband from MRI lumbar.

## 2021-03-15 NOTE — Assessment & Plan Note (Signed)
Suspect coming from obesity, inactivity due to pain, and varicose veins. Checking CBC, CMP, BNP to rule out additional cause.

## 2021-03-15 NOTE — Progress Notes (Signed)
   Subjective:   Patient ID: Veronica Garza, female    DOB: [birth year], 80 y.o.   MRN: [id number]  HPI The patient is a 80 YO female coming in for concerns about blood pressure (we had added amlodipine due to high BP, it is not on her medication list that she has with her, she does not think she is taking, is taking irbesartan, denies chest pains or headaches) and right hip pain (her neurosurgeon found some thing in the right hip and she is awaiting MRI, recent MRI low back they brought copy of which has arthritis changes with some nerve impingements, she was told she is not a surgical candidate, we had started gabapentin last month for her, she states the neurosurgeon told her it can cause leg swelling and stopped it) and leg swelling (her neurosurgeon was concerned about this, does not think it is caused by orthopedic issues, husband states this is present for months at least, not changed recently, some redness to the skin which he does not feel is changed, the legs do not hurt but severe pain in the right hip).   Review of Systems  Constitutional: Negative.  Negative for appetite change, chills, fatigue, fever and unexpected weight change.  HENT: Negative.   Eyes: Negative.   Respiratory: Negative.  Negative for cough, chest tightness and shortness of breath.   Cardiovascular: Positive for leg swelling. Negative for chest pain and palpitations.  Gastrointestinal: Negative.  Negative for abdominal distention, abdominal pain, constipation, diarrhea, nausea and vomiting.  Musculoskeletal: Positive for arthralgias and back pain. Negative for gait problem and joint swelling.  Skin: Negative.   Neurological: Negative.   Psychiatric/Behavioral: Negative.     Objective:  Physical Exam Constitutional:      Appearance: She is well-developed.  HENT:     Head: Normocephalic and atraumatic.  Cardiovascular:     Rate and Rhythm: Normal rate and regular rhythm.  Pulmonary:     Effort: Pulmonary  effort is normal. No respiratory distress.     Breath sounds: Normal breath sounds. No wheezing or rales.  Abdominal:     General: Bowel sounds are normal. There is no distension.     Palpations: Abdomen is soft.     Tenderness: There is no abdominal tenderness. There is no rebound.  Musculoskeletal:        General: Tenderness present.     Cervical back: Normal range of motion.     Right lower leg: Edema present.     Left lower leg: Edema present.     Comments: Both legs are large and 2+ edema to the knees or above bilateral non-pitting, there is not clear cellulitis, some varicose veins  Skin:    General: Skin is warm and dry.  Neurological:     Mental Status: She is alert and oriented to person, place, and time.     Coordination: Coordination normal.     Vitals:   03/15/21 0907  BP: 140/90  Pulse: 77  Resp: 18  Temp: 98.4 F (36.9 C)  TempSrc: Oral  SpO2: 97%  Weight: 218 lb 9.6 oz (99.2 kg)  Height: 4\' 11"  (1.499 m)    This visit occurred during the SARS-CoV-2 public health emergency.  Safety protocols were in place, including screening questions prior to the visit, additional usage of staff PPE, and extensive cleaning of exam room while observing appropriate contact time as indicated for disinfecting solutions.   Assessment & Plan:

## 2021-03-16 ENCOUNTER — Other Ambulatory Visit: Payer: Self-pay | Admitting: Internal Medicine

## 2021-03-16 DIAGNOSIS — N3281 Overactive bladder: Secondary | ICD-10-CM

## 2021-03-19 ENCOUNTER — Other Ambulatory Visit: Payer: Self-pay

## 2021-03-19 ENCOUNTER — Other Ambulatory Visit: Payer: Self-pay | Admitting: Internal Medicine

## 2021-03-19 ENCOUNTER — Emergency Department (HOSPITAL_COMMUNITY)
Admission: EM | Admit: 2021-03-19 | Discharge: 2021-03-19 | Disposition: A | Discharge status: Home / Self Care | Payer: Medicare Other | Attending: Emergency Medicine | Admitting: Emergency Medicine

## 2021-03-19 ENCOUNTER — Emergency Department (HOSPITAL_COMMUNITY): Payer: Medicare Other

## 2021-03-19 ENCOUNTER — Telehealth: Payer: Self-pay | Admitting: Pharmacist

## 2021-03-19 ENCOUNTER — Encounter (HOSPITAL_COMMUNITY): Payer: Self-pay | Admitting: Emergency Medicine

## 2021-03-19 DIAGNOSIS — R202 Paresthesia of skin: Secondary | ICD-10-CM | POA: Diagnosis not present

## 2021-03-19 DIAGNOSIS — I110 Hypertensive heart disease with heart failure: Secondary | ICD-10-CM | POA: Insufficient documentation

## 2021-03-19 DIAGNOSIS — M5442 Lumbago with sciatica, left side: Secondary | ICD-10-CM | POA: Insufficient documentation

## 2021-03-19 DIAGNOSIS — Z85828 Personal history of other malignant neoplasm of skin: Secondary | ICD-10-CM | POA: Diagnosis not present

## 2021-03-19 DIAGNOSIS — Z96612 Presence of left artificial shoulder joint: Secondary | ICD-10-CM | POA: Insufficient documentation

## 2021-03-19 DIAGNOSIS — M5440 Lumbago with sciatica, unspecified side: Secondary | ICD-10-CM

## 2021-03-19 DIAGNOSIS — Z96651 Presence of right artificial knee joint: Secondary | ICD-10-CM | POA: Diagnosis not present

## 2021-03-19 DIAGNOSIS — Z79899 Other long term (current) drug therapy: Secondary | ICD-10-CM | POA: Insufficient documentation

## 2021-03-19 DIAGNOSIS — R0602 Shortness of breath: Secondary | ICD-10-CM | POA: Diagnosis not present

## 2021-03-19 DIAGNOSIS — M545 Low back pain, unspecified: Secondary | ICD-10-CM | POA: Diagnosis not present

## 2021-03-19 DIAGNOSIS — I5030 Unspecified diastolic (congestive) heart failure: Secondary | ICD-10-CM | POA: Diagnosis not present

## 2021-03-19 DIAGNOSIS — Z96611 Presence of right artificial shoulder joint: Secondary | ICD-10-CM | POA: Insufficient documentation

## 2021-03-19 DIAGNOSIS — M25552 Pain in left hip: Secondary | ICD-10-CM | POA: Insufficient documentation

## 2021-03-19 LAB — CBC
HCT: 33.1 % — ABNORMAL LOW (ref 36.0–46.0)
Hemoglobin: 11.2 g/dL — ABNORMAL LOW (ref 12.0–15.0)
MCH: 31.5 pg (ref 26.0–34.0)
MCHC: 33.8 g/dL (ref 30.0–36.0)
MCV: 93 fL (ref 80.0–100.0)
Platelets: 179 10*3/uL (ref 150–400)
RBC: 3.56 MIL/uL — ABNORMAL LOW (ref 3.87–5.11)
RDW: 13.3 % (ref 11.5–15.5)
WBC: 5.7 10*3/uL (ref 4.0–10.5)
nRBC: 0 % (ref 0.0–0.2)

## 2021-03-19 LAB — BASIC METABOLIC PANEL
Anion gap: 8 (ref 5–15)
BUN: 16 mg/dL (ref 8–23)
CO2: 26 mmol/L (ref 22–32)
Calcium: 9.3 mg/dL (ref 8.9–10.3)
Chloride: 105 mmol/L (ref 98–111)
Creatinine, Ser: 1.06 mg/dL — ABNORMAL HIGH (ref 0.44–1.00)
GFR, Estimated: 54 mL/min — ABNORMAL LOW (ref >60)
Glucose, Bld: 108 mg/dL — ABNORMAL HIGH (ref 70–99)
Potassium: 4.4 mmol/L (ref 3.5–5.1)
Sodium: 139 mmol/L (ref 135–145)

## 2021-03-19 MED ORDER — CYCLOBENZAPRINE HCL 10 MG PO TABS: 10.0000 mg | ORAL_TABLET | Freq: Two times a day (BID) | ORAL | 0 refills | Status: DC | PRN

## 2021-03-19 MED ORDER — DIAZEPAM 5 MG/ML IJ SOLN
2.5000 mg | Freq: Once | INTRAMUSCULAR | Status: AC
  Administered 2021-03-19: 2.5 mg via INTRAVENOUS
  Filled 2021-03-19: qty 2

## 2021-03-19 MED ORDER — METHYLPREDNISOLONE 4 MG PO TBPK: ORAL_TABLET | ORAL | 0 refills | Status: DC

## 2021-03-19 MED ORDER — FENTANYL CITRATE (PF) 100 MCG/2ML IJ SOLN
50.0000 ug | Freq: Once | INTRAMUSCULAR | Status: AC
  Administered 2021-03-19: 50 ug via INTRAVENOUS
  Filled 2021-03-19: qty 2

## 2021-03-19 NOTE — ED Triage Notes (Signed)
C/o lower back pain since December.  States she is supposed to have MRI on 4/27.  Denies numbness.  Denies incontinence.

## 2021-03-19 NOTE — Progress Notes (Signed)
Chronic Care Management Pharmacy Assistant   Name: Veronica Garza  MRN: [id number] DOB: 01/08/1941   Reason for Encounter: Hypertension Disease State Call   Conditions to be addressed/monitored: HTN   Recent office visits:  03/15/21 Dr. Sharlet Salina, discontinued doxycycline patient completed and oxycodone change in therapy  Recent consult visits:  None ID  Hospital visits:  None in previous 6 months  Medications: Outpatient Encounter Medications as of 03/19/2021  Medication Sig  . amLODipine (NORVASC) 10 MG tablet Take 1 tablet (10 mg total) by mouth daily.  . benzonatate (TESSALON PERLES) 100 MG capsule Take 1 capsule (100 mg total) by mouth 3 (three) times daily as needed. (Patient not taking: Reported on 03/15/2021)  . Calcium Carbonate-Vitamin D 600-400 MG-UNIT tablet Take 1 tablet by mouth 2 (two) times daily.  . celecoxib (CELEBREX) 200 MG capsule Take 200 mg by mouth in the morning and at bedtime.  . diclofenac Sodium (VOLTAREN) 1 % GEL Apply topically as needed (apply to arthritic areas).  . gabapentin (NEURONTIN) 300 MG capsule Take 1 capsule (300 mg total) by mouth 2 (two) times daily.  Marland Kitchen HYDROcodone-acetaminophen (NORCO) 10-325 MG tablet Take 1 tablet by mouth 4 (four) times daily as needed.  Marland Kitchen ibuprofen (ADVIL) 200 MG tablet Take 600 mg by mouth at bedtime.  . irbesartan (AVAPRO) 300 MG tablet TAKE ONE TABLET BY MOUTH DAILY  . Multiple Vitamins-Minerals (MULTIVITAMIN WITH MINERALS) tablet Take 1 tablet by mouth daily.  . Multiple Vitamins-Minerals (PRESERVISION AREDS 2 PO) Take 1 capsule by mouth 2 (two) times daily.  Marland Kitchen omeprazole (PRILOSEC) 40 MG capsule TAKE ONE CAPSULE BY MOUTH DAILY  . PARoxetine (PAXIL) 10 MG tablet TAKE ONE TABLET BY MOUTH DAILY  . Propylene Glycol (SYSTANE BALANCE) 0.6 % SOLN Place 1 drop into both eyes 3 (three) times daily as needed (dry eyes).  . rosuvastatin (CRESTOR) 20 MG tablet Take 1 tablet (20 mg total) by mouth daily.  . solifenacin  (VESICARE) 10 MG tablet Take 1 tablet (10 mg total) by mouth daily.  . traMADol (ULTRAM) 50 MG tablet Take 50 mg by mouth 3 (three) times daily as needed.  . TURMERIC PO Take 1,000 mg by mouth daily. Take one by mouth daily   No facility-administered encounter medications on file as of 03/19/2021.   Reviewed chart prior to disease state call. Spoke with patient regarding BP  Recent Office Vitals: BP Readings from Last 3 Encounters:  03/15/21 140/90  02/08/21 (!) 160/92  08/17/20 (!) 158/74   Pulse Readings from Last 3 Encounters:  03/15/21 77  02/08/21 89  08/17/20 69    Wt Readings from Last 3 Encounters:  03/15/21 218 lb 9.6 oz (99.2 kg)  02/08/21 217 lb 9.6 oz (98.7 kg)  08/17/20 210 lb (95.3 kg)     Kidney Function Lab Results  Component Value Date/Time   CREATININE 0.99 03/15/2021 10:04 AM   CREATININE 0.91 08/17/2020 03:45 PM   CREATININE 0.92 08/18/2019 02:32 AM   GFR 54.66 (L) 03/15/2021 10:04 AM   GFRNONAA 61 08/17/2020 03:45 PM   GFRAA 71 08/17/2020 03:45 PM    BMP Latest Ref Rng & Units 03/15/2021 08/17/2020 08/18/2019  Glucose 70 - 99 mg/dL 100(H) 94 167(H)  BUN 6 - 23 mg/dL 21 20 21   Creatinine 0.40 - 1.20 mg/dL 0.99 0.91 0.92  BUN/Creat Ratio 6 - 22 (calc) - NOT APPLICABLE -  Sodium 850 - 145 mEq/L 139 141 135  Potassium 3.5 - 5.1 mEq/L 4.9 4.0  4.5  Chloride 96 - 112 mEq/L 103 102 104  CO2 19 - 32 mEq/L 31 27 23   Calcium 8.4 - 10.5 mg/dL 9.3 9.7 8.8(L)    . Current antihypertensive regimen: The patient states that she is taking irbesartan 300 1 tab daily and amlodipine 10 mg 1 tab daily  . How often are you checking your Blood Pressure? Spoke with patient husband who states that patient has not taken blood pressure in a few weeks  . Current home BP readings: The patient last blood pressure reading was 140/90 at Dr. Nathanial Millman office on 03/15/21  . What recent interventions/DTPs have been made by any provider to improve Blood Pressure control since last  CPP Visit: To continue current medications, patient husband not sure if she is suppose to be taking amlodipine .  Marland Kitchen Any recent hospitalizations or ED visits since last visit with CPP? The patient was packing up getting ready to go to the hospital today due to severe pain in her side and buttocks. States that she is to have an MRI done on 03/28/21 ordered by Dr. Sherley Bounds. Patient husband states that she can't wait that long and he is taking her to the hospital.  . What diet changes have been made to improve Blood Pressure Control?  The patient states that she has not made any changes to her medications at this time  . What exercise is being done to improve your Blood Pressure Control?  The patient states that due to her pain in her hip she cannot exercise  Adherence Review: Is the patient currently on ACE/ARB medication? Yes, Irbesartan   Does the patient have >5 day gap between last estimated fill dates? No  Star Rating Drugs: Irbesartan 02/14/21 90 ds Rosuvastatin 02/26/21 90 ds   Ethelene Hal Clinical Pharmacist Assistant [phone]  Time spent:40

## 2021-03-19 NOTE — Discharge Instructions (Addendum)
Recommend purchasing lidocaine patches as well for pain.  Be careful with Flexeril as it can make you sleepy especially in addition to your narcotic pain medicine.

## 2021-03-19 NOTE — ED Triage Notes (Signed)
Emergency Medicine Provider Triage Evaluation Note  Veronica Garza , a 80 y.o. female  was evaluated in triage.  Pt complains of back pain that is chronic but worsening. She follows with neurosurgery  Review of Systems  Positive: Back pain Negative: No incontinence, numbness/weakness  Physical Exam  BP (!) 180/91 (BP Location: Left Arm)   Pulse 77   Temp 98.1 F (36.7 C) (Oral)   Resp (!) 26   SpO2 100%  Gen:   Awake, no distress  HEENT:  Atraumatic  Resp:  Normal effort  Cardiac:  Normal rate  Abd:   Nondistended, nontender  MSK:   Strength and sensation of the ble is intact Neuro:  Speech clear   Medical Decision Making  Medically screening exam initiated at 2:13 PM.  Appropriate orders placed.  Veronica Garza was informed that the remainder of the evaluation will be completed by another provider, this initial triage assessment does not replace that evaluation, and the importance of remaining in the ED until their evaluation is complete.  Clinical Impression   80 y/o F c/o back pain  MSE was initiated and I personally evaluated the patient and placed orders (if any) at  2:13 PM on March 19, 2021.  The patient appears stable so that the remainder of the MSE may be completed by another provider.    Rodney Booze, [place] 03/19/21 1413

## 2021-03-19 NOTE — ED Provider Notes (Signed)
[facility] EMERGENCY DEPARTMENT Provider Note   CSN: [id number] Arrival date & time: 03/19/21  1405     History Chief Complaint  Patient presents with  . Back Pain    Veronica Garza is a 80 y.o. female.  The history is provided by the patient.  Back Pain Location:  Lumbar spine and gluteal region Quality:  Aching Radiates to:  L thigh Pain severity:  Severe Onset quality:  Gradual Timing:  Intermittent Progression:  Waxing and waning Chronicity:  Chronic Context: not recent injury   Relieved by:  Nothing Worsened by:  Palpation and ambulation Associated symptoms: tingling   Associated symptoms: no abdominal pain, no abdominal swelling, no bladder incontinence, no bowel incontinence, no chest pain, no dysuria, no fever, no headaches, no leg pain, no numbness, no paresthesias, no pelvic pain, no perianal numbness, no weakness and no weight loss        Past Medical History:  Diagnosis Date  . Anemia   . Arthritis   . BCC (basal cell carcinoma) 07/19/2014   Right cheek  . Cataract   . Complication of anesthesia    takes patient a long time to wake up  . GERD (gastroesophageal reflux disease)   . Hx of shoulder replacement    lt  . Hypertension   . OSA (obstructive sleep apnea)    use CPAP  . Pneumonia    History of  . Urinary incontinence     Patient Active Problem List   Diagnosis Date Noted  . Leg swelling 03/15/2021  . Jet lag syndrome 05/16/2020  . Status post total right knee replacement 08/17/2019  . OAB (overactive bladder) 06/22/2019  . ILD (interstitial lung disease) ([place]) 12/15/2018  . Enlarged heart 06/16/2018  . Morbid obesity ([place]) 10/17/2017  . 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 Hot flashes, menopausal 12/31/2016  . DDD (degenerative disc disease), lumbar 11/06/2015  . Right-sided low back pain without sciatica 06/21/2015  . Diastolic dysfunction without heart failure 11/13/2014  . Other abnormal glucose  05/27/2013  . Osteopenia of the elderly 05/27/2013  . Hyperlipidemia LDL goal <130 09/04/2012  . Esophageal stricture 09/12/2011  . Visit for screening mammogram 08/08/2011  . Routine general medical examination at a health care facility 08/08/2011  . Essential hypertension, benign 01/14/2011  . URINARY INCONTINENCE 12/14/2008  . OA (osteoarthritis) 07/21/2008  . Obstructive sleep apnea 10/14/2007  . GERD 10/14/2007    Past Surgical History:  Procedure Laterality Date  . ABDOMINAL HYSTERECTOMY    . EYE SURGERY     bilateral cataract extraction  . FINGER SURGERY     cyst left middle finger  . JOINT REPLACEMENT     left shoulder  . NASAL SINUS SURGERY    . SHOULDER ARTHROSCOPY     Left  . TOTAL KNEE ARTHROPLASTY Right 08/17/2019   Procedure: TOTAL KNEE ARTHROPLASTY;  Surgeon: Paralee Cancel, MD;  Location: [facility];  Service: Orthopedics;  Laterality: Right;  70 mins  . TOTAL SHOULDER ARTHROPLASTY  10/10/2011   Procedure: TOTAL SHOULDER ARTHROPLASTY;  Surgeon: Metta Clines Supple;  Location: [facility];  Service: Orthopedics;  Laterality: Right;  . UPPER GASTROINTESTINAL ENDOSCOPY    . Vaginal Sling       OB History   No obstetric history on file.     Family History  Problem Relation Age of Onset  . Cancer Father        Prostate cancer  . Cancer Sister  Ovarian cancer  . Diabetes Sister   . Alzheimer's disease Sister   . Alzheimer's disease Mother   . COPD Neg Hx   . Alcohol abuse Neg Hx   . Early death Neg Hx   . Heart disease Neg Hx   . Hyperlipidemia Neg Hx   . Hypertension Neg Hx   . Kidney disease Neg Hx   . Stroke Neg Hx     Social History   Tobacco Use  . Smoking status: Never Smoker  . Smokeless tobacco: Never Used  Vaping Use  . Vaping Use: Never used  Substance Use Topics  . Alcohol use: No  . Drug use: No    Home Medications Prior to Admission medications   Medication Sig Start Date End Date Taking? Authorizing Provider  cyclobenzaprine  (FLEXERIL) 10 MG tablet Take 1 tablet (10 mg total) by mouth 2 (two) times daily as needed for muscle spasms. 03/19/21  Yes Tyhir Schwan, DO  methylPREDNISolone (MEDROL DOSEPAK) 4 MG TBPK tablet Follow package insert 03/19/21  Yes Isair Inabinet, DO  amLODipine (NORVASC) 10 MG tablet Take 1 tablet (10 mg total) by mouth daily. 02/08/21   Hoyt Koch, MD  benzonatate (TESSALON PERLES) 100 MG capsule Take 1 capsule (100 mg total) by mouth 3 (three) times daily as needed. Patient not taking: Reported on 03/15/2021 10/12/20   Lucretia Kern, DO  Calcium Carbonate-Vitamin D 600-400 MG-UNIT tablet Take 1 tablet by mouth 2 (two) times daily.    [provider]  celecoxib (CELEBREX) 200 MG capsule Take 200 mg by mouth in the morning and at bedtime.    [provider]  diclofenac Sodium (VOLTAREN) 1 % GEL Apply topically as needed (apply to arthritic areas).    [provider]  gabapentin (NEURONTIN) 300 MG capsule Take 1 capsule (300 mg total) by mouth 2 (two) times daily. 02/27/21   Hoyt Koch, MD  HYDROcodone-acetaminophen (NORCO) 10-325 MG tablet Take 1 tablet by mouth 4 (four) times daily as needed. 03/14/21   [provider]  ibuprofen (ADVIL) 200 MG tablet Take 600 mg by mouth at bedtime.    [provider]  irbesartan (AVAPRO) 300 MG tablet TAKE ONE TABLET BY MOUTH DAILY 02/14/21   Janith Lima, MD  Multiple Vitamins-Minerals (MULTIVITAMIN WITH MINERALS) tablet Take 1 tablet by mouth daily.    [provider]  Multiple Vitamins-Minerals (PRESERVISION AREDS 2 PO) Take 1 capsule by mouth 2 (two) times daily.    [provider]  omeprazole (PRILOSEC) 40 MG capsule TAKE ONE CAPSULE BY MOUTH DAILY 06/28/20   Mauri Pole, MD  PARoxetine (PAXIL) 10 MG tablet TAKE ONE TABLET BY MOUTH DAILY 02/15/21   Janith Lima, MD  Propylene Glycol (SYSTANE BALANCE) 0.6 % SOLN Place 1 drop into both eyes 3 (three) times daily as  needed (dry eyes).    [provider]  rosuvastatin (CRESTOR) 20 MG tablet Take 1 tablet (20 mg total) by mouth daily. 11/21/20   Janith Lima, MD  solifenacin (VESICARE) 10 MG tablet Take 1 tablet (10 mg total) by mouth daily. 03/16/21   Janith Lima, MD  traMADol (ULTRAM) 50 MG tablet Take 50 mg by mouth 3 (three) times daily as needed.    [provider]  TURMERIC PO Take 1,000 mg by mouth daily. Take one by mouth daily    [provider]    Allergies    Tolterodine, Iohexol, Naproxen, Neomycin-bacitracin zn-polymyx, and Sulfonamide derivatives  Review of Systems   Review of Systems  Constitutional: Negative for chills, fever and weight loss.  HENT: Negative for ear pain and sore throat.   Eyes: Negative for pain and visual disturbance.  Respiratory: Negative for cough and shortness of breath.   Cardiovascular: Negative for chest pain and palpitations.  Gastrointestinal: Negative for abdominal pain, bowel incontinence and vomiting.  Genitourinary: Negative for bladder incontinence, dysuria, hematuria and pelvic pain.  Musculoskeletal: Positive for back pain and gait problem. Negative for arthralgias.  Skin: Negative for color change and rash.  Neurological: Positive for tingling. Negative for seizures, syncope, weakness, numbness, headaches and paresthesias.  All other systems reviewed and are negative.   Physical Exam Updated Vital Signs BP 136/71   Pulse 78   Temp 98.1 F (36.7 C) (Oral)   Resp 14   SpO2 93%   Physical Exam Vitals and nursing note reviewed.  Constitutional:      General: She is not in acute distress.    Appearance: She is well-developed. She is not ill-appearing.  HENT:     Head: Normocephalic and atraumatic.     Mouth/Throat:     Mouth: Mucous membranes are moist.  Eyes:     Extraocular Movements: Extraocular movements intact.     Conjunctiva/sclera: Conjunctivae normal.     Pupils: Pupils are equal, round, and  reactive to light.  Cardiovascular:     Rate and Rhythm: Normal rate and regular rhythm.     Pulses: Normal pulses.     Heart sounds: Normal heart sounds. No murmur heard.   Pulmonary:     Effort: Pulmonary effort is normal. No respiratory distress.     Breath sounds: Normal breath sounds.  Abdominal:     Palpations: Abdomen is soft.     Tenderness: There is no abdominal tenderness.  Musculoskeletal:        General: Tenderness present.     Cervical back: Normal range of motion and neck supple. No tenderness.     Comments: No midline spinal tenderness, tenderness to paraspinal lumbar muscles on the left as well as left gluteal area with increased tone  Skin:    General: Skin is warm and dry.  Neurological:     General: No focal deficit present.     Mental Status: She is alert and oriented to person, place, and time.     Cranial Nerves: No cranial nerve deficit.     Sensory: No sensory deficit.     Motor: No weakness.     Coordination: Coordination normal.     Comments: 5+ out of 5 strength throughout, normal sensation, no drift, normal finger-to-nose finger     ED Results / Procedures / Treatments   Labs (all labs ordered are listed, but only abnormal results are displayed) Labs Reviewed  CBC - Abnormal; Notable for the following components:      Result Value   RBC 3.56 (*)    Hemoglobin 11.2 (*)    HCT 33.1 (*)    All other components within normal limits  BASIC METABOLIC PANEL - Abnormal; Notable for the following components:   Glucose, Bld 108 (*)    Creatinine, Ser 1.06 (*)    GFR, Estimated 54 (*)    All other components within normal limits    EKG None  Radiology DG Chest 2 View  Result Date: 03/19/2021 CLINICAL DATA:  Short of breath EXAM: CHEST - 2 VIEW COMPARISON:  05/19/2018 FINDINGS: The heart size and mediastinal contours are within normal  limits. Both lungs are clear. The visualized skeletal structures are unremarkable. Bilateral shoulder replacement  IMPRESSION: No active cardiopulmonary disease. Electronically Signed   By: Franchot Gallo M.D.   On: 03/19/2021 17:59   DG Lumbar Spine Complete  Result Date: 03/19/2021 CLINICAL DATA:  Left hip pain.  Back pain. EXAM: LUMBAR SPINE - COMPLETE 4+ VIEW COMPARISON:  10/31/2015 FINDINGS: Mild anterolisthesis L4-5.  Mild lumbar scoliosis. Negative for fracture. Multilevel disc degeneration and spurring throughout the lumbar spine. Interbody fusion L2-3 unchanged prior study. IMPRESSION: Scoliosis and lumbar degenerative change. No acute abnormality and no change from the prior study 2016. Electronically Signed   By: Franchot Gallo M.D.   On: 03/19/2021 18:01   DG Hip Unilat With Pelvis 2-3 Views Left  Result Date: 03/19/2021 CLINICAL DATA:  Left hip pain EXAM: DG HIP (WITH OR WITHOUT PELVIS) 2-3V LEFT COMPARISON:  None. FINDINGS: Normal hip joint. No fracture or AVN. No bone lesion in the pelvis. Mild degenerative change lumbar spine. IMPRESSION: Negative left hip Electronically Signed   By: Franchot Gallo M.D.   On: 03/19/2021 18:00    Procedures Procedures   Medications Ordered in ED Medications  fentaNYL (SUBLIMAZE) injection 50 mcg (50 mcg Intravenous Given 03/19/21 1749)  diazepam (VALIUM) injection 2.5 mg (2.5 mg Intravenous Given 03/19/21 1842)    ED Course  I have reviewed the triage vital signs and the nursing notes.  Pertinent labs & imaging results that were available during my care of the patient were reviewed by me and considered in my medical decision making (see chart for details).    MDM Rules/Calculators/A&P                          Veronica Garza is a 80 year old female with history of hypertension, chronic back pain who presents to the ED with back pain, left hip pain.  Normal vitals.  No fever.  No symptoms consistent with cauda equina.  Seems to be having worsening radicular pain/sciatic pain.  Has followed closely with neurosurgery and spine.  Recent MRI of her low back  was fairly unremarkable.  She is scheduled for MRI of her head next week to rule out any hip pathology.  X-ray of her hip and low back are overall unremarkable.  Overall suspect nerve irritation causing muscle spasm.  Patient with great improvement with fentanyl and Valium.  Will prescribe Medrol Dosepak, Flexeril, recommend lidocaine patches.  She has good follow-up with pain management, neurosurgery as well as MRI ordered next week.  She understands return precautions.  Discharged in ED in good condition.  This chart was dictated using voice recognition software.  Despite best efforts to proofread,  errors can occur which can change the documentation meaning.    Final Clinical Impression(s) / ED Diagnoses Final diagnoses:  Acute left-sided low back pain with sciatica, sciatica laterality unspecified    Rx / DC Orders ED Discharge Orders         Ordered    methylPREDNISolone (MEDROL DOSEPAK) 4 MG TBPK tablet        03/19/21 1942    cyclobenzaprine (FLEXERIL) 10 MG tablet  2 times daily PRN        03/19/21 1942           Lennice Sites, DO 03/19/21 1943

## 2021-03-20 ENCOUNTER — Ambulatory Visit (INDEPENDENT_AMBULATORY_CARE_PROVIDER_SITE_OTHER): Payer: Medicare Other | Admitting: Family Medicine

## 2021-03-21 ENCOUNTER — Telehealth: Payer: Self-pay | Admitting: Internal Medicine

## 2021-03-21 ENCOUNTER — Ambulatory Visit (HOSPITAL_COMMUNITY): Payer: Medicare Other | Attending: Cardiovascular Disease

## 2021-03-21 ENCOUNTER — Telehealth: Payer: Self-pay | Admitting: Emergency Medicine

## 2021-03-21 ENCOUNTER — Other Ambulatory Visit: Payer: Self-pay

## 2021-03-21 DIAGNOSIS — R0602 Shortness of breath: Secondary | ICD-10-CM | POA: Diagnosis not present

## 2021-03-21 LAB — ECHOCARDIOGRAM COMPLETE
Area-P 1/2: 2.83 cm2
P 1/2 time: 446 msec
S' Lateral: 3.5 cm

## 2021-03-21 NOTE — Telephone Encounter (Signed)
See below

## 2021-03-21 NOTE — Telephone Encounter (Signed)
Pt is requesting to switch care from Dr Ronnald Ramp to Dr Sharlet Salina.

## 2021-03-21 NOTE — Telephone Encounter (Signed)
Spoke with the patient in regards to her results yesterday and she verbalized understanding and repeated the results back to me.

## 2021-03-21 NOTE — Telephone Encounter (Signed)
Patients son called and is requesting someone to call his mother back in regards to the order for an echo. She can be reached at [phone]. Please advise

## 2021-03-22 NOTE — Telephone Encounter (Signed)
See below

## 2021-03-22 NOTE — Telephone Encounter (Signed)
Yes, ok with me 

## 2021-03-27 NOTE — Telephone Encounter (Signed)
Fine but should continue to get care with PCP until switch.

## 2021-03-28 DIAGNOSIS — M7918 Myalgia, other site: Secondary | ICD-10-CM | POA: Diagnosis not present

## 2021-03-28 DIAGNOSIS — M160 Bilateral primary osteoarthritis of hip: Secondary | ICD-10-CM | POA: Diagnosis not present

## 2021-03-29 DIAGNOSIS — M7918 Myalgia, other site: Secondary | ICD-10-CM | POA: Diagnosis not present

## 2021-03-29 DIAGNOSIS — Z6841 Body Mass Index (BMI) 40.0 and over, adult: Secondary | ICD-10-CM | POA: Diagnosis not present

## 2021-03-29 DIAGNOSIS — M5416 Radiculopathy, lumbar region: Secondary | ICD-10-CM | POA: Diagnosis not present

## 2021-04-03 ENCOUNTER — Ambulatory Visit (INDEPENDENT_AMBULATORY_CARE_PROVIDER_SITE_OTHER): Payer: Medicare Other | Admitting: Family Medicine

## 2021-04-04 ENCOUNTER — Other Ambulatory Visit: Payer: Self-pay | Admitting: Neurological Surgery

## 2021-04-04 DIAGNOSIS — M5136 Other intervertebral disc degeneration, lumbar region: Secondary | ICD-10-CM | POA: Diagnosis not present

## 2021-04-04 DIAGNOSIS — M48061 Spinal stenosis, lumbar region without neurogenic claudication: Secondary | ICD-10-CM

## 2021-04-10 ENCOUNTER — Other Ambulatory Visit: Payer: Self-pay

## 2021-04-10 ENCOUNTER — Ambulatory Visit (INDEPENDENT_AMBULATORY_CARE_PROVIDER_SITE_OTHER): Payer: Medicare Other | Admitting: Pharmacist

## 2021-04-10 DIAGNOSIS — E785 Hyperlipidemia, unspecified: Secondary | ICD-10-CM

## 2021-04-10 DIAGNOSIS — G8929 Other chronic pain: Secondary | ICD-10-CM

## 2021-04-10 DIAGNOSIS — I10 Essential (primary) hypertension: Secondary | ICD-10-CM

## 2021-04-10 DIAGNOSIS — N3281 Overactive bladder: Secondary | ICD-10-CM

## 2021-04-10 NOTE — Progress Notes (Signed)
Chronic Care Management Pharmacy Note  04/15/2021 Name:  Veronica Garza MRN:  [id number] DOB:  Dec 22, 1940  Subjective: Veronica Garza is an 80 y.o. year old female who is a primary patient of Janith Lima, MD.  The CCM team was consulted for assistance with disease management and care coordination needs.    Engaged with patient by telephone for follow up visit in response to provider referral for pharmacy case management and/or care coordination services.   Consent to Services:  The patient was given information about Chronic Care Management services, agreed to services, and gave verbal consent prior to initiation of services.  Please see initial visit note for detailed documentation.   Patient Care Team: Janith Lima, MD as PCP - General Rigoberto Noel, MD as Consulting Physician (Pulmonary Disease) Mauri Pole, MD as Consulting Physician (Gastroenterology) Charlton Haws, [facility] as Pharmacist (Pharmacist)  Recent office visits: 03/15/20 Dr Sharlet Salina OV: c/o leg swelling, BP high. BNP elevated, ordered ECHO. ECHO normal for age, not causing swelling.  02/08/21 Dr Sharlet Salina: BP elevated, back pain. Rx'd amlodipine 10 mg for 2-3 days prior to back injection (BP was previously controlled w/o back pain) and gabapentin 300 mg BID. Referred to spine specialist.  08/17/20 Dr Ronnald Ramp OV: chronic f/u, c/o frequent urination. Rx'd Gemtesa 75 mg to replace tolterodine. Eventually changed to solifenacin 10 mg.  Recent consult visits: 01/18/21 Dr Nelva Bush (pain mgmt): injection for back pain given.  Hospital visits: Medication Reconciliation was completed by comparing discharge summary, patient's EMR and Pharmacy list, and upon discussion with patient.  Admitted to the ED on 03/19/21 due to back pain. Discharge date was 03/19/21. Discharged from [facility] ED.  Given Fentanyl and diazepam in ED.  New?Medications Started at [facility] Discharge:?? -started cyclobenzaprine 10 mg BID and  Medrol dosepak due to back pain  Medications that remain the same after Hospital Discharge:??  -All other medications will remain the same.    Objective:  Lab Results  Component Value Date   CREATININE 1.06 (H) 03/19/2021   BUN 16 03/19/2021   GFR 54.66 (L) 03/15/2021   GFRNONAA 54 (L) 03/19/2021   GFRAA 71 08/17/2020   NA 139 03/19/2021   K 4.4 03/19/2021   CALCIUM 9.3 03/19/2021   CO2 26 03/19/2021    Lab Results  Component Value Date/Time   HGBA1C 5.6 08/17/2020 03:45 PM   HGBA1C 5.8 05/19/2019 02:25 PM   GFR 54.66 (L) 03/15/2021 10:04 AM   GFR 88.49 05/10/2019 03:25 PM    Last diabetic Eye exam: No results found for: HMDIABEYEEXA  Last diabetic Foot exam: No results found for: HMDIABFOOTEX   Lab Results  Component Value Date   CHOL 159 08/17/2020   HDL 69 08/17/2020   LDLCALC 66 08/17/2020   LDLDIRECT 119.5 09/04/2012   TRIG 161 (H) 08/17/2020   CHOLHDL 2.3 08/17/2020    Hepatic Function Latest Ref Rng & Units 03/15/2021 08/17/2020 05/10/2019  Total Protein 6.0 - 8.3 g/dL 6.7 7.2 6.8  Albumin 3.5 - 5.2 g/dL 3.8 - 4.1  AST 0 - 37 U/L 16 24 22   ALT 0 - 35 U/L 12 20 19   Alk Phosphatase 39 - 117 U/L 88 - 119(H)  Total Bilirubin 0.2 - 1.2 mg/dL 0.6 0.7 0.5  Bilirubin, Direct 0.0 - 0.2 mg/dL - 0.2 -    Lab Results  Component Value Date/Time   TSH 1.79 08/17/2020 03:45 PM   TSH 1.74 05/19/2019 02:25 PM    CBC  Latest Ref Rng & Units 03/19/2021 03/15/2021 08/17/2020  WBC 4.0 - 10.5 K/uL 5.7 6.0 7.2  Hemoglobin 12.0 - 15.0 g/dL 11.2(L) 11.4(L) 12.9  Hematocrit 36.0 - 46.0 % 33.1(L) 33.1(L) 39.3  Platelets 150 - 400 K/uL 179 187.0 192    No results found for: VD25OH  Clinical ASCVD: No  The 10-year ASCVD risk score Mikey Bussing DC Jr., et al., 2013) is: 20.7%   Values used to calculate the score:     Age: 54 years     Sex: Female     Is Non-Hispanic African American: No     Diabetic: No     Tobacco smoker: No     Systolic Blood Pressure: 076 mmHg     Is BP  treated: Yes     HDL Cholesterol: 69 mg/dL     Total Cholesterol: 159 mg/dL    Depression screen [facility] 2/9 08/17/2020 06/22/2019 05/19/2019  Decreased Interest 0 0 0  Down, Depressed, Hopeless 0 0 0  PHQ - 2 Score 0 0 0  Altered sleeping - - -  Tired, decreased energy - - -  Change in appetite - - -  Feeling bad or failure about yourself  - - -  Trouble concentrating - - -  Moving slowly or fidgety/restless - - -  Suicidal thoughts - - -  PHQ-9 Score - - -  Difficult doing work/chores - - -  Some recent data might be hidden    Social History   Tobacco Use  Smoking Status Never Smoker  Smokeless Tobacco Never Used   BP Readings from Last 3 Encounters:  03/19/21 140/70  03/15/21 140/90  02/08/21 (!) 160/92   Pulse Readings from Last 3 Encounters:  03/19/21 77  03/15/21 77  02/08/21 89   Wt Readings from Last 3 Encounters:  03/15/21 218 lb 9.6 oz (99.2 kg)  02/08/21 217 lb 9.6 oz (98.7 kg)  08/17/20 210 lb (95.3 kg)   BMI Readings from Last 3 Encounters:  03/15/21 44.15 kg/m  02/08/21 43.95 kg/m  08/17/20 42.41 kg/m     Assessment/Interventions: Review of patient past medical history, allergies, medications, health status, including review of consultants reports, laboratory and other test data, was performed as part of comprehensive evaluation and provision of chronic care management services.   SDOH:  (Social Determinants of Health) assessments and interventions performed: Yes   CCM Care Plan  Allergies  Allergen Reactions  . Tolterodine Other (See Comments)    dizziness  . Iohexol Hives     Code: HIVES, Desc: ? Contrast reaction from CT prior to 2000.   . Naproxen Itching and Rash  . Neomycin-Bacitracin Zn-Polymyx Itching and Rash    REACTION: redness  . Sulfonamide Derivatives Itching and Rash    REACTION: rash/tingling    Medications Reviewed Today    Reviewed by Charlton Haws, [facility] (Pharmacist) on 04/15/21 at 1533  Med List Status: <None>   Medication Order Taking? Sig Documenting Provider Last Dose Status Informant  Calcium Carbonate-Vitamin D 600-400 MG-UNIT tablet [id number] Yes Take 1 tablet by mouth 2 (two) times daily. [provider] Taking Active Self  celecoxib (CELEBREX) 200 MG capsule [id number] Yes Take 200 mg by mouth in the morning and at bedtime. [provider] Taking Active   cyclobenzaprine (FLEXERIL) 10 MG tablet [id number] Yes Take 1 tablet (10 mg total) by mouth 2 (two) times daily as needed for muscle spasms. Lennice Sites, DO Taking Active   diclofenac Sodium (VOLTAREN) 1 % GEL [id number]  Yes Apply topically as needed (apply to arthritic areas). [provider] Taking Active Self  gabapentin (NEURONTIN) 300 MG capsule [id number] Yes Take 1 capsule (300 mg total) by mouth 2 (two) times daily. Hoyt Koch, MD Taking Active   HYDROcodone-acetaminophen [facility]) 10-325 MG tablet [id number] Yes Take 1 tablet by mouth 4 (four) times daily as needed. [provider] Taking Active   ibuprofen (ADVIL) 200 MG tablet [id number] Yes Take 600 mg by mouth at bedtime. [provider] Taking Active Self  irbesartan (AVAPRO) 300 MG tablet [id number] Yes TAKE ONE TABLET BY MOUTH DAILY Janith Lima, MD Taking Active   methylPREDNISolone (MEDROL DOSEPAK) 4 MG TBPK tablet [id number] Yes Follow package insert Lennice Sites, DO Taking Active   Multiple Vitamins-Minerals (MULTIVITAMIN WITH MINERALS) tablet [id number] Yes Take 1 tablet by mouth daily. [provider] Taking Active Self  Multiple Vitamins-Minerals (PRESERVISION AREDS 2 PO) [id number] Yes Take 1 capsule by mouth 2 (two) times daily. [provider] Taking Active Self  omeprazole (PRILOSEC) 40 MG capsule [id number] Yes TAKE ONE CAPSULE BY MOUTH DAILY Nandigam, Venia Minks, MD Taking Active   PARoxetine (PAXIL) 10 MG tablet [id number] Yes TAKE ONE TABLET BY MOUTH DAILY Janith Lima, MD Taking Active   Propylene  Glycol (SYSTANE BALANCE) 0.6 % SOLN [id number] Yes Place 1 drop into both eyes 3 (three) times daily as needed (dry eyes). [provider] Taking Active Self  rosuvastatin (CRESTOR) 20 MG tablet [id number] Yes Take 1 tablet (20 mg total) by mouth daily. Janith Lima, MD Taking Active   solifenacin (VESICARE) 10 MG tablet [id number] Yes Take 1 tablet (10 mg total) by mouth daily. Janith Lima, MD Taking Active   traMADol Veatrice Bourbon) 50 MG tablet [id number] Yes Take 50 mg by mouth 3 (three) times daily as needed. [provider] Taking Active   TURMERIC PO [id number] Yes Take 1,000 mg by mouth daily. Take one by mouth daily [provider] Taking Active Self          Patient Active Problem List   Diagnosis Date Noted  . Leg swelling 03/15/2021  . Jet lag syndrome 05/16/2020  . Status post total right knee replacement 08/17/2019  . OAB (overactive bladder) 06/22/2019  . ILD (interstitial lung disease) ([place]) 12/15/2018  . Enlarged heart 06/16/2018  . Morbid obesity ([place]) 10/17/2017  . 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 Hot flashes, menopausal 12/31/2016  . DDD (degenerative disc disease), lumbar 11/06/2015  . Right-sided low back pain without sciatica 06/21/2015  . Diastolic dysfunction without heart failure 11/13/2014  . Other abnormal glucose 05/27/2013  . Osteopenia of the elderly 05/27/2013  . Hyperlipidemia LDL goal <130 09/04/2012  . Esophageal stricture 09/12/2011  . Visit for screening mammogram 08/08/2011  . Routine general medical examination at a health care facility 08/08/2011  . Essential hypertension, benign 01/14/2011  . URINARY INCONTINENCE 12/14/2008  . OA (osteoarthritis) 07/21/2008  . Obstructive sleep apnea 10/14/2007  . GERD 10/14/2007    Immunization History  Administered Date(s) Administered  . Fluad Quad(high Dose 65+) 09/10/2019, 08/17/2020  . Influenza Split 09/04/2012  . Influenza Whole 08/30/2009, 09/18/2010, 08/08/2011   . Influenza, High Dose Seasonal PF 08/30/2014, 09/02/2016, 09/11/2017, 09/22/2018  . Influenza,inj,Quad PF,6+ Mos 09/02/2013, 09/01/2015  . Pneumococcal Conjugate-13 01/01/2018  . Pneumococcal Polysaccharide-23 05/19/2019  . Td 04/06/2010  . Tdap 07/19/2019  . Zoster 09/04/2012  . Zoster Recombinat (Shingrix) 12/11/2019, 03/10/2020    Conditions to be addressed/monitored:  Hypertension, Hyperlipidemia, Overactive Bladder  and Back pain  Patient Care Plan: CCM Pharmacy Care Plan    Problem Identified: Hypertension, Hyperlipidemia, Overactive Bladder and Back pain   Priority: High    Long-Range Goal: Disease management   Start Date: 01/25/2021  Expected End Date: 07/25/2021  This Visit's Progress: On track  Recent Progress: On track  Priority: High  Note:   Current Barriers:  . Unable to independently monitor therapeutic efficacy . Unable to maintain control of back pain  Pharmacist Clinical Goal(s):  Marland Kitchen Patient will achieve adherence to monitoring guidelines and medication adherence to achieve therapeutic efficacy . maintain control of back pain as evidenced by patient report  through collaboration with PharmD and provider.   Interventions: . 1:1 collaboration with Janith Lima, MD regarding development and update of comprehensive plan of care as evidenced by provider attestation and co-signature . Inter-disciplinary care team collaboration (see longitudinal plan of care) . Comprehensive medication review performed; medication list updated in electronic medical record  Hypertension (BP goal < 130/80) Uncontrolled - per patient home BP is 140s-150s/80s, and increases during pain flares Current regimen:  ? Irbesartan 300 mg daily Interventions: ? Discussed BP goals and benefits of medications for prevention of heart attack / stroke ? Advised to contact pain management for further recommendations ? Recommend to continue current medication   Hyperlipidemia (LDL goal <  100) Controlled  Current regimen:  ? Rosuvastatin 20 mg daily Interventions: ? Discussed cholesterol goals and benefits of medications for prevention of heart attack / stroke ? Recommend to continue current medication  Overactive bladder Controlled - per patient report Current regimen:  o Solifenacin 10 mg daily  Previously tried/failed: tolterodine, Gemtesa (cost), Myrbetriq Interventions: o Discussed benefits of medication o Recommend to continue current medication  Back pain Uncontrolled - pt reports she had back injection last week and it has not helped Current regimen:  o Tramadol 50 mg q6h prn o Docusate 100 mg daily o Ibuprofen 200 mg PRN - taking 3 pills at night o Celecoxib 200 mg BID Interventions: o Discussed ibuprofen and celecoxib are similar and not to be used at the same time o Cautioned overuse of NSAIDs - can increase BP, damage kidneys, and cause GI bleeding o Advised to use Tylenol up to 3000 mg/day in place of ibuprofen  Patient Goals/Self-Care Activities . Patient will:  - take medications as prescribed focus on medication adherence by pill box  Follow Up Plan: Telephone follow up appointment with care management team member scheduled for: 3 months     Medication Assistance: None required.  Patient affirms current coverage meets needs.  Patient's preferred pharmacy is:  [facility] [address] [place], [address] [place] [address] Phone: [phone] Fax: [place], [place] - [address] [place] [address] [place] [address] Phone: [phone] Fax: [phone]  Uses pill box? Yes Pt endorses 100% compliance  We discussed: Current pharmacy is preferred with insurance plan and patient is satisfied with pharmacy services Patient decided to: Continue current medication management strategy  Care Plan and Follow Up Patient Decision:  Patient agrees to Care Plan and  Follow-up.  Plan: Telephone follow up appointment with care management team member scheduled for:  3 months  Charlene Brooke, PharmD, [place], CPP Clinical Pharmacist [facility] at [facility] [phone]

## 2021-04-15 NOTE — Patient Instructions (Signed)
Visit Information  Phone number for Pharmacist: [phone]  Goals Addressed            This Visit's Progress   . Manage My Medicine       Timeframe:  Long-Range Goal Priority:  Medium Start Date:     04/10/21                        Expected End Date:   10/31/21                    Follow Up Date 07/31/21   - call for medicine refill 2 or 3 days before it runs out - call if I am sick and can't take my medicine - keep a list of all the medicines I take; vitamins and herbals too - use a pillbox to sort medicine  -Avoid using ibuprofen while taking celecoxib. May use Tylenol up to 3000 mg/day   Why is this important?   . These steps will help you keep on track with your medicines.   Notes:        The patient verbalized understanding of instructions, educational materials, and care plan provided today and declined offer to receive copy of patient instructions, educational materials, and care plan.  Telephone follow up appointment with pharmacy team member scheduled for: 3 months  Charlene Brooke, PharmD, [place], CPP Clinical Pharmacist [facility] at [facility] [phone]

## 2021-04-25 NOTE — Progress Notes (Signed)
Surgical Instructions    Your procedure is scheduled on 05/02/21.  Report to [facility] Main Entrance "A" at 06:30 A.M., then check in with the Admitting office.  Call this number if you have problems the morning of surgery:  [phone]   If you have any questions prior to your surgery date call [phone]: Open Monday-Friday 8am-4pm    Remember:  Do not eat or drink after midnight the night before your surgery     Take these medicines the morning of surgery with A SIP OF WATER  acetaminophen (TYLENOL) if needed gabapentin (NEURONTIN)  omeprazole (PRILOSEC)  Eye drops rosuvastatin (CRESTOR) solifenacin (VESICARE)   As of today, STOP taking any Aspirin (unless otherwise instructed by your surgeon) Aleve, Naproxen, Ibuprofen, Motrin, Advil, Goody's, BC's, all herbal medications, fish oil, celecoxib (CELEBREX) and all vitamins.                     Do not wear jewelry, make up, or nail polish            Do not wear lotions, powders, perfumes, or deodorant.            Do not shave 48 hours prior to surgery.              Do not bring valuables to the hospital.            [facility] is not responsible for any belongings or valuables.  Do NOT Smoke (Tobacco/Vaping) or drink Alcohol 24 hours prior to your procedure If you use a CPAP at night, you may bring all equipment for your overnight stay.   Contacts, glasses, dentures or bridgework may not be worn into surgery, please bring cases for these belongings   For patients admitted to the hospital, discharge time will be determined by your treatment team.   Patients discharged the day of surgery will not be allowed to drive home, and someone needs to stay with them for 24 hours.    Special instructions:    Oral Hygiene is also important to reduce your risk of infection.  Remember - BRUSH YOUR TEETH THE MORNING OF SURGERY WITH YOUR REGULAR TOOTHPASTE   [place]- Preparing For Surgery  Before surgery, you can play an  important role. Because skin is not sterile, your skin needs to be as free of germs as possible. You can reduce the number of germs on your skin by washing with CHG (chlorahexidine gluconate) Soap before surgery.  CHG is an antiseptic cleaner which kills germs and bonds with the skin to continue killing germs even after washing.     Please do not use if you have an allergy to CHG or antibacterial soaps. If your skin becomes reddened/irritated stop using the CHG.  Do not shave (including legs and underarms) for at least 48 hours prior to first CHG shower. It is OK to shave your face.  Please follow these instructions carefully.    1.  Shower the NIGHT BEFORE SURGERY and the MORNING OF SURGERY with CHG Soap.   If you chose to wash your hair, wash your hair first as usual with your normal shampoo. After you shampoo, rinse your hair and body thoroughly to remove the shampoo.  Then [employer] and genitals (private parts) with your normal soap and rinse thoroughly to remove soap.  2. After that Use CHG Soap as you would any other liquid soap. You can apply CHG directly to the skin and wash gently with a  scrungie or a clean washcloth.   3. Apply the CHG Soap to your body ONLY FROM THE NECK DOWN.  Do not use on open wounds or open sores. Avoid contact with your eyes, ears, mouth and genitals (private parts). Wash Face and genitals (private parts)  with your normal soap.   4. Wash thoroughly, paying special attention to the area where your surgery will be performed.  5. Thoroughly rinse your body with warm water from the neck down.  6. DO NOT shower/wash with your normal soap after using and rinsing off the CHG Soap.  7. Pat yourself dry with a CLEAN TOWEL.  8. Wear CLEAN PAJAMAS to bed the night before surgery  9. Place CLEAN SHEETS on your bed the night before your surgery  10. DO NOT SLEEP WITH PETS.   Day of Surgery: Take a shower with CHG soap. Wear Clean/Comfortable clothing the morning  of surgery Do not apply any deodorants/lotions.   Remember to brush your teeth WITH YOUR REGULAR TOOTHPASTE.   Please read over the following fact sheets that you were given.

## 2021-04-26 ENCOUNTER — Other Ambulatory Visit: Payer: Self-pay

## 2021-04-26 ENCOUNTER — Ambulatory Visit (HOSPITAL_COMMUNITY)
Admission: RE | Admit: 2021-04-26 | Discharge: 2021-04-26 | Disposition: A | Discharge status: Home / Self Care | Payer: Medicare Other | Source: Ambulatory Visit | Attending: Neurological Surgery | Admitting: Neurological Surgery

## 2021-04-26 ENCOUNTER — Encounter (HOSPITAL_COMMUNITY): Payer: Self-pay

## 2021-04-26 ENCOUNTER — Encounter (HOSPITAL_COMMUNITY)
Admission: RE | Admit: 2021-04-26 | Discharge: 2021-04-26 | Disposition: A | Discharge status: Home / Self Care | Payer: Medicare Other | Source: Ambulatory Visit | Attending: Neurological Surgery | Admitting: Neurological Surgery

## 2021-04-26 DIAGNOSIS — M48061 Spinal stenosis, lumbar region without neurogenic claudication: Secondary | ICD-10-CM | POA: Diagnosis not present

## 2021-04-26 DIAGNOSIS — I517 Cardiomegaly: Secondary | ICD-10-CM | POA: Insufficient documentation

## 2021-04-26 DIAGNOSIS — Z01818 Encounter for other preprocedural examination: Secondary | ICD-10-CM | POA: Insufficient documentation

## 2021-04-26 HISTORY — DX: Personal history of other diseases of the digestive system: Z87.19

## 2021-04-26 LAB — CBC WITH DIFFERENTIAL/PLATELET
Abs Immature Granulocytes: 0.02 10*3/uL (ref 0.00–0.07)
Basophils Absolute: 0 10*3/uL (ref 0.0–0.1)
Basophils Relative: 0 %
Eosinophils Absolute: 0.1 10*3/uL (ref 0.0–0.5)
Eosinophils Relative: 1 %
HCT: 34.3 % — ABNORMAL LOW (ref 36.0–46.0)
Hemoglobin: 11.4 g/dL — ABNORMAL LOW (ref 12.0–15.0)
Immature Granulocytes: 0 %
Lymphocytes Relative: 15 %
Lymphs Abs: 1.6 10*3/uL (ref 0.7–4.0)
MCH: 31.8 pg (ref 26.0–34.0)
MCHC: 33.2 g/dL (ref 30.0–36.0)
MCV: 95.5 fL (ref 80.0–100.0)
Monocytes Absolute: 1.2 10*3/uL — ABNORMAL HIGH (ref 0.1–1.0)
Monocytes Relative: 11 %
Neutro Abs: 7.5 10*3/uL (ref 1.7–7.7)
Neutrophils Relative %: 73 %
Platelets: 174 10*3/uL (ref 150–400)
RBC: 3.59 MIL/uL — ABNORMAL LOW (ref 3.87–5.11)
RDW: 13.7 % (ref 11.5–15.5)
WBC: 10.3 10*3/uL (ref 4.0–10.5)
nRBC: 0 % (ref 0.0–0.2)

## 2021-04-26 LAB — BASIC METABOLIC PANEL
Anion gap: 7 (ref 5–15)
BUN: 16 mg/dL (ref 8–23)
CO2: 27 mmol/L (ref 22–32)
Calcium: 8.8 mg/dL — ABNORMAL LOW (ref 8.9–10.3)
Chloride: 103 mmol/L (ref 98–111)
Creatinine, Ser: 0.8 mg/dL (ref 0.44–1.00)
GFR, Estimated: >60 mL/min (ref >60)
Glucose, Bld: 123 mg/dL — ABNORMAL HIGH (ref 70–99)
Potassium: 3.5 mmol/L (ref 3.5–5.1)
Sodium: 137 mmol/L (ref 135–145)

## 2021-04-26 LAB — SURGICAL PCR SCREEN
MRSA, PCR: NEGATIVE
Staphylococcus aureus: NEGATIVE

## 2021-04-26 LAB — PROTIME-INR
INR: 1.1 (ref 0.8–1.2)
Prothrombin Time: 14.1 seconds (ref 11.4–15.2)

## 2021-04-26 NOTE — Progress Notes (Signed)
PCP - Scarlette Calico Cardiologist - denies  PPM/ICD - denies  Chest x-ray - 04/26/21 EKG - 04/26/21 Stress Test - denies ECHO - 03/21/21 Cardiac Cath - denies  Sleep Study - yes CPAP - wears nightly/settings are at 7  No diabetes  As of today, STOP taking any Aspirin (unless otherwise instructed by your surgeon) Aleve, Naproxen, Ibuprofen, Motrin, Advil, Goody's, BC's, all herbal medications, fish oil, celecoxib (CELEBREX) and all vitamins.  ERAS Protcol -no   COVID TEST- 05/01/21 scheduled at drive thru   Anesthesia review: no  Patient denies shortness of breath, fever, cough and chest pain at PAT appointment   All instructions explained to the patient, with a verbal understanding of the material. Patient agrees to go over the instructions while at home for a better understanding. Patient also instructed to self quarantine after being tested for COVID-19. The opportunity to ask questions was provided.

## 2021-05-01 ENCOUNTER — Other Ambulatory Visit (HOSPITAL_COMMUNITY)
Admission: RE | Admit: 2021-05-01 | Discharge: 2021-05-01 | Disposition: A | Discharge status: Home / Self Care | Payer: Medicare Other | Source: Ambulatory Visit | Attending: Neurological Surgery | Admitting: Neurological Surgery

## 2021-05-01 DIAGNOSIS — Z01812 Encounter for preprocedural laboratory examination: Secondary | ICD-10-CM | POA: Diagnosis not present

## 2021-05-01 DIAGNOSIS — Z20822 Contact with and (suspected) exposure to covid-19: Secondary | ICD-10-CM | POA: Insufficient documentation

## 2021-05-01 LAB — SARS CORONAVIRUS 2 (TAT 6-24 HRS): SARS Coronavirus 2: NEGATIVE

## 2021-05-02 ENCOUNTER — Ambulatory Visit (HOSPITAL_COMMUNITY): Payer: Medicare Other | Admitting: Certified Registered Nurse Anesthetist

## 2021-05-02 ENCOUNTER — Ambulatory Visit (HOSPITAL_COMMUNITY)
Admission: RE | Admit: 2021-05-02 | Discharge: 2021-05-03 | Disposition: A | Discharge status: Home / Self Care | Payer: Medicare Other | Attending: Neurological Surgery | Admitting: Neurological Surgery

## 2021-05-02 ENCOUNTER — Encounter (HOSPITAL_COMMUNITY): Payer: Self-pay | Admitting: Neurological Surgery

## 2021-05-02 ENCOUNTER — Encounter (HOSPITAL_COMMUNITY)
Admission: RE | Disposition: A | Discharge status: Home / Self Care | Payer: Self-pay | Source: Home / Self Care | Attending: Neurological Surgery

## 2021-05-02 ENCOUNTER — Ambulatory Visit (HOSPITAL_COMMUNITY): Payer: Medicare Other

## 2021-05-02 ENCOUNTER — Other Ambulatory Visit: Payer: Self-pay

## 2021-05-02 DIAGNOSIS — Z96611 Presence of right artificial shoulder joint: Secondary | ICD-10-CM | POA: Diagnosis not present

## 2021-05-02 DIAGNOSIS — M4316 Spondylolisthesis, lumbar region: Secondary | ICD-10-CM | POA: Diagnosis not present

## 2021-05-02 DIAGNOSIS — Z96612 Presence of left artificial shoulder joint: Secondary | ICD-10-CM | POA: Diagnosis not present

## 2021-05-02 DIAGNOSIS — Z9889 Other specified postprocedural states: Secondary | ICD-10-CM | POA: Diagnosis present

## 2021-05-02 DIAGNOSIS — Z791 Long term (current) use of non-steroidal anti-inflammatories (NSAID): Secondary | ICD-10-CM | POA: Insufficient documentation

## 2021-05-02 DIAGNOSIS — I119 Hypertensive heart disease without heart failure: Secondary | ICD-10-CM | POA: Insufficient documentation

## 2021-05-02 DIAGNOSIS — Z888 Allergy status to other drugs, medicaments and biological substances status: Secondary | ICD-10-CM | POA: Insufficient documentation

## 2021-05-02 DIAGNOSIS — Z79899 Other long term (current) drug therapy: Secondary | ICD-10-CM | POA: Diagnosis not present

## 2021-05-02 DIAGNOSIS — Z882 Allergy status to sulfonamides status: Secondary | ICD-10-CM | POA: Insufficient documentation

## 2021-05-02 DIAGNOSIS — M5116 Intervertebral disc disorders with radiculopathy, lumbar region: Secondary | ICD-10-CM | POA: Insufficient documentation

## 2021-05-02 DIAGNOSIS — N3281 Overactive bladder: Secondary | ICD-10-CM | POA: Diagnosis not present

## 2021-05-02 DIAGNOSIS — Z419 Encounter for procedure for purposes other than remedying health state, unspecified: Secondary | ICD-10-CM

## 2021-05-02 DIAGNOSIS — Z886 Allergy status to analgesic agent status: Secondary | ICD-10-CM | POA: Insufficient documentation

## 2021-05-02 DIAGNOSIS — G4733 Obstructive sleep apnea (adult) (pediatric): Secondary | ICD-10-CM | POA: Diagnosis not present

## 2021-05-02 DIAGNOSIS — Z96653 Presence of artificial knee joint, bilateral: Secondary | ICD-10-CM | POA: Diagnosis not present

## 2021-05-02 DIAGNOSIS — M48061 Spinal stenosis, lumbar region without neurogenic claudication: Secondary | ICD-10-CM | POA: Insufficient documentation

## 2021-05-02 DIAGNOSIS — Z9989 Dependence on other enabling machines and devices: Secondary | ICD-10-CM | POA: Diagnosis not present

## 2021-05-02 DIAGNOSIS — Z85828 Personal history of other malignant neoplasm of skin: Secondary | ICD-10-CM | POA: Diagnosis not present

## 2021-05-02 HISTORY — PX: LUMBAR LAMINECTOMY/DECOMPRESSION MICRODISCECTOMY: SHX5026

## 2021-05-02 SURGERY — LUMBAR LAMINECTOMY/DECOMPRESSION MICRODISCECTOMY 1 LEVEL
Anesthesia: General | Site: Back | Laterality: Left

## 2021-05-02 MED ORDER — CHLORHEXIDINE GLUCONATE CLOTH 2 % EX PADS: 6.0000 | MEDICATED_PAD | Freq: Once | CUTANEOUS | Status: DC

## 2021-05-02 MED ORDER — PHENOL 1.4 % MT LIQD: 1.0000 | OROMUCOSAL | Status: DC | PRN

## 2021-05-02 MED ORDER — PROPOFOL 10 MG/ML IV BOLUS
INTRAVENOUS | Status: AC
  Filled 2021-05-02: qty 20

## 2021-05-02 MED ORDER — SENNA 8.6 MG PO TABS
1.0000 | ORAL_TABLET | Freq: Two times a day (BID) | ORAL | Status: DC
  Administered 2021-05-02 – 2021-05-03 (×2): 8.6 mg via ORAL
  Filled 2021-05-02 (×2): qty 1

## 2021-05-02 MED ORDER — CELECOXIB 200 MG PO CAPS
200.0000 mg | ORAL_CAPSULE | Freq: Two times a day (BID) | ORAL | Status: DC
  Administered 2021-05-02 – 2021-05-03 (×2): 200 mg via ORAL
  Filled 2021-05-02 (×2): qty 1

## 2021-05-02 MED ORDER — THROMBIN 5000 UNITS EX SOLR
OROMUCOSAL | Status: DC | PRN
  Administered 2021-05-02: 5 mL via TOPICAL

## 2021-05-02 MED ORDER — DARIFENACIN HYDROBROMIDE ER 7.5 MG PO TB24
7.5000 mg | ORAL_TABLET | Freq: Every day | ORAL | Status: DC
  Administered 2021-05-03: 7.5 mg via ORAL
  Filled 2021-05-02: qty 1

## 2021-05-02 MED ORDER — BUPIVACAINE HCL (PF) 0.25 % IJ SOLN
INTRAMUSCULAR | Status: AC
  Filled 2021-05-02: qty 30

## 2021-05-02 MED ORDER — ACETAMINOPHEN 325 MG PO TABS: 650.0000 mg | ORAL_TABLET | ORAL | Status: DC | PRN

## 2021-05-02 MED ORDER — BUPIVACAINE HCL (PF) 0.25 % IJ SOLN
INTRAMUSCULAR | Status: DC | PRN
  Administered 2021-05-02: 6 mL

## 2021-05-02 MED ORDER — IRBESARTAN 150 MG PO TABS
300.0000 mg | ORAL_TABLET | Freq: Every evening | ORAL | Status: DC
  Administered 2021-05-02: 300 mg via ORAL
  Filled 2021-05-02: qty 2

## 2021-05-02 MED ORDER — ONDANSETRON HCL 4 MG/2ML IJ SOLN: 4.0000 mg | Freq: Once | INTRAMUSCULAR | Status: DC | PRN

## 2021-05-02 MED ORDER — CHLORHEXIDINE GLUCONATE 0.12 % MT SOLN
15.0000 mL | Freq: Once | OROMUCOSAL | Status: AC
  Administered 2021-05-02: 15 mL via OROMUCOSAL
  Filled 2021-05-02: qty 15

## 2021-05-02 MED ORDER — FENTANYL CITRATE (PF) 100 MCG/2ML IJ SOLN
INTRAMUSCULAR | Status: AC
  Administered 2021-05-02: 25 ug via INTRAVENOUS
  Filled 2021-05-02: qty 2

## 2021-05-02 MED ORDER — LIDOCAINE 2% (20 MG/ML) 5 ML SYRINGE
INTRAMUSCULAR | Status: DC | PRN
  Administered 2021-05-02: 60 mg via INTRAVENOUS

## 2021-05-02 MED ORDER — ONDANSETRON HCL 4 MG/2ML IJ SOLN: 4.0000 mg | Freq: Four times a day (QID) | INTRAMUSCULAR | Status: DC | PRN

## 2021-05-02 MED ORDER — 0.9 % SODIUM CHLORIDE (POUR BTL) OPTIME
TOPICAL | Status: DC | PRN
  Administered 2021-05-02: 1000 mL

## 2021-05-02 MED ORDER — METHOCARBAMOL 1000 MG/10ML IJ SOLN
500.0000 mg | Freq: Four times a day (QID) | INTRAMUSCULAR | Status: DC | PRN
  Filled 2021-05-02: qty 5

## 2021-05-02 MED ORDER — ACETAMINOPHEN 500 MG PO TABS
1000.0000 mg | ORAL_TABLET | ORAL | Status: AC
  Administered 2021-05-02: 1000 mg via ORAL
  Filled 2021-05-02: qty 2

## 2021-05-02 MED ORDER — ORAL CARE MOUTH RINSE: 15.0000 mL | Freq: Once | OROMUCOSAL | Status: AC

## 2021-05-02 MED ORDER — MENTHOL 3 MG MT LOZG: 1.0000 | LOZENGE | OROMUCOSAL | Status: DC | PRN

## 2021-05-02 MED ORDER — ONDANSETRON HCL 4 MG/2ML IJ SOLN
INTRAMUSCULAR | Status: AC
  Filled 2021-05-02: qty 2

## 2021-05-02 MED ORDER — PROPOFOL 10 MG/ML IV BOLUS
INTRAVENOUS | Status: DC | PRN
  Administered 2021-05-02: 150 mg via INTRAVENOUS

## 2021-05-02 MED ORDER — CEFAZOLIN SODIUM-DEXTROSE 2-4 GM/100ML-% IV SOLN
2.0000 g | Freq: Three times a day (TID) | INTRAVENOUS | Status: AC
  Administered 2021-05-02 – 2021-05-03 (×2): 2 g via INTRAVENOUS
  Filled 2021-05-02 (×2): qty 100

## 2021-05-02 MED ORDER — DEXAMETHASONE SODIUM PHOSPHATE 10 MG/ML IJ SOLN
INTRAMUSCULAR | Status: AC
  Filled 2021-05-02: qty 1

## 2021-05-02 MED ORDER — FENTANYL CITRATE (PF) 250 MCG/5ML IJ SOLN
INTRAMUSCULAR | Status: AC
  Filled 2021-05-02: qty 5

## 2021-05-02 MED ORDER — LACTATED RINGERS IV SOLN: INTRAVENOUS | Status: DC

## 2021-05-02 MED ORDER — SODIUM CHLORIDE 0.9% FLUSH: 3.0000 mL | INTRAVENOUS | Status: DC | PRN

## 2021-05-02 MED ORDER — LIDOCAINE 2% (20 MG/ML) 5 ML SYRINGE
INTRAMUSCULAR | Status: AC
  Filled 2021-05-02: qty 5

## 2021-05-02 MED ORDER — HYDROCODONE-ACETAMINOPHEN 7.5-325 MG PO TABS
1.0000 | ORAL_TABLET | Freq: Four times a day (QID) | ORAL | Status: DC
  Administered 2021-05-02 – 2021-05-03 (×5): 1 via ORAL
  Filled 2021-05-02 (×5): qty 1

## 2021-05-02 MED ORDER — ACETAMINOPHEN 650 MG RE SUPP: 650.0000 mg | RECTAL | Status: DC | PRN

## 2021-05-02 MED ORDER — SODIUM CHLORIDE 0.9% FLUSH
3.0000 mL | Freq: Two times a day (BID) | INTRAVENOUS | Status: DC
  Administered 2021-05-02 (×2): 3 mL via INTRAVENOUS

## 2021-05-02 MED ORDER — METHOCARBAMOL 500 MG PO TABS
500.0000 mg | ORAL_TABLET | Freq: Four times a day (QID) | ORAL | Status: DC | PRN
  Administered 2021-05-02 – 2021-05-03 (×2): 500 mg via ORAL
  Filled 2021-05-02 (×2): qty 1

## 2021-05-02 MED ORDER — CEFAZOLIN SODIUM-DEXTROSE 2-4 GM/100ML-% IV SOLN
2.0000 g | INTRAVENOUS | Status: AC
  Administered 2021-05-02: 2 g via INTRAVENOUS
  Filled 2021-05-02: qty 100

## 2021-05-02 MED ORDER — ONDANSETRON HCL 4 MG PO TABS: 4.0000 mg | ORAL_TABLET | Freq: Four times a day (QID) | ORAL | Status: DC | PRN

## 2021-05-02 MED ORDER — DEXAMETHASONE SODIUM PHOSPHATE 10 MG/ML IJ SOLN
10.0000 mg | Freq: Once | INTRAMUSCULAR | Status: AC
  Administered 2021-05-02: 10 mg via INTRAVENOUS

## 2021-05-02 MED ORDER — GABAPENTIN 300 MG PO CAPS
300.0000 mg | ORAL_CAPSULE | Freq: Two times a day (BID) | ORAL | Status: DC
  Administered 2021-05-02 – 2021-05-03 (×2): 300 mg via ORAL
  Filled 2021-05-02 (×2): qty 1

## 2021-05-02 MED ORDER — PHENYLEPHRINE HCL-NACL 10-0.9 MG/250ML-% IV SOLN
INTRAVENOUS | Status: DC | PRN
  Administered 2021-05-02: 40 ug/min via INTRAVENOUS

## 2021-05-02 MED ORDER — DEXAMETHASONE 4 MG PO TABS
4.0000 mg | ORAL_TABLET | Freq: Four times a day (QID) | ORAL | Status: DC
  Administered 2021-05-02 – 2021-05-03 (×3): 4 mg via ORAL
  Filled 2021-05-02 (×3): qty 1

## 2021-05-02 MED ORDER — THROMBIN 5000 UNITS EX SOLR
CUTANEOUS | Status: AC
  Filled 2021-05-02: qty 15000

## 2021-05-02 MED ORDER — AMISULPRIDE (ANTIEMETIC) 5 MG/2ML IV SOLN: 10.0000 mg | Freq: Once | INTRAVENOUS | Status: DC | PRN

## 2021-05-02 MED ORDER — FENTANYL CITRATE (PF) 100 MCG/2ML IJ SOLN: 25.0000 ug | INTRAMUSCULAR | Status: DC | PRN

## 2021-05-02 MED ORDER — MORPHINE SULFATE (PF) 2 MG/ML IV SOLN
2.0000 mg | INTRAVENOUS | Status: DC | PRN
  Administered 2021-05-02: 2 mg via INTRAVENOUS
  Filled 2021-05-02: qty 1

## 2021-05-02 MED ORDER — LACTATED RINGERS IV SOLN: INTRAVENOUS | Status: DC | PRN

## 2021-05-02 MED ORDER — ROCURONIUM BROMIDE 10 MG/ML (PF) SYRINGE
PREFILLED_SYRINGE | INTRAVENOUS | Status: DC | PRN
  Administered 2021-05-02: 60 mg via INTRAVENOUS

## 2021-05-02 MED ORDER — GABAPENTIN 300 MG PO CAPS
300.0000 mg | ORAL_CAPSULE | ORAL | Status: AC
  Administered 2021-05-02: 300 mg via ORAL
  Filled 2021-05-02: qty 1

## 2021-05-02 MED ORDER — ROCURONIUM BROMIDE 10 MG/ML (PF) SYRINGE
PREFILLED_SYRINGE | INTRAVENOUS | Status: AC
  Filled 2021-05-02: qty 10

## 2021-05-02 MED ORDER — SODIUM CHLORIDE 0.9 % IV SOLN
250.0000 mL | INTRAVENOUS | Status: DC
  Administered 2021-05-02: 250 mL via INTRAVENOUS

## 2021-05-02 MED ORDER — PAROXETINE HCL 10 MG PO TABS
10.0000 mg | ORAL_TABLET | Freq: Every morning | ORAL | Status: DC
  Administered 2021-05-03: 10 mg via ORAL
  Filled 2021-05-02: qty 1

## 2021-05-02 MED ORDER — DEXAMETHASONE SODIUM PHOSPHATE 4 MG/ML IJ SOLN
4.0000 mg | Freq: Four times a day (QID) | INTRAMUSCULAR | Status: DC
  Administered 2021-05-02: 4 mg via INTRAVENOUS
  Filled 2021-05-02: qty 1

## 2021-05-02 MED ORDER — POTASSIUM CHLORIDE IN NACL 20-0.9 MEQ/L-% IV SOLN: INTRAVENOUS | Status: DC

## 2021-05-02 MED ORDER — FENTANYL CITRATE (PF) 250 MCG/5ML IJ SOLN
INTRAMUSCULAR | Status: DC | PRN
  Administered 2021-05-02: 100 ug via INTRAVENOUS
  Administered 2021-05-02: 50 ug via INTRAVENOUS

## 2021-05-02 MED ORDER — SUGAMMADEX SODIUM 200 MG/2ML IV SOLN
INTRAVENOUS | Status: DC | PRN
  Administered 2021-05-02: 200 mg via INTRAVENOUS

## 2021-05-02 MED ORDER — ONDANSETRON HCL 4 MG/2ML IJ SOLN
INTRAMUSCULAR | Status: DC | PRN
  Administered 2021-05-02: 4 mg via INTRAVENOUS

## 2021-05-02 SURGICAL SUPPLY — 43 items
APL SKNCLS STERI-STRIP NONHPOA (GAUZE/BANDAGES/DRESSINGS) ×1
BAND INSRT 18 STRL LF DISP RB (MISCELLANEOUS)
BAND RUBBER #18 3X1/16 STRL (MISCELLANEOUS) ×2 IMPLANT
BENZOIN TINCTURE PRP APPL 2/3 (GAUZE/BANDAGES/DRESSINGS) ×2 IMPLANT
BUR CARBIDE MATCH 3.0 (BURR) ×2 IMPLANT
CANISTER SUCT 3000ML PPV (MISCELLANEOUS) ×2 IMPLANT
COVER WAND RF STERILE (DRAPES) ×2 IMPLANT
DRAPE LAPAROTOMY 100X72X124 (DRAPES) ×2 IMPLANT
DRAPE MICROSCOPE LEICA (MISCELLANEOUS) ×1 IMPLANT
DRAPE SURG 17X23 STRL (DRAPES) ×2 IMPLANT
DRSG OPSITE POSTOP 3X4 (GAUZE/BANDAGES/DRESSINGS) ×1 IMPLANT
DURAPREP 26ML APPLICATOR (WOUND CARE) ×2 IMPLANT
ELECT REM PT RETURN 9FT ADLT (ELECTROSURGICAL) ×2
ELECTRODE REM PT RTRN 9FT ADLT (ELECTROSURGICAL) ×1 IMPLANT
GAUZE 4X4 16PLY RFD (DISPOSABLE) IMPLANT
GLOVE BIO SURGEON STRL SZ7 (GLOVE) IMPLANT
GLOVE BIO SURGEON STRL SZ8 (GLOVE) ×2 IMPLANT
GLOVE SURG UNDER POLY LF SZ7 (GLOVE) IMPLANT
GLOVE SURG UNDER POLY LF SZ7.5 (GLOVE) ×3 IMPLANT
GOWN STRL REUS W/ TWL LRG LVL3 (GOWN DISPOSABLE) IMPLANT
GOWN STRL REUS W/ TWL XL LVL3 (GOWN DISPOSABLE) ×1 IMPLANT
GOWN STRL REUS W/TWL 2XL LVL3 (GOWN DISPOSABLE) IMPLANT
GOWN STRL REUS W/TWL LRG LVL3 (GOWN DISPOSABLE) ×2
GOWN STRL REUS W/TWL XL LVL3 (GOWN DISPOSABLE) ×4
HEMOSTAT POWDER SURGIFOAM 1G (HEMOSTASIS) ×1 IMPLANT
KIT BASIN OR (CUSTOM PROCEDURE TRAY) ×2 IMPLANT
KIT TURNOVER KIT B (KITS) ×2 IMPLANT
NDL HYPO 25X1 1.5 SAFETY (NEEDLE) ×1 IMPLANT
NDL SPNL 20GX3.5 QUINCKE YW (NEEDLE) IMPLANT
NEEDLE HYPO 25X1 1.5 SAFETY (NEEDLE) ×2 IMPLANT
NEEDLE SPNL 20GX3.5 QUINCKE YW (NEEDLE) ×2 IMPLANT
NS IRRIG 1000ML POUR BTL (IV SOLUTION) ×2 IMPLANT
PACK LAMINECTOMY NEURO (CUSTOM PROCEDURE TRAY) ×2 IMPLANT
PAD ARMBOARD 7.5X6 YLW CONV (MISCELLANEOUS) ×6 IMPLANT
SPONGE SURGIFOAM ABS GEL SZ50 (HEMOSTASIS) IMPLANT
STRIP CLOSURE SKIN 1/2X4 (GAUZE/BANDAGES/DRESSINGS) ×2 IMPLANT
SUT VIC AB 0 CT1 18XCR BRD8 (SUTURE) ×1 IMPLANT
SUT VIC AB 0 CT1 8-18 (SUTURE) ×2
SUT VIC AB 2-0 CP2 18 (SUTURE) ×2 IMPLANT
SUT VIC AB 3-0 SH 8-18 (SUTURE) ×3 IMPLANT
TOWEL GREEN STERILE (TOWEL DISPOSABLE) ×2 IMPLANT
TOWEL GREEN STERILE FF (TOWEL DISPOSABLE) ×2 IMPLANT
WATER STERILE IRR 1000ML POUR (IV SOLUTION) ×2 IMPLANT

## 2021-05-02 NOTE — H&P (Signed)
Subjective: Patient is a 80 y.o. female admitted for L leg pain. Onset of symptoms was several months ago, gradually worsening since that time.  The pain is rated severe, and is located at the across the lower back and radiates to LLE. The pain is described as aching and occurs all day. The symptoms have been progressive. Symptoms are exacerbated by exercise and standing. MRI or CT showed stenosis   Past Medical History:  Diagnosis Date  . Anemia   . Arthritis   . BCC (basal cell carcinoma) 07/19/2014   Right cheek  . Cataract   . Complication of anesthesia    takes patient a long time to wake up  . GERD (gastroesophageal reflux disease)   . History of hiatal hernia   . Hx of shoulder replacement    lt  . Hypertension   . OSA (obstructive sleep apnea)    use CPAP  . Pneumonia    History of  . Urinary incontinence     Past Surgical History:  Procedure Laterality Date  . ABDOMINAL HYSTERECTOMY    . EYE SURGERY     bilateral cataract extraction  . FINGER SURGERY     cyst left middle finger  . JOINT REPLACEMENT     left shoulder  . NASAL SINUS SURGERY    . SHOULDER ARTHROSCOPY     Left  . TONSILLECTOMY     removed as a child  . TOTAL KNEE ARTHROPLASTY Right 08/17/2019   Procedure: TOTAL KNEE ARTHROPLASTY;  Surgeon: Paralee Cancel, MD;  Location: [facility];  Service: Orthopedics;  Laterality: Right;  70 mins  . TOTAL SHOULDER ARTHROPLASTY  10/10/2011   Procedure: TOTAL SHOULDER ARTHROPLASTY;  Surgeon: Metta Clines Supple;  Location: [place];  Service: Orthopedics;  Laterality: Right;  . UPPER GASTROINTESTINAL ENDOSCOPY    . Vaginal Sling      Prior to Admission medications   Medication Sig Start Date End Date Taking? Authorizing Provider  acetaminophen (TYLENOL) 650 MG CR tablet Take 650-1,300 mg by mouth every 8 (eight) hours as needed for pain.   Yes [provider]  Calcium Carb-Cholecalciferol (CALTRATE 600+D3 PO) Take 1 tablet by mouth in the morning.   Yes [provider]  Calcium Carbonate-Vitamin D 600-400 MG-UNIT tablet Take 1 tablet by mouth in the morning.   Yes [provider]  celecoxib (CELEBREX) 200 MG capsule Take 200 mg by mouth in the morning and at bedtime.   Yes [provider]  gabapentin (NEURONTIN) 300 MG capsule Take 1 capsule (300 mg total) by mouth 2 (two) times daily. 02/27/21  Yes Hoyt Koch, MD  irbesartan (AVAPRO) 300 MG tablet TAKE ONE TABLET BY MOUTH DAILY Patient taking differently: Take 300 mg by mouth every evening. 02/14/21  Yes Janith Lima, MD  Multiple Vitamins-Minerals (MULTIVITAMIN WITH MINERALS) tablet Take 1 tablet by mouth in the morning. Centrum   Yes [provider]  Multiple Vitamins-Minerals (PRESERVISION AREDS 2 PO) Take 1 capsule by mouth 2 (two) times daily.   Yes [provider]  omeprazole (PRILOSEC) 40 MG capsule TAKE ONE CAPSULE BY MOUTH DAILY Patient taking differently: Take 40 mg by mouth in the morning. 06/28/20  Yes Nandigam, Venia Minks, MD  PARoxetine (PAXIL) 10 MG tablet TAKE ONE TABLET BY MOUTH DAILY Patient taking differently: Take 10 mg by mouth in the morning. 02/15/21  Yes Janith Lima, MD  Propylene Glycol (SYSTANE BALANCE) 0.6 % SOLN Place 1 drop into both eyes 3 (three)  times daily as needed (dry eyes).   Yes [provider]  rosuvastatin (CRESTOR) 20 MG tablet Take 1 tablet (20 mg total) by mouth daily. Patient taking differently: Take 20 mg by mouth in the morning. 11/21/20  Yes Janith Lima, MD  solifenacin (VESICARE) 10 MG tablet Take 1 tablet (10 mg total) by mouth daily. Patient taking differently: Take 10 mg by mouth every evening. 03/16/21  Yes Janith Lima, MD  TURMERIC PO Take 1,000 mg by mouth in the morning.   Yes [provider]  cyclobenzaprine (FLEXERIL) 10 MG tablet Take 1 tablet (10 mg total) by mouth 2 (two) times daily as needed for muscle spasms. Patient not taking: Reported on 04/20/2021 03/19/21    Lennice Sites, DO  methylPREDNISolone (MEDROL DOSEPAK) 4 MG TBPK tablet Follow package insert Patient not taking: Reported on 04/20/2021 03/19/21   Lennice Sites, DO   Allergies  Allergen Reactions  . Tolterodine Other (See Comments)    dizziness  . Iohexol Hives     Code: HIVES, Desc: ? Contrast reaction from CT prior to 2000.   . Naproxen Itching and Rash  . Neomycin-Bacitracin Zn-Polymyx Itching and Rash    REACTION: redness  . Sulfonamide Derivatives Itching and Rash    tingling    Social History   Tobacco Use  . Smoking status: Never Smoker  . Smokeless tobacco: Never Used  Substance Use Topics  . Alcohol use: No    Family History  Problem Relation Age of Onset  . Cancer Father        Prostate cancer  . Cancer Sister        Ovarian cancer  . Diabetes Sister   . Alzheimer's disease Sister   . Alzheimer's disease Mother   . COPD Neg Hx   . Alcohol abuse Neg Hx   . Early death Neg Hx   . Heart disease Neg Hx   . Hyperlipidemia Neg Hx   . Hypertension Neg Hx   . Kidney disease Neg Hx   . Stroke Neg Hx      Review of Systems  Positive ROS: neg  All other systems have been reviewed and were otherwise negative with the exception of those mentioned in the HPI and as above.  Objective: Vital signs in last 24 hours: Temp:  [97.8 F (36.6 C)] 97.8 F (36.6 C) (06/01 0643) Pulse Rate:  [75] 75 (06/01 0643) Resp:  [20] 20 (06/01 0643) BP: (179-209)/(69-83) 179/69 (06/01 0723) SpO2:  [99 %] 99 % (06/01 0643) Weight:  [95.8 kg] 95.8 kg (06/01 0643)  General Appearance: Alert, cooperative, no distress, appears stated age Head: Normocephalic, without obvious abnormality, atraumatic Eyes: PERRL, conjunctiva/corneas clear, EOM's intact    Neck: Supple, symmetrical, trachea midline Back: Symmetric, no curvature, ROM normal, no CVA tenderness Lungs:  respirations unlabored Heart: Regular rate and rhythm Abdomen: Soft, non-tender Extremities: Extremities normal,  atraumatic, no cyanosis or edema Pulses: 2+ and symmetric all extremities Skin: Skin color, texture, turgor normal, no rashes or lesions  NEUROLOGIC:   Mental status: Alert and oriented x4,  no aphasia, good attention span, fund of knowledge, and memory Motor Exam - grossly normal Sensory Exam - grossly normal Reflexes: 1+ Coordination - grossly normal Gait - grossly normal Balance - grossly normal Cranial Nerves: I: smell Not tested  II: visual acuity  OS: nl    OD: nl  II: visual fields Full to confrontation  II: pupils Equal, round, reactive to light  III,VII: ptosis None  III,IV,VI: extraocular muscles  Full ROM  V: mastication Normal  V: facial light touch sensation  Normal  V,VII: corneal reflex  Present  VII: facial muscle function - upper  Normal  VII: facial muscle function - lower Normal  VIII: hearing Not tested  IX: soft palate elevation  Normal  IX,X: gag reflex Present  XI: trapezius strength  5/5  XI: sternocleidomastoid strength 5/5  XI: neck flexion strength  5/5  XII: tongue strength  Normal    Data Review Lab Results  Component Value Date   WBC 10.3 04/26/2021   HGB 11.4 (L) 04/26/2021   HCT 34.3 (L) 04/26/2021   MCV 95.5 04/26/2021   PLT 174 04/26/2021   Lab Results  Component Value Date   NA 137 04/26/2021   K 3.5 04/26/2021   CL 103 04/26/2021   CO2 27 04/26/2021   BUN 16 04/26/2021   CREATININE 0.80 04/26/2021   GLUCOSE 123 (H) 04/26/2021   Lab Results  Component Value Date   INR 1.1 04/26/2021    Assessment/Plan:  Estimated body mass index is 42.66 kg/m as calculated from the following:   Height as of this encounter: 4\' 11"  (1.499 m).   Weight as of this encounter: 95.8 kg. Patient admitted for L L4-5 laminectomy. Patient has failed a reasonable attempt at conservative therapy.  I explained the condition and procedure to the patient and answered any questions.  Patient wishes to proceed with procedure as planned. Understands  risks/ benefits and typical outcomes of procedure.   Eustace Moore 05/02/2021 8:20 AM

## 2021-05-02 NOTE — Anesthesia Preprocedure Evaluation (Addendum)
Anesthesia Evaluation  Patient identified by MRN, date of birth, ID band Patient awake    Reviewed: Allergy & Precautions, NPO status , Patient's Chart, lab work & pertinent test results  Airway Mallampati: IV  TM Distance: >3 FB Neck ROM: Full    Dental no notable dental hx.    Pulmonary sleep apnea and Continuous Positive Airway Pressure Ventilation ,    Pulmonary exam normal breath sounds clear to auscultation       Cardiovascular hypertension, Pt. on medications Normal cardiovascular exam Rhythm:Regular Rate:Normal  ECG: SR, rate 67  ECHO: 1. Left ventricular ejection fraction, by estimation, is 55 to 60%. Left ventricular ejection fraction by 3D volume is 57 %. The left ventricle has normal function. The left ventricle has no regional wall motion abnormalities. Left ventricular diastolic parameters are consistent with Grade I diastolic dysfunction (impaired relaxation). The average left ventricular global longitudinal strain is -22.6 %. The global longitudinal strain is normal. 2. Right ventricular systolic function is normal. The right ventricular size is normal. There is normal pulmonary artery systolic pressure. 3. Left atrial size was mildly dilated. 4. The mitral valve is normal in structure. Mild mitral valve regurgitation. No evidence of mitral stenosis. 5. The aortic valve is normal in structure. Aortic valve regurgitation is trivial. No aortic stenosis is present. 6. The inferior vena cava is normal in size with greater than 50% respiratory variability, suggesting right atrial pressure of 3 mmHg.   Neuro/Psych negative neurological ROS  negative psych ROS   GI/Hepatic Neg liver ROS, hiatal hernia, GERD  Medicated and Controlled,  Endo/Other  Morbid obesity  Renal/GU negative Renal ROS     Musculoskeletal  (+) Arthritis ,   Abdominal (+) + obese,   Peds  Hematology  (+) anemia , HLD   Anesthesia  Other Findings Lumbar Stenosis  Reproductive/Obstetrics                            Anesthesia Physical Anesthesia Plan  ASA: III  Anesthesia Plan: General   Post-op Pain Management:    Induction: Intravenous  PONV Risk Score and Plan: 3 and Ondansetron, Dexamethasone and Treatment may vary due to age or medical condition  Airway Management Planned: Oral ETT  Additional Equipment:   Intra-op Plan:   Post-operative Plan: Extubation in OR  Informed Consent: I have reviewed the patients History and Physical, chart, labs and discussed the procedure including the risks, benefits and alternatives for the proposed anesthesia with the patient or authorized representative who has indicated his/her understanding and acceptance.     Dental advisory given  Plan Discussed with: CRNA  Anesthesia Plan Comments:        Anesthesia Quick Evaluation

## 2021-05-02 NOTE — Progress Notes (Signed)
She's doing great, sharp pre-op pain gone, good strength, home in am

## 2021-05-02 NOTE — Anesthesia Postprocedure Evaluation (Signed)
Anesthesia Post Note  Patient: Veronica Garza  Procedure(s) Performed: Laminectomy and Foraminotomy - Lumbar four-Lumbar five - left (Left Back)     Patient location during evaluation: PACU Anesthesia Type: General Level of consciousness: awake Pain management: pain level controlled Vital Signs Assessment: post-procedure vital signs reviewed and stable Respiratory status: spontaneous breathing, nonlabored ventilation, respiratory function stable and patient connected to nasal cannula oxygen Cardiovascular status: blood pressure returned to baseline and stable Postop Assessment: no apparent nausea or vomiting Anesthetic complications: no   No complications documented.  Last Vitals:  Vitals:   05/02/21 1716 05/02/21 1933  BP: (!) 158/90 (!) 159/82  Pulse: (!) 101 94  Resp: 20 18  Temp: 36.7 C 36.8 C  SpO2: 100% 96%    Last Pain:  Vitals:   05/02/21 1933  TempSrc: Oral  PainSc:                  Marquelle Musgrave P Daryon Remmert

## 2021-05-02 NOTE — Anesthesia Procedure Notes (Signed)
Procedure Name: Intubation Date/Time: 05/02/2021 8:51 AM Performed by: Reece Agar, CRNA Pre-anesthesia Checklist: Patient identified, Emergency Drugs available, Suction available and Patient being monitored Patient Re-evaluated:Patient Re-evaluated prior to induction Oxygen Delivery Method: Circle System Utilized Preoxygenation: Pre-oxygenation with 100% oxygen Induction Type: IV induction Ventilation: Mask ventilation without difficulty Laryngoscope Size: Mac and 3 Grade View: Grade I Tube type: Oral Tube size: 7.0 mm Number of attempts: 1 Airway Equipment and Method: Stylet and Oral airway Placement Confirmation: ETT inserted through vocal cords under direct vision,  positive ETCO2 and breath sounds checked- equal and bilateral Secured at: 21 cm Tube secured with: Tape Dental Injury: Teeth and Oropharynx as per pre-operative assessment

## 2021-05-02 NOTE — Transfer of Care (Signed)
Immediate Anesthesia Transfer of Care Note  Patient: AALAYA YADAO  Procedure(s) Performed: Laminectomy and Foraminotomy - Lumbar four-Lumbar five - left (Left Back)  Patient Location: PACU  Anesthesia Type:General  Level of Consciousness: awake and alert   Airway & Oxygen Therapy: Patient Spontanous Breathing and Patient connected to face mask oxygen  Post-op Assessment: Report given to RN, Post -op Vital signs reviewed and stable and Patient moving all extremities X 4  Post vital signs: Reviewed and stable  Last Vitals:  Vitals Value Taken Time  BP 164/74 05/02/21 1027  Temp    Pulse 70 05/02/21 1026  Resp 14 05/02/21 1026  SpO2 100 % 05/02/21 1026  Vitals shown include unvalidated device data.  Last Pain:  Vitals:   05/02/21 0649  PainSc: 7       Patients Stated Pain Goal: 2 (91/91/66 0600)  Complications: No complications documented.

## 2021-05-02 NOTE — Op Note (Signed)
05/02/2021  10:20 AM  PATIENT:  Veronica Garza  80 y.o. female  PRE-OPERATIVE DIAGNOSIS: Lumbar spinal stenosis L4-5 with left lower extremity radiculopathy  POST-OPERATIVE DIAGNOSIS:  same  PROCEDURE: Left L4-5 hemilaminectomy medial facetectomy and foraminotomy  SURGEON:  Sherley Bounds, MD  ASSISTANTS: Dr. Marcello Moores  ANESTHESIA:   General  EBL: 25 ml  Total I/O In: 100 [IV Piggyback:100] Out: 25 [Blood:50]  BLOOD ADMINISTERED: none  DRAINS: None  SPECIMEN:  none  INDICATION FOR PROCEDURE: This patient presented with left leg pain consistent with radiculopathy. Imaging showed grade 1 spondylolisthesis L4-5 with severe spinal stenosis and midline disc herniation. The patient tried conservative measures without relief. Pain was debilitating. Recommended decompressive hemilaminectomy at L4-5 on the left. Patient understood the risks, benefits, and alternatives and potential outcomes and wished to proceed.  PROCEDURE DETAILS: The patient was taken to the operating room and after induction of adequate generalized endotracheal anesthesia, the patient was rolled into the prone position on the Wilson frame and all pressure points were padded. The lumbar region was cleaned and then prepped with DuraPrep and draped in the usual sterile fashion. 5 cc of local anesthesia was injected and then a dorsal midline incision was made and carried down to the lumbo sacral fascia. The fascia was opened and the paraspinous musculature was taken down in a subperiosteal fashion to expose L4-5 on the left. Intraoperative x-ray confirmed my level, and then I used a combination of the high-speed drill and the Kerrison punches to perform a hemilaminectomy, medial facetectomy, and foraminotomy at L4 5 on the left. The underlying yellow ligament was opened and removed in a piecemeal fashion to expose the underlying dura and exiting nerve root. I undercut the lateral recess and dissected down until I was medial to and  distal to the pedicle. The nerve root was well decompressed.  I also drilled up under the spinous process and performed a partial sublaminar decompression to decompress the midline.  We then gently retracted the nerve root medially with a retractor, coagulated the epidural venous vasculature, and inspected the disc space.  There was a subannular disc herniation but I did not feel the need to perform an annulotomy and discectomy as I thought this would further destabilize the segment.  I then palpated with a coronary dilator along the nerve root and into the foramen to assure adequate decompression. I felt no more compression of the nerve root. I irrigated with saline solution containing bacitracin. Achieved hemostasis with bipolar cautery,, and then closed the fascia with 0 Vicryl. I closed the subcutaneous tissues with 2-0 Vicryl and the subcuticular tissues with 3-0 Vicryl. The skin was then closed with benzoin and Steri-Strips. The drapes were removed, a sterile dressing was applied.  My nurse practitioner was involved in the exposure, safe retraction of the neural elements,  and Dr. Marcello Moores helped with the closure. the patient was awakened from general anesthesia and transferred to the recovery room in stable condition. At the end of the procedure all sponge, needle and instrument counts were correct.    PLAN OF CARE: Admit for overnight observation  PATIENT DISPOSITION:  PACU - hemodynamically stable.   Delay start of Pharmacological VTE agent (>24hrs) due to surgical blood loss or risk of bleeding:  yes

## 2021-05-03 ENCOUNTER — Encounter (HOSPITAL_COMMUNITY): Payer: Self-pay | Admitting: Neurological Surgery

## 2021-05-03 DIAGNOSIS — M5116 Intervertebral disc disorders with radiculopathy, lumbar region: Secondary | ICD-10-CM | POA: Diagnosis not present

## 2021-05-03 DIAGNOSIS — Z791 Long term (current) use of non-steroidal anti-inflammatories (NSAID): Secondary | ICD-10-CM | POA: Diagnosis not present

## 2021-05-03 DIAGNOSIS — M4316 Spondylolisthesis, lumbar region: Secondary | ICD-10-CM | POA: Diagnosis not present

## 2021-05-03 DIAGNOSIS — I119 Hypertensive heart disease without heart failure: Secondary | ICD-10-CM | POA: Diagnosis not present

## 2021-05-03 DIAGNOSIS — M48061 Spinal stenosis, lumbar region without neurogenic claudication: Secondary | ICD-10-CM | POA: Diagnosis not present

## 2021-05-03 DIAGNOSIS — Z79899 Other long term (current) drug therapy: Secondary | ICD-10-CM | POA: Diagnosis not present

## 2021-05-03 MED ORDER — METHOCARBAMOL 500 MG PO TABS: 500.0000 mg | ORAL_TABLET | Freq: Four times a day (QID) | ORAL | 0 refills | Status: DC

## 2021-05-03 MED ORDER — HYDROCODONE-ACETAMINOPHEN 5-325 MG PO TABS: 1.0000 | ORAL_TABLET | ORAL | 0 refills | Status: DC | PRN

## 2021-05-03 NOTE — Evaluation (Signed)
Physical Therapy Evaluation Patient Details Name: Veronica Garza MRN: [id number] DOB: 27-Jul-1941 Today's Date: 05/03/2021   History of Present Illness  Pt is a L4-L5 laminectomy/decompression on 05/02/2021. PMH significant for HTN, L shoulder replacement, R TKR.  Clinical Impression  Pt admitted with above diagnosis. At the time of PT eval, pt was able to demonstrate transfers and ambulation with gross min guard assist and RW for safety. Pt was educated on precautions, brace application/wearing schedule, appropriate activity progression, and car transfer. Pt currently with functional limitations due to the deficits listed below (see PT Problem List). Pt will benefit from skilled PT to increase their independence and safety with mobility to allow discharge to the venue listed below.      Follow Up Recommendations No PT follow up;Supervision for mobility/OOB    Equipment Recommendations  Rolling walker with 5" wheels (Youth size)    Recommendations for Other Services       Precautions / Restrictions Precautions Precautions: Fall;Back Precaution Booklet Issued: Yes (comment) Precaution Comments: Reviewed precautions with pt and husband. Pt cued for precautions during functional mobility. Required Braces or Orthoses:  (No brace needed order) Restrictions Weight Bearing Restrictions: No      Mobility  Bed Mobility Overal bed mobility: Needs Assistance Bed Mobility: Rolling;Sidelying to Sit Rolling: Modified independent (Device/Increase time) Sidelying to sit: Supervision       General bed mobility comments: VC's for improved technique. No assist required.    Transfers Overall transfer level: Needs assistance Equipment used: Rolling walker (2 wheeled) Transfers: Sit to/from Stand Sit to Stand: Min guard         General transfer comment: Close guard for safety as pt powered up to full standing.  Ambulation/Gait Ambulation/Gait assistance: Supervision;Min guard Gait  Distance (Feet): 375 Feet Assistive device: Rolling walker (2 wheeled) Gait Pattern/deviations: Step-through pattern;Decreased stride length;Trunk flexed Gait velocity: Decreased Gait velocity interpretation: <1.31 ft/sec, indicative of household ambulator General Gait Details: Slow and guarded. No assist required and no overt LOB noted.  Stairs Stairs: Yes Stairs assistance: Min guard Stair Management: One rail Right;Step to pattern;Forwards Number of Stairs: 1 (x2) General stair comments: VC's for sequencing and general safety.  Wheelchair Mobility    Modified Rankin (Stroke Patients Only)       Balance Overall balance assessment: Needs assistance Sitting-balance support: Feet supported;No upper extremity supported Sitting balance-Leahy Scale: Fair     Standing balance support: No upper extremity supported;During functional activity Standing balance-Leahy Scale: Poor Standing balance comment: Reliant on UE support                             Pertinent Vitals/Pain Pain Assessment: Faces Faces Pain Scale: Hurts little more Pain Location: lower back/incision area Pain Descriptors / Indicators: Operative site guarding Pain Intervention(s): Limited activity within patient's tolerance;Monitored during session;Repositioned    Home Living Family/patient expects to be discharged to:: Private residence Living Arrangements: Spouse/significant other Available Help at Discharge: Family;Available 24 hours/day Type of Home: House Home Access: Stairs to enter Entrance Stairs-Rails: None Entrance Stairs-Number of Steps: 1 Home Layout: One level Home Equipment: Cane - single point;Shower seat - built in Additional Comments: Pt has a standard walker but requires a youth size.    Prior Function Level of Independence: Needs assistance   Gait / Transfers Assistance Needed: Utilizing RW and cane as needed  ADL's / Homemaking Assistance Needed: Husband assisting with  lower body dressing and washing feet/lower legs  Hand Dominance        Extremity/Trunk Assessment   Upper Extremity Assessment Upper Extremity Assessment: Defer to OT evaluation    Lower Extremity Assessment Lower Extremity Assessment: Generalized weakness (Consistent with pre-op diagnosis)    Cervical / Trunk Assessment Cervical / Trunk Assessment: Other exceptions Cervical / Trunk Exceptions: s/p surgery  Communication   Communication: HOH  Cognition Arousal/Alertness: Awake/alert Behavior During Therapy: WFL for tasks assessed/performed Overall Cognitive Status: Impaired/Different from baseline Area of Impairment: Problem solving                             Problem Solving: Slow processing;Requires verbal cues        General Comments      Exercises     Assessment/Plan    PT Assessment Patient needs continued PT services  PT Problem List Decreased strength;Decreased activity tolerance;Decreased balance;Decreased mobility;Decreased knowledge of use of DME;Decreased safety awareness;Decreased knowledge of precautions;Pain       PT Treatment Interventions DME instruction;Gait training;Functional mobility training;Stair training;Therapeutic activities;Therapeutic exercise;Neuromuscular re-education;Patient/family education    PT Goals (Current goals can be found in the Care Plan section)  Acute Rehab PT Goals Patient Stated Goal: Home today PT Goal Formulation: With patient/family Time For Goal Achievement: 05/10/21 Potential to Achieve Goals: Good    Frequency Min 5X/week   Barriers to discharge        Co-evaluation               AM-PAC PT "6 Clicks" Mobility  Outcome Measure Help needed turning from your back to your side while in a flat bed without using bedrails?: None Help needed moving from lying on your back to sitting on the side of a flat bed without using bedrails?: A Little Help needed moving to and from a bed to a  chair (including a wheelchair)?: A Little Help needed standing up from a chair using your arms (e.g., wheelchair or bedside chair)?: A Little Help needed to walk in hospital room?: A Little Help needed climbing 3-5 steps with a railing? : A Little 6 Click Score: 19    End of Session Equipment Utilized During Treatment: Gait belt Activity Tolerance: Patient tolerated treatment well Patient left: in chair;with call bell/phone within reach Nurse Communication: Mobility status PT Visit Diagnosis: Unsteadiness on feet (R26.81);Pain Pain - part of body:  (back)    Time: 4920-1007 PT Time Calculation (min) (ACUTE ONLY): 34 min   Charges:   PT Evaluation $PT Eval Low Complexity: 1 Low PT Treatments $Gait Training: 8-22 mins        Rolinda Roan, PT, DPT Acute Rehabilitation Services Pager: [phone] Office: [phone]   Thelma Comp 05/03/2021, 8:54 AM

## 2021-05-03 NOTE — Discharge Summary (Signed)
Physician Discharge Summary  Patient ID: Veronica Garza MRN: [id number] DOB/AGE: 79-Dec-1942 79 y.o.  Admit date: 05/02/2021 Discharge date: 05/03/2021  Admission Diagnoses:  Lumbar spinal stenosis L4-5 with left lower extremity radiculopathy     Discharge Diagnoses: same   Discharged Condition: good  Hospital Course: The patient was admitted on 05/02/2021 and taken to the operating room where the patient underwent left l4-5 hemilaminectomy. The patient tolerated the procedure well and was taken to the recovery room and then to the floor in stable condition. The hospital course was routine. There were no complications. The wound remained clean dry and intact. Pt had appropriate back soreness. No complaints of leg pain or new N/T/W. The patient remained afebrile with stable vital signs, and tolerated a regular diet. The patient continued to increase activities, and pain was well controlled with oral pain medications.   Consults: None  Significant Diagnostic Studies:  Results for orders placed or performed during the hospital encounter of 05/01/21  SARS CORONAVIRUS 2 (TAT 6-24 HRS) Nasopharyngeal Nasopharyngeal Swab   Specimen: Nasopharyngeal Swab  Result Value Ref Range   SARS Coronavirus 2 NEGATIVE NEGATIVE    Chest 2 View  Result Date: 04/27/2021 CLINICAL DATA:  Preadmit lumbar surgery EXAM: CHEST - 2 VIEW COMPARISON:  03/19/2021 FINDINGS: Bilateral shoulder replacements. No focal opacity or pleural effusion. Borderline to mild cardiomegaly without edema. No pneumothorax. Degenerative changes of the spine. IMPRESSION: No active cardiopulmonary disease.  Borderline to mild cardiomegaly Electronically Signed   By: Donavan Foil M.D.   On: 04/27/2021 20:08   DG Lumbar Spine 2-3 Views  Result Date: 05/02/2021 CLINICAL DATA:  Intraoperative localization. EXAM: LUMBAR SPINE - 2-3 VIEW COMPARISON:  Lumbar spine radiographs-03/19/2021 FINDINGS: Two spot intraoperative lateral projection  radiographic images of the lower lumbar spine are provided for review. Spinal labeling is in keeping with preprocedural lumbar spine radiographs performed 03/19/2021 Radiograph #1 demonstrates a radiopaque marking instrument posterior to the L5 vertebral body. Radiograph #2 demonstrates radiopaque marking instrument posterior to the L4-L5 intervertebral disc space with associated adjacent radiopaque surgical support apparatus. IMPRESSION: Intraoperative localization of L4-L5 as above. Electronically Signed   By: Sandi Mariscal M.D.   On: 05/02/2021 10:25    Antibiotics:  Anti-infectives (From admission, onward)   Start     Dose/Rate Route Frequency Ordered Stop   05/02/21 1700  ceFAZolin (ANCEF) IVPB 2g/100 mL premix        2 g 200 mL/hr over 30 Minutes Intravenous Every 8 hours 05/02/21 1205 05/03/21 0053   05/02/21 0700  ceFAZolin (ANCEF) IVPB 2g/100 mL premix        2 g 200 mL/hr over 30 Minutes Intravenous On call to O.R. 05/02/21 0654 05/02/21 0914      Discharge Exam: Blood pressure (!) 174/72, pulse 62, temperature 97.8 F (36.6 C), temperature source Oral, resp. rate 16, height 4\' 11"  (1.499 m), weight 95.8 kg, SpO2 99 %. Neurologic: Grossly normal Ambulating and voiding well, incision cdi   Discharge Medications:   Allergies as of 05/03/2021      Reactions   Tolterodine Other (See Comments)   dizziness   Iohexol Hives    Code: HIVES, Desc: ? Contrast reaction from CT prior to 2000.   Naproxen Itching, Rash   Neomycin-bacitracin Zn-polymyx Itching, Rash   REACTION: redness   Sulfonamide Derivatives Itching, Rash   tingling      Medication List    TAKE these medications   acetaminophen 650 MG CR tablet Commonly known as: TYLENOL  Take 650-1,300 mg by mouth every 8 (eight) hours as needed for pain.   Calcium Carbonate-Vitamin D 600-400 MG-UNIT tablet Take 1 tablet by mouth in the morning.   CALTRATE 600+D3 PO Take 1 tablet by mouth in the morning.   celecoxib 200 MG  capsule Commonly known as: CELEBREX Take 200 mg by mouth in the morning and at bedtime.   cyclobenzaprine 10 MG tablet Commonly known as: FLEXERIL Take 1 tablet (10 mg total) by mouth 2 (two) times daily as needed for muscle spasms.   gabapentin 300 MG capsule Commonly known as: NEURONTIN Take 1 capsule (300 mg total) by mouth 2 (two) times daily.   HYDROcodone-acetaminophen 5-325 MG tablet Commonly known as: NORCO/VICODIN Take 1 tablet by mouth every 4 (four) hours as needed for moderate pain.   irbesartan 300 MG tablet Commonly known as: AVAPRO TAKE ONE TABLET BY MOUTH DAILY What changed: when to take this   methocarbamol 500 MG tablet Commonly known as: Robaxin Take 1 tablet (500 mg total) by mouth 4 (four) times daily.   methylPREDNISolone 4 MG Tbpk tablet Commonly known as: MEDROL DOSEPAK Follow package insert   multivitamin with minerals tablet Take 1 tablet by mouth in the morning. Centrum   PRESERVISION AREDS 2 PO Take 1 capsule by mouth 2 (two) times daily.   omeprazole 40 MG capsule Commonly known as: PRILOSEC TAKE ONE CAPSULE BY MOUTH DAILY What changed: when to take this   PARoxetine 10 MG tablet Commonly known as: PAXIL TAKE ONE TABLET BY MOUTH DAILY What changed: when to take this   rosuvastatin 20 MG tablet Commonly known as: CRESTOR Take 1 tablet (20 mg total) by mouth daily. What changed: when to take this   solifenacin 10 MG tablet Commonly known as: VESICARE Take 1 tablet (10 mg total) by mouth daily. What changed: when to take this   Systane Balance 0.6 % Soln Generic drug: Propylene Glycol Place 1 drop into both eyes 3 (three) times daily as needed (dry eyes).   TURMERIC PO Take 1,000 mg by mouth in the morning.       Disposition: home   Final Dx: left L4-5 hemilaminectomy  Discharge Instructions     Remove dressing in 72 hours   Complete by: As directed    Call MD for:  difficulty breathing, headache or visual disturbances    Complete by: As directed    Call MD for:  hives   Complete by: As directed    Call MD for:  persistant nausea and vomiting   Complete by: As directed    Call MD for:  redness, tenderness, or signs of infection (pain, swelling, redness, odor or green/yellow discharge around incision site)   Complete by: As directed    Call MD for:  severe uncontrolled pain   Complete by: As directed    Call MD for:  temperature >100.4   Complete by: As directed    Diet - low sodium heart healthy   Complete by: As directed    Driving Restrictions   Complete by: As directed    No driving for 2 weeks, no riding in the car for 1 week   Increase activity slowly   Complete by: As directed    Lifting restrictions   Complete by: As directed    No lifting more than 8 lbs         Signed: Ocie Cornfield Felicie Kocher 05/03/2021, 7:57 AM

## 2021-05-03 NOTE — Progress Notes (Signed)
Patient was transported via wheelchair by volunteer for discharge home; in no acute distress but with complaints of mild pain; all belongings checked and accounted for and was taken along by husband with him to his vehicle; discharge instructions given to patient and her husband by RN and both verbalized understanding on the instructions given.

## 2021-05-05 ENCOUNTER — Other Ambulatory Visit: Payer: Self-pay | Admitting: Internal Medicine

## 2021-05-05 DIAGNOSIS — E785 Hyperlipidemia, unspecified: Secondary | ICD-10-CM

## 2021-05-16 ENCOUNTER — Other Ambulatory Visit: Payer: Self-pay | Admitting: Internal Medicine

## 2021-05-16 DIAGNOSIS — I10 Essential (primary) hypertension: Secondary | ICD-10-CM

## 2021-06-01 ENCOUNTER — Telehealth: Payer: Self-pay | Admitting: Pharmacist

## 2021-06-01 NOTE — Progress Notes (Signed)
Chronic Care Management Pharmacy Assistant   Name: Veronica Garza  MRN: [id number] DOB: [birth year]   Reason for Encounter: Disease State   Conditions to be addressed/monitored: HTN   Recent office visits:  None ID  Recent consult visits:  None ID  Hospital visits:  Medication Reconciliation was completed by comparing discharge summary, patient's EMR and Pharmacy list, and upon discussion with patient.  Admitted to the hospital on 05/02/21 due to S/P lumbar laminectomy. Discharge date was 05/03/21. Discharged from [facility]?Medications Started at [facility] Discharge:?? -started none note   Medication Changes at Hospital Discharge: -Changed none noted  Medications Discontinued at Hospital Discharge: -Stopped none noted   Medications that remain the same after Hospital Discharge:??  -All other medications will remain the same.    Medications: Outpatient Encounter Medications as of 06/01/2021  Medication Sig   acetaminophen (TYLENOL) 650 MG CR tablet Take 650-1,300 mg by mouth every 8 (eight) hours as needed for pain.   Calcium Carb-Cholecalciferol (CALTRATE 600+D3 PO) Take 1 tablet by mouth in the morning.   Calcium Carbonate-Vitamin D 600-400 MG-UNIT tablet Take 1 tablet by mouth in the morning.   celecoxib (CELEBREX) 200 MG capsule Take 200 mg by mouth in the morning and at bedtime.   cyclobenzaprine (FLEXERIL) 10 MG tablet Take 1 tablet (10 mg total) by mouth 2 (two) times daily as needed for muscle spasms. (Patient not taking: Reported on 04/20/2021)   gabapentin (NEURONTIN) 300 MG capsule Take 1 capsule (300 mg total) by mouth 2 (two) times daily.   HYDROcodone-acetaminophen (NORCO/VICODIN) 5-325 MG tablet Take 1 tablet by mouth every 4 (four) hours as needed for moderate pain.   irbesartan (AVAPRO) 300 MG tablet Take 1 tablet (300 mg total) by mouth every evening.   methocarbamol (ROBAXIN) 500 MG tablet Take 1 tablet (500 mg total) by mouth 4 (four)  times daily.   methylPREDNISolone (MEDROL DOSEPAK) 4 MG TBPK tablet Follow package insert (Patient not taking: Reported on 04/20/2021)   Multiple Vitamins-Minerals (MULTIVITAMIN WITH MINERALS) tablet Take 1 tablet by mouth in the morning. Centrum   Multiple Vitamins-Minerals (PRESERVISION AREDS 2 PO) Take 1 capsule by mouth 2 (two) times daily.   omeprazole (PRILOSEC) 40 MG capsule TAKE ONE CAPSULE BY MOUTH DAILY (Patient taking differently: Take 40 mg by mouth in the morning.)   PARoxetine (PAXIL) 10 MG tablet TAKE ONE TABLET BY MOUTH DAILY (Patient taking differently: Take 10 mg by mouth in the morning.)   Propylene Glycol (SYSTANE BALANCE) 0.6 % SOLN Place 1 drop into both eyes 3 (three) times daily as needed (dry eyes).   rosuvastatin (CRESTOR) 20 MG tablet Take 1 tablet (20 mg total) by mouth in the morning.   solifenacin (VESICARE) 10 MG tablet Take 1 tablet (10 mg total) by mouth daily. (Patient taking differently: Take 10 mg by mouth every evening.)   TURMERIC PO Take 1,000 mg by mouth in the morning.   No facility-administered encounter medications on file as of 06/01/2021.    Pharmacy Review Reviewed chart prior to disease state call. Spoke with patient regarding BP  Recent Office Vitals: BP Readings from Last 3 Encounters:  05/03/21 (!) 174/72  04/26/21 (!) 150/73  03/19/21 140/70   Pulse Readings from Last 3 Encounters:  05/03/21 62  04/26/21 77  03/19/21 77    Wt Readings from Last 3 Encounters:  05/02/21 211 lb 3.2 oz (95.8 kg)  04/26/21 211 lb 4 oz (95.8 kg)  03/15/21  218 lb 9.6 oz (99.2 kg)     Kidney Function Lab Results  Component Value Date/Time   CREATININE 0.80 04/26/2021 11:46 AM   CREATININE 1.06 (H) 03/19/2021 04:43 PM   CREATININE 0.91 08/17/2020 03:45 PM   GFR 54.66 (L) 03/15/2021 10:04 AM   GFRNONAA >60 04/26/2021 11:46 AM   GFRNONAA 61 08/17/2020 03:45 PM   GFRAA 71 08/17/2020 03:45 PM    BMP Latest Ref Rng & Units 04/26/2021 03/19/2021 03/15/2021   Glucose 70 - 99 mg/dL 123(H) 108(H) 100(H)  BUN 8 - 23 mg/dL 16 16 21   Creatinine 0.44 - 1.00 mg/dL 0.80 1.06(H) 0.99  BUN/Creat Ratio 6 - 22 (calc) - - -  Sodium 135 - 145 mmol/L 137 139 139  Potassium 3.5 - 5.1 mmol/L 3.5 4.4 4.9  Chloride 98 - 111 mmol/L 103 105 103  CO2 22 - 32 mmol/L 27 26 31   Calcium 8.9 - 10.3 mg/dL 8.8(L) 9.3 9.3    Current antihypertensive regimen:  Irbesartan 300 mg daily  How often are you checking your Blood Pressure? 3-5x per week  Current home BP readings: Patient states readings stay between 150s over 60s  What recent interventions/DTPs have been made by any provider to improve Blood Pressure control since last CPP Visit: Discussed blood pressure goals, contact pain management for further recommendations and continue current medication  Any recent hospitalizations or ED visits since last visit with CPP? Yes, patient states she had back surgery in June  What diet changes have been made to improve Blood Pressure Control?  Patient states that she has not made any changes to her diet  What exercise is being done to improve your Blood Pressure Control?  Patient states that she is not in a good position to exercise since back surgery  Adherence Review: Is the patient currently on ACE/ARB medication? Yes Does the patient have >5 day gap between last estimated fill dates? No  Star Rating Drugs: Irbesartan 05/16/21 90 ds Rosuvastatin 05/05/21 90 ds  Ethelene Hal Clinical Pharmacist Assistant [phone]   Time spent:25

## 2021-06-19 ENCOUNTER — Other Ambulatory Visit: Payer: Self-pay | Admitting: Internal Medicine

## 2021-06-19 DIAGNOSIS — N951 Menopausal and female climacteric states: Secondary | ICD-10-CM

## 2021-07-24 ENCOUNTER — Other Ambulatory Visit: Payer: Self-pay

## 2021-07-24 ENCOUNTER — Ambulatory Visit (INDEPENDENT_AMBULATORY_CARE_PROVIDER_SITE_OTHER): Payer: Medicare Other | Admitting: Pharmacist

## 2021-07-24 DIAGNOSIS — I10 Essential (primary) hypertension: Secondary | ICD-10-CM | POA: Diagnosis not present

## 2021-07-24 DIAGNOSIS — E785 Hyperlipidemia, unspecified: Secondary | ICD-10-CM | POA: Diagnosis not present

## 2021-07-24 DIAGNOSIS — G8929 Other chronic pain: Secondary | ICD-10-CM

## 2021-07-24 DIAGNOSIS — N3281 Overactive bladder: Secondary | ICD-10-CM

## 2021-07-24 MED ORDER — HYDRALAZINE HCL 10 MG PO TABS
10.0000 mg | ORAL_TABLET | Freq: Three times a day (TID) | ORAL | 0 refills | Status: DC
Start: 2021-07-24 — End: 2021-08-14

## 2021-07-24 NOTE — Progress Notes (Signed)
 Chronic Care Management Pharmacy Note  07/24/2021 Name:  Veronica Garza MRN:  [id number] DOB:  06/02/1941  Summary: -Pt reports BP at home 177/96-188/88, despite recent improvement pain after back sugery. She denies missed doses of medication. Pt had swelling with amlodipine previously, and declined diuretic due to hx of OAB.  Recommendations/Changes made from today's visit: -Start hydralazine 10 mg TID. Scheduled PCP f/u for BP check in 6 weeks   Subjective: Veronica Garza is an 80 y.o. year old female who is a primary patient of Jones, Thomas L, MD.  The CCM team was consulted for assistance with disease management and care coordination needs.    Engaged with patient by telephone for follow up visit in response to provider referral for pharmacy case management and/or care coordination services.   Consent to Services:  The patient was given information about Chronic Care Management services, agreed to services, and gave verbal consent prior to initiation of services.  Please see initial visit note for detailed documentation.   Patient Care Team: Jones, Thomas L, MD as PCP - General Alva, Rakesh V, MD as Consulting Physician (Pulmonary Disease) Nandigam, Kavitha V, MD as Consulting Physician (Gastroenterology) Foltanski, Lindsey N, RPH as Pharmacist (Pharmacist)  Recent office visits: 03/15/21 Dr Crawford OV: c/o leg swelling, BP high. BNP elevated, ordered ECHO. ECHO normal for age, not causing swelling.  02/08/21 Dr Crawford: BP elevated, back pain. Rx'd amlodipine 10 mg for 2-3 days prior to back injection (BP was previously controlled w/o back pain) and gabapentin 300 mg BID. Referred to spine specialist.  08/17/20 Dr Jones OV: chronic f/u, c/o frequent urination. Rx'd Gemtesa 75 mg to replace tolterodine. Eventually changed to solifenacin 10 mg.  Recent consult visits: 01/18/21 Dr Ramos (pain mgmt): injection for back pain given.  Hospital visits: Medication Reconciliation  was completed by comparing discharge summary, patient's EMR and Pharmacy list, and upon discussion with patient.  Admitted to the hospital on 05/02/21 due to Spinal surgery. Discharge date was 05/03/21 Discharged from [facility].    New?Medications Started at Hospital Discharge:?? -started hydrocodone/APAP and methocarbamol  Medications that remain the same after Hospital Discharge:??  -All other medications will remain the same.    Admitted to the ED on 03/19/21 due to back pain. Discharge date was 03/19/21. Discharged from [facility].  Given Fentanyl and diazepam in ED.  New?Medications Started at Hospital Discharge:?? -started cyclobenzaprine 10 mg BID and Medrol dosepak due to back pain  Medications that remain the same after Hospital Discharge:??  -All other medications will remain the same.    Objective:  Lab Results  Component Value Date   CREATININE 0.80 04/26/2021   BUN 16 04/26/2021   GFR 54.66 (L) 03/15/2021   GFRNONAA >60 04/26/2021   GFRAA 71 08/17/2020   NA 137 04/26/2021   K 3.5 04/26/2021   CALCIUM 8.8 (L) 04/26/2021   CO2 27 04/26/2021    Lab Results  Component Value Date/Time   HGBA1C 5.6 08/17/2020 03:45 PM   HGBA1C 5.8 05/19/2019 02:25 PM   GFR 54.66 (L) 03/15/2021 10:04 AM   GFR 88.49 05/10/2019 03:25 PM    Last diabetic Eye exam: No results found for: HMDIABEYEEXA  Last diabetic Foot exam: No results found for: HMDIABFOOTEX   Lab Results  Component Value Date   CHOL 159 08/17/2020   HDL 69 08/17/2020   LDLCALC 66 08/17/2020   LDLDIRECT 119.5 09/04/2012   TRIG 161 (H) 08/17/2020   CHOLHDL 2.3 08/17/2020      Hepatic Function Latest Ref Rng & Units 03/15/2021 08/17/2020 05/10/2019  Total Protein 6.0 - 8.3 g/dL 6.7 7.2 6.8  Albumin 3.5 - 5.2 g/dL 3.8 - 4.1  AST 0 - 37 U/L _0 ALT 0 - 35 U/L _1 Alk Phosphatase 39 - 117 U/L 88 - 119(H)  Total Bilirubin 0.2 - 1.2 mg/dL 0.6 0.7 0.5  Bilirubin, Direct 0.0 - 0.2 mg/dL - 0.2 -     Lab Results  Component Value Date/Time   TSH 1.79 08/17/2020 03:45 PM   TSH 1.74 05/19/2019 02:25 PM    CBC Latest Ref Rng & Units 04/26/2021 03/19/2021 03/15/2021  WBC 4.0 - 10.5 K/uL 10.3 5.7 6.0  Hemoglobin 12.0 - 15.0 g/dL 11.4(L) 11.2(L) 11.4(L)  Hematocrit 36.0 - 46.0 % 34.3(L) 33.1(L) 33.1(L)  Platelets 150 - 400 K/uL 174 179 187.0    No results found for: VD25OH  Clinical ASCVD: No  The 10-year ASCVD risk score Mikey Bussing DC Jr., et al., 2013) is: 37.8%   Values used to calculate the score:     Age: 80 years     Sex: Female     Is Non-Hispanic African American: No     Diabetic: No     Tobacco smoker: No     Systolic Blood Pressure: 706 mmHg     Is BP treated: Yes     HDL Cholesterol: 69 mg/dL     Total Cholesterol: 159 mg/dL    Depression screen [facility] 2/9 08/17/2020 06/22/2019 05/19/2019  Decreased Interest 0 0 0  Down, Depressed, Hopeless 0 0 0  PHQ - 2 Score 0 0 0  Altered sleeping - - -  Tired, decreased energy - - -  Change in appetite - - -  Feeling bad or failure about yourself  - - -  Trouble concentrating - - -  Moving slowly or fidgety/restless - - -  Suicidal thoughts - - -  PHQ-9 Score - - -  Difficult doing work/chores - - -  Some recent data might be hidden    Social History   Tobacco Use  Smoking Status Never  Smokeless Tobacco Never   BP Readings from Last 3 Encounters:  05/03/21 (!) 174/72  04/26/21 (!) 150/73  03/19/21 140/70   Pulse Readings from Last 3 Encounters:  05/03/21 62  04/26/21 77  03/19/21 77   Wt Readings from Last 3 Encounters:  05/02/21 211 lb 3.2 oz (95.8 kg)  04/26/21 211 lb 4 oz (95.8 kg)  03/15/21 218 lb 9.6 oz (99.2 kg)   BMI Readings from Last 3 Encounters:  05/02/21 42.66 kg/m  04/26/21 42.67 kg/m  03/15/21 44.15 kg/m     Assessment/Interventions: Review of patient past medical history, allergies, medications, health status, including review of consultants reports, laboratory and other test data, was  performed as part of comprehensive evaluation and provision of chronic care management services.   SDOH:  (Social Determinants of Health) assessments and interventions performed: Yes  SDOH Screenings   Alcohol Screen: Not on file  Depression (PHQ2-9): Low Risk    PHQ-2 Score: 0  Financial Resource Strain: Low Risk    Difficulty of Paying Living Expenses: Not very hard  Food Insecurity: Not on file  Housing: Not on file  Physical Activity: Not on file  Social Connections: Not on file  Stress: Not on file  Tobacco Use: Low Risk    Smoking Tobacco Use: Never   Smokeless Tobacco Use: Never  Transportation Needs: Not on  file    CCM Care Plan  Allergies  Allergen Reactions   Tolterodine Other (See Comments)    dizziness   Iohexol Hives     Code: HIVES, Desc: ? Contrast reaction from CT prior to 2000.    Naproxen Itching and Rash   Neomycin-Bacitracin Zn-Polymyx Itching and Rash    REACTION: redness   Sulfonamide Derivatives Itching and Rash    tingling    Medications Reviewed Today     Reviewed by Eustace Moore, MD (Physician) on 05/02/21 at [phone]  Med List Status: Complete   Medication Order Taking? Sig Documenting Provider Last Dose Status Informant  acetaminophen (TYLENOL) 650 MG CR tablet [id number] Yes Take 650-1,300 mg by mouth every 8 (eight) hours as needed for pain. [provider] 05/01/2021 Unknown time Active Self  Calcium Carb-Cholecalciferol (CALTRATE 600+D3 PO) [id number] Yes Take 1 tablet by mouth in the morning. [provider] Past Week Unknown time Active Self  Calcium Carbonate-Vitamin D 600-400 MG-UNIT tablet [id number] Yes Take 1 tablet by mouth in the morning. [provider] Past Week Unknown time Active Self  celecoxib (CELEBREX) 200 MG capsule [id number] Yes Take 200 mg by mouth in the morning and at bedtime. [provider] Past Week Unknown time Active Self  cyclobenzaprine (FLEXERIL) 10 MG tablet [id number] No Take 1  tablet (10 mg total) by mouth 2 (two) times daily as needed for muscle spasms.  Patient not taking: Reported on 04/20/2021   Lennice Sites, DO Not Taking Unknown time Consider Medication Status and Discontinue (No longer needed (for PRN medications)) Self  gabapentin (NEURONTIN) 300 MG capsule [id number] Yes Take 1 capsule (300 mg total) by mouth 2 (two) times daily. Hoyt Koch, MD Past Month Unknown time Active Self  irbesartan (AVAPRO) 300 MG tablet [id number] Yes TAKE ONE TABLET BY MOUTH DAILY  Patient taking differently: Take 300 mg by mouth every evening.   Janith Lima, MD Past Week Unknown time Active   methylPREDNISolone (MEDROL DOSEPAK) 4 MG TBPK tablet [id number] No Follow package insert  Patient not taking: Reported on 04/20/2021   Lennice Sites, DO Not Taking Unknown time Consider Medication Status and Discontinue (Completed Course) Self  Multiple Vitamins-Minerals (MULTIVITAMIN WITH MINERALS) tablet [id number] Yes Take 1 tablet by mouth in the morning. Centrum [provider] Past Week Unknown time Active Self  Multiple Vitamins-Minerals (PRESERVISION AREDS 2 PO) [id number] Yes Take 1 capsule by mouth 2 (two) times daily. [provider] Past Week Unknown time Active Self  omeprazole (PRILOSEC) 40 MG capsule [id number] Yes TAKE ONE CAPSULE BY MOUTH DAILY  Patient taking differently: Take 40 mg by mouth in the morning.   Mauri Pole, MD Past Week Unknown time Active   PARoxetine (PAXIL) 10 MG tablet [id number] Yes TAKE ONE TABLET BY MOUTH DAILY  Patient taking differently: Take 10 mg by mouth in the morning.   Janith Lima, MD 05/02/2021 0430 Active   Propylene Glycol (SYSTANE BALANCE) 0.6 % SOLN [id number] Yes Place 1 drop into both eyes 3 (three) times daily as needed (dry eyes). [provider] 05/01/2021 Unknown time Active Self  rosuvastatin (CRESTOR) 20 MG tablet [id number] Yes Take 1 tablet (20 mg total) by mouth daily.  Patient taking  differently: Take 20 mg by mouth in the morning.   Janith Lima, MD 05/02/2021 0430 Active Self  solifenacin (VESICARE) 10 MG tablet [id number] Yes Take 1 tablet (10 mg total) by mouth daily.  Patient taking differently: Take 10 mg  by mouth every evening.   Janith Lima, MD 05/02/2021 0430 Active   TURMERIC PO [id number] Yes Take 1,000 mg by mouth in the morning. [provider] Past Week Unknown time Active Self            Patient Active Problem List   Diagnosis Date Noted   S/P lumbar laminectomy 05/02/2021   Leg swelling 03/15/2021   Jet lag syndrome 05/16/2020   Status post total right knee replacement 08/17/2019   OAB (overactive bladder) 06/22/2019   ILD (interstitial lung disease) ([place]) 12/15/2018   Enlarged heart 06/16/2018   Morbid obesity ([place]) 10/17/2017   Functional belching disorder 12/31/2016   Hot flashes, menopausal 12/31/2016   DDD (degenerative disc disease), lumbar 11/06/2015   Right-sided low back pain without sciatica 47/65/4650   Diastolic dysfunction without heart failure 11/13/2014   Other abnormal glucose 05/27/2013   Osteopenia of the elderly 05/27/2013   Hyperlipidemia LDL goal <130 09/04/2012   Esophageal stricture 09/12/2011   Visit for screening mammogram 08/08/2011   Routine general medical examination at a health care facility 08/08/2011   Essential hypertension, benign 01/14/2011   URINARY INCONTINENCE 12/14/2008   OA (osteoarthritis) 07/21/2008   Obstructive sleep apnea 10/14/2007   GERD 10/14/2007    Immunization History  Administered Date(s) Administered   Fluad Quad(high Dose 65+) 09/10/2019, 08/17/2020   Influenza Split 09/04/2012   Influenza Whole 08/30/2009, 09/18/2010, 08/08/2011   Influenza, High Dose Seasonal PF 08/30/2014, 09/02/2016, 09/11/2017, 09/22/2018   Influenza,inj,Quad PF,6+ Mos 09/02/2013, 09/01/2015   Pneumococcal Conjugate-13 01/01/2018   Pneumococcal Polysaccharide-23 05/19/2019   Td 04/06/2010   Tdap  07/19/2019   Zoster Recombinat (Shingrix) 12/11/2019, 03/10/2020   Zoster, Live 09/04/2012    Conditions to be addressed/monitored:  Hypertension, Hyperlipidemia, Overactive Bladder and Back pain  Care Plan : [place]  Updates made by Charlton Haws, [place] since 07/24/2021 12:00 AM     Problem: Hypertension, Hyperlipidemia, Overactive Bladder and Back pain   Priority: High     Long-Range Goal: Disease management   Start Date: 01/25/2021  Expected End Date: 07/25/2021  This Visit's Progress: Not on track  Recent Progress: On track  Priority: High  Note:   Current Barriers:  Unable to independently monitor therapeutic efficacy Unable to maintain control of HTN  Pharmacist Clinical Goal(s):  Patient will achieve adherence to monitoring guidelines and medication adherence to achieve therapeutic efficacy maintain control of HTN as evidenced by patient report  through collaboration with PharmD and provider.   Interventions: 1:1 collaboration with Janith Lima, MD regarding development and update of comprehensive plan of care as evidenced by provider attestation and co-signature Inter-disciplinary care team collaboration (see longitudinal plan of care) Comprehensive medication review performed; medication list updated in electronic medical record  Hypertension (BP goal < 140/90) Uncontrolled - BP is high at home; pt declines missed doses of medication; she reports pain is actually improved since back surgery; she denies excess salt, caffeine; she is taking NSAIDs (Celebrex) twice a day; of note her BP monitor is < 31 year old and she has no reason to suspect the readings are inaccurate Home BP: 188/88, 181/86, 177/96 Current regimen:  Irbesartan 300 mg daily Previously tried: amlodipine, Edarbi, losartan, furosemide Interventions: Counseled on proper BP monitoring technique Counseled on factors that can raise BP - NSAIDs, salt, caffeine, pain, stress Discussed  medication options - pt previously had swelling with amlodipine so would like to avoid; she also suffers from OAB and would like  to avoid diuretics Advised patient to bring BP monitor to next PCP visit to ensure accuracy Plan: Start Hydralazine 10 mg TID. F/U w/ PCP in 6 weeks.   Hyperlipidemia (LDL goal < 100) Controlled  - LDL is at goal; pt endorses compliance with statin Current regimen:  Rosuvastatin 20 mg daily Interventions: Discussed cholesterol goals and benefits of medications for prevention of heart attack / stroke Counseled that stopping statin would most likely lead to cholesterol rebounding to pre-statin levels Recommend to continue current medication  Overactive bladder Controlled - per patient report medication has helped  Current regimen:  Solifenacin 10 mg daily  Previously tried/failed: tolterodine, Gemtesa (cost), Myrbetriq Interventions: Discussed benefits of medication Recommend to continue current medication  Back pain Controlled - pt reports pain has improved since back surgery; she is not taking any pain medication other than Celebrex; she asked if she should switch to ibuprofen; she reports Tylenol has not been effective for her in the past -Back surgery 05/02/21 Current regimen:  Celecoxib 200 mg BID Tylenol 500 mg PRN -  Interventions: Discussed ibuprofen and celecoxib are in same class (NSAIDS) and should not be used together Counseled on NSAIDs risks included increased BP - it is likely that NSAID use is contributing to current high BP Recommended to limit NSAID use as much as possible by alternating with Tylenol whenever possible  Patient Goals/Self-Care Activities Patient will:  - take medications as prescribed -focus on medication adherence by pill box -Check BP daily -Limit Celebrex use as much as possible -Start Hydralazine 10 mg 3 times daily -Keep PCP visit 09/03/21      Medication Assistance: None required.  Patient affirms current  coverage meets needs.  Compliance/Adherence/Medication fill history: Care Gaps: Hep C screening  Star-Rating Drugs: Irbesartan - LF 05/16/21 x 90 ds Rosuvastatin - LF 05/05/21 x 90 ds  Patient's preferred pharmacy is:  [facility] - [place], [place] - [address] [place] [address] Phone: [phone] Fax: [phone]  [facility] - [place], [place] - [address] [place] [address] Phone: [phone] Fax: [phone]  Uses pill box? Yes Pt endorses 100% compliance  We discussed: Current pharmacy is preferred with insurance plan and patient is satisfied with pharmacy services Patient decided to: Continue current medication management strategy  Care Plan and Follow Up Patient Decision:  Patient agrees to Care Plan and Follow-up.  Plan: Telephone follow up appointment with care management team member scheduled for:  1 months  Lindsey Foltanski, PharmD, BCACP, CPP Clinical Pharmacist [facility] [phone]  

## 2021-07-24 NOTE — Patient Instructions (Signed)
Visit Information  Phone number for Pharmacist: [phone]   Goals Addressed             This Visit's Progress    Manage My Medicine       Timeframe:  Long-Range Goal Priority:  Medium Start Date:     04/10/21                        Expected End Date:   10/31/21                    Follow Up Date Sept 2022   - call for medicine refill 2 or 3 days before it runs out - call if I am sick and can't take my medicine - keep a list of all the medicines I take; vitamins and herbals too - use a pillbox to sort medicine  -Limit celecoxib as much as possible; try alternating with Tylenol   Why is this important?   These steps will help you keep on track with your medicines.   Notes:      Track and Manage My Blood Pressure-Hypertension       Timeframe:  Long-Range Goal Priority:  High Start Date:      07/24/21                       Expected End Date:    01/24/22                   Follow Up Date Sept 2022   - check blood pressure daily - choose a place to take my blood pressure (home, clinic or office, retail store) - write blood pressure results in a log or diary  -Start hydralazine 10 mg 3 times daily -Bring BP monitor to PCP visit to be callibrated   Why is this important?   You won't feel high blood pressure, but it can still hurt your blood vessels.  High blood pressure can cause heart or kidney problems. It can also cause a stroke.  Making lifestyle changes like losing a little weight or eating less salt will help.  Checking your blood pressure at home and at different times of the day can help to control blood pressure.  If the doctor prescribes medicine remember to take it the way the doctor ordered.  Call the office if you cannot afford the medicine or if there are questions about it.     Notes:         Care Plan : CCM Pharmacy Care Plan  Updates made by Charlton Haws, RPH since 07/24/2021 12:00 AM     Problem: Hypertension, Hyperlipidemia, Overactive  Bladder and Back pain   Priority: High     Long-Range Goal: Disease management   Start Date: 01/25/2021  Expected End Date: 07/25/2021  This Visit's Progress: Not on track  Recent Progress: On track  Priority: High  Note:   Current Barriers:  Unable to independently monitor therapeutic efficacy Unable to maintain control of HTN  Pharmacist Clinical Goal(s):  Patient will achieve adherence to monitoring guidelines and medication adherence to achieve therapeutic efficacy maintain control of HTN as evidenced by patient report  through collaboration with PharmD and provider.   Interventions: 1:1 collaboration with Janith Lima, MD regarding development and update of comprehensive plan of care as evidenced by provider attestation and co-signature Inter-disciplinary care team collaboration (see longitudinal plan of care) Comprehensive medication review performed; medication  list updated in electronic medical record  Hypertension (BP goal < 140/90) Uncontrolled - BP is high at home; pt declines missed doses of medication; she reports pain is actually improved since back surgery; she denies excess salt, caffeine; she is taking NSAIDs (Celebrex) twice a day; of note her BP monitor is < 67 year old and she has no reason to suspect the readings are inaccurate Home BP: 188/88, 181/86, 177/96 Current regimen:  Irbesartan 300 mg daily Previously tried: amlodipine, Edarbi, losartan, furosemide Interventions: Counseled on proper BP monitoring technique Counseled on factors that can raise BP - NSAIDs, salt, caffeine, pain, stress Discussed medication options - pt previously had swelling with amlodipine so would like to avoid; she also suffers from OAB and would like to avoid diuretics Advised patient to bring BP monitor to next PCP visit to ensure accuracy Plan: Start Hydralazine 10 mg TID. F/U w/ PCP in 6 weeks.   Hyperlipidemia (LDL goal < 100) Controlled  - LDL is at goal; pt endorses  compliance with statin Current regimen:  Rosuvastatin 20 mg daily Interventions: Discussed cholesterol goals and benefits of medications for prevention of heart attack / stroke Counseled that stopping statin would most likely lead to cholesterol rebounding to pre-statin levels Recommend to continue current medication  Overactive bladder Controlled - per patient report medication has helped  Current regimen:  Solifenacin 10 mg daily  Previously tried/failed: tolterodine, Gemtesa (cost), Myrbetriq Interventions: Discussed benefits of medication Recommend to continue current medication  Back pain Controlled - pt reports pain has improved since back surgery; she is not taking any pain medication other than Celebrex; she asked if she should switch to ibuprofen; she reports Tylenol has not been effective for her in the past -Back surgery 05/02/21 Current regimen:  Celecoxib 200 mg BID Tylenol 500 mg PRN -  Interventions: Discussed ibuprofen and celecoxib are in same class (NSAIDS) and should not be used together Counseled on NSAIDs risks included increased BP - it is likely that NSAID use is contributing to current high BP Recommended to limit NSAID use as much as possible by alternating with Tylenol whenever possible  Patient Goals/Self-Care Activities Patient will:  - take medications as prescribed -focus on medication adherence by pill box -Check BP daily -Limit Celebrex use as much as possible -Start Hydralazine 10 mg 3 times daily -Keep PCP visit 09/03/21       The patient verbalized understanding of instructions, educational materials, and care plan provided today and declined offer to receive copy of patient instructions, educational materials, and care plan.  Telephone follow up appointment with pharmacy team member scheduled for: 1 month  Charlene Brooke, PharmD, [place], CPP Clinical Pharmacist [facility] [phone]

## 2021-08-14 ENCOUNTER — Ambulatory Visit (INDEPENDENT_AMBULATORY_CARE_PROVIDER_SITE_OTHER): Payer: Medicare Other | Admitting: Pharmacist

## 2021-08-14 ENCOUNTER — Other Ambulatory Visit: Payer: Self-pay

## 2021-08-14 DIAGNOSIS — N3281 Overactive bladder: Secondary | ICD-10-CM

## 2021-08-14 DIAGNOSIS — I10 Essential (primary) hypertension: Secondary | ICD-10-CM

## 2021-08-14 DIAGNOSIS — G8929 Other chronic pain: Secondary | ICD-10-CM

## 2021-08-14 DIAGNOSIS — E785 Hyperlipidemia, unspecified: Secondary | ICD-10-CM

## 2021-08-14 MED ORDER — HYDRALAZINE HCL 50 MG PO TABS: 50.0000 mg | ORAL_TABLET | Freq: Three times a day (TID) | ORAL | 0 refills | Status: DC

## 2021-08-14 NOTE — Progress Notes (Addendum)
Chronic Care Management Pharmacy Note  08/14/2021 Name:  Veronica Garza MRN:  [id number] DOB:  [birth year]  Summary: -Pt reports BP at home 167/91-193/81 despite recent addition of hydralazine 10 mg TID. She denies missed doses of medication. Pt had swelling with amlodipine previously, and declined diuretic due to hx of OAB. -Pt is taking celecoxib only for pain relief, may be contributing to high BP. -Pt previously asked to switch PCP to Dr Sharlet Salina - switch was approved by both providers in April per telephone notes, but chart was not updated  Recommendations/Changes made from today's visit: -Increase hydralazine to 50 mg TID. Scheduled PCP f/u for BP check in 3 weeks -Advised to stop celcoxib and use Tylenol 650 mg - 2 tab BID prn. -F/U on updating chart with new PCP Dr Sharlet Salina   Subjective: Veronica Garza is an 80 y.o. year old female who is a primary patient of Dr Pricilla Holm. The CCM team was consulted for assistance with disease management and care coordination needs.    Engaged with patient by telephone for follow up visit in response to provider referral for pharmacy case management and/or care coordination services.   Consent to Services:  The patient was given information about Chronic Care Management services, agreed to services, and gave verbal consent prior to initiation of services.  Please see initial visit note for detailed documentation.   Patient Care Team: Janith Lima, MD as PCP - General Rigoberto Noel, MD as Consulting Physician (Pulmonary Disease) Mauri Pole, MD as Consulting Physician (Gastroenterology) Charlton Haws, [facility] as Pharmacist (Pharmacist)  Recent office visits: 03/15/21 Dr Sharlet Salina OV: c/o leg swelling, BP high. BNP elevated, ordered ECHO. ECHO normal for age, not causing swelling.  02/08/21 Dr Sharlet Salina: BP elevated, back pain. Rx'd amlodipine 10 mg for 2-3 days prior to back injection (BP was previously controlled w/o  back pain) and gabapentin 300 mg BID. Referred to spine specialist.  08/17/20 Dr Ronnald Ramp OV: chronic f/u, c/o frequent urination. Rx'd Gemtesa 75 mg to replace tolterodine. Eventually changed to solifenacin 10 mg.  Recent consult visits: 01/18/21 Dr Nelva Bush (pain mgmt): injection for back pain given.  Hospital visits: Medication Reconciliation was completed by comparing discharge summary, patient's EMR and Pharmacy list, and upon discussion with patient.  Admitted to the hospital on 05/02/21 due to Spinal surgery. Discharge date was 05/03/21 Discharged from [facility]?Medications Started at [facility] Discharge:?? -started hydrocodone/APAP and methocarbamol  Medications that remain the same after Hospital Discharge:??  -All other medications will remain the same.    Admitted to the ED on 03/19/21 due to back pain. Discharge date was 03/19/21. Discharged from [facility] ED.  Given Fentanyl and diazepam in ED.  New?Medications Started at [facility] Discharge:?? -started cyclobenzaprine 10 mg BID and Medrol dosepak due to back pain  Medications that remain the same after Hospital Discharge:??  -All other medications will remain the same.    Objective:  Lab Results  Component Value Date   CREATININE 0.80 04/26/2021   BUN 16 04/26/2021   GFR 54.66 (L) 03/15/2021   GFRNONAA >60 04/26/2021   GFRAA 71 08/17/2020   NA 137 04/26/2021   K 3.5 04/26/2021   CALCIUM 8.8 (L) 04/26/2021   CO2 27 04/26/2021    Lab Results  Component Value Date/Time   HGBA1C 5.6 08/17/2020 03:45 PM   HGBA1C 5.8 05/19/2019 02:25 PM   GFR 54.66 (L) 03/15/2021 10:04 AM   GFR 88.49 05/10/2019 03:25  PM    Last diabetic Eye exam: No results found for: HMDIABEYEEXA  Last diabetic Foot exam: No results found for: HMDIABFOOTEX   Lab Results  Component Value Date   CHOL 159 08/17/2020   HDL 69 08/17/2020   LDLCALC 66 08/17/2020   LDLDIRECT 119.5 09/04/2012   TRIG 161 (H) 08/17/2020   CHOLHDL 2.3  08/17/2020    Hepatic Function Latest Ref Rng & Units 03/15/2021 08/17/2020 05/10/2019  Total Protein 6.0 - 8.3 g/dL 6.7 7.2 6.8  Albumin 3.5 - 5.2 g/dL 3.8 - 4.1  AST 0 - 37 U/L _0 ALT 0 - 35 U/L _1 Alk Phosphatase 39 - 117 U/L 88 - 119(H)  Total Bilirubin 0.2 - 1.2 mg/dL 0.6 0.7 0.5  Bilirubin, Direct 0.0 - 0.2 mg/dL - 0.2 -    Lab Results  Component Value Date/Time   TSH 1.79 08/17/2020 03:45 PM   TSH 1.74 05/19/2019 02:25 PM    CBC Latest Ref Rng & Units 04/26/2021 03/19/2021 03/15/2021  WBC 4.0 - 10.5 K/uL 10.3 5.7 6.0  Hemoglobin 12.0 - 15.0 g/dL 11.4(L) 11.2(L) 11.4(L)  Hematocrit 36.0 - 46.0 % 34.3(L) 33.1(L) 33.1(L)  Platelets 150 - 400 K/uL 174 179 187.0    No results found for: VD25OH  Clinical ASCVD: No  The 10-year ASCVD risk score (Arnett DK, et al., 2019) is: 37.8%   Values used to calculate the score:     Age: 80 years     Sex: Female     Is Non-Hispanic African American: No     Diabetic: No     Tobacco smoker: No     Systolic Blood Pressure: 491 mmHg     Is BP treated: Yes     HDL Cholesterol: 69 mg/dL     Total Cholesterol: 159 mg/dL    Depression screen [facility] 2/9 08/17/2020 06/22/2019 05/19/2019  Decreased Interest 0 0 0  Down, Depressed, Hopeless 0 0 0  PHQ - 2 Score 0 0 0  Altered sleeping - - -  Tired, decreased energy - - -  Change in appetite - - -  Feeling bad or failure about yourself  - - -  Trouble concentrating - - -  Moving slowly or fidgety/restless - - -  Suicidal thoughts - - -  PHQ-9 Score - - -  Difficult doing work/chores - - -  Some recent data might be hidden    Social History   Tobacco Use  Smoking Status Never  Smokeless Tobacco Never   BP Readings from Last 3 Encounters:  05/03/21 (!) 174/72  04/26/21 (!) 150/73  03/19/21 140/70   Pulse Readings from Last 3 Encounters:  05/03/21 62  04/26/21 77  03/19/21 77   Wt Readings from Last 3 Encounters:  05/02/21 211 lb 3.2 oz (95.8 kg)  04/26/21 211 lb 4 oz  (95.8 kg)  03/15/21 218 lb 9.6 oz (99.2 kg)   BMI Readings from Last 3 Encounters:  05/02/21 42.66 kg/m  04/26/21 42.67 kg/m  03/15/21 44.15 kg/m     Assessment/Interventions: Review of patient past medical history, allergies, medications, health status, including review of consultants reports, laboratory and other test data, was performed as part of comprehensive evaluation and provision of chronic care management services.   SDOH:  (Social Determinants of Health) assessments and interventions performed: Yes  SDOH Screenings   Alcohol Screen: Not on file  Depression (PHQ2-9): Low Risk    PHQ-2 Score: 0  Financial Resource Strain:  Not on file  Food Insecurity: Not on file  Housing: Not on file  Physical Activity: Not on file  Social Connections: Not on file  Stress: Not on file  Tobacco Use: Low Risk    Smoking Tobacco Use: Never   Smokeless Tobacco Use: Never  Transportation Needs: Not on file    CCM Care Plan  Allergies  Allergen Reactions   Tolterodine Other (See Comments)    dizziness   Iohexol Hives     Code: HIVES, Desc: ? Contrast reaction from CT prior to 2000.    Naproxen Itching and Rash   Neomycin-Bacitracin Zn-Polymyx Itching and Rash    REACTION: redness   Sulfonamide Derivatives Itching and Rash    tingling    Medications Reviewed Today     Reviewed by Charlton Haws, [facility] (Pharmacist) on 08/14/21 at 0950  Med List Status: <None>   Medication Order Taking? Sig Documenting Provider Last Dose Status Informant  acetaminophen (TYLENOL) 650 MG CR tablet [id number] Yes Take 650-1,300 mg by mouth every 8 (eight) hours as needed for pain. [provider] Taking Active Self  Calcium Carb-Cholecalciferol (CALTRATE 600+D3 PO) [id number] Yes Take 1 tablet by mouth in the morning. [provider] Taking Active Self  Calcium Carbonate-Vitamin D 600-400 MG-UNIT tablet [id number] Yes Take 1 tablet by mouth in the morning. [provider] Taking Active Self  celecoxib (CELEBREX) 200 MG capsule [id number] Yes Take 200 mg by mouth in the morning and at bedtime. [provider] Taking Active Self  cyclobenzaprine (FLEXERIL) 10 MG tablet [id number] Yes Take 1 tablet (10 mg total) by mouth 2 (two) times daily as needed for muscle spasms. Lennice Sites, DO Taking Active Self  gabapentin (NEURONTIN) 300 MG capsule [id number] Yes Take 1 capsule (300 mg total) by mouth 2 (two) times daily. Hoyt Koch, MD Taking Active Self  hydrALAZINE (APRESOLINE) 10 MG tablet [id number] Yes Take 1 tablet (10 mg total) by mouth 3 (three) times daily. Janith Lima, MD Taking Active   Discontinued 08/14/21 [phone] (Completed Course)   irbesartan (AVAPRO) 300 MG tablet [id number] Yes Take 1 tablet (300 mg total) by mouth every evening. Janith Lima, MD Taking Active   methocarbamol (ROBAXIN) 500 MG tablet [id number] Yes Take 1 tablet (500 mg total) by mouth 4 (four) times daily. Eleonore Chiquito, NP Taking Active   Discontinued 08/14/21 [phone] (Completed Course) Multiple Vitamins-Minerals (MULTIVITAMIN WITH MINERALS) tablet [id number] Yes Take 1 tablet by mouth in the morning. Centrum [provider] Taking Active Self  Multiple Vitamins-Minerals (PRESERVISION AREDS 2 PO) [id number] Yes Take 1 capsule by mouth 2 (two) times daily. [provider] Taking Active Self  omeprazole (PRILOSEC) 40 MG capsule [id number] Yes TAKE ONE CAPSULE BY MOUTH DAILY  Patient taking differently: Take 40 mg by mouth in the morning.   Mauri Pole, MD Taking Active   PARoxetine (PAXIL) 10 MG tablet [id number] Yes TAKE ONE TABLET BY MOUTH DAILY Janith Lima, MD Taking Active   Propylene Glycol (SYSTANE BALANCE) 0.6 % SOLN [id number] Yes Place 1 drop into both eyes 3 (three) times daily as needed (dry eyes). [provider] Taking Active Self  rosuvastatin (CRESTOR) 20 MG tablet [id number] Yes Take 1 tablet (20 mg total) by mouth  in the morning. Janith Lima, MD Taking Active   solifenacin (VESICARE) 10 MG tablet [id number] Yes Take 1 tablet (10 mg total) by mouth daily.  Patient taking differently: Take 10 mg by mouth every evening.  Janith Lima, MD Taking Active   TURMERIC PO [id number] Yes Take 1,000 mg by mouth in the morning. [provider] Taking Active Self            Patient Active Problem List   Diagnosis Date Noted   S/P lumbar laminectomy 05/02/2021   Leg swelling 03/15/2021   Jet lag syndrome 05/16/2020   Status post total right knee replacement 08/17/2019   OAB (overactive bladder) 06/22/2019   ILD (interstitial lung disease) ([place]) 12/15/2018   Enlarged heart 06/16/2018   Morbid obesity ([place]) 10/17/2017   Functional belching disorder 12/31/2016   Hot flashes, menopausal 12/31/2016   DDD (degenerative disc disease), lumbar 11/06/2015   Right-sided low back pain without sciatica 94/85/4627   Diastolic dysfunction without heart failure 11/13/2014   Other abnormal glucose 05/27/2013   Osteopenia of the elderly 05/27/2013   Hyperlipidemia LDL goal <130 09/04/2012   Esophageal stricture 09/12/2011   Visit for screening mammogram 08/08/2011   Routine general medical examination at a health care facility 08/08/2011   Essential hypertension, benign 01/14/2011   URINARY INCONTINENCE 12/14/2008   OA (osteoarthritis) 07/21/2008   Obstructive sleep apnea 10/14/2007   GERD 10/14/2007    Immunization History  Administered Date(s) Administered   Fluad Quad(high Dose 65+) 09/10/2019, 08/17/2020   Influenza Split 09/04/2012   Influenza Whole 08/30/2009, 09/18/2010, 08/08/2011   Influenza, High Dose Seasonal PF 08/30/2014, 09/02/2016, 09/11/2017, 09/22/2018   Influenza,inj,Quad PF,6+ Mos 09/02/2013, 09/01/2015   Pneumococcal Conjugate-13 01/01/2018   Pneumococcal Polysaccharide-23 05/19/2019   Td 04/06/2010   Tdap 07/19/2019   Zoster Recombinat (Shingrix) 12/11/2019, 03/10/2020    Zoster, Live 09/04/2012    Conditions to be addressed/monitored:  Hypertension, Hyperlipidemia, Overactive Bladder and Back pain  Care Plan : [place]  Updates made by Charlton Haws, [place] since 08/14/2021 12:00 AM     Problem: Hypertension, Hyperlipidemia, Overactive Bladder and Back pain   Priority: High     Long-Range Goal: Disease management   Start Date: 01/25/2021  Expected End Date: 07/25/2021  This Visit's Progress: Not on track  Recent Progress: Not on track  Priority: High  Note:   Current Barriers:  Unable to independently monitor therapeutic efficacy Unable to maintain control of HTN  Pharmacist Clinical Goal(s):  Patient will achieve adherence to monitoring guidelines and medication adherence to achieve therapeutic efficacy maintain control of HTN as evidenced by patient report  through collaboration with PharmD and provider.   Interventions: 1:1 collaboration with Pricilla Holm MD regarding development and update of comprehensive plan of care as evidenced by provider attestation and co-signature Inter-disciplinary care team collaboration (see longitudinal plan of care) Comprehensive medication review performed; medication list updated in electronic medical record  Hypertension (BP goal < 140/90) Uncontrolled - BP is high at home, not improved with addition of hydralazine 10 mg TID; pt declines missed doses of medication; she reports pain is actually improved since back surgery; she denies excess salt, caffeine; she is taking NSAIDs (Celebrex) twice a day; of note her BP monitor is < 59 year old and she has no reason to suspect the readings are inaccurate Home BP: 193/91, 184/97, 177/91, 181/93, 167/91 Current regimen:  Irbesartan 300 mg daily HS Hydralazine 10 mg TID Previously tried: amlodipine, Edarbi, losartan, furosemide Interventions: Counseled on proper BP monitoring technique Counseled on factors that can raise BP - NSAIDs, salt,  caffeine, pain, stress Advised patient to bring BP monitor to next PCP visit to ensure accuracy Plan: Increase Hydralazine to  50 mg TID. F/U w/ PCP in 3 weeks.   Hyperlipidemia (LDL goal < 100) Controlled  - LDL is at goal; pt endorses compliance with statin Current regimen:  Rosuvastatin 20 mg daily Interventions: Discussed cholesterol goals and benefits of medications for prevention of heart attack / stroke Counseled that stopping statin would most likely lead to cholesterol rebounding to pre-statin levels Recommend to continue current medication  Overactive bladder Controlled - per patient report medication has helped  Current regimen:  Solifenacin 10 mg daily  Previously tried/failed: tolterodine, Gemtesa (cost), Myrbetriq Interventions: Discussed benefits of medication Recommend to continue current medication  Back pain Controlled - pt reports pain has improved since back surgery; she is not taking any pain medication other than Celebrex;  -Back surgery 05/02/21 Current regimen:  Celecoxib 200 mg BID  Tylenol 650 mg PRN - not using Interventions: Discussed ibuprofen and celecoxib are in same class (NSAIDS) and should not be used together Counseled on NSAIDs risks included increased BP - it is likely that NSAID use is contributing to current high BP Recommended to limit NSAID use as much as possible by alternating with Tylenol whenever possible  Patient Goals/Self-Care Activities - take medications as prescribed -focus on medication adherence by pill box -Check BP daily -Limit Celebrex use as much as possible. Use Tylenol 650 mg - 2 tab twice a day as needed -Start Hydralazine 50 mg 3 times daily -Keep PCP visit 09/04/21       Medication Assistance: None required.  Patient affirms current coverage meets needs.  Compliance/Adherence/Medication fill history: Care Gaps: Vaccines - Covid, influenza Hep C Screening  Star-Rating Drugs: Irbesartan - LF 05/16/21 x 90  ds Rosuvastatin - LF 05/05/21 x 90 ds  Patient's preferred pharmacy is:  [facility], [address] [place] [address] Phone: [phone] Fax: [place], [place] - [address] [place] [address] [place] [address] Phone: [phone] Fax: [phone]  Uses pill box? Yes Pt endorses 100% compliance  We discussed: Current pharmacy is preferred with insurance plan and patient is satisfied with pharmacy services Patient decided to: Continue current medication management strategy  Care Plan and Follow Up Patient Decision:  Patient agrees to Care Plan and Follow-up.  Plan: Telephone follow up appointment with care management team member scheduled for:  2 months  Charlene Brooke, PharmD, [place], CPP Clinical Pharmacist [facility] at [facility] [phone]

## 2021-08-14 NOTE — Patient Instructions (Signed)
Visit Information  Phone number for Pharmacist: [phone]   Goals Addressed             This Visit's Progress    Track and Manage My Blood Pressure-Hypertension       Timeframe:  Long-Range Goal Priority:  High Start Date:      07/24/21                       Expected End Date:    01/24/22                   Follow Up Date Nov 2022   - check blood pressure daily - choose a place to take my blood pressure (home, clinic or office, retail store) - write blood pressure results in a log or diary  -Start hydralazine 50 mg 3 times daily -Bring BP monitor to PCP visit to be callibrated   Why is this important?   You won't feel high blood pressure, but it can still hurt your blood vessels.  High blood pressure can cause heart or kidney problems. It can also cause a stroke.  Making lifestyle changes like losing a little weight or eating less salt will help.  Checking your blood pressure at home and at different times of the day can help to control blood pressure.  If the doctor prescribes medicine remember to take it the way the doctor ordered.  Call the office if you cannot afford the medicine or if there are questions about it.     Notes:         Care Plan : [place]  Updates made by Charlton Haws, RPH since 08/14/2021 12:00 AM     Problem: Hypertension, Hyperlipidemia, Overactive Bladder and Back pain   Priority: High     Long-Range Goal: Disease management   Start Date: 01/25/2021  Expected End Date: 07/25/2021  This Visit's Progress: Not on track  Recent Progress: Not on track  Priority: High  Note:   Current Barriers:  Unable to independently monitor therapeutic efficacy Unable to maintain control of HTN  Pharmacist Clinical Goal(s):  Patient will achieve adherence to monitoring guidelines and medication adherence to achieve therapeutic efficacy maintain control of HTN as evidenced by patient report  through collaboration with PharmD and  provider.   Interventions: 1:1 collaboration with Pricilla Holm MD regarding development and update of comprehensive plan of care as evidenced by provider attestation and co-signature Inter-disciplinary care team collaboration (see longitudinal plan of care) Comprehensive medication review performed; medication list updated in electronic medical record  Hypertension (BP goal < 140/90) Uncontrolled - BP is high at home, not improved with addition of hydralazine 10 mg TID; pt declines missed doses of medication; she reports pain is actually improved since back surgery; she denies excess salt, caffeine; she is taking NSAIDs (Celebrex) twice a day; of note her BP monitor is < 37 year old and she has no reason to suspect the readings are inaccurate Home BP: 193/91, 184/97, 177/91, 181/93, 167/91 Current regimen:  Irbesartan 300 mg daily HS Hydralazine 10 mg TID Previously tried: amlodipine, Edarbi, losartan, furosemide Interventions: Counseled on proper BP monitoring technique Counseled on factors that can raise BP - NSAIDs, salt, caffeine, pain, stress Advised patient to bring BP monitor to next PCP visit to ensure accuracy Plan: Increase Hydralazine to 50 mg TID. F/U w/ PCP in 3 weeks.   Hyperlipidemia (LDL goal < 100) Controlled  - LDL is at  goal; pt endorses compliance with statin Current regimen:  Rosuvastatin 20 mg daily Interventions: Discussed cholesterol goals and benefits of medications for prevention of heart attack / stroke Counseled that stopping statin would most likely lead to cholesterol rebounding to pre-statin levels Recommend to continue current medication  Overactive bladder Controlled - per patient report medication has helped  Current regimen:  Solifenacin 10 mg daily  Previously tried/failed: tolterodine, Gemtesa (cost), Myrbetriq Interventions: Discussed benefits of medication Recommend to continue current medication  Back pain Controlled - pt reports  pain has improved since back surgery; she is not taking any pain medication other than Celebrex;  -Back surgery 05/02/21 Current regimen:  Celecoxib 200 mg BID  Tylenol 650 mg PRN - not using Interventions: Discussed ibuprofen and celecoxib are in same class (NSAIDS) and should not be used together Counseled on NSAIDs risks included increased BP - it is likely that NSAID use is contributing to current high BP Recommended to limit NSAID use as much as possible by alternating with Tylenol whenever possible  Patient Goals/Self-Care Activities - take medications as prescribed -focus on medication adherence by pill box -Check BP daily -Limit Celebrex use as much as possible. Use Tylenol 650 mg - 2 tab twice a day as needed -Start Hydralazine 50 mg 3 times daily -Keep PCP visit 09/04/21       The patient verbalized understanding of instructions, educational materials, and care plan provided today and declined offer to receive copy of patient instructions, educational materials, and care plan.  Telephone follow up appointment with pharmacy team member scheduled for: 2 months  Charlene Brooke, PharmD, [place], CPP Clinical Pharmacist [facility] at [facility] [phone]

## 2021-08-22 ENCOUNTER — Encounter: Payer: Self-pay | Admitting: Internal Medicine

## 2021-08-22 ENCOUNTER — Other Ambulatory Visit: Payer: Self-pay

## 2021-08-22 ENCOUNTER — Ambulatory Visit (INDEPENDENT_AMBULATORY_CARE_PROVIDER_SITE_OTHER): Payer: Medicare Other

## 2021-08-22 ENCOUNTER — Ambulatory Visit (INDEPENDENT_AMBULATORY_CARE_PROVIDER_SITE_OTHER): Payer: Medicare Other | Admitting: Internal Medicine

## 2021-08-22 VITALS — BP 146/92 | HR 83 | Temp 98.3°F | Resp 16 | Ht 59.0 in | Wt 209.0 lb

## 2021-08-22 DIAGNOSIS — R739 Hyperglycemia, unspecified: Secondary | ICD-10-CM | POA: Insufficient documentation

## 2021-08-22 DIAGNOSIS — E785 Hyperlipidemia, unspecified: Secondary | ICD-10-CM | POA: Diagnosis not present

## 2021-08-22 DIAGNOSIS — I10 Essential (primary) hypertension: Secondary | ICD-10-CM | POA: Diagnosis not present

## 2021-08-22 DIAGNOSIS — R058 Other specified cough: Secondary | ICD-10-CM

## 2021-08-22 DIAGNOSIS — J22 Unspecified acute lower respiratory infection: Secondary | ICD-10-CM | POA: Diagnosis not present

## 2021-08-22 DIAGNOSIS — D539 Nutritional anemia, unspecified: Secondary | ICD-10-CM

## 2021-08-22 DIAGNOSIS — R059 Cough, unspecified: Secondary | ICD-10-CM | POA: Diagnosis not present

## 2021-08-22 DIAGNOSIS — I517 Cardiomegaly: Secondary | ICD-10-CM | POA: Diagnosis not present

## 2021-08-22 LAB — LIPID PANEL
Cholesterol: 147 mg/dL (ref 0–200)
HDL: 56 mg/dL (ref >39.00)
LDL Cholesterol: 60 mg/dL (ref 0–99)
NonHDL: 91.08
Total CHOL/HDL Ratio: 3
Triglycerides: 154 mg/dL — ABNORMAL HIGH (ref 0.0–149.0)
VLDL: 30.8 mg/dL (ref 0.0–40.0)

## 2021-08-22 LAB — CBC WITH DIFFERENTIAL/PLATELET
Basophils Absolute: 0 10*3/uL (ref 0.0–0.1)
Basophils Relative: 0.3 % (ref 0.0–3.0)
Eosinophils Absolute: 0 10*3/uL (ref 0.0–0.7)
Eosinophils Relative: 0.1 % (ref 0.0–5.0)
HCT: 38 % (ref 36.0–46.0)
Hemoglobin: 12.7 g/dL (ref 12.0–15.0)
Lymphocytes Relative: 14.1 % (ref 12.0–46.0)
Lymphs Abs: 1.2 10*3/uL (ref 0.7–4.0)
MCHC: 33.4 g/dL (ref 30.0–36.0)
MCV: 91.4 fl (ref 78.0–100.0)
Monocytes Absolute: 0.7 10*3/uL (ref 0.1–1.0)
Monocytes Relative: 7.5 % (ref 3.0–12.0)
Neutro Abs: 6.9 10*3/uL (ref 1.4–7.7)
Neutrophils Relative %: 78 % — ABNORMAL HIGH (ref 43.0–77.0)
Platelets: 203 10*3/uL (ref 150.0–400.0)
RBC: 4.15 Mil/uL (ref 3.87–5.11)
RDW: 14.8 % (ref 11.5–15.5)
WBC: 8.8 10*3/uL (ref 4.0–10.5)

## 2021-08-22 LAB — VITAMIN B12: Vitamin B-12: 578 pg/mL (ref 211–911)

## 2021-08-22 LAB — IBC + FERRITIN
Ferritin: 153.9 ng/mL (ref 10.0–291.0)
Iron: 62 ug/dL (ref 42–145)
Saturation Ratios: 19.6 % — ABNORMAL LOW (ref 20.0–50.0)
TIBC: 316.4 ug/dL (ref 250.0–450.0)
Transferrin: 226 mg/dL (ref 212.0–360.0)

## 2021-08-22 LAB — BASIC METABOLIC PANEL
BUN: 20 mg/dL (ref 6–23)
CO2: 28 mEq/L (ref 19–32)
Calcium: 9.6 mg/dL (ref 8.4–10.5)
Chloride: 100 mEq/L (ref 96–112)
Creatinine, Ser: 0.87 mg/dL (ref 0.40–1.20)
GFR: 63.64 mL/min (ref >60.00)
Glucose, Bld: 123 mg/dL — ABNORMAL HIGH (ref 70–99)
Potassium: 3.8 mEq/L (ref 3.5–5.1)
Sodium: 137 mEq/L (ref 135–145)

## 2021-08-22 LAB — HEPATIC FUNCTION PANEL
ALT: 74 U/L — ABNORMAL HIGH (ref 0–35)
AST: 30 U/L (ref 0–37)
Albumin: 4.2 g/dL (ref 3.5–5.2)
Alkaline Phosphatase: 227 U/L — ABNORMAL HIGH (ref 39–117)
Bilirubin, Direct: 0.1 mg/dL (ref 0.0–0.3)
Total Bilirubin: 0.6 mg/dL (ref 0.2–1.2)
Total Protein: 7.6 g/dL (ref 6.0–8.3)

## 2021-08-22 LAB — HEMOGLOBIN A1C: Hgb A1c MFr Bld: 6 % (ref 4.6–6.5)

## 2021-08-22 LAB — FOLATE: Folate: 17.9 ng/mL (ref >5.9)

## 2021-08-22 LAB — TSH: TSH: 1.8 u[IU]/mL (ref 0.35–5.50)

## 2021-08-22 MED ORDER — HYDROCODONE BIT-HOMATROP MBR 5-1.5 MG/5ML PO SOLN: 5.0000 mL | Freq: Three times a day (TID) | ORAL | 0 refills | Status: DC | PRN

## 2021-08-22 MED ORDER — CEFDINIR 300 MG PO CAPS
300.0000 mg | ORAL_CAPSULE | Freq: Two times a day (BID) | ORAL | 0 refills | Status: AC
Start: 2021-08-22 — End: 2021-09-01

## 2021-08-22 NOTE — Patient Instructions (Signed)

## 2021-08-22 NOTE — Progress Notes (Signed)
Subjective:  Patient ID: Veronica Garza, female    DOB: [birth year]  Age: 80 y.o. MRN: [id number]  CC: Cough  This visit occurred during the SARS-CoV-2 public health emergency.  Safety protocols were in place, including screening questions prior to the visit, additional usage of staff PPE, and extensive cleaning of exam room while observing appropriate contact time as indicated for disinfecting solutions.    HPI Veronica Garza presents for f/up -  She complains of a 10 day hx of cough productive of yellow phlegm.  Outpatient Medications Prior to Visit  Medication Sig Dispense Refill   acetaminophen (TYLENOL) 650 MG CR tablet Take 650-1,300 mg by mouth every 8 (eight) hours as needed for pain.     Calcium Carb-Cholecalciferol (CALTRATE 600+D3 PO) Take 1 tablet by mouth in the morning.     Calcium Carbonate-Vitamin D 600-400 MG-UNIT tablet Take 1 tablet by mouth in the morning.     celecoxib (CELEBREX) 200 MG capsule Take 200 mg by mouth in the morning and at bedtime.     cyclobenzaprine (FLEXERIL) 10 MG tablet Take 1 tablet (10 mg total) by mouth 2 (two) times daily as needed for muscle spasms. 20 tablet 0   gabapentin (NEURONTIN) 300 MG capsule Take 1 capsule (300 mg total) by mouth 2 (two) times daily. 60 capsule 0   hydrALAZINE (APRESOLINE) 50 MG tablet Take 1 tablet (50 mg total) by mouth 3 (three) times daily. 270 tablet 0   irbesartan (AVAPRO) 300 MG tablet Take 1 tablet (300 mg total) by mouth every evening. 90 tablet 1   methocarbamol (ROBAXIN) 500 MG tablet Take 1 tablet (500 mg total) by mouth 4 (four) times daily. 45 tablet 0   Multiple Vitamins-Minerals (MULTIVITAMIN WITH MINERALS) tablet Take 1 tablet by mouth in the morning. Centrum     Multiple Vitamins-Minerals (PRESERVISION AREDS 2 PO) Take 1 capsule by mouth 2 (two) times daily.     omeprazole (PRILOSEC) 40 MG capsule TAKE ONE CAPSULE BY MOUTH DAILY (Patient taking differently: Take 40 mg by mouth in the morning.) 30  capsule 11   PARoxetine (PAXIL) 10 MG tablet TAKE ONE TABLET BY MOUTH DAILY 90 tablet 1   Propylene Glycol (SYSTANE BALANCE) 0.6 % SOLN Place 1 drop into both eyes 3 (three) times daily as needed (dry eyes).     rosuvastatin (CRESTOR) 20 MG tablet Take 1 tablet (20 mg total) by mouth in the morning. 90 tablet 1   solifenacin (VESICARE) 10 MG tablet Take 1 tablet (10 mg total) by mouth daily. (Patient taking differently: Take 10 mg by mouth every evening.) 90 tablet 1   TURMERIC PO Take 1,000 mg by mouth in the morning.     No facility-administered medications prior to visit.    ROS Review of Systems  Constitutional:  Negative for chills, diaphoresis, fatigue and fever.  HENT: Negative.  Negative for sore throat.   Eyes: Negative.   Respiratory:  Positive for cough. Negative for chest tightness and wheezing.   Cardiovascular:  Negative for chest pain, palpitations and leg swelling.  Gastrointestinal:  Negative for abdominal pain, diarrhea, nausea and vomiting.  Endocrine: Negative.   Genitourinary: Negative.  Negative for difficulty urinating.  Musculoskeletal: Negative.   Skin:  Negative for rash.  Neurological: Negative.  Negative for weakness.  Hematological:  Negative for adenopathy. Does not bruise/bleed easily.  Psychiatric/Behavioral: Negative.     Objective:  BP (!) 146/92 (BP Location: Left Arm, Patient Position: Sitting, Cuff Size: Large)  Pulse 83   Temp 98.3 F (36.8 C) (Oral)   Resp 16   Ht 4\' 11"  (1.499 m)   Wt 209 lb (94.8 kg)   SpO2 96%   BMI 42.21 kg/m   BP Readings from Last 3 Encounters:  08/22/21 (!) 146/92  05/03/21 (!) 174/72  04/26/21 (!) 150/73    Wt Readings from Last 3 Encounters:  08/22/21 209 lb (94.8 kg)  05/02/21 211 lb 3.2 oz (95.8 kg)  04/26/21 211 lb 4 oz (95.8 kg)    Physical Exam Vitals reviewed.  Constitutional:      Appearance: She is not ill-appearing.  HENT:     Nose: Nose normal.     Mouth/Throat:     Mouth: Mucous  membranes are moist.  Eyes:     Conjunctiva/sclera: Conjunctivae normal.  Cardiovascular:     Rate and Rhythm: Normal rate and regular rhythm.     Heart sounds: Normal heart sounds, S1 normal and S2 normal. No murmur heard.   No gallop.  Pulmonary:     Effort: Pulmonary effort is normal. No tachypnea or respiratory distress.     Breath sounds: No stridor or decreased air movement. Examination of the left-middle field reveals rhonchi. Examination of the right-lower field reveals rhonchi. Rhonchi present. No decreased breath sounds, wheezing or rales.  Abdominal:     General: Abdomen is flat.     Palpations: There is no mass.     Tenderness: There is no abdominal tenderness. There is no guarding.     Hernia: No hernia is present.  Musculoskeletal:     Cervical back: Neck supple.     Right lower leg: No edema.     Left lower leg: No edema.  Lymphadenopathy:     Cervical: No cervical adenopathy.  Skin:    General: Skin is warm and dry.  Neurological:     General: No focal deficit present.     Mental Status: She is alert.  Psychiatric:        Mood and Affect: Mood normal.        Behavior: Behavior normal.    Lab Results  Component Value Date   WBC 10.3 04/26/2021   HGB 11.4 (L) 04/26/2021   HCT 34.3 (L) 04/26/2021   PLT 174 04/26/2021   GLUCOSE 123 (H) 04/26/2021   CHOL 159 08/17/2020   TRIG 161 (H) 08/17/2020   HDL 69 08/17/2020   LDLDIRECT 119.5 09/04/2012   LDLCALC 66 08/17/2020   ALT 12 03/15/2021   AST 16 03/15/2021   NA 137 04/26/2021   K 3.5 04/26/2021   CL 103 04/26/2021   CREATININE 0.80 04/26/2021   BUN 16 04/26/2021   CO2 27 04/26/2021   TSH 1.79 08/17/2020   INR 1.1 04/26/2021   HGBA1C 5.6 08/17/2020    DG Lumbar Spine 2-3 Views  Result Date: 05/02/2021 CLINICAL DATA:  Intraoperative localization. EXAM: LUMBAR SPINE - 2-3 VIEW COMPARISON:  Lumbar spine radiographs-03/19/2021 FINDINGS: Two spot intraoperative lateral projection radiographic images of the  lower lumbar spine are provided for review. Spinal labeling is in keeping with preprocedural lumbar spine radiographs performed 03/19/2021 Radiograph #1 demonstrates a radiopaque marking instrument posterior to the L5 vertebral body. Radiograph #2 demonstrates radiopaque marking instrument posterior to the L4-L5 intervertebral disc space with associated adjacent radiopaque surgical support apparatus. IMPRESSION: Intraoperative localization of L4-L5 as above. Electronically Signed   By: Sandi Mariscal M.D.   On: 05/02/2021 10:25   DG Chest 2 View  Result Date:  08/22/2021 CLINICAL DATA:  80 year old female with productive cough for 10 days. EXAM: CHEST - 2 VIEW COMPARISON:  Chest radiographs 04/26/2021 and earlier. FINDINGS: Stable chronic cardiomegaly. Other mediastinal contours are within normal limits. Visualized tracheal air column is within normal limits. Stable somewhat low lung volumes since 2019. No pneumothorax, pulmonary edema, pleural effusion or confluent pulmonary opacity. Chronic bilateral shoulder arthroplasty. No acute osseous abnormality identified. Negative visible bowel gas pattern. IMPRESSION: Stable cardiomegaly. No acute cardiopulmonary abnormality. Electronically Signed   By: Genevie Ann M.D.   On: 08/22/2021 09:57     Assessment & Plan:   Veronica Garza was seen today for cough.  Diagnoses and all orders for this visit:  Essential hypertension, benign- Her BP is adequately well controlled. -     Basic metabolic panel; Future -     TSH; Future -     Hepatic function panel; Future  Hyperglycemia -     Basic metabolic panel; Future  Deficiency anemia -     CBC with Differential/Platelet; Future -     Vitamin B12; Future -     IBC + Ferritin; Future -     Folate; Future -     Reticulocytes; Future -     Vitamin B1; Future  Hyperlipidemia LDL goal <130- LDL goal achieved. Doing well on the statin  -     Lipid panel; Future -     TSH; Future -     Hepatic function panel;  Future  Chronic hyperglycemia -     Basic metabolic panel; Future -     Hemoglobin A1c; Future  Cough productive of yellow sputum -     DG Chest 2 View; Future -     HYDROcodone bit-homatropine (HYCODAN) 5-1.5 MG/5ML syrup; Take 5 mLs by mouth every 8 (eight) hours as needed for cough.  LRTI (lower respiratory tract infection) -     cefdinir (OMNICEF) 300 MG capsule; Take 1 capsule (300 mg total) by mouth 2 (two) times daily for 10 days. -     HYDROcodone bit-homatropine (HYCODAN) 5-1.5 MG/5ML syrup; Take 5 mLs by mouth every 8 (eight) hours as needed for cough.  I am having Veronica Garza start on cefdinir and HYDROcodone bit-homatropine. I am also having her maintain her Calcium Carbonate-Vitamin D, TURMERIC PO, multivitamin with minerals, Multiple Vitamins-Minerals (PRESERVISION AREDS 2 PO), Systane Balance, omeprazole, celecoxib, gabapentin, solifenacin, cyclobenzaprine, Calcium Carb-Cholecalciferol (CALTRATE 600+D3 PO), acetaminophen, methocarbamol, rosuvastatin, irbesartan, PARoxetine, and hydrALAZINE.  Meds ordered this encounter  Medications   cefdinir (OMNICEF) 300 MG capsule    Sig: Take 1 capsule (300 mg total) by mouth 2 (two) times daily for 10 days.    Dispense:  20 capsule    Refill:  0   HYDROcodone bit-homatropine (HYCODAN) 5-1.5 MG/5ML syrup    Sig: Take 5 mLs by mouth every 8 (eight) hours as needed for cough.    Dispense:  120 mL    Refill:  0     Follow-up: Return in about 3 weeks (around 09/12/2021).  Scarlette Calico, MD

## 2021-08-23 ENCOUNTER — Other Ambulatory Visit: Payer: Self-pay | Admitting: Internal Medicine

## 2021-08-23 DIAGNOSIS — N3281 Overactive bladder: Secondary | ICD-10-CM

## 2021-08-26 LAB — RETICULOCYTES
ABS Retic: 72250 cells/uL (ref 20000–80000)
Retic Ct Pct: 1.7 %

## 2021-08-26 LAB — VITAMIN B1: Vitamin B1 (Thiamine): 29 nmol/L (ref 8–30)

## 2021-08-31 DIAGNOSIS — I10 Essential (primary) hypertension: Secondary | ICD-10-CM

## 2021-08-31 DIAGNOSIS — E785 Hyperlipidemia, unspecified: Secondary | ICD-10-CM | POA: Diagnosis not present

## 2021-09-03 ENCOUNTER — Ambulatory Visit: Payer: Medicare Other | Admitting: Internal Medicine

## 2021-09-04 ENCOUNTER — Ambulatory Visit: Payer: Medicare Other | Admitting: Internal Medicine

## 2021-09-06 ENCOUNTER — Ambulatory Visit (INDEPENDENT_AMBULATORY_CARE_PROVIDER_SITE_OTHER): Payer: Medicare Other

## 2021-09-06 ENCOUNTER — Other Ambulatory Visit: Payer: Self-pay

## 2021-09-06 DIAGNOSIS — E785 Hyperlipidemia, unspecified: Secondary | ICD-10-CM

## 2021-09-06 DIAGNOSIS — G8929 Other chronic pain: Secondary | ICD-10-CM

## 2021-09-06 DIAGNOSIS — I10 Essential (primary) hypertension: Secondary | ICD-10-CM

## 2021-09-06 NOTE — Patient Instructions (Signed)
Veronica Garza,  It was great to talk to you today!  Please call me with any questions or concerns.  Visit Information  PATIENT GOALS:  Goals Addressed             This Visit's Progress    Manage My Medicine   On track    Timeframe:  Long-Range Goal Priority:  Medium Start Date:     04/10/21                        Expected End Date:   10/31/21                    Follow Up Date Sept 2022   - call for medicine refill 2 or 3 days before it runs out - call if I am sick and can't take my medicine - keep a list of all the medicines I take; vitamins and herbals too - use a pillbox to sort medicine  -Limit celecoxib as much as possible; try alternating with Tylenol   Why is this important?   These steps will help you keep on track with your medicines.      Track and Manage My Blood Pressure-Hypertension   On track    Timeframe:  Long-Range Goal Priority:  High Start Date:      07/24/21                       Expected End Date:    01/24/22                   Follow Up Date Nov 2022   - check blood pressure daily - choose a place to take my blood pressure (home, clinic or office, retail store) - write blood pressure results in a log or diary  -Start hydralazine 50 mg 3 times daily -Bring BP monitor to PCP visit to be callibrated   Why is this important?   You won't feel high blood pressure, but it can still hurt your blood vessels.  High blood pressure can cause heart or kidney problems. It can also cause a stroke.  Making lifestyle changes like losing a little weight or eating less salt will help.  Checking your blood pressure at home and at different times of the day can help to control blood pressure.  If the doctor prescribes medicine remember to take it the way the doctor ordered.  Call the office if you cannot afford the medicine or if there are questions about it.     Notes:         The patient verbalized understanding of instructions, educational materials, and  care plan provided today and declined offer to receive copy of patient instructions, educational materials, and care plan.   Telephone follow up appointment with care management team member scheduled for: 6 weeks  The patient has been provided with contact information for the care management team and has been advised to call with any health related questions or concerns.  Next PCP appointment scheduled for: 09/12/2021  Tomasa Blase, PharmD Clinical Pharmacist, [place]

## 2021-09-06 NOTE — Progress Notes (Signed)
SPLIT IRBESARTAN DOSING IF BP REMAINS ELEVATED    Chronic Care Management Pharmacy Note  09/06/2021 Name:  Veronica Garza MRN:  [id number] DOB:  [birth year]  Summary: - Patient reports that BP has improved since increased hydralazine to 36m TID - now averaging 130-140/70-90's  -Patient reports to orthostatic hypotension - subsides after a few moments, uses cane/ assistance when waking (no change from baseline) -Patient still recovering from recent bronchitis infection - feeling better but still regaining her voice  -Noted on last labs that LFTs were elevated, alkaline phosphatase at 227 and ALT at 74 - has follow up with PCP next week -Has stopped celebrex at this time, taking APAP 6567m- 2 tablets a bedtime - notes that her arthritis has been bothering her more as of late   Recommendations/Changes made from today's visit: - Recommending for patient to continue current BP regimen at this time, continue to monitor BP, will reach out should she have issues with hypotension -Advised for patient to limit APAP to 200017maily - will plan to have LFTs rechecked with PCP visit next week - possible that should LFTs continue to increase that patient would need to hold rosuvastatin -Advised for patient to trial voltaren 1% gel - 2g applied to affected area(s) up to 4 times daily for better relief of her arthritis pain   Subjective: Veronica Garza an [age]o. year old female who is a primary patient of Dr ThoScarlette Calicohe CCM team was consulted for assistance with disease management and care coordination needs.    Engaged with patient by telephone for follow up visit in response to provider referral for pharmacy case management and/or care coordination services.   Consent to Services:  The patient was given information about Chronic Care Management services, agreed to services, and gave verbal consent prior to initiation of services.  Please see initial visit note for detailed documentation.    Patient Care Team: JonJanith LimaD as PCP - General AlvRigoberto NoelD as Consulting Physician (Pulmonary Disease) NanMauri PoleD as Consulting Physician (Gastroenterology) [facility] Pharmacist (Pharmacist)  Recent office visits: 08/22/2021 - ThoScarlette Calicoseen for cough / phlegm  - started on cefdinir and hydrocodone bit-homatropine  Recent consult visits: 01/18/21 Dr RamNelva Bushain mgmt): injection for back pain given.  Hospital visits: Medication Reconciliation was completed by comparing discharge summary, patient's EMR and Pharmacy list, and upon discussion with patient.  Admitted to the hospital on 05/02/21 due to Spinal surgery. Discharge date was 05/03/21 [facility] -started hydrocodone/APAP and methocarbamol  Medications that remain the same after Hospital Discharge:??  -All other medications will remain the same.    Admitted to the ED on 03/19/21 due to back pain. [facility]21[facility]  Given Fentanyl and diazepam in ED.  [facility] -started cyclobenzaprine 10 mg BID and Medrol dosepak due to back pain  Medications that remain the same after Hospital Discharge:??  -All other medications will remain the same.    Objective:  Lab Results  Component Value Date   CREATININE 0.87 08/22/2021   BUN 20 08/22/2021   GFR 63.64 08/22/2021   GFRNONAA >60 04/26/2021   GFRAA 71 08/17/2020   NA 137 08/22/2021   K 3.8 08/22/2021   CALCIUM 9.6 08/22/2021   CO2 28 08/22/2021    Lab Results  Component Value Date/Time  HGBA1C 6.0 08/22/2021 10:16 AM   HGBA1C 5.6 08/17/2020 03:45 PM   GFR 63.64 08/22/2021 10:16 AM   GFR 54.66 (L) 03/15/2021 10:04 AM    Last diabetic Eye exam: No results found for: HMDIABEYEEXA  Last diabetic Foot exam: No results found for: HMDIABFOOTEX   Lab Results  Component  Value Date   CHOL 147 08/22/2021   HDL 56.00 08/22/2021   LDLCALC 60 08/22/2021   LDLDIRECT 119.5 09/04/2012   TRIG 154.0 (H) 08/22/2021   CHOLHDL 3 08/22/2021    Hepatic Function Latest Ref Rng & Units 08/22/2021 03/15/2021 08/17/2020  Total Protein 6.0 - 8.3 g/dL 7.6 6.7 7.2  Albumin 3.5 - 5.2 g/dL 4.2 3.8 -  AST 0 - 37 U/L _0 ALT 0 - 35 U/L 74(H) 12 20  Alk Phosphatase 39 - 117 U/L 227(H) 88 -  Total Bilirubin 0.2 - 1.2 mg/dL 0.6 0.6 0.7  Bilirubin, Direct 0.0 - 0.3 mg/dL 0.1 - 0.2    Lab Results  Component Value Date/Time   TSH 1.80 08/22/2021 10:16 AM   TSH 1.79 08/17/2020 03:45 PM    CBC Latest Ref Rng & Units 08/22/2021 04/26/2021 03/19/2021  WBC 4.0 - 10.5 K/uL 8.8 10.3 5.7  Hemoglobin 12.0 - 15.0 g/dL 12.7 11.4(L) 11.2(L)  Hematocrit 36.0 - 46.0 % 38.0 34.3(L) 33.1(L)  Platelets 150.0 - 400.0 K/uL 203.0 174 179    No results found for: VD25OH  Clinical ASCVD: No  The 10-year ASCVD risk score (Arnett DK, et al., 2019) is: 35.5%   Values used to calculate the score:     Age: 67 years     Sex: Female     Is Non-Hispanic African American: No     Diabetic: No     Tobacco smoker: No     Systolic Blood Pressure: 480 mmHg     Is BP treated: Yes     HDL Cholesterol: 56 mg/dL     Total Cholesterol: 147 mg/dL    Depression screen [facility] 2/9 08/22/2021 08/17/2020 06/22/2019  Decreased Interest 0 0 0  Down, Depressed, Hopeless 0 0 0  PHQ - 2 Score 0 0 0  Altered sleeping - - -  Tired, decreased energy - - -  Change in appetite - - -  Feeling bad or failure about yourself  - - -  Trouble concentrating - - -  Moving slowly or fidgety/restless - - -  Suicidal thoughts - - -  PHQ-9 Score - - -  Difficult doing work/chores - - -  Some recent data might be hidden    Social History   Tobacco Use  Smoking Status Never  Smokeless Tobacco Never   BP Readings from Last 3 Encounters:  08/22/21 (!) 146/92  05/03/21 (!) 174/72  04/26/21 (!) 150/73   Pulse Readings  from Last 3 Encounters:  08/22/21 83  05/03/21 62  04/26/21 77   Wt Readings from Last 3 Encounters:  08/22/21 209 lb (94.8 kg)  05/02/21 211 lb 3.2 oz (95.8 kg)  04/26/21 211 lb 4 oz (95.8 kg)   BMI Readings from Last 3 Encounters:  08/22/21 42.21 kg/m  05/02/21 42.66 kg/m  04/26/21 42.67 kg/m     Assessment/Interventions: Review of patient past medical history, allergies, medications, health status, including review of consultants reports, laboratory and other test data, was performed as part of comprehensive evaluation and provision of chronic care management services.   SDOH:  (Social Determinants of Health) assessments and interventions performed: Yes  SDOH Screenings   Alcohol Screen: Not on file  Depression (PHQ2-9): Low Risk    PHQ-2 Score: 0  Financial Resource Strain: Not on file  Food Insecurity: Not on file  Housing: Not on file  Physical Activity: Not on file  Social Connections: Not on file  Stress: Not on file  Tobacco Use: Low Risk    Smoking Tobacco Use: Never   Smokeless Tobacco Use: Never  Transportation Needs: Not on file    CCM Care Plan  Allergies  Allergen Reactions   Tolterodine Other (See Comments)    dizziness   Iohexol Hives     Code: HIVES, Desc: ? Contrast reaction from CT prior to 2000.    Naproxen Itching and Rash   Neomycin-Bacitracin Zn-Polymyx Itching and Rash    REACTION: redness   Sulfonamide Derivatives Itching and Rash    tingling    Medications Reviewed Today     Reviewed by Tomasa Blase, [facility] (Pharmacist) on 09/06/21 at [place] List Status: <None>   Medication Order Taking? Sig Documenting Provider Last Dose Status Informant  acetaminophen (TYLENOL) 650 MG CR tablet [id number] Yes Take 650-1,300 mg by mouth every 8 (eight) hours as needed for pain. [provider] Taking Active Self  Calcium Carb-Cholecalciferol (CALTRATE 600+D3 PO) [id number] Yes Take 1 tablet by mouth in the morning and at bedtime.  [provider] Taking Active Self  celecoxib (CELEBREX) 200 MG capsule [id number] No Take 200 mg by mouth in the morning and at bedtime.  Patient not taking: Reported on 09/06/2021   [provider] Not Taking Active Self  gabapentin (NEURONTIN) 300 MG capsule [id number] Yes Take 1 capsule (300 mg total) by mouth 2 (two) times daily. Hoyt Koch, MD Taking Active Self  hydrALAZINE (APRESOLINE) 50 MG tablet [id number] Yes Take 1 tablet (50 mg total) by mouth 3 (three) times daily. Janith Lima, MD Taking Active   HYDROcodone bit-homatropine [facility]) 5-1.5 MG/5ML syrup [id number] Yes Take 5 mLs by mouth every 8 (eight) hours as needed for cough. Janith Lima, MD Taking Active   irbesartan (AVAPRO) 300 MG tablet [id number] Yes Take 1 tablet (300 mg total) by mouth every evening. Janith Lima, MD Taking Active   Multiple Vitamins-Minerals (MULTIVITAMIN WITH MINERALS) tablet [id number] Yes Take 1 tablet by mouth in the morning. Centrum [provider] Taking Active Self  Multiple Vitamins-Minerals (PRESERVISION AREDS 2 PO) [id number] Yes Take 1 capsule by mouth 2 (two) times daily. [provider] Taking Active Self  omeprazole (PRILOSEC) 40 MG capsule [id number] Yes TAKE ONE CAPSULE BY MOUTH DAILY  Patient taking differently: Take 40 mg by mouth in the morning.   Mauri Pole, MD Taking Active   PARoxetine (PAXIL) 10 MG tablet [id number] Yes TAKE ONE TABLET BY MOUTH DAILY Janith Lima, MD Taking Active   Propylene Glycol (SYSTANE BALANCE) 0.6 % SOLN [id number] Yes Place 1 drop into both eyes 3 (three) times daily as needed (dry eyes). [provider] Taking Active Self  rosuvastatin (CRESTOR) 20 MG tablet [id number] Yes Take 1 tablet (20 mg total) by mouth in the morning. Janith Lima, MD Taking Active   solifenacin (VESICARE) 10 MG tablet [id number] Yes Take 1 tablet (10 mg total) by mouth daily. Janith Lima, MD Taking Active    TURMERIC PO [id number] Yes Take 1,000 mg by mouth in the morning. [provider] Taking Active Self            Patient  Active Problem List   Diagnosis Date Noted   Hyperglycemia 08/22/2021   Deficiency anemia 08/22/2021   Chronic hyperglycemia 08/22/2021   Cough productive of yellow sputum 08/22/2021   LRTI (lower respiratory tract infection) 08/22/2021   Jet lag syndrome 05/16/2020   Status post total right knee replacement 08/17/2019   OAB (overactive bladder) 06/22/2019   ILD (interstitial lung disease) ([place]) 12/15/2018   Enlarged heart 06/16/2018   Morbid obesity ([place]) 10/17/2017   Functional belching disorder 12/31/2016   Hot flashes, menopausal 12/31/2016   DDD (degenerative disc disease), lumbar 11/06/2015   Right-sided low back pain without sciatica 74/11/8785   Diastolic dysfunction without heart failure 11/13/2014   Osteopenia of the elderly 05/27/2013   Hyperlipidemia LDL goal <130 09/04/2012   Esophageal stricture 09/12/2011   Visit for screening mammogram 08/08/2011   Routine general medical examination at a health care facility 08/08/2011   Essential hypertension, benign 01/14/2011   URINARY INCONTINENCE 12/14/2008   OA (osteoarthritis) 07/21/2008   Obstructive sleep apnea 10/14/2007   GERD 10/14/2007    Immunization History  Administered Date(s) Administered   Fluad Quad(high Dose 65+) 09/10/2019, 08/17/2020   Influenza Split 09/04/2012   Influenza Whole 08/30/2009, 09/18/2010, 08/08/2011   Influenza, High Dose Seasonal PF 08/30/2014, 09/02/2016, 09/11/2017, 09/22/2018   Influenza,inj,Quad PF,6+ Mos 09/02/2013, 09/01/2015   Moderna Sars-Covid-2 Vaccination 12/16/2019, 01/14/2020, 10/07/2020   Pneumococcal Conjugate-13 01/01/2018   Pneumococcal Polysaccharide-23 05/19/2019   Td 04/06/2010   Tdap 07/19/2019   Zoster Recombinat (Shingrix) 12/11/2019, 03/10/2020   Zoster, Live 09/04/2012    Conditions to be addressed/monitored:   Hypertension, Hyperlipidemia, Overactive Bladder and Back pain  Care Plan : [place]  Updates made by Tomasa Blase, RPH since 09/06/2021 12:00 AM     Problem: Hypertension, Hyperlipidemia, Overactive Bladder and Back pain   Priority: High     Long-Range Goal: Disease management   Start Date: 01/25/2021  Expected End Date: 07/25/2021  This Visit's Progress: On track  Recent Progress: Not on track  Priority: High  Note:   Current Barriers:  Unable to independently monitor therapeutic efficacy Unable to maintain control of HTN  Pharmacist Clinical Goal(s):  Patient will achieve adherence to monitoring guidelines and medication adherence to achieve therapeutic efficacy maintain control of HTN as evidenced by patient report  through collaboration with PharmD and provider.   Interventions: 1:1 collaboration with Pricilla Holm MD regarding development and update of comprehensive plan of care as evidenced by provider attestation and co-signature Inter-disciplinary care team collaboration (see longitudinal plan of care) Comprehensive medication review performed; medication list updated in electronic medical record  Hypertension (BP goal < 140/90) Improved- BP now averaging 130-140/70-90 since latest medication change ; she reports pain is actually improved since back surgery; she denies excess salt, caffeine; she has stopped taking NSAIDs (Celebrex) twice a day; of note her BP monitor is < 57 year old and she has no reason to suspect the readings are inaccurate Home BP: 140/74, 130/89, 131/89 Current regimen:  Irbesartan 300 mg daily HS Hydralazine 50 mg TID Previously tried: amlodipine - swelling, Edarbi, losartan, furosemide -excessive urination - pt declines as she has OAB Interventions: Counseled on proper BP monitoring technique Counseled on factors that can raise BP - NSAIDs, salt, caffeine, pain, stress Advised patient to bring BP monitor to next PCP visit  to ensure accuracy  Hyperlipidemia (LDL goal < 100) Controlled  - LDL is at goal; pt endorses compliance with statin Lab Results  Component Value Date  [place] 60 08/22/2021  Current regimen:  Rosuvastatin 20 mg daily Interventions: Discussed cholesterol goals and benefits of medications for prevention of heart attack / stroke Recommend to continue current medication, recheck LFTs with next PCP visit, possible that patient would need to hold statin  Overactive bladder Controlled - per patient report medication has helped  Current regimen:  Solifenacin 10 mg daily  Previously tried/failed: tolterodine, Gemtesa (cost), Myrbetriq Interventions: Discussed benefits of medication Recommend to continue current medication  Back pain Controlled - pt reports pain has improved since back surgery; she is not taking any pain medication other than Celebrex;  -Back surgery 05/02/21 Current regimen:  Tylenol 650 mg PRN - takes 2 tablets at bedtime  Interventions: Recommended to limit APAP usage to 2035m daily (in setting of increased LFTs without cause at this time)  Recommended for patient to trial voltaren 1% gel - 2g QID prn to help keep pain under better control / without the BP elevating effects of PO NSAIDS  Patient Goals/Self-Care Activities - take medications as prescribed -focus on medication adherence by pill box -Check BP daily -Trial voltaren 1% gel 2g QID prn  -Keep PCP visit 09/12/21        Medication Assistance: None required.  Patient affirms current coverage meets needs.  Compliance/Adherence/Medication fill history: Care Gaps: Vaccines - Covid, influenza Hep C Screening  Patient[facility] [address]: [phone]: [id number]: [phone]:  [phone] Uses pill box? Yes Pt endorses 100% compliance  We discussed: Current pharmacy is preferred with insurance plan and patient is satisfied with pharmacy services Patient decided to: Continue current medication management strategy  Care Plan and Follow Up Patient Decision:  Patient agrees to Care Plan and Follow-up.  Plan: Telephone follow up appointment with care management team member scheduled for:  2 months  DTomasa Blase PharmD Clinical Pharmacist, [place]

## 2021-09-12 ENCOUNTER — Other Ambulatory Visit: Payer: Self-pay

## 2021-09-12 ENCOUNTER — Ambulatory Visit (INDEPENDENT_AMBULATORY_CARE_PROVIDER_SITE_OTHER): Payer: Medicare Other | Admitting: Internal Medicine

## 2021-09-12 ENCOUNTER — Encounter: Payer: Self-pay | Admitting: Internal Medicine

## 2021-09-12 VITALS — BP 134/76 | HR 69 | Temp 97.9°F | Resp 16 | Ht 59.0 in | Wt 207.0 lb

## 2021-09-12 DIAGNOSIS — Z23 Encounter for immunization: Secondary | ICD-10-CM | POA: Diagnosis not present

## 2021-09-12 DIAGNOSIS — J45909 Unspecified asthma, uncomplicated: Secondary | ICD-10-CM | POA: Insufficient documentation

## 2021-09-12 DIAGNOSIS — R7989 Other specified abnormal findings of blood chemistry: Secondary | ICD-10-CM | POA: Insufficient documentation

## 2021-09-12 DIAGNOSIS — J4521 Mild intermittent asthma with (acute) exacerbation: Secondary | ICD-10-CM

## 2021-09-12 DIAGNOSIS — R0781 Pleurodynia: Secondary | ICD-10-CM

## 2021-09-12 DIAGNOSIS — R0789 Other chest pain: Secondary | ICD-10-CM

## 2021-09-12 LAB — TROPONIN I (HIGH SENSITIVITY): High Sens Troponin I: 5 ng/L (ref 2–17)

## 2021-09-12 LAB — D-DIMER, QUANTITATIVE: D-Dimer, Quant: 1.38 mcg/mL FEU — ABNORMAL HIGH (ref <0.50)

## 2021-09-12 MED ORDER — METHYLPREDNISOLONE 4 MG PO TBPK: ORAL_TABLET | ORAL | 0 refills | Status: AC

## 2021-09-12 NOTE — Progress Notes (Signed)
d  Subjective:  Patient ID: Veronica Garza, female    DOB: 1941/04/24  Age: 80 y.o. MRN: [id number]  CC: URI  This visit occurred during the SARS-CoV-2 public health emergency.  Safety protocols were in place, including screening questions prior to the visit, additional usage of staff PPE, and extensive cleaning of exam room while observing appropriate contact time as indicated for disinfecting solutions.    HPI Veronica Garza presents for f/up -  She was recently seen for an upper respiratory infection and says her cough is much better.  She now complains of a 3-week history of laryngitis, shortness of breath, and chest tightness.  She denies hemoptysis, palpitations, or edema.  Outpatient Medications Prior to Visit  Medication Sig Dispense Refill   acetaminophen (TYLENOL) 650 MG CR tablet Take 650-1,300 mg by mouth every 8 (eight) hours as needed for pain.     Calcium Carb-Cholecalciferol (CALTRATE 600+D3 PO) Take 1 tablet by mouth in the morning and at bedtime.     celecoxib (CELEBREX) 200 MG capsule Take 200 mg by mouth in the morning and at bedtime.     gabapentin (NEURONTIN) 300 MG capsule Take 1 capsule (300 mg total) by mouth 2 (two) times daily. 60 capsule 0   hydrALAZINE (APRESOLINE) 50 MG tablet Take 1 tablet (50 mg total) by mouth 3 (three) times daily. 270 tablet 0   irbesartan (AVAPRO) 300 MG tablet Take 1 tablet (300 mg total) by mouth every evening. 90 tablet 1   Multiple Vitamins-Minerals (MULTIVITAMIN WITH MINERALS) tablet Take 1 tablet by mouth in the morning. Centrum     Multiple Vitamins-Minerals (PRESERVISION AREDS 2 PO) Take 1 capsule by mouth 2 (two) times daily.     omeprazole (PRILOSEC) 40 MG capsule TAKE ONE CAPSULE BY MOUTH DAILY (Patient taking differently: Take 40 mg by mouth in the morning.) 30 capsule 11   PARoxetine (PAXIL) 10 MG tablet TAKE ONE TABLET BY MOUTH DAILY 90 tablet 1   Propylene Glycol (SYSTANE BALANCE) 0.6 % SOLN Place 1 drop into both eyes 3  (three) times daily as needed (dry eyes).     rosuvastatin (CRESTOR) 20 MG tablet Take 1 tablet (20 mg total) by mouth in the morning. 90 tablet 1   solifenacin (VESICARE) 10 MG tablet Take 1 tablet (10 mg total) by mouth daily. 90 tablet 1   TURMERIC PO Take 1,000 mg by mouth in the morning.     HYDROcodone bit-homatropine (HYCODAN) 5-1.5 MG/5ML syrup Take 5 mLs by mouth every 8 (eight) hours as needed for cough. 120 mL 0   No facility-administered medications prior to visit.    ROS Review of Systems  Constitutional:  Negative for diaphoresis and fatigue.  HENT:  Positive for voice change. Negative for sore throat and trouble swallowing.   Respiratory:  Positive for cough, chest tightness and shortness of breath. Negative for wheezing.   Cardiovascular:  Negative for chest pain.  Gastrointestinal:  Negative for abdominal pain, constipation and diarrhea.  Genitourinary: Negative.  Negative for difficulty urinating, dysuria, urgency and vaginal pain.  Musculoskeletal: Negative.   Skin: Negative.    Objective:  BP 134/76 (BP Location: Right Arm, Patient Position: Sitting, Cuff Size: Large)   Pulse 69   Temp 97.9 F (36.6 C) (Oral)   Resp 16   Ht 4\' 11"  (1.499 m)   Wt 207 lb (93.9 kg)   SpO2 97%   BMI 41.81 kg/m   BP Readings from Last 3 Encounters:  09/12/21 134/76  08/22/21 (!) 146/92  05/03/21 (!) 174/72    Wt Readings from Last 3 Encounters:  09/12/21 207 lb (93.9 kg)  08/22/21 209 lb (94.8 kg)  05/02/21 211 lb 3.2 oz (95.8 kg)    Physical Exam Vitals reviewed.  Constitutional:      Appearance: She is obese.  HENT:     Nose: Nose normal.     Mouth/Throat:     Mouth: Mucous membranes are moist.  Eyes:     Conjunctiva/sclera: Conjunctivae normal.  Cardiovascular:     Rate and Rhythm: Normal rate and regular rhythm.     Heart sounds: No murmur heard.   No friction rub. No gallop.     Comments: EKG- SR with PAC's, 70 bpm LAD No ST/T wave changes or Q  waves unchanged Pulmonary:     Effort: Pulmonary effort is normal.     Breath sounds: No stridor. Examination of the right-lower field reveals rales. Examination of the left-lower field reveals rales. Rales present. No wheezing or rhonchi.  Abdominal:     General: Abdomen is protuberant. Bowel sounds are normal. There is no distension.     Palpations: Abdomen is soft. There is no hepatomegaly, splenomegaly or mass.     Tenderness: There is no abdominal tenderness.  Musculoskeletal:        General: Normal range of motion.     Cervical back: Neck supple.     Right lower leg: No edema.     Left lower leg: No edema.  Lymphadenopathy:     Cervical: No cervical adenopathy.  Skin:    General: Skin is warm and dry.  Neurological:     General: No focal deficit present.     Mental Status: She is alert.    Lab Results  Component Value Date   WBC 8.8 08/22/2021   HGB 12.7 08/22/2021   HCT 38.0 08/22/2021   PLT 203.0 08/22/2021   GLUCOSE 123 (H) 08/22/2021   CHOL 147 08/22/2021   TRIG 154.0 (H) 08/22/2021   HDL 56.00 08/22/2021   LDLDIRECT 119.5 09/04/2012   LDLCALC 60 08/22/2021   ALT 74 (H) 08/22/2021   AST 30 08/22/2021   NA 137 08/22/2021   K 3.8 08/22/2021   CL 100 08/22/2021   CREATININE 0.87 08/22/2021   BUN 20 08/22/2021   CO2 28 08/22/2021   TSH 1.80 08/22/2021   INR 1.1 04/26/2021   HGBA1C 6.0 08/22/2021    DG Lumbar Spine 2-3 Views  Result Date: 05/02/2021 CLINICAL DATA:  Intraoperative localization. EXAM: LUMBAR SPINE - 2-3 VIEW COMPARISON:  Lumbar spine radiographs-03/19/2021 FINDINGS: Two spot intraoperative lateral projection radiographic images of the lower lumbar spine are provided for review. Spinal labeling is in keeping with preprocedural lumbar spine radiographs performed 03/19/2021 Radiograph #1 demonstrates a radiopaque marking instrument posterior to the L5 vertebral body. Radiograph #2 demonstrates radiopaque marking instrument posterior to the L4-L5  intervertebral disc space with associated adjacent radiopaque surgical support apparatus. IMPRESSION: Intraoperative localization of L4-L5 as above. Electronically Signed   By: Sandi Mariscal M.D.   On: 05/02/2021 10:25    Assessment & Plan:   Darshana was seen today for uri.  Diagnoses and all orders for this visit:  Chest tightness- Her D-dimer is mildly elevated.  Will screen for PE with a CT angio. -     Cancel: Pro b natriuretic peptide (BNP); Future -     Troponin I (High Sensitivity); Future -     D-dimer, quantitative; Future -  EKG 12-Lead -     Pro b natriuretic peptide (BNP); Future -     Pro b natriuretic peptide (BNP) -     D-dimer, quantitative -     Troponin I (High Sensitivity) -     CT Angio Chest W/Cm &/Or Wo Cm; Future  Mild intermittent asthmatic bronchitis with acute exacerbation -     methylPREDNISolone (MEDROL DOSEPAK) 4 MG TBPK tablet; TAKE AS DIRECTED  Flu vaccine need -     Flu Vaccine QUAD High Dose(Fluad)  D-dimer, elevated -     CT Angio Chest W/Cm &/Or Wo Cm; Future  Pleurodynia -     CT Angio Chest W/Cm &/Or Wo Cm; Future  I have discontinued Sequoya B. Chatmon's HYDROcodone bit-homatropine. I am also having her start on methylPREDNISolone. Additionally, I am having her maintain her TURMERIC PO, multivitamin with minerals, Multiple Vitamins-Minerals (PRESERVISION AREDS 2 PO), Systane Balance, omeprazole, celecoxib, gabapentin, Calcium Carb-Cholecalciferol (CALTRATE 600+D3 PO), acetaminophen, rosuvastatin, irbesartan, PARoxetine, hydrALAZINE, and solifenacin.  Meds ordered this encounter  Medications   methylPREDNISolone (MEDROL DOSEPAK) 4 MG TBPK tablet    Sig: TAKE AS DIRECTED    Dispense:  21 tablet    Refill:  0      Follow-up: No follow-ups on file.  Scarlette Calico, MD

## 2021-09-13 ENCOUNTER — Encounter: Payer: Self-pay | Admitting: Internal Medicine

## 2021-09-13 LAB — PRO B NATRIURETIC PEPTIDE: NT-Pro BNP: 65 pg/mL (ref 0–738)

## 2021-09-13 NOTE — Patient Instructions (Signed)
Nonspecific Chest Pain, Adult °Chest pain is an uncomfortable, tight, or painful feeling in the chest. The pain can feel like a crushing, aching, or squeezing pressure. A person can feel a burning or tingling sensation. Chest pain can also be felt in your back, neck, jaw, shoulder, or arm. This pain can be worse when you move, sneeze, or take a deep breath. °Chest pain can be caused by a condition that is life-threatening. This must be treated right away. It can also be caused by something that is not life-threatening. If you have chest pain, it can be hard to know the difference, so it is important to get help right away to make sure that you do not have a serious condition. °Some life-threatening causes of chest pain include: °Heart attack. °A tear in the body's main blood vessel (aortic dissection). °Inflammation around your heart (pericarditis). °A problem in the lungs, such as a blood clot (pulmonary embolism) or a collapsed lung (pneumothorax). °Some non life-threatening causes of chest pain include: °Heartburn. °Anxiety or stress. °Damage to the bones, muscles, and cartilage that make up your chest wall. °Pneumonia or bronchitis. °Shingles infection (varicella-zoster virus). °Your chest pain may come and go. It may also be constant. Your health care provider will do tests and other studies to find the cause of your pain. Treatment will depend on the cause of your chest pain. °Follow these instructions at home: °Medicines °Take over-the-counter and prescription medicines only as told by your health care provider. °If you were prescribed an antibiotic medicine, take it as told by your health care provider. Do not stop taking the antibiotic even if you start to feel better. °Activity °Avoid any activities that cause chest pain. °Do not lift anything that is heavier than 10 lb (4.5 kg), or the limit that you are told, until your health care provider says that it is safe. °Rest as directed by your health care  provider. °Return to your normal activities only as told by your health care provider. Ask your health care provider what activities are safe for you. °Lifestyle °  °Do not use any products that contain nicotine or tobacco, such as cigarettes, e-cigarettes, and chewing tobacco. If you need help quitting, ask your health care provider. °Do not drink alcohol. °Make healthy lifestyle changes as recommended. These may include: °Getting regular exercise. Ask your health care provider to suggest some exercises that are safe for you. °Eating a heart-healthy diet. This includes plenty of fresh fruits and vegetables, whole grains, low-fat (lean) protein, and low-fat dairy products. A dietitian can help you find healthy eating options. °Maintaining a healthy weight. °Managing any other health conditions you may have, such as high blood pressure (hypertension) or diabetes. °Reducing stress, such as with yoga or relaxation techniques. °General instructions °Pay attention to any changes in your symptoms. °It is up to you to get the results of any tests that were done. Ask your health care provider, or the department that is doing the tests, when your results will be ready. °Keep all follow-up visits as told by your health care provider. This is important. °You may be asked to go for further testing if your chest pain does not go away. °Contact a health care provider if: °Your chest pain does not go away. °You feel depressed. °You have a fever. °You notice changes in your symptoms or develop new symptoms. °Get help right away if: °Your chest pain gets worse. °You have a cough that gets worse, or you cough up   blood. °You have severe pain in your abdomen. °You faint. °You have sudden, unexplained chest discomfort. °You have sudden, unexplained discomfort in your arms, back, neck, or jaw. °You have shortness of breath at any time. °You suddenly start to sweat, or your skin gets clammy. °You feel nausea or you vomit. °You suddenly  feel lightheaded or dizzy. °You have severe weakness, or unexplained weakness or fatigue. °Your heart begins to beat quickly, or it feels like it is skipping beats. °These symptoms may represent a serious problem that is an emergency. Do not wait to see if the symptoms will go away. Get medical help right away. Call your local emergency services (911 in the [place] Do not drive yourself to the hospital. °Summary °Chest pain can be caused by a condition that is serious and requires urgent treatment. It may also be caused by something that is not life-threatening. °Your health care provider may do lab tests and other studies to find the cause of your pain. °Follow your health care provider's instructions on taking medicines, making lifestyle changes, and getting emergency treatment if symptoms become worse. °Keep all follow-up visits as told by your health care provider. This includes visits for any further testing if your chest pain does not go away. °This information is not intended to replace advice given to you by your health care provider. Make sure you discuss any questions you have with your health care provider. °Document Revised: 02/01/2021 Document Reviewed: 02/01/2021 °Elsevier Patient Education © 2022 [employer] ° °

## 2021-09-25 DIAGNOSIS — H04123 Dry eye syndrome of bilateral lacrimal glands: Secondary | ICD-10-CM | POA: Diagnosis not present

## 2021-09-25 DIAGNOSIS — H524 Presbyopia: Secondary | ICD-10-CM | POA: Diagnosis not present

## 2021-09-25 DIAGNOSIS — H353131 Nonexudative age-related macular degeneration, bilateral, early dry stage: Secondary | ICD-10-CM | POA: Diagnosis not present

## 2021-09-25 DIAGNOSIS — Z961 Presence of intraocular lens: Secondary | ICD-10-CM | POA: Diagnosis not present

## 2021-10-01 DIAGNOSIS — E785 Hyperlipidemia, unspecified: Secondary | ICD-10-CM | POA: Diagnosis not present

## 2021-10-01 DIAGNOSIS — I10 Essential (primary) hypertension: Secondary | ICD-10-CM | POA: Diagnosis not present

## 2021-10-17 ENCOUNTER — Ambulatory Visit (INDEPENDENT_AMBULATORY_CARE_PROVIDER_SITE_OTHER): Payer: Medicare Other

## 2021-10-17 ENCOUNTER — Other Ambulatory Visit: Payer: Self-pay

## 2021-10-17 DIAGNOSIS — I10 Essential (primary) hypertension: Secondary | ICD-10-CM

## 2021-10-17 DIAGNOSIS — E785 Hyperlipidemia, unspecified: Secondary | ICD-10-CM

## 2021-10-17 NOTE — Progress Notes (Signed)
Chronic Care Management Pharmacy Note  10/17/2021 Name:  Veronica Garza MRN:  [id number] DOB:  08/13/1941  Summary: - Patient reports that she has been doing well since last appointment with PCP, has finished medrol dosepak and states she is feeling much better -BP at home as been ranging from 130-140's/60-70s - denies any symptoms of hypotension -Patient reports to recent issues with constipation which have been relieved through use of miralax daily  -D-dimer elevated with most recent labs, CT scan ordered after last PCP OV has not yet been completed  Recommendations/Changes made from today's visit: - Recommending for patient to continue current BP regimen at this time, continue to monitor BP, will reach out should she have issues with hypotension -Advised for patient to complete CT scan ordered with last PCP visit, patient will call office to schedule    Subjective: Veronica Garza is an 80 y.o. year old female who is a primary patient of Dr Scarlette Calico. The CCM team was consulted for assistance with disease management and care coordination needs.    Engaged with patient by telephone for follow up visit in response to provider referral for pharmacy case management and/or care coordination services.   Consent to Services:  The patient was given information about Chronic Care Management services, agreed to services, and gave verbal consent prior to initiation of services.  Please see initial visit note for detailed documentation.   Patient Care Team: Janith Lima, MD as PCP - General Rigoberto Noel, MD as Consulting Physician (Pulmonary Disease) Mauri Pole, MD as Consulting Physician (Gastroenterology) Charlton Haws, [facility] as Pharmacist (Pharmacist)  Recent office visits: 09/12/2021 - Dr. Ronnald Ramp - bronchitis - started medrol dosepak   Recent consult visits: None since last visit   Hospital visits: None since last visit    Objective:  Lab Results   Component Value Date   CREATININE 0.87 08/22/2021   BUN 20 08/22/2021   GFR 63.64 08/22/2021   GFRNONAA >60 04/26/2021   GFRAA 71 08/17/2020   NA 137 08/22/2021   K 3.8 08/22/2021   CALCIUM 9.6 08/22/2021   CO2 28 08/22/2021    Lab Results  Component Value Date/Time   HGBA1C 6.0 08/22/2021 10:16 AM   HGBA1C 5.6 08/17/2020 03:45 PM   GFR 63.64 08/22/2021 10:16 AM   GFR 54.66 (L) 03/15/2021 10:04 AM    Last diabetic Eye exam: No results found for: HMDIABEYEEXA  Last diabetic Foot exam: No results found for: HMDIABFOOTEX   Lab Results  Component Value Date   CHOL 147 08/22/2021   HDL 56.00 08/22/2021   LDLCALC 60 08/22/2021   LDLDIRECT 119.5 09/04/2012   TRIG 154.0 (H) 08/22/2021   CHOLHDL 3 08/22/2021    Hepatic Function Latest Ref Rng & Units 08/22/2021 03/15/2021 08/17/2020  Total Protein 6.0 - 8.3 g/dL 7.6 6.7 7.2  Albumin 3.5 - 5.2 g/dL 4.2 3.8 -  AST 0 - 37 U/L _0 ALT 0 - 35 U/L 74(H) 12 20  Alk Phosphatase 39 - 117 U/L 227(H) 88 -  Total Bilirubin 0.2 - 1.2 mg/dL 0.6 0.6 0.7  Bilirubin, Direct 0.0 - 0.3 mg/dL 0.1 - 0.2    Lab Results  Component Value Date/Time   TSH 1.80 08/22/2021 10:16 AM   TSH 1.79 08/17/2020 03:45 PM    CBC Latest Ref Rng & Units 08/22/2021 04/26/2021 03/19/2021  WBC 4.0 - 10.5 K/uL 8.8 10.3 5.7  Hemoglobin 12.0 - 15.0 g/dL 12.7 11.4(L) 11.2(L)  Hematocrit 36.0 - 46.0 % 38.0 34.3(L) 33.1(L)  Platelets 150.0 - 400.0 K/uL 203.0 174 179    No results found for: VD25OH  Clinical ASCVD: No  The 10-year ASCVD risk score (Arnett DK, et al., 2019) is: 30.8%   Values used to calculate the score:     Age: 80 years     Sex: Female     Is Non-Hispanic African American: No     Diabetic: No     Tobacco smoker: No     Systolic Blood Pressure: 916 mmHg     Is BP treated: Yes     HDL Cholesterol: 56 mg/dL     Total Cholesterol: 147 mg/dL    Depression screen [facility] 2/9 08/22/2021 08/17/2020 06/22/2019  Decreased Interest 0 0 0  Down,  Depressed, Hopeless 0 0 0  PHQ - 2 Score 0 0 0  Altered sleeping - - -  Tired, decreased energy - - -  Change in appetite - - -  Feeling bad or failure about yourself  - - -  Trouble concentrating - - -  Moving slowly or fidgety/restless - - -  Suicidal thoughts - - -  PHQ-9 Score - - -  Difficult doing work/chores - - -  Some recent data might be hidden    Social History   Tobacco Use  Smoking Status Never  Smokeless Tobacco Never   BP Readings from Last 3 Encounters:  09/12/21 134/76  08/22/21 (!) 146/92  05/03/21 (!) 174/72   Pulse Readings from Last 3 Encounters:  09/12/21 69  08/22/21 83  05/03/21 62   Wt Readings from Last 3 Encounters:  09/12/21 207 lb (93.9 kg)  08/22/21 209 lb (94.8 kg)  05/02/21 211 lb 3.2 oz (95.8 kg)   BMI Readings from Last 3 Encounters:  09/12/21 41.81 kg/m  08/22/21 42.21 kg/m  05/02/21 42.66 kg/m     Assessment/Interventions: Review of patient past medical history, allergies, medications, health status, including review of consultants reports, laboratory and other test data, was performed as part of comprehensive evaluation and provision of chronic care management services.   SDOH:  (Social Determinants of Health) assessments and interventions performed: Yes  SDOH Screenings   Alcohol Screen: Not on file  Depression (PHQ2-9): Low Risk    PHQ-2 Score: 0  Financial Resource Strain: Not on file  Food Insecurity: Not on file  Housing: Not on file  Physical Activity: Not on file  Social Connections: Not on file  Stress: Not on file  Tobacco Use: Low Risk    Smoking Tobacco Use: Never   Smokeless Tobacco Use: Never   Passive Exposure: Not on file  Transportation Needs: Not on file    CCM Care Plan  Allergies  Allergen Reactions   Tolterodine Other (See Comments)    dizziness   Iohexol Hives     Code: HIVES, Desc: ? Contrast reaction from CT prior to 2000.    Naproxen Itching and Rash   Neomycin-Bacitracin  Zn-Polymyx Itching and Rash    REACTION: redness   Sulfonamide Derivatives Itching and Rash    tingling    Medications Reviewed Today     Reviewed by Tomasa Blase, [facility] (Pharmacist) on 10/17/21 at 1149  Med List Status: <None>   Medication Order Taking? Sig Documenting Provider Last Dose Status Informant  acetaminophen (TYLENOL) 650 MG CR tablet [id number] Yes Take 650-1,300 mg by mouth every 8 (eight) hours as needed for pain. [provider] Taking Active Self  Calcium  Carb-Cholecalciferol (CALTRATE 600+D3 PO) [id number] Yes Take 1 tablet by mouth in the morning and at bedtime. [provider] Taking Active Self  hydrALAZINE (APRESOLINE) 50 MG tablet [id number] Yes Take 1 tablet (50 mg total) by mouth 3 (three) times daily. Janith Lima, MD Taking Active   irbesartan (AVAPRO) 300 MG tablet [id number] Yes Take 1 tablet (300 mg total) by mouth every evening. Janith Lima, MD Taking Active   Multiple Vitamins-Minerals (MULTIVITAMIN WITH MINERALS) tablet [id number] Yes Take 1 tablet by mouth in the morning. Centrum [provider] Taking Active Self  Multiple Vitamins-Minerals (PRESERVISION AREDS 2 PO) [id number] Yes Take 1 capsule by mouth 2 (two) times daily. [provider] Taking Active Self  omeprazole (PRILOSEC) 40 MG capsule [id number] Yes TAKE ONE CAPSULE BY MOUTH DAILY  Patient taking differently: Take 40 mg by mouth in the morning.   Mauri Pole, MD Taking Active   PARoxetine (PAXIL) 10 MG tablet [id number] Yes TAKE ONE TABLET BY MOUTH DAILY Janith Lima, MD Taking Active   polyethylene glycol powder (GAVILAX) 17 GM/SCOOP powder [id number] Yes Take 1 Container by mouth daily as needed. [provider] Taking Active   Propylene Glycol (SYSTANE BALANCE) 0.6 % SOLN [id number] Yes Place 1 drop into both eyes 3 (three) times daily as needed (dry eyes). [provider] Taking Active Self  rosuvastatin (CRESTOR) 20 MG tablet  [id number] Yes Take 1 tablet (20 mg total) by mouth in the morning. Janith Lima, MD Taking Active   solifenacin (VESICARE) 10 MG tablet [id number] Yes Take 1 tablet (10 mg total) by mouth daily. Janith Lima, MD Taking Active   TURMERIC PO [id number] Yes Take 1,000 mg by mouth in the morning. [provider] Taking Active Self            Patient Active Problem List   Diagnosis Date Noted   Chest tightness 09/12/2021   Asthmatic bronchitis 09/12/2021   D-dimer, elevated 09/12/2021   Flu vaccine need 09/12/2021   Hyperglycemia 08/22/2021   Deficiency anemia 08/22/2021   Chronic hyperglycemia 08/22/2021   Cough productive of yellow sputum 08/22/2021   LRTI (lower respiratory tract infection) 08/22/2021   Jet lag syndrome 05/16/2020   Status post total right knee replacement 08/17/2019   OAB (overactive bladder) 06/22/2019   ILD (interstitial lung disease) ([place]) 12/15/2018   Enlarged heart 06/16/2018   Morbid obesity ([place]) 10/17/2017   Functional belching disorder 12/31/2016   Hot flashes, menopausal 12/31/2016   DDD (degenerative disc disease), lumbar 11/06/2015   Right-sided low back pain without sciatica 15/52/0802   Diastolic dysfunction without heart failure 11/13/2014   Osteopenia of the elderly 05/27/2013   Hyperlipidemia LDL goal <130 09/04/2012   Esophageal stricture 09/12/2011   Visit for screening mammogram 08/08/2011   Routine general medical examination at a health care facility 08/08/2011   Essential hypertension, benign 01/14/2011   URINARY INCONTINENCE 12/14/2008   OA (osteoarthritis) 07/21/2008   Obstructive sleep apnea 10/14/2007   GERD 10/14/2007    Immunization History  Administered Date(s) Administered   Fluad Quad(high Dose 65+) 09/10/2019, 08/17/2020, 09/12/2021   Influenza Split 09/04/2012   Influenza Whole 08/30/2009, 09/18/2010, 08/08/2011   Influenza, High Dose Seasonal PF 08/30/2014, 09/02/2016, 09/11/2017, 09/22/2018    Influenza,inj,Quad PF,6+ Mos 09/02/2013, 09/01/2015   Moderna Sars-Covid-2 Vaccination 12/16/2019, 01/14/2020, 10/07/2020   Pneumococcal Conjugate-13 01/01/2018   Pneumococcal Polysaccharide-23 05/19/2019   Td 04/06/2010   Tdap 07/19/2019   Zoster Recombinat (Shingrix) 12/11/2019, 03/10/2020   Zoster,  Live 09/04/2012    Conditions to be addressed/monitored:  Hypertension, Hyperlipidemia, Overactive Bladder and Back pain  Care Plan : [place]  Updates made by Tomasa Blase, RPH since 10/17/2021 12:00 AM     Problem: Hypertension, Hyperlipidemia, Overactive Bladder and Back pain   Priority: High  Onset Date: 10/17/2021     Long-Range Goal: Disease management   Start Date: 01/25/2021  Expected End Date: 07/25/2021  Recent Progress: On track  Priority: High  Note:   Current Barriers:  Unable to independently monitor therapeutic efficacy Unable to maintain control of HTN  Pharmacist Clinical Goal(s):  Patient will achieve adherence to monitoring guidelines and medication adherence to achieve therapeutic efficacy maintain control of HTN as evidenced by patient report  through collaboration with PharmD and provider.   Interventions: 1:1 collaboration with Pricilla Holm MD regarding development and update of comprehensive plan of care as evidenced by provider attestation and co-signature Inter-disciplinary care team collaboration (see longitudinal plan of care) Comprehensive medication review performed; medication list updated in electronic medical record  Hypertension (BP goal < 140/90) Improved- BP now averaging 130-140/60-70's- since last visit  Home BP: 132/77, 142/70 Current regimen:  Irbesartan 300 mg daily HS Hydralazine 50 mg TID Previously tried: amlodipine - swelling, Edarbi, losartan, furosemide -excessive urination - pt declines as she has OAB Interventions: Counseled on proper BP monitoring technique Counseled on factors that can raise BP -  NSAIDs, salt, caffeine, pain, stress Advised patient to bring BP monitor to next PCP visit to ensure accuracy  Hyperlipidemia (LDL goal < 100) Controlled  - LDL is at goal; pt endorses compliance with statin Lab Results  Component Value Date   LDLCALC 60 08/22/2021  Current regimen:  Rosuvastatin 20 mg daily Interventions: Discussed cholesterol goals and benefits of medications for prevention of heart attack / stroke Recommend to continue current medication, recheck LFTs with next PCP visit, possible that patient would need to hold statin  Overactive bladder Controlled - per patient report medication has helped  Current regimen:  Solifenacin 10 mg daily  Previously tried/failed: tolterodine, Gemtesa (cost), Myrbetriq Interventions: Discussed benefits of medication Recommend to continue current medication  Back pain Controlled - patient reports that she has not been only having having to use APAP on occasion recently  -Back surgery 05/02/21 Current regimen:  Tylenol 650 mg PRN - takes 2 tablets at bedtime  Interventions: Recommended to limit APAP usage to 2058m daily (in setting of increased LFTs without cause at this time)  Recommended for patient to trial voltaren 1% gel - 2g QID prn to help keep pain under better control if additional medication is needed/ without the BP elevating effects of PO NSAIDS  Patient Goals/Self-Care Activities - take medications as prescribed -focus on medication adherence by pill box -Check BP daily -Complete CT scan          Medication Assistance: None required.  Patient affirms current coverage meets needs.  Compliance/Adherence/Medication fill history: Care Gaps: Vaccines - Covid, influenza Hep C Screening  [facility] [address]: [phone]: [address]Phone: [phone]: [phone] Uses pill box? Yes Pt endorses 100% compliance  We discussed: Current pharmacy is preferred with insurance plan and patient is satisfied with pharmacy services Patient decided  to: Continue current medication management strategy  Care Plan and Follow Up Patient Decision:  Patient agrees to Care Plan and Follow-up.  Plan: Telephone follow up appointment with care management team member scheduled for:  3 months  Tomasa Blase, PharmD Clinical Pharmacist, [place]

## 2021-10-17 NOTE — Patient Instructions (Signed)
Visit Information  The patient verbalized understanding of instructions, educational materials, and care plan provided today and declined offer to receive copy of patient instructions, educational materials, and care plan.   Telephone follow up appointment with care management team member scheduled for: 3 months The patient has been provided with contact information for the care management team and has been advised to call with any health related questions or concerns.   Cassandr Cederberg C Anzley Dibbern, PharmD Clinical Pharmacist, [place]      

## 2021-10-18 ENCOUNTER — Telehealth: Payer: Medicare Other

## 2021-10-18 DIAGNOSIS — Z23 Encounter for immunization: Secondary | ICD-10-CM | POA: Diagnosis not present

## 2021-10-18 DIAGNOSIS — U071 COVID-19: Secondary | ICD-10-CM | POA: Diagnosis not present

## 2021-10-31 DIAGNOSIS — E785 Hyperlipidemia, unspecified: Secondary | ICD-10-CM | POA: Diagnosis not present

## 2021-10-31 DIAGNOSIS — I10 Essential (primary) hypertension: Secondary | ICD-10-CM

## 2021-12-05 ENCOUNTER — Other Ambulatory Visit: Payer: Self-pay | Admitting: Internal Medicine

## 2021-12-05 DIAGNOSIS — I10 Essential (primary) hypertension: Secondary | ICD-10-CM

## 2021-12-06 ENCOUNTER — Emergency Department (HOSPITAL_BASED_OUTPATIENT_CLINIC_OR_DEPARTMENT_OTHER): Payer: Medicare Other

## 2021-12-06 ENCOUNTER — Encounter (HOSPITAL_BASED_OUTPATIENT_CLINIC_OR_DEPARTMENT_OTHER): Payer: Self-pay | Admitting: Emergency Medicine

## 2021-12-06 ENCOUNTER — Other Ambulatory Visit: Payer: Self-pay

## 2021-12-06 DIAGNOSIS — S0003XA Contusion of scalp, initial encounter: Secondary | ICD-10-CM | POA: Diagnosis not present

## 2021-12-06 DIAGNOSIS — M545 Low back pain, unspecified: Secondary | ICD-10-CM | POA: Insufficient documentation

## 2021-12-06 DIAGNOSIS — S0990XA Unspecified injury of head, initial encounter: Secondary | ICD-10-CM | POA: Diagnosis not present

## 2021-12-06 DIAGNOSIS — W01198A Fall on same level from slipping, tripping and stumbling with subsequent striking against other object, initial encounter: Secondary | ICD-10-CM | POA: Insufficient documentation

## 2021-12-06 DIAGNOSIS — Z79899 Other long term (current) drug therapy: Secondary | ICD-10-CM | POA: Insufficient documentation

## 2021-12-06 DIAGNOSIS — I10 Essential (primary) hypertension: Secondary | ICD-10-CM | POA: Insufficient documentation

## 2021-12-06 DIAGNOSIS — S199XXA Unspecified injury of neck, initial encounter: Secondary | ICD-10-CM | POA: Diagnosis not present

## 2021-12-06 NOTE — ED Triage Notes (Signed)
Pt arrives to ED with c/o fall. This occurred today at 330pm. Pt reports she tripped over a shopping cart and feel backwards. She hit the back of her head. She denies LOC but reports she was dizzy and disorientated for 15-30 minutes. Associated symptoms include headache, neck pain. No nausea, vomiting. No blood thinners.

## 2021-12-07 ENCOUNTER — Emergency Department (HOSPITAL_BASED_OUTPATIENT_CLINIC_OR_DEPARTMENT_OTHER)
Admission: EM | Admit: 2021-12-07 | Discharge: 2021-12-07 | Disposition: A | Discharge status: Home / Self Care | Payer: Medicare Other | Attending: Emergency Medicine | Admitting: Emergency Medicine

## 2021-12-07 DIAGNOSIS — W19XXXA Unspecified fall, initial encounter: Secondary | ICD-10-CM

## 2021-12-07 DIAGNOSIS — S0003XA Contusion of scalp, initial encounter: Secondary | ICD-10-CM | POA: Diagnosis not present

## 2021-12-07 MED ORDER — ACETAMINOPHEN 325 MG PO TABS
650.0000 mg | ORAL_TABLET | Freq: Once | ORAL | Status: AC
  Administered 2021-12-07: 650 mg via ORAL
  Filled 2021-12-07: qty 2

## 2021-12-07 MED ORDER — HYDRALAZINE HCL 25 MG PO TABS
50.0000 mg | ORAL_TABLET | Freq: Once | ORAL | Status: AC
  Administered 2021-12-07: 50 mg via ORAL
  Filled 2021-12-07: qty 2

## 2021-12-07 NOTE — ED Provider Notes (Signed)
[facility] EMERGENC[facility] DEPT Provider Note   CSN: [id number] Arrival date & time: 12/06/21  1736     History  Chief Complaint  Patient presents with   Veronica Garza is a 81 y.o. female.  The history is provided by the patient and a relative.  Fall Veronica Garza is a 81 y.o. female who presents to the Emergency Department complaining of fall.  She presents to the ED accompanied by her son for evaluation of injuries following a fall that occurred at 330pm.  She was trying to get groceries out of the cart and got tangled and fell backwards, striking her head.  She complains of HA.  She developed low back pain several hours later, has a hx/o spine surgery.  Has a hx/o HTN.  Does not take blood thinner.    No CP, SOB, AP, numbness, weakness, nausea.   No recent illnesses.      Home Medications Prior to Admission medications   Medication Sig Start Date End Date Taking? Authorizing Provider  acetaminophen (TYLENOL) 650 MG CR tablet Take 650-1,300 mg by mouth every 8 (eight) hours as needed for pain.    [provider]  Calcium Carb-Cholecalciferol (CALTRATE 600+D3 PO) Take 1 tablet by mouth in the morning and at bedtime.    [provider]  hydrALAZINE (APRESOLINE) 50 MG tablet Take 1 tablet (50 mg total) by mouth 3 (three) times daily. 08/14/21   Janith Lima, MD  irbesartan (AVAPRO) 300 MG tablet Take 1 tablet (300 mg total) by mouth every evening. 12/05/21   Janith Lima, MD  Multiple Vitamins-Minerals (MULTIVITAMIN WITH MINERALS) tablet Take 1 tablet by mouth in the morning. Centrum    [provider]  Multiple Vitamins-Minerals (PRESERVISION AREDS 2 PO) Take 1 capsule by mouth 2 (two) times daily.    [provider]  omeprazole (PRILOSEC) 40 MG capsule TAKE ONE CAPSULE BY MOUTH DAILY Patient taking differently: Take 40 mg by mouth in the morning. 06/28/20   Mauri Pole, MD  PARoxetine (PAXIL) 10 MG tablet TAKE ONE  TABLET BY MOUTH DAILY 06/19/21   Janith Lima, MD  polyethylene glycol powder (GAVILAX) 17 GM/SCOOP powder Take 1 Container by mouth daily as needed.    [provider]  Propylene Glycol (SYSTANE BALANCE) 0.6 % SOLN Place 1 drop into both eyes 3 (three) times daily as needed (dry eyes).    [provider]  rosuvastatin (CRESTOR) 20 MG tablet Take 1 tablet (20 mg total) by mouth in the morning. 05/05/21   Janith Lima, MD  solifenacin (VESICARE) 10 MG tablet Take 1 tablet (10 mg total) by mouth daily. 08/23/21   Janith Lima, MD  TURMERIC PO Take 1,000 mg by mouth in the morning.    [provider]      Allergies    Tolterodine, Iohexol, Naproxen, Neomycin-bacitracin zn-polymyx, and Sulfonamide derivatives    Review of Systems   Review of Systems  All other systems reviewed and are negative.  Physical Exam Updated Vital Signs BP (!) 206/85 (BP Location: Right Arm)    Pulse 72    Temp 97.7 F (36.5 C)    Resp 18    Ht 4\' 11"  (1.499 m)    Wt 93 kg    SpO2 97%    BMI 41.40 kg/m  Physical Exam Vitals and nursing note reviewed.  Constitutional:      Appearance: She is well-developed.  HENT:  Head: Normocephalic.     Comments: Ecchymosis and tenderness to occipital scalp Neck:     Comments: Mid cervical spine tenderness.  Cardiovascular:     Rate and Rhythm: Normal rate and regular rhythm.     Heart sounds: No murmur heard. Pulmonary:     Effort: Pulmonary effort is normal. No respiratory distress.     Breath sounds: Normal breath sounds.  Abdominal:     Palpations: Abdomen is soft.     Tenderness: There is no abdominal tenderness. There is no guarding or rebound.  Musculoskeletal:        General: No tenderness.     Comments: No discrete thoracic or lumbar tenderness  Skin:    General: Skin is warm and dry.  Neurological:     Mental Status: She is alert and oriented to person, place, and time.     Comments: 5/5 strength in all four extremities.     Psychiatric:        Behavior: Behavior normal.    ED Results / Procedures / Treatments   Labs (all labs ordered are listed, but only abnormal results are displayed) Labs Reviewed - No data to display  EKG None  Radiology CT Head Wo Contrast  Result Date: 12/06/2021 CLINICAL DATA:  Head trauma, minor (Age >= 65y); Neck trauma (Age >= 65y) EXAM: CT HEAD WITHOUT CONTRAST CT CERVICAL SPINE WITHOUT CONTRAST TECHNIQUE: Multidetector CT imaging of the head and cervical spine was performed following the standard protocol without intravenous contrast. Multiplanar CT image reconstructions of the cervical spine were also generated. COMPARISON:  None. FINDINGS: CT HEAD FINDINGS BRAIN: BRAIN Cerebral ventricle sizes are concordant with the degree of cerebral volume loss. Patchy and confluent areas of decreased attenuation are noted throughout the deep and periventricular white matter of the cerebral hemispheres bilaterally, compatible with chronic microvascular ischemic disease. No evidence of large-territorial acute infarction. No parenchymal hemorrhage. No mass lesion. No extra-axial collection. No mass effect or midline shift. No hydrocephalus. Basilar cisterns are patent. Vascular: No hyperdense vessel. Skull: No acute fracture or focal lesion. Sinuses/Orbits: Paranasal sinuses and mastoid air cells are clear. Bilateral lens replacement. Otherwise the orbits are unremarkable. Other: None. CT CERVICAL SPINE FINDINGS Alignment: Grade 1 anterolisthesis of C4 on C5. Skull base and vertebrae: Multilevel moderate severe degenerative changes of the spine. No associated severe osseous central canal or neural foraminal stenosis. No acute fracture. No aggressive appearing focal osseous lesion or focal pathologic process. Soft tissues and spinal canal: No prevertebral fluid or swelling. No visible canal hematoma. Upper chest: Unremarkable. Other: None. IMPRESSION: 1. No acute intracranial abnormality. 2. No acute  displaced fracture or traumatic listhesis of the cervical spine. Electronically Signed   By: Iven Finn M.D.   On: 12/06/2021 19:13   CT Cervical Spine Wo Contrast  Result Date: 12/06/2021 CLINICAL DATA:  Head trauma, minor (Age >= 65y); Neck trauma (Age >= 65y) EXAM: CT HEAD WITHOUT CONTRAST CT CERVICAL SPINE WITHOUT CONTRAST TECHNIQUE: Multidetector CT imaging of the head and cervical spine was performed following the standard protocol without intravenous contrast. Multiplanar CT image reconstructions of the cervical spine were also generated. COMPARISON:  None. FINDINGS: CT HEAD FINDINGS BRAIN: BRAIN Cerebral ventricle sizes are concordant with the degree of cerebral volume loss. Patchy and confluent areas of decreased attenuation are noted throughout the deep and periventricular white matter of the cerebral hemispheres bilaterally, compatible with chronic microvascular ischemic disease. No evidence of large-territorial acute infarction. No parenchymal hemorrhage. No mass lesion. No extra-axial  collection. No mass effect or midline shift. No hydrocephalus. Basilar cisterns are patent. Vascular: No hyperdense vessel. Skull: No acute fracture or focal lesion. Sinuses/Orbits: Paranasal sinuses and mastoid air cells are clear. Bilateral lens replacement. Otherwise the orbits are unremarkable. Other: None. CT CERVICAL SPINE FINDINGS Alignment: Grade 1 anterolisthesis of C4 on C5. Skull base and vertebrae: Multilevel moderate severe degenerative changes of the spine. No associated severe osseous central canal or neural foraminal stenosis. No acute fracture. No aggressive appearing focal osseous lesion or focal pathologic process. Soft tissues and spinal canal: No prevertebral fluid or swelling. No visible canal hematoma. Upper chest: Unremarkable. Other: None. IMPRESSION: 1. No acute intracranial abnormality. 2. No acute displaced fracture or traumatic listhesis of the cervical spine. Electronically Signed    By: Iven Finn M.D.   On: 12/06/2021 19:13    Procedures Procedures    Medications Ordered in ED Medications - No data to display  ED Course/ Medical Decision Making/ A&P                           Medical Decision Making  Patient here for evaluation of injuries following a mechanical fall that occurred at 3:30 in the afternoon.  She is neurologically intact on evaluation.  She does have ecchymosis and tenderness over the posterior scalp, tenderness over posterior neck.  Imaging is negative for acute fracture, intracranial abnormality.  She does have some mild low back stiffness without bony tenderness.  Her pain occurred several hours after the injury.  Discussed option of imaging and patient declines at this time.  Discussed with patient and son home care for contusion, muscle soreness following a fall with Tylenol and rest.  She is hypertensive in the emergency department but missed her home medications.  Discussed taking the remaining home medications when she returns, will provide available medication in the emergency department for her hypertension.  Discussed outpatient follow-up and return precautions.        Final Clinical Impression(s) / ED Diagnoses Final diagnoses:  None    Rx / DC Orders ED Discharge Orders     None         Quintella Reichert, MD 12/07/21 0116

## 2021-12-21 ENCOUNTER — Other Ambulatory Visit: Payer: Self-pay | Admitting: Internal Medicine

## 2021-12-21 DIAGNOSIS — I10 Essential (primary) hypertension: Secondary | ICD-10-CM

## 2021-12-21 DIAGNOSIS — N951 Menopausal and female climacteric states: Secondary | ICD-10-CM

## 2021-12-28 ENCOUNTER — Other Ambulatory Visit: Payer: Self-pay | Admitting: Internal Medicine

## 2021-12-28 DIAGNOSIS — E785 Hyperlipidemia, unspecified: Secondary | ICD-10-CM

## 2022-01-17 ENCOUNTER — Ambulatory Visit (INDEPENDENT_AMBULATORY_CARE_PROVIDER_SITE_OTHER): Payer: Medicare Other

## 2022-01-17 DIAGNOSIS — I10 Essential (primary) hypertension: Secondary | ICD-10-CM

## 2022-01-17 DIAGNOSIS — E785 Hyperlipidemia, unspecified: Secondary | ICD-10-CM

## 2022-01-17 NOTE — Patient Instructions (Addendum)
Visit Information  Following are the goals we discussed today:   Track and Manage My Blood Pressure   Timeframe:  Long-Range Goal Priority:  High Start Date:     04/10/21                        Expected End Date:  01/17/2023                    Follow Up Date 04/16/2022   - check blood pressure daily - choose a place to take my blood pressure (home, clinic or office, retail store) - write blood pressure results in a log or diary  -split Irbesartan dosing to 150mg  BID    Why is this important?   You won't feel high blood pressure, but it can still hurt your blood vessels.  High blood pressure can cause heart or kidney problems. It can also cause a stroke.  Making lifestyle changes like losing a little weight or eating less salt will help.  Checking your blood pressure at home and at different times of the day can help to control blood pressure.  If the doctor prescribes medicine remember to take it the way the doctor ordered.  Call the office if you cannot afford the medicine or if there are questions about it.  Manage My Medicine   Timeframe:  Long-Range Goal Priority:  Medium Start Date:     04/10/21                        Expected End Date:  01/17/2023                    Follow Up Date 04/16/2022   - call for medicine refill 2 or 3 days before it runs out - call if I am sick and can't take my medicine - keep a list of all the medicines I take; vitamins and herbals too - use a pillbox to sort medicine    Why is this important?   These steps will help you keep on track with your medicines.  Plan: Telephone follow up appointment with care management team member scheduled for:  3 months  The patient has been provided with contact information for the care management team and has been advised to call with any health related questions or concerns.   Tomasa Blase, PharmD Clinical Pharmacist, [facility]   Please call the care guide team at [phone] if you need to  cancel or reschedule your appointment.   The patient verbalized understanding of instructions, educational materials, and care plan provided today and declined offer to receive copy of patient instructions, educational materials, and care plan.

## 2022-01-17 NOTE — Progress Notes (Signed)
Chronic Care Management Pharmacy Note  01/17/2022 Name:  Veronica Garza MRN:  [id number] DOB:  1941-07-17  Summary: -Patient reports two falls since last visit - 12/07/2021 while trying to catch up with a shopping cart at the grocery store (went to ED for evaluation), and 01/05/2022 while getting out of a car it pulled away while she was still holding on - reports that she is doing well, recovering and feeling better since last fall - denies hitting head / LOC with latest fall -Confirms adherence to current medications  -Checking BP at home, has been averaging 150-160/60-70's   Recommendations/Changes made from today's visit: - Recommending for patient to continue monitoring BP - patient to split irbesartan doing to 1/2 tablet BID - write BP's in log and take to visit with Dr. Sharlet Salina in 2 weeks to discuss    Subjective: Veronica Garza is an 81 y.o. year old female who is a primary patient of Dr Scarlette Calico. The CCM team was consulted for assistance with disease management and care coordination needs.    Engaged with patient by telephone for follow up visit in response to provider referral for pharmacy case management and/or care coordination services.   Consent to Services:  The patient was given information about Chronic Care Management services, agreed to services, and gave verbal consent prior to initiation of services.  Please see initial visit note for detailed documentation.   Patient Care Team: Janith Lima, MD as PCP - General Rigoberto Noel, MD as Consulting Physician (Pulmonary Disease) Mauri Pole, MD as Consulting Physician (Gastroenterology) Charlton Haws, [facility] as Pharmacist (Pharmacist)  Recent office visits: None since last visit   Recent consult visits: None since last visit   Hospital visits: 12/07/2021 - ED visit - fall - declined imaging - no changes to medications   Objective:  Lab Results  Component Value Date   CREATININE 0.87 08/22/2021    BUN 20 08/22/2021   GFR 63.64 08/22/2021   GFRNONAA >60 04/26/2021   GFRAA 71 08/17/2020   NA 137 08/22/2021   K 3.8 08/22/2021   CALCIUM 9.6 08/22/2021   CO2 28 08/22/2021    Lab Results  Component Value Date/Time   HGBA1C 6.0 08/22/2021 10:16 AM   HGBA1C 5.6 08/17/2020 03:45 PM   GFR 63.64 08/22/2021 10:16 AM   GFR 54.66 (L) 03/15/2021 10:04 AM    Last diabetic Eye exam: No results found for: HMDIABEYEEXA  Last diabetic Foot exam: No results found for: HMDIABFOOTEX   Lab Results  Component Value Date   CHOL 147 08/22/2021   HDL 56.00 08/22/2021   LDLCALC 60 08/22/2021   LDLDIRECT 119.5 09/04/2012   TRIG 154.0 (H) 08/22/2021   CHOLHDL 3 08/22/2021    Hepatic Function Latest Ref Rng & Units 08/22/2021 03/15/2021 08/17/2020  Total Protein 6.0 - 8.3 g/dL 7.6 6.7 7.2  Albumin 3.5 - 5.2 g/dL 4.2 3.8 -  AST 0 - 37 U/L 30 16 24   ALT 0 - 35 U/L 74(H) 12 20  Alk Phosphatase 39 - 117 U/L 227(H) 88 -  Total Bilirubin 0.2 - 1.2 mg/dL 0.6 0.6 0.7  Bilirubin, Direct 0.0 - 0.3 mg/dL 0.1 - 0.2    Lab Results  Component Value Date/Time   TSH 1.80 08/22/2021 10:16 AM   TSH 1.79 08/17/2020 03:45 PM    CBC Latest Ref Rng & Units 08/22/2021 04/26/2021 03/19/2021  WBC 4.0 - 10.5 K/uL 8.8 10.3 5.7  Hemoglobin 12.0 - 15.0 g/dL 12.7  11.4(L) 11.2(L)  Hematocrit 36.0 - 46.0 % 38.0 34.3(L) 33.1(L)  Platelets 150.0 - 400.0 K/uL 203.0 174 179    No results found for: VD25OH  Clinical ASCVD: No  The ASCVD Risk score (Arnett DK, et al., 2019) failed to calculate for the following reasons:   The valid systolic blood pressure range is 90 to 200 mmHg    Depression screen [facility] 2/9 08/22/2021 08/17/2020 06/22/2019  Decreased Interest 0 0 0  Down, Depressed, Hopeless 0 0 0  PHQ - 2 Score 0 0 0  Altered sleeping - - -  Tired, decreased energy - - -  Change in appetite - - -  Feeling bad or failure about yourself  - - -  Trouble concentrating - - -  Moving slowly or fidgety/restless - - -   Suicidal thoughts - - -  PHQ-9 Score - - -  Difficult doing work/chores - - -  Some recent data might be hidden    Social History   Tobacco Use  Smoking Status Never  Smokeless Tobacco Never   BP Readings from Last 3 Encounters:  12/07/21 (!) 206/85  09/12/21 134/76  08/22/21 (!) 146/92   Pulse Readings from Last 3 Encounters:  12/07/21 72  09/12/21 69  08/22/21 83   Wt Readings from Last 3 Encounters:  12/06/21 205 lb (93 kg)  09/12/21 207 lb (93.9 kg)  08/22/21 209 lb (94.8 kg)   BMI Readings from Last 3 Encounters:  12/06/21 41.40 kg/m  09/12/21 41.81 kg/m  08/22/21 42.21 kg/m     Assessment/Interventions: Review of patient past medical history, allergies, medications, health status, including review of consultants reports, laboratory and other test data, was performed as part of comprehensive evaluation and provision of chronic care management services.   SDOH:  (Social Determinants of Health) assessments and interventions performed: Yes  SDOH Screenings   Alcohol Screen: Not on file  Depression (PHQ2-9): Low Risk    PHQ-2 Score: 0  Financial Resource Strain: Not on file  Food Insecurity: Not on file  Housing: Not on file  Physical Activity: Not on file  Social Connections: Not on file  Stress: Not on file  Tobacco Use: Low Risk    Smoking Tobacco Use: Never   Smokeless Tobacco Use: Never   Passive Exposure: Not on file  Transportation Needs: Not on file    CCM Care Plan  Allergies  Allergen Reactions   Tolterodine Other (See Comments)    dizziness   Iohexol Hives     Code: HIVES, Desc: ? Contrast reaction from CT prior to 2000.    Naproxen Itching and Rash   Neomycin-Bacitracin Zn-Polymyx Itching and Rash    REACTION: redness   Sulfonamide Derivatives Itching and Rash    tingling    Medications Reviewed Today     Reviewed by Tomasa Blase, [facility] (Pharmacist) on 01/17/22 at 1044  Med List Status: <None>   Medication Order Taking?  Sig Documenting Provider Last Dose Status Informant  acetaminophen (TYLENOL) 650 MG CR tablet [id number] Yes Take 650-1,300 mg by mouth every 8 (eight) hours as needed for pain. [provider] Taking Active Self  Calcium Carb-Cholecalciferol (CALTRATE 600+D3 PO) [id number] Yes Take 1 tablet by mouth in the morning and at bedtime. [provider] Taking Active Self  hydrALAZINE (APRESOLINE) 50 MG tablet [id number] Yes Take 1 tablet (50 mg total) by mouth 3 (three) times daily. Janith Lima, MD Taking Active   irbesartan (AVAPRO) 300 MG tablet [id number] Yes  Take 1 tablet (300 mg total) by mouth every evening.  Patient taking differently: Take 150 mg by mouth in the morning and at bedtime.   Janith Lima, MD Taking Active   Multiple Vitamins-Minerals (MULTIVITAMIN WITH MINERALS) tablet [id number] Yes Take 1 tablet by mouth in the morning. Centrum [provider] Taking Active Self  Multiple Vitamins-Minerals (PRESERVISION AREDS 2 PO) [id number] Yes Take 1 capsule by mouth 2 (two) times daily. [provider] Taking Active Self  omeprazole (PRILOSEC) 40 MG capsule [id number] Yes TAKE ONE CAPSULE BY MOUTH DAILY  Patient taking differently: Take 40 mg by mouth in the morning.   Mauri Pole, MD Taking Active   PARoxetine (PAXIL) 10 MG tablet [id number] Yes TAKE ONE TABLET BY MOUTH DAILY Janith Lima, MD Taking Active   polyethylene glycol powder (GLYCOLAX/MIRALAX) 17 GM/SCOOP powder [id number] Yes Take 1 Container by mouth daily as needed. [provider] Taking Active   Propylene Glycol (SYSTANE BALANCE) 0.6 % SOLN [id number] Yes Place 1 drop into both eyes 3 (three) times daily as needed (dry eyes). [provider] Taking Active Self  rosuvastatin (CRESTOR) 20 MG tablet [id number] Yes TAKE ONE TABLET BY MOUTH ONCE DAILY IN THE MORNING Janith Lima, MD Taking Active   solifenacin (VESICARE) 10 MG tablet [id number] Yes Take 1 tablet (10 mg  total) by mouth daily. Janith Lima, MD Taking Active   TURMERIC PO [id number] Yes Take 1,000 mg by mouth in the morning. [provider] Taking Active Self            Patient Active Problem List   Diagnosis Date Noted   Chest tightness 09/12/2021   Asthmatic bronchitis 09/12/2021   D-dimer, elevated 09/12/2021   Flu vaccine need 09/12/2021   Hyperglycemia 08/22/2021   Deficiency anemia 08/22/2021   Chronic hyperglycemia 08/22/2021   Cough productive of yellow sputum 08/22/2021   LRTI (lower respiratory tract infection) 08/22/2021   Jet lag syndrome 05/16/2020   Status post total right knee replacement 08/17/2019   OAB (overactive bladder) 06/22/2019   ILD (interstitial lung disease) ([place]) 12/15/2018   Enlarged heart 06/16/2018   Morbid obesity ([place]) 10/17/2017   Functional belching disorder 12/31/2016   Hot flashes, menopausal 12/31/2016   DDD (degenerative disc disease), lumbar 11/06/2015   Right-sided low back pain without sciatica 17/40/8144   Diastolic dysfunction without heart failure 11/13/2014   Osteopenia of the elderly 05/27/2013   Hyperlipidemia LDL goal <130 09/04/2012   Esophageal stricture 09/12/2011   Visit for screening mammogram 08/08/2011   Routine general medical examination at a health care facility 08/08/2011   Essential hypertension, benign 01/14/2011   URINARY INCONTINENCE 12/14/2008   OA (osteoarthritis) 07/21/2008   Obstructive sleep apnea 10/14/2007   GERD 10/14/2007    Immunization History  Administered Date(s) Administered   Fluad Quad(high Dose 65+) 09/10/2019, 08/17/2020, 09/12/2021   Influenza Split 09/04/2012   Influenza Whole 08/30/2009, 09/18/2010, 08/08/2011   Influenza, High Dose Seasonal PF 08/30/2014, 09/02/2016, 09/11/2017, 09/22/2018   Influenza,inj,Quad PF,6+ Mos 09/02/2013, 09/01/2015   Moderna Sars-Covid-2 Vaccination 12/16/2019, 01/14/2020, 10/07/2020   Pneumococcal Conjugate-13 01/01/2018   Pneumococcal  Polysaccharide-23 05/19/2019   Td 04/06/2010   Tdap 07/19/2019   Zoster Recombinat (Shingrix) 12/11/2019, 03/10/2020   Zoster, Live 09/04/2012    Conditions to be addressed/monitored:  Hypertension, Hyperlipidemia, Overactive Bladder and Back pain  Care Plan : [place]  Updates made by Tomasa Blase, RPH since 01/17/2022 12:00 AM     Problem:  Hypertension, Hyperlipidemia, Overactive Bladder and Back pain   Priority: High  Onset Date: 10/17/2021     Long-Range Goal: Disease management   Start Date: 01/25/2021  Expected End Date: 01/17/2023  This Visit's Progress: Not on track  Recent Progress: On track  Priority: High  Note:   Current Barriers:  Unable to independently monitor therapeutic efficacy Unable to maintain control of HTN  Pharmacist Clinical Goal(s):  Patient will achieve adherence to monitoring guidelines and medication adherence to achieve therapeutic efficacy maintain control of HTN as evidenced by patient report  through collaboration with PharmD and provider.   Interventions: 1:1 collaboration with Pricilla Holm MD regarding development and update of comprehensive plan of care as evidenced by provider attestation and co-signature Inter-disciplinary care team collaboration (see longitudinal plan of care) Comprehensive medication review performed; medication list updated in electronic medical record  Hypertension (BP goal < 140/90) Not ideally controlled -  BP now averaging 150-160/60-70's- since last visit - likely increased due to pain from recent falls  Current regimen:  Irbesartan 300 mg daily HS Hydralazine 50 mg TID Previously tried: amlodipine - swelling, Edarbi, losartan, furosemide -excessive urination - pt declines as she has OAB Interventions: Counseled on proper BP monitoring technique Counseled on factors that can raise BP - NSAIDs, salt, caffeine, pain, stress Advised patient to bring BP monitor to next PCP visit to ensure  accuracy Recommended for patient to split irbesartan to 158m BID   Hyperlipidemia (LDL goal < 100) Controlled  - LDL is at goal; pt endorses compliance with statin Lab Results  Component Value Date   LDLCALC 60 08/22/2021  Current regimen:  Rosuvastatin 20 mg daily Interventions: Discussed cholesterol goals and benefits of medications for prevention of heart attack / stroke Recommend to continue current medication, recheck LFTs with next PCP visit, possible that patient would need to hold statin  Overactive bladder Controlled - per patient report medication has helped  Current regimen:  Solifenacin 10 mg daily  Previously tried/failed: tolterodine, Gemtesa (cost), Myrbetriq Interventions: Discussed benefits of medication Recommend to continue current medication  Back pain Controlled  -Back surgery 05/02/21 Current regimen:  Tylenol 650 mg PRN - takes 2 tablets at bedtime  Interventions: Recommended to limit APAP usage to 20058mdaily (in setting of increased LFTs without cause at this time)  Recommended for patient to trial voltaren 1% gel - 2g QID prn to help keep pain under better control if additional medication is needed/ without the BP elevating effects of PO NSAIDS  Patient Goals/Self-Care Activities - take medications as prescribed -focus on medication adherence by pill box -Check BP daily -Complete CT scan      Medication Assistance: None required.  Patient affirms current coverage meets needs.  Compliance/Adherence/Medication fill history: Care Gaps: Vaccines - Covid Hep C Screening  [facility] [address]: [phone]: [address]one: [phone]: [phone] Uses pill box? Yes Pt endorses 100% compliance  We discussed: Current  pharmacy is preferred with insurance plan and patient is satisfied with pharmacy services Patient decided to: Continue current medication management strategy  Care Plan and Follow Up Patient Decision:  Patient agrees to Care Plan and Follow-up.  Plan: Telephone follow up appointment with care management team member scheduled for:  3  months  Tomasa Blase, PharmD Clinical Pharmacist, [place]

## 2022-01-29 ENCOUNTER — Ambulatory Visit (INDEPENDENT_AMBULATORY_CARE_PROVIDER_SITE_OTHER): Payer: Medicare Other | Admitting: Internal Medicine

## 2022-01-29 ENCOUNTER — Other Ambulatory Visit: Payer: Self-pay

## 2022-01-29 ENCOUNTER — Encounter: Payer: Self-pay | Admitting: Internal Medicine

## 2022-01-29 VITALS — BP 130/66 | HR 80 | Resp 18 | Ht 59.0 in | Wt 208.6 lb

## 2022-01-29 DIAGNOSIS — J849 Interstitial pulmonary disease, unspecified: Secondary | ICD-10-CM

## 2022-01-29 DIAGNOSIS — E785 Hyperlipidemia, unspecified: Secondary | ICD-10-CM

## 2022-01-29 DIAGNOSIS — R7989 Other specified abnormal findings of blood chemistry: Secondary | ICD-10-CM

## 2022-01-29 DIAGNOSIS — I10 Essential (primary) hypertension: Secondary | ICD-10-CM

## 2022-01-29 DIAGNOSIS — N3281 Overactive bladder: Secondary | ICD-10-CM

## 2022-01-29 DIAGNOSIS — R739 Hyperglycemia, unspecified: Secondary | ICD-10-CM | POA: Diagnosis not present

## 2022-01-29 LAB — COMPREHENSIVE METABOLIC PANEL
ALT: 16 U/L (ref 0–35)
AST: 21 U/L (ref 0–37)
Albumin: 4.4 g/dL (ref 3.5–5.2)
Alkaline Phosphatase: 91 U/L (ref 39–117)
BUN: 17 mg/dL (ref 6–23)
CO2: 29 mEq/L (ref 19–32)
Calcium: 9.5 mg/dL (ref 8.4–10.5)
Chloride: 101 mEq/L (ref 96–112)
Creatinine, Ser: 1.25 mg/dL — ABNORMAL HIGH (ref 0.40–1.20)
GFR: 41.07 mL/min — ABNORMAL LOW (ref >60.00)
Glucose, Bld: 107 mg/dL — ABNORMAL HIGH (ref 70–99)
Potassium: 4 mEq/L (ref 3.5–5.1)
Sodium: 138 mEq/L (ref 135–145)
Total Bilirubin: 0.7 mg/dL (ref 0.2–1.2)
Total Protein: 7.2 g/dL (ref 6.0–8.3)

## 2022-01-29 LAB — HEMOGLOBIN A1C: Hgb A1c MFr Bld: 5.8 % (ref 4.6–6.5)

## 2022-01-29 NOTE — Patient Instructions (Addendum)
Make sure to take the cane anytime you leave the house.  We are checking the labs today.

## 2022-01-29 NOTE — Progress Notes (Addendum)
° °  Subjective:   Patient ID: Veronica Garza, female    DOB: [birth year], 81 y.o.   MRN: [id number]  HPI The patient is a transfer of care with discussion of ongoing care.   Review of Systems  Constitutional: Negative.   HENT: Negative.    Eyes: Negative.   Respiratory:  Negative for cough, chest tightness and shortness of breath.   Cardiovascular:  Negative for chest pain, palpitations and leg swelling.  Gastrointestinal:  Negative for abdominal distention, abdominal pain, constipation, diarrhea, nausea and vomiting.  Musculoskeletal:  Positive for arthralgias, gait problem and myalgias.  Skin: Negative.   Psychiatric/Behavioral: Negative.     Objective:  Physical Exam Constitutional:      Appearance: She is well-developed. She is obese.  HENT:     Head: Normocephalic and atraumatic.  Cardiovascular:     Rate and Rhythm: Normal rate and regular rhythm.  Pulmonary:     Effort: Pulmonary effort is normal. No respiratory distress.     Breath sounds: Normal breath sounds. No wheezing or rales.  Abdominal:     General: Bowel sounds are normal. There is no distension.     Palpations: Abdomen is soft.     Tenderness: There is no abdominal tenderness. There is no rebound.  Musculoskeletal:        General: Tenderness present.     Cervical back: Normal range of motion.  Skin:    General: Skin is warm and dry.  Neurological:     Mental Status: She is alert and oriented to person, place, and time.     Coordination: Coordination abnormal.     Comments: Cane with walking    Vitals:   01/29/22 1325  BP: 130/66  Pulse: 80  Resp: 18  SpO2: 96%  Weight: 208 lb 9.6 oz (94.6 kg)  Height: 4\' 11"  (1.499 m)    This visit occurred during the SARS-CoV-2 public health emergency.  Safety protocols were in place, including screening questions prior to the visit, additional usage of staff PPE, and extensive cleaning of exam room while observing appropriate contact time as indicated for  disinfecting solutions.   Assessment & Plan:  Visit time 25 minutes in face to face communication with patient and coordination of care, additional 15 minutes spent in record review, coordination or care, ordering tests, communicating/referring to other healthcare professionals, documenting in medical records all on the same day of the visit for total time 40 minutes spent on the visit.

## 2022-02-01 NOTE — Assessment & Plan Note (Deleted)
Checking HgA1c as screening for pre-diabetes and diabetes. Adjust as needed.  ?

## 2022-02-01 NOTE — Assessment & Plan Note (Signed)
Checking HgA1c as screening for pre-diabetes and diabetes. Adjust as needed.  ?

## 2022-02-01 NOTE — Assessment & Plan Note (Signed)
Taking vesicare 10 mg daily. Overall decent control with this will maintain.  ?

## 2022-02-01 NOTE — Assessment & Plan Note (Signed)
Counseled on need to lose weight. Counseled on diet/exercise.  ?

## 2022-02-01 NOTE — Assessment & Plan Note (Signed)
She does not have SOB on exertion and does not need further work up of this at this time. No signs of autoimmune disease at this time.  ?

## 2022-02-01 NOTE — Assessment & Plan Note (Signed)
Checking CMP and adjust irbesartan 300 mg daily and hydralazine 50 mg TID and BP at goal today. Well controlled.  ?

## 2022-02-06 DIAGNOSIS — H938X1 Other specified disorders of right ear: Secondary | ICD-10-CM | POA: Diagnosis not present

## 2022-02-06 DIAGNOSIS — H6122 Impacted cerumen, left ear: Secondary | ICD-10-CM | POA: Diagnosis not present

## 2022-02-06 DIAGNOSIS — Z974 Presence of external hearing-aid: Secondary | ICD-10-CM | POA: Diagnosis not present

## 2022-02-06 DIAGNOSIS — R42 Dizziness and giddiness: Secondary | ICD-10-CM | POA: Diagnosis not present

## 2022-03-19 ENCOUNTER — Other Ambulatory Visit: Payer: Self-pay | Admitting: Internal Medicine

## 2022-03-19 DIAGNOSIS — N3281 Overactive bladder: Secondary | ICD-10-CM

## 2022-04-16 ENCOUNTER — Ambulatory Visit (INDEPENDENT_AMBULATORY_CARE_PROVIDER_SITE_OTHER): Payer: Medicare Other

## 2022-04-16 DIAGNOSIS — N3281 Overactive bladder: Secondary | ICD-10-CM

## 2022-04-16 DIAGNOSIS — K219 Gastro-esophageal reflux disease without esophagitis: Secondary | ICD-10-CM

## 2022-04-16 DIAGNOSIS — M159 Polyosteoarthritis, unspecified: Secondary | ICD-10-CM

## 2022-04-16 DIAGNOSIS — I10 Essential (primary) hypertension: Secondary | ICD-10-CM

## 2022-04-16 DIAGNOSIS — E785 Hyperlipidemia, unspecified: Secondary | ICD-10-CM

## 2022-04-16 NOTE — Patient Instructions (Signed)
Visit Information ? ?Following are the goals we discussed today:  ? ?Manage My Medicine  ? ?Timeframe:  Long-Range Goal ?Priority:  Medium ?Start Date:     04/10/21                        ?Expected End Date:  01/17/2023                   ? ?Follow Up Date 07/2022 ?  ?- call for medicine refill 2 or 3 days before it runs out ?- call if I am sick and can't take my medicine ?- keep a list of all the medicines I take; vitamins and herbals too ?- use a pillbox to sort medicine  ?  ?Why is this important?   ?These steps will help you keep on track with your medicines. ? ? ?Track and Manage My Blood Pressure  ? ?Timeframe:  Long-Range Goal ?Priority:  High ?Start Date:     04/10/21                        ?Expected End Date:  01/17/2023                   ? ?Follow Up Date 07/2022 ?  ?- check blood pressure daily ?- choose a place to take my blood pressure (home, clinic or office, retail store) ?- write blood pressure results in a log or diary  ?  ?Why is this important?   ?You won't feel high blood pressure, but it can still hurt your blood vessels.  ?High blood pressure can cause heart or kidney problems. It can also cause a stroke.  ?Making lifestyle changes like losing a little weight or eating less salt will help.  ?Checking your blood pressure at home and at different times of the day can help to control blood pressure.  ?If the doctor prescribes medicine remember to take it the way the doctor ordered.  ?Call the office if you cannot afford the medicine or if there are questions about it.  ? ?Plan: Telephone follow up appointment with care management team member scheduled for:  3 months ?The patient has been provided with contact information for the care management team and has been advised to call with any health related questions or concerns.  ? ?Tomasa Blase, PharmD ?Clinical Pharmacist, [place]  ? ?Please call the care guide team at [phone] if you need to cancel or reschedule your appointment.   ? ?Patient verbalizes understanding of instructions and care plan provided today and agrees to view in [place]. Active MyChart status confirmed with patient.   ? ?

## 2022-04-16 NOTE — Progress Notes (Signed)
?Chronic Care Management ?Pharmacy Note ? ?04/16/2022 ?Name:  Veronica Garza MRN:  [id number] DOB:  04-17-1941 ? ?Summary: ?-Patient reports that since last visit, has not been taking crestor as she felt it was possibly contributing to her vertigo  ?-Endorses compliance with all of her other medications - has been checking BP at home on occasion, averaging 130-150's-70's - notes that she does not typically rest / relax prior to home readings  ?-reports that vertigo has been improving - had follow up with audiology in March - no recent changes to medications  ? ?Recommendations/Changes made from today's visit: ?- Recommending for patient to continue monitoring BP -write BP's in log - advised patient of appropriate procedure for home BP monitoring, to start sitting/ relaxing prior to BP measurement with both feet on the floor and comfortably seated - to reach out should BP average >140/90 ?-Advised for patient to restart rosuvastatin, would recommend to recheck lipid panel / LFTs in 4-6 weeks after restarting  ? ? ?Subjective: ?Veronica Garza is an 81 y.o. year old female who is a primary patient of Dr Scarlette Calico. The CCM team was consulted for assistance with disease management and care coordination needs.   ? ?Engaged with patient by telephone for follow up visit in response to provider referral for pharmacy case management and/or care coordination services.  ? ?Consent to Services:  ?The patient was given information about Chronic Care Management services, agreed to services, and gave verbal consent prior to initiation of services.  Please see initial visit note for detailed documentation.  ? ?Patient Care Team: ?Hoyt Koch, MD as PCP - General (Internal Medicine) ?Rigoberto Noel, MD as Consulting Physician (Pulmonary Disease) ?Mauri Pole, MD as Consulting Physician (Gastroenterology) ?Tomasa Blase, [facility] as Pharmacist (Pharmacist) ? ?Recent office visits: ?01/29/2022 - Dr. Sharlet Salina - TOC -  no changes to medications  ? ?Recent consult visits: ?02/06/2022 - Dr. Redmond Baseman - Audiology - no changes to medications - referred for further evaluation of inner ears with vestibular testing at [facility]  ?02/18/2022 - Dr. Marta Antu - vertigo - note not available at this time  ? ?Hospital visits: ?None since last visit  ? ? ?Objective: ? ?Lab Results  ?Component Value Date  ? CREATININE 1.25 (H) 01/29/2022  ? BUN 17 01/29/2022  ? GFR 41.07 (L) 01/29/2022  ? GFRNONAA >60 04/26/2021  ? GFRAA 71 08/17/2020  ? NA 138 01/29/2022  ? K 4.0 01/29/2022  ? CALCIUM 9.5 01/29/2022  ? CO2 29 01/29/2022  ? ? ?Lab Results  ?Component Value Date/Time  ? HGBA1C 5.8 01/29/2022 02:02 PM  ? HGBA1C 6.0 08/22/2021 10:16 AM  ? GFR 41.07 (L) 01/29/2022 02:02 PM  ? GFR 63.64 08/22/2021 10:16 AM  ?  ?Last diabetic Eye exam: No results found for: HMDIABEYEEXA  ?Last diabetic Foot exam: No results found for: HMDIABFOOTEX  ? ?Lab Results  ?Component Value Date  ? CHOL 147 08/22/2021  ? HDL 56.00 08/22/2021  ? [place] 60 08/22/2021  ? LDLDIRECT 119.5 09/04/2012  ? TRIG 154.0 (H) 08/22/2021  ? CHOLHDL 3 08/22/2021  ? ? ? ?  Latest Ref Rng & Units 01/29/2022  ?  2:02 PM 08/22/2021  ? 10:16 AM 03/15/2021  ? 10:04 AM  ?Hepatic Function  ?Total Protein 6.0 - 8.3 g/dL 7.2   7.6   6.7    ?Albumin 3.5 - 5.2 g/dL 4.4   4.2   3.8    ?AST 0 - 37 U/L 21  30   16    ?ALT 0 - 35 U/L 16   74   12    ?Alk Phosphatase 39 - 117 U/L 91   227   88    ?Total Bilirubin 0.2 - 1.2 mg/dL 0.7   0.6   0.6    ?Bilirubin, Direct 0.0 - 0.3 mg/dL  0.1     ? ? ?Lab Results  ?Component Value Date/Time  ? TSH 1.80 08/22/2021 10:16 AM  ? TSH 1.79 08/17/2020 03:45 PM  ? ? ? ?  Latest Ref Rng & Units 08/22/2021  ? 10:16 AM 04/26/2021  ? 11:46 AM 03/19/2021  ?  4:43 PM  ?CBC  ?WBC 4.0 - 10.5 K/uL 8.8   10.3   5.7    ?Hemoglobin 12.0 - 15.0 g/dL 12.7   11.4   11.2    ?Hematocrit 36.0 - 46.0 % 38.0   34.3   33.1    ?Platelets 150.0 - 400.0 K/uL 203.0   174   179    ? ? ?No results found for:  VD25OH ? ?Clinical ASCVD: No  ?The 10-year ASCVD risk score (Arnett DK, et al., 2019) is: 32.5% ?  Values used to calculate the score: ?    Age: 81 years ?    Sex: Female ?    Is Non-Hispanic African American: No ?    Diabetic: No ?    Tobacco smoker: No ?    Systolic Blood Pressure: 270 mmHg ?    Is BP treated: Yes ?    HDL Cholesterol: 56 mg/dL ?    Total Cholesterol: 147 mg/dL   ? ? ?  08/22/2021  ?  9:03 AM 08/17/2020  ?  2:39 PM 06/22/2019  ?  2:21 PM  ?Depression screen PHQ 2/9  ?Decreased Interest 0 0 0  ?Down, Depressed, Hopeless 0 0 0  ?PHQ - 2 Score 0 0 0  ?  ?Social History  ? ?Tobacco Use  ?Smoking Status Never  ?Smokeless Tobacco Never  ? ?BP Readings from Last 3 Encounters:  ?01/29/22 130/66  ?12/07/21 (!) 206/85  ?09/12/21 134/76  ? ?Pulse Readings from Last 3 Encounters:  ?01/29/22 80  ?12/07/21 72  ?09/12/21 69  ? ?Wt Readings from Last 3 Encounters:  ?01/29/22 208 lb 9.6 oz (94.6 kg)  ?12/06/21 205 lb (93 kg)  ?09/12/21 207 lb (93.9 kg)  ? ?BMI Readings from Last 3 Encounters:  ?01/29/22 42.13 kg/m?  ?12/06/21 41.40 kg/m?  ?09/12/21 41.81 kg/m?  ? ? ? ?Assessment/Interventions: Review of patient past medical history, allergies, medications, health status, including review of consultants reports, laboratory and other test data, was performed as part of comprehensive evaluation and provision of chronic care management services.  ? ?SDOH:  (Social Determinants of Health) assessments and interventions performed: Yes ? ?SDOH Screenings  ? ?Alcohol Screen: Not on file  ?Depression (PHQ2-9): Low Risk   ? PHQ-2 Score: 0  ?Financial Resource Strain: Not on file  ?Food Insecurity: Not on file  ?Housing: Not on file  ?Physical Activity: Not on file  ?Social Connections: Not on file  ?Stress: Not on file  ?Tobacco Use: Low Risk   ? Smoking Tobacco Use: Never  ? Smokeless Tobacco Use: Never  ? Passive Exposure: Not on file  ?Transportation Needs: Not on file  ? ? ?[place] ? ?Allergies  ?Allergen Reactions  ?  Tolterodine Other (See Comments)  ?  dizziness  ? Iohexol Hives  ?   Code: HIVES,  Desc: ? Contrast reaction from CT prior to 2000. ?  ? Naproxen Itching and Rash  ? Neomycin-Bacitracin Zn-Polymyx Itching and Rash  ?  REACTION: redness  ? Sulfonamide Derivatives Itching and Rash  ?  tingling  ? ? ?Medications Reviewed Today   ? ? Reviewed by Tomasa Blase, [facility] (Pharmacist) on 04/16/22 at 1111  Med List Status: <None>  ? ?Medication Order Taking? Sig Documenting Provider Last Dose Status Informant  ?acetaminophen (TYLENOL) 650 MG CR tablet [id number] Yes Take 650-1,300 mg by mouth every 8 (eight) hours as needed for pain. [provider] Taking Active Self  ?Calcium Carb-Cholecalciferol (CALTRATE 600+D3 PO) [id number] Yes Take 1 tablet by mouth in the morning and at bedtime. [provider] Taking Active Self  ?hydrALAZINE (APRESOLINE) 50 MG tablet [id number] Yes Take 1 tablet (50 mg total) by mouth 3 (three) times daily. Janith Lima, MD Taking Active   ?irbesartan (AVAPRO) 300 MG tablet [id number] Yes Take 1 tablet (300 mg total) by mouth every evening.  ?Patient taking differently: Take 150 mg by mouth in the morning and at bedtime.  ? Janith Lima, MD Taking Active   ?Multiple Vitamins-Minerals (MULTIVITAMIN WITH MINERALS) tablet [id number] Yes Take 1 tablet by mouth in the morning. Centrum [provider] Taking Active Self  ?Multiple Vitamins-Minerals (PRESERVISION AREDS 2 PO) [id number] Yes Take 1 capsule by mouth 2 (two) times daily. [provider] Taking Active Self  ?omeprazole (PRILOSEC) 40 MG capsule [id number] Yes TAKE ONE CAPSULE BY MOUTH DAILY  ?Patient taking differently: Take 40 mg by mouth in the morning.  ? Mauri Pole, MD Taking Active   ?PARoxetine (PAXIL) 10 MG tablet [id number] Yes TAKE ONE TABLET BY MOUTH DAILY Janith Lima, MD Taking Active   ?Propylene Glycol (SYSTANE BALANCE) 0.6 % SOLN [id number] Yes Place 1 drop into both eyes 3 (three) times  daily as needed (dry eyes). [provider] Taking Active Self  ?rosuvastatin (CRESTOR) 20 MG tablet [id number] Yes TAKE ONE TABLET BY MOUTH ONCE DAILY IN THE MORNING Janith Lima, MD Cyndie Chime

## 2022-04-18 ENCOUNTER — Other Ambulatory Visit: Payer: Self-pay

## 2022-04-18 DIAGNOSIS — N951 Menopausal and female climacteric states: Secondary | ICD-10-CM

## 2022-04-18 MED ORDER — PAROXETINE HCL 10 MG PO TABS: 10.0000 mg | ORAL_TABLET | Freq: Every day | ORAL | 1 refills | Status: DC

## 2022-04-26 ENCOUNTER — Other Ambulatory Visit: Payer: Self-pay | Admitting: Internal Medicine

## 2022-04-26 DIAGNOSIS — I10 Essential (primary) hypertension: Secondary | ICD-10-CM

## 2022-05-01 DIAGNOSIS — I10 Essential (primary) hypertension: Secondary | ICD-10-CM

## 2022-05-01 DIAGNOSIS — E785 Hyperlipidemia, unspecified: Secondary | ICD-10-CM

## 2022-06-18 DIAGNOSIS — M2669 Other specified disorders of temporomandibular joint: Secondary | ICD-10-CM | POA: Diagnosis not present

## 2022-06-18 DIAGNOSIS — H9202 Otalgia, left ear: Secondary | ICD-10-CM | POA: Diagnosis not present

## 2022-09-12 ENCOUNTER — Other Ambulatory Visit: Payer: Self-pay | Admitting: Internal Medicine

## 2022-09-12 DIAGNOSIS — E785 Hyperlipidemia, unspecified: Secondary | ICD-10-CM

## 2022-09-13 ENCOUNTER — Other Ambulatory Visit: Payer: Self-pay | Admitting: Internal Medicine

## 2022-09-13 DIAGNOSIS — E785 Hyperlipidemia, unspecified: Secondary | ICD-10-CM

## 2022-09-19 DIAGNOSIS — H524 Presbyopia: Secondary | ICD-10-CM | POA: Diagnosis not present

## 2022-09-19 DIAGNOSIS — H04123 Dry eye syndrome of bilateral lacrimal glands: Secondary | ICD-10-CM | POA: Diagnosis not present

## 2022-09-19 DIAGNOSIS — H353131 Nonexudative age-related macular degeneration, bilateral, early dry stage: Secondary | ICD-10-CM | POA: Diagnosis not present

## 2022-09-19 DIAGNOSIS — H43813 Vitreous degeneration, bilateral: Secondary | ICD-10-CM | POA: Diagnosis not present

## 2022-09-19 DIAGNOSIS — Z961 Presence of intraocular lens: Secondary | ICD-10-CM | POA: Diagnosis not present

## 2022-09-24 DIAGNOSIS — Z23 Encounter for immunization: Secondary | ICD-10-CM | POA: Diagnosis not present

## 2022-09-30 ENCOUNTER — Other Ambulatory Visit: Payer: Self-pay | Admitting: Internal Medicine

## 2022-09-30 DIAGNOSIS — N3281 Overactive bladder: Secondary | ICD-10-CM

## 2022-09-30 DIAGNOSIS — I10 Essential (primary) hypertension: Secondary | ICD-10-CM

## 2022-11-01 DIAGNOSIS — R051 Acute cough: Secondary | ICD-10-CM | POA: Diagnosis not present

## 2022-11-01 DIAGNOSIS — J988 Other specified respiratory disorders: Secondary | ICD-10-CM | POA: Diagnosis not present

## 2022-11-01 DIAGNOSIS — R062 Wheezing: Secondary | ICD-10-CM | POA: Diagnosis not present

## 2022-11-01 DIAGNOSIS — B9689 Other specified bacterial agents as the cause of diseases classified elsewhere: Secondary | ICD-10-CM | POA: Diagnosis not present

## 2023-01-03 ENCOUNTER — Other Ambulatory Visit: Payer: Self-pay | Admitting: Internal Medicine

## 2023-01-03 DIAGNOSIS — I10 Essential (primary) hypertension: Secondary | ICD-10-CM

## 2023-01-15 ENCOUNTER — Other Ambulatory Visit: Payer: Self-pay | Admitting: Internal Medicine

## 2023-01-15 DIAGNOSIS — N951 Menopausal and female climacteric states: Secondary | ICD-10-CM

## 2023-03-13 ENCOUNTER — Other Ambulatory Visit: Payer: Self-pay | Admitting: Internal Medicine

## 2023-03-13 DIAGNOSIS — E785 Hyperlipidemia, unspecified: Secondary | ICD-10-CM

## 2023-03-26 ENCOUNTER — Other Ambulatory Visit: Payer: Self-pay | Admitting: Internal Medicine

## 2023-03-26 DIAGNOSIS — N3281 Overactive bladder: Secondary | ICD-10-CM

## 2023-03-26 DIAGNOSIS — I10 Essential (primary) hypertension: Secondary | ICD-10-CM

## 2023-04-11 ENCOUNTER — Telehealth: Payer: Self-pay | Admitting: *Deleted

## 2023-04-11 ENCOUNTER — Ambulatory Visit (INDEPENDENT_AMBULATORY_CARE_PROVIDER_SITE_OTHER): Payer: Medicare Other | Admitting: *Deleted

## 2023-04-11 VITALS — Ht 59.0 in | Wt 205.0 lb

## 2023-04-11 DIAGNOSIS — Z0000 Encounter for general adult medical examination without abnormal findings: Secondary | ICD-10-CM | POA: Diagnosis not present

## 2023-04-11 NOTE — Telephone Encounter (Signed)
Would need to discuss with patient at visit about memory to give advice about if a treatment would be effective.

## 2023-04-11 NOTE — Patient Instructions (Signed)

## 2023-04-11 NOTE — Telephone Encounter (Signed)
Patient had [facility] appt today, she is having some issues with constipation and was wondering if the preservision is causing it. She also wants to know if this multi vitamin will help with her memory.   I advised patient on scheduling a physical with provider as she has not been seen since 2023, she states she will call back to schedule

## 2023-04-11 NOTE — Progress Notes (Signed)
Subjective:   Veronica Garza is a 82 y.o. female who presents for Medicare Annual (Subsequent) preventive examination. I connected with  Daphine Deutscher on 04/11/23 by a audio enabled telemedicine application and verified that I am speaking with the correct person using two identifiers.  Patient Location: Home  Provider location: Office  I discussed the limitations of evaluation and management by telemedicine. The patient expressed understanding and agreed to proceed.  Review of Systems    Defer to PCP Cardiac Risk Factors include: advanced age (>55men, >38 women);obesity (BMI >30kg/m2);sedentary lifestyle;hypertension     Objective:    Today's Vitals   04/11/23 1115  Weight: 205 lb (93 kg)  Height: 4\' 11"  (1.499 m)   Body mass index is 41.4 kg/m.     04/11/2023   11:20 AM 12/06/2021    6:27 PM 04/26/2021   11:14 AM 08/17/2019    4:32 PM 08/12/2019   12:08 PM 06/22/2019    2:18 PM 09/22/2018    2:56 PM  Advanced Directives  Does Patient Have a Medical Advance Directive? Yes Yes No Yes Yes Yes Yes  Type of Estate agent of [place];Living will   Healthcare Power of [place];Living will Healthcare Power of [place];Living will Healthcare Power of [place];Living will Healthcare Power of [place];Living will  Does patient want to make changes to medical advance directive?    No - Patient declined No - Patient declined    Copy of Healthcare Power of Attorney in Chart?    No - copy requested No - copy requested No - copy requested   Would patient like information on creating a medical advance directive?   No - Patient declined        Current Medications (verified) Outpatient Encounter Medications as of 04/11/2023  Medication Sig   acetaminophen (TYLENOL) 650 MG CR tablet Take 650-1,300 mg by mouth every 8 (eight) hours as needed for pain.   Calcium Carb-Cholecalciferol (CALTRATE 600+D3 PO) Take 1 tablet by mouth in the morning and at bedtime.   hydrALAZINE  (APRESOLINE) 50 MG tablet Take 1 tablet (50 mg total) by mouth 3 (three) times daily.   irbesartan (AVAPRO) 300 MG tablet Take 1 tablet (300 mg total) by mouth every evening. Annual appt is due in must see provider for future refills   Multiple Vitamins-Minerals (MULTIVITAMIN WITH MINERALS) tablet Take 1 tablet by mouth in the morning. Centrum   Multiple Vitamins-Minerals (PRESERVISION AREDS 2 PO) Take 1 capsule by mouth 2 (two) times daily.   omeprazole (PRILOSEC) 40 MG capsule TAKE ONE CAPSULE BY MOUTH DAILY (Patient taking differently: Take 40 mg by mouth in the morning.)   PARoxetine (PAXIL) 10 MG tablet Take 1 tablet (10 mg total) by mouth daily. Annual appt is due must see provider for future refills   Propylene Glycol (SYSTANE BALANCE) 0.6 % SOLN Place 1 drop into both eyes 3 (three) times daily as needed (dry eyes).   rosuvastatin (CRESTOR) 20 MG tablet TAKE ONE TABLET BY MOUTH ONCE DAILY IN THE MORNING   solifenacin (VESICARE) 10 MG tablet TAKE ONE TABLET BY MOUTH EVERY DAY   TURMERIC PO Take 1,000 mg by mouth in the morning.   No facility-administered encounter medications on file as of 04/11/2023.    Allergies (verified) Tolterodine, Iohexol, Naproxen, Neomycin-bacitracin zn-polymyx, and Sulfonamide derivatives   History: Past Medical History:  Diagnosis Date   Anemia    Arthritis    BCC (basal cell carcinoma) 07/19/2014   Right cheek  Cataract    Complication of anesthesia    takes patient a long time to wake up   GERD (gastroesophageal reflux disease)    History of hiatal hernia    Hx of shoulder replacement    lt   Hypertension    OSA (obstructive sleep apnea)    use CPAP   Pneumonia    History of   Urinary incontinence    Past Surgical History:  Procedure Laterality Date   ABDOMINAL HYSTERECTOMY     EYE SURGERY     bilateral cataract extraction   FINGER SURGERY     cyst left middle finger   JOINT REPLACEMENT     left shoulder   LUMBAR  LAMINECTOMY/DECOMPRESSION MICRODISCECTOMY Left 05/02/2021   Procedure: Laminectomy and Foraminotomy - Lumbar four-Lumbar five - left;  Surgeon: Tia Alert, MD;  Location: [facility] OR;  Service: Neurosurgery;  Laterality: Left;   NASAL SINUS SURGERY     SHOULDER ARTHROSCOPY     Left   TONSILLECTOMY     removed as a child   TOTAL KNEE ARTHROPLASTY Right 08/17/2019   Procedure: TOTAL KNEE ARTHROPLASTY;  Surgeon: Durene Romans, MD;  Location: [facility] ORS;  Service: Orthopedics;  Laterality: Right;  70 mins   TOTAL SHOULDER ARTHROPLASTY  10/10/2011   Procedure: TOTAL SHOULDER ARTHROPLASTY;  Surgeon: Vania Rea Supple;  Location: [facility] OR;  Service: Orthopedics;  Laterality: Right;   UPPER GASTROINTESTINAL ENDOSCOPY     Vaginal Sling     Family History  Problem Relation Age of Onset   Cancer Father        Prostate cancer   Cancer Sister        Ovarian cancer   Diabetes Sister    Alzheimer's disease Sister    Alzheimer's disease Mother    COPD Neg Hx    Alcohol abuse Neg Hx    Early death Neg Hx    Heart disease Neg Hx    Hyperlipidemia Neg Hx    Hypertension Neg Hx    Kidney disease Neg Hx    Stroke Neg Hx    Social History   Socioeconomic History   Marital status: Married    Spouse name: Not on file   Number of children: 1   Years of education: Not on file   Highest education level: Not on file  Occupational History   Occupation: Retired  Tobacco Use   Smoking status: Never   Smokeless tobacco: Never  Vaping Use   Vaping Use: Never used  Substance and Sexual Activity   Alcohol use: No   Drug use: No   Sexual activity: Not Currently  Other Topics Concern   Not on file  Social History Narrative   Regular exercise-Yes   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   Social Determinants of Health   Financial Resource Strain: Low Risk  (04/11/2023)   Overall Financial Resource Strain (CARDIA)    Difficulty of Paying Living Expenses: Not very hard  Food Insecurity: No Food Insecurity (04/11/2023)    Hunger Vital Sign    Worried About Running Out of Food in the Last Year: Never true    Ran Out of Food in the Last Year: Never true  Transportation Needs: No Transportation Needs (04/11/2023)   PRAPARE - Administrator, Civil Service (Medical): No    Lack of Transportation (Non-Medical): No  Physical Activity: Inactive (04/11/2023)   Exercise Vital Sign    Days of Exercise per Week: 0 days  Minutes of Exercise per Session: 0 min  Stress: No Stress Concern Present (04/11/2023)   [employer] of Occupational Health - Occupational Stress Questionnaire    Feeling of Stress : Not at all  Social Connections: Moderately Integrated (04/11/2023)   Social Connection and Isolation Panel [NHANES]    Frequency of Communication with Friends and Family: More than three times a week    Frequency of Social Gatherings with Friends and Family: More than three times a week    Attends Religious Services: More than 4 times per year    Active Member of [employer] or Organizations: No    Attends Engineer, structural: Never    Marital Status: Married    Tobacco Counseling Counseling given: Not Answered   Clinical Intake:     Pain : No/denies pain     Nutritional Status: BMI > 30  Obese Nutritional Risks: None Diabetes: No  How often do you need to have someone help you when you read instructions, pamphlets, or other written materials from your doctor or pharmacy?: 1 - Never What is the last grade level you completed in school?: 12 th grade  Diabetic?no  Interpreter Needed?: No      Activities of Daily Living    04/11/2023   11:28 AM 04/11/2023   11:24 AM  In your present state of health, do you have any difficulty performing the following activities:  Hearing?  1  Comment  patient has hearing aids  Vision?  0  Difficulty concentrating or making decisions?  0  Walking or climbing stairs?  1  Comment  coming down stair are difficulty  Dressing or bathing?  0  Doing  errands, shopping?  0  Preparing Food and eating ? N   Using the Toilet? N   In the past six months, have you accidently leaked urine? N   Do you have problems with loss of bowel control? Y   Comment more constipated as usual   Managing your Medications? N   Managing your Finances? N   Housekeeping or managing your Housekeeping? N     Patient Care Team: Myrlene Broker, MD as PCP - General (Internal Medicine) Oretha Milch, MD as Consulting Physician (Pulmonary Disease) Napoleon Form, MD as Consulting Physician (Gastroenterology) Szabat, Vinnie Level, [facility] (Inactive) as Pharmacist (Pharmacist)  Indicate any recent Medical Services you may have received from other than [facility] providers in the past year (date may be approximate).     Assessment:   This is a routine wellness examination for [facility].  Hearing/Vision screen Vision Screening - Comments:: Annual eye exam  Dietary issues and exercise activities discussed: Current Exercise Habits: The patient does not participate in regular exercise at present, Exercise limited by: None identified   Goals Addressed   None   Depression Screen    04/11/2023   11:19 AM 08/22/2021    9:03 AM 08/17/2020    2:39 PM 06/22/2019    2:21 PM 05/19/2019    1:31 PM 01/01/2018   10:00 AM 01/07/2017   11:51 AM  PHQ 2/9 Scores  PHQ - 2 Score 0 0 0 0 0 0 0  PHQ- 9 Score      2     Fall Risk    04/11/2023   11:19 AM 01/29/2022    1:29 PM 08/22/2021    9:03 AM 08/17/2020    2:38 PM 06/22/2019    2:21 PM  Fall Risk   Falls in the past year? 0  1 0 0 0  Number falls in past yr: 0 0  0 0  Injury with Fall? 0 1  0   Risk for fall due to : No Fall Risks   Impaired balance/gait Impaired vision;Impaired balance/gait;Impaired mobility  Follow up Falls evaluation completed   Falls evaluation completed Falls prevention discussed;Education provided    FALL RISK PREVENTION PERTAINING TO THE HOME:  Any stairs in or around the home? Yes  If so, are  there any without handrails? Yes  Home free of loose throw rugs in walkways, pet beds, electrical cords, etc? Yes  Adequate lighting in your home to reduce risk of falls? Yes   ASSISTIVE DEVICES UTILIZED TO PREVENT FALLS:  Life alert? No  Use of a cane, walker or w/c? Yes  Grab bars in the bathroom? Yes  Shower chair or bench in shower? Yes  Elevated toilet seat or a handicapped toilet? No   TIMED UP AND GO:  Was the test performed? No .  Length of time to ambulate 10 feet: n/a sec.     Cognitive Function:    01/01/2018   10:31 AM  MMSE - Mini Mental State Exam  Orientation to time 5  Orientation to Place 5  Registration 3  Attention/ Calculation 5  Recall 2  Language- name 2 objects 2  Language- repeat 1  Language- follow 3 step command 3  Language- read & follow direction 1  Write a sentence 1  Copy design 1  Total score 29        04/11/2023   11:38 AM  6CIT Screen  What Year? 0 points  What month? 0 points  What time? 0 points  Count back from 20 0 points  Months in reverse 0 points  Repeat phrase 2 points  Total Score 2 points    Immunizations Immunization History  Administered Date(s) Administered   Fluad Quad(high Dose 65+) 09/10/2019, 08/17/2020, 09/12/2021   Influenza Split 09/04/2012   Influenza Whole 08/30/2009, 09/18/2010, 08/08/2011   Influenza, High Dose Seasonal PF 08/30/2014, 09/02/2016, 09/11/2017, 09/22/2018   Influenza,inj,Quad PF,6+ Mos 09/02/2013, 09/01/2015   Moderna Covid-19 Vaccine Bivalent Booster 37yrs & up 10/18/2021   Moderna Sars-Covid-2 Vaccination 12/16/2019, 01/14/2020, 10/07/2020   Pneumococcal Conjugate-13 01/01/2018   Pneumococcal Polysaccharide-23 05/19/2019   Td 04/06/2010   Td (Adult), 2 Lf Tetanus Toxid, Preservative Free 04/06/2010   Tdap 07/19/2019   Zoster Recombinat (Shingrix) 12/11/2019, 03/10/2020   Zoster, Live 09/04/2012    TDAP status: Up to date  Flu Vaccine status: Up to date  Pneumococcal vaccine  status: Up to date  Covid-19 vaccine status: Completed vaccines  Qualifies for Shingles Vaccine? Yes   Zostavax completed Yes   Shingrix Completed?: Yes  Screening Tests Health Maintenance  Topic Date Due   COVID-19 Vaccine (5 - 2023-24 season) 08/02/2022   INFLUENZA VACCINE  07/03/2023   Medicare Annual Wellness (AWV)  04/10/2024   DTaP/Tdap/Td (4 - Td or Tdap) 07/18/2029   Pneumonia Vaccine 22+ Years old  Completed   DEXA SCAN  Completed   Zoster Vaccines- Shingrix  Completed   HPV VACCINES  Aged Out    Health Maintenance  Health Maintenance Due  Topic Date Due   COVID-19 Vaccine (5 - 2023-24 season) 08/02/2022    Colorectal cancer screening: No longer required.   Mammogram status: No longer required due to age.  Bone Density status: Completed 11/05/2019. Results reflect: Bone density results: OSTEOPENIA. Repeat every unknown years.  Lung Cancer Screening: (Low Dose CT  Chest recommended if Age 66-80 years, 30 pack-year currently smoking OR have quit w/in 15years.) does not qualify.   Lung Cancer Screening Referral: n/a  Additional Screening:  Hepatitis C Screening: does not qualify; Completed n/a  Vision Screening: Recommended annual ophthalmology exams for early detection of glaucoma and other disorders of the eye. Is the patient up to date with their annual eye exam?  Yes  Who is the provider or what is the name of the office in which the patient attends annual eye exams? Dr. Sherrine Maples If pt is not established with a provider, would they like to be referred to a provider to establish care? No .   Dental Screening: Recommended annual dental exams for proper oral hygiene  Community Resource Referral / Chronic Care Management: CRR required this visit?  No   CCM required this visit?  No      Plan:     I have personally reviewed and noted the following in the patient's chart:   Medical and social history Use of alcohol, tobacco or illicit drugs  Current  medications and supplements including opioid prescriptions. Patient is not currently taking opioid prescriptions. Functional ability and status Nutritional status Physical activity Advanced directives List of other physicians Hospitalizations, surgeries, and ER visits in previous 12 months Vitals Screenings to include cognitive, depression, and falls Referrals and appointments  In addition, I have reviewed and discussed with patient certain preventive protocols, quality metrics, and best practice recommendations. A written personalized care plan for preventive services as well as general preventive health recommendations were provided to patient.     Tamela Oddi, CMA   04/11/2023  Non face to face 45 min  Nurse Notes:  Ms. Divita , Thank you for taking time to come for your Medicare Wellness Visit. I appreciate your ongoing commitment to your health goals. Please review the following plan we discussed and let me know if I can assist you in the future.   These are the goals we discussed:  Goals      Manage My Medicine     Timeframe:  Long-Range Goal Priority:  Medium Start Date:     04/10/21                        Expected End Date:  01/17/2023                    Follow Up Date 07/2022   - call for medicine refill 2 or 3 days before it runs out - call if I am sick and can't take my medicine - keep a list of all the medicines I take; vitamins and herbals too - use a pillbox to sort medicine    Why is this important?   These steps will help you keep on track with your medicines.      Track and Manage My Blood Pressure-Hypertension     Timeframe:  Long-Range Goal Priority:  High Start Date:     04/10/21                        Expected End Date:  01/17/2023                    Follow Up Date 07/2022   - check blood pressure daily - choose a place to take my blood pressure (home, clinic or office, retail store) - write blood pressure results in a log or diary  Why is  this important?   You won't feel high blood pressure, but it can still hurt your blood vessels.  High blood pressure can cause heart or kidney problems. It can also cause a stroke.  Making lifestyle changes like losing a little weight or eating less salt will help.  Checking your blood pressure at home and at different times of the day can help to control blood pressure.  If the doctor prescribes medicine remember to take it the way the doctor ordered.  Call the office if you cannot afford the medicine or if there are questions about it.     Notes:         This is a list of the screening recommended for you and due dates:  Health Maintenance  Topic Date Due   COVID-19 Vaccine (5 - 2023-24 season) 08/02/2022   Flu Shot  07/03/2023   Medicare Annual Wellness Visit  04/10/2024   DTaP/Tdap/Td vaccine (4 - Td or Tdap) 07/18/2029   Pneumonia Vaccine  Completed   DEXA scan (bone density measurement)  Completed   Zoster (Shingles) Vaccine  Completed   HPV Vaccine  Aged Out

## 2023-04-11 NOTE — Telephone Encounter (Signed)
Called patient back . She has an appt with Dr. Okey Dupre on 5/15

## 2023-04-16 ENCOUNTER — Ambulatory Visit (INDEPENDENT_AMBULATORY_CARE_PROVIDER_SITE_OTHER): Payer: Medicare Other | Admitting: Internal Medicine

## 2023-04-16 ENCOUNTER — Encounter: Payer: Self-pay | Admitting: Internal Medicine

## 2023-04-16 VITALS — BP 160/100 | Temp 98.3°F | Ht 59.0 in | Wt 210.0 lb

## 2023-04-16 DIAGNOSIS — K5900 Constipation, unspecified: Secondary | ICD-10-CM | POA: Diagnosis not present

## 2023-04-16 DIAGNOSIS — E785 Hyperlipidemia, unspecified: Secondary | ICD-10-CM

## 2023-04-16 DIAGNOSIS — K222 Esophageal obstruction: Secondary | ICD-10-CM

## 2023-04-16 DIAGNOSIS — N951 Menopausal and female climacteric states: Secondary | ICD-10-CM

## 2023-04-16 DIAGNOSIS — N3281 Overactive bladder: Secondary | ICD-10-CM | POA: Diagnosis not present

## 2023-04-16 DIAGNOSIS — I10 Essential (primary) hypertension: Secondary | ICD-10-CM | POA: Diagnosis not present

## 2023-04-16 DIAGNOSIS — J849 Interstitial pulmonary disease, unspecified: Secondary | ICD-10-CM

## 2023-04-16 DIAGNOSIS — D539 Nutritional anemia, unspecified: Secondary | ICD-10-CM

## 2023-04-16 DIAGNOSIS — R739 Hyperglycemia, unspecified: Secondary | ICD-10-CM | POA: Diagnosis not present

## 2023-04-16 DIAGNOSIS — F3342 Major depressive disorder, recurrent, in full remission: Secondary | ICD-10-CM | POA: Diagnosis not present

## 2023-04-16 LAB — LIPID PANEL
Cholesterol: 124 mg/dL (ref 0–200)
HDL: 51.5 mg/dL (ref >39.00)
LDL Cholesterol: 35 mg/dL (ref 0–99)
NonHDL: 72
Total CHOL/HDL Ratio: 2
Triglycerides: 184 mg/dL — ABNORMAL HIGH (ref 0.0–149.0)
VLDL: 36.8 mg/dL (ref 0.0–40.0)

## 2023-04-16 LAB — CBC
HCT: 36.2 % (ref 36.0–46.0)
Hemoglobin: 12.5 g/dL (ref 12.0–15.0)
MCHC: 34.5 g/dL (ref 30.0–36.0)
MCV: 92.1 fl (ref 78.0–100.0)
Platelets: 176 10*3/uL (ref 150.0–400.0)
RBC: 3.93 Mil/uL (ref 3.87–5.11)
RDW: 14.5 % (ref 11.5–15.5)
WBC: 5.3 10*3/uL (ref 4.0–10.5)

## 2023-04-16 LAB — COMPREHENSIVE METABOLIC PANEL
ALT: 16 U/L (ref 0–35)
AST: 22 U/L (ref 0–37)
Albumin: 4.2 g/dL (ref 3.5–5.2)
Alkaline Phosphatase: 85 U/L (ref 39–117)
BUN: 17 mg/dL (ref 6–23)
CO2: 30 mEq/L (ref 19–32)
Calcium: 9.5 mg/dL (ref 8.4–10.5)
Chloride: 102 mEq/L (ref 96–112)
Creatinine, Ser: 0.92 mg/dL (ref 0.40–1.20)
GFR: 58.82 mL/min — ABNORMAL LOW (ref >60.00)
Glucose, Bld: 103 mg/dL — ABNORMAL HIGH (ref 70–99)
Potassium: 4.5 mEq/L (ref 3.5–5.1)
Sodium: 139 mEq/L (ref 135–145)
Total Bilirubin: 0.7 mg/dL (ref 0.2–1.2)
Total Protein: 6.9 g/dL (ref 6.0–8.3)

## 2023-04-16 LAB — HEMOGLOBIN A1C: Hgb A1c MFr Bld: 6.1 % (ref 4.6–6.5)

## 2023-04-16 MED ORDER — ROSUVASTATIN CALCIUM 20 MG PO TABS: ORAL_TABLET | ORAL | 3 refills | Status: DC

## 2023-04-16 MED ORDER — FAMOTIDINE 20 MG PO TABS: 20.0000 mg | ORAL_TABLET | Freq: Every day | ORAL | 1 refills | Status: DC

## 2023-04-16 MED ORDER — SOLIFENACIN SUCCINATE 10 MG PO TABS: 10.0000 mg | ORAL_TABLET | Freq: Every day | ORAL | 3 refills | Status: DC

## 2023-04-16 MED ORDER — HYDRALAZINE HCL 50 MG PO TABS: 50.0000 mg | ORAL_TABLET | Freq: Three times a day (TID) | ORAL | 1 refills | Status: DC

## 2023-04-16 MED ORDER — PAROXETINE HCL 10 MG PO TABS: 10.0000 mg | ORAL_TABLET | Freq: Every day | ORAL | 3 refills | Status: DC

## 2023-04-16 MED ORDER — IRBESARTAN 300 MG PO TABS: 300.0000 mg | ORAL_TABLET | Freq: Every evening | ORAL | 3 refills | Status: AC

## 2023-04-16 NOTE — Progress Notes (Unsigned)
   Subjective:   Patient ID: Veronica Garza, female    DOB: [birth year], 82 y.o.   MRN: [id number]  HPI The patient is an 82 YO female coming in for follow up and some worsening concerns.  Review of Systems  Constitutional: Negative.   HENT: Negative.    Eyes: Negative.   Respiratory:  Negative for cough, chest tightness and shortness of breath.   Cardiovascular:  Negative for chest pain, palpitations and leg swelling.  Gastrointestinal:  Positive for nausea and vomiting. Negative for abdominal distention, abdominal pain, constipation and diarrhea.  Musculoskeletal: Negative.   Skin: Negative.   Neurological: Negative.   Psychiatric/Behavioral: Negative.      Objective:  Physical Exam Constitutional:      Appearance: She is well-developed. She is obese.  HENT:     Head: Normocephalic and atraumatic.  Cardiovascular:     Rate and Rhythm: Normal rate and regular rhythm.  Pulmonary:     Effort: Pulmonary effort is normal. No respiratory distress.     Breath sounds: Normal breath sounds. No wheezing or rales.  Abdominal:     General: Bowel sounds are normal. There is no distension.     Palpations: Abdomen is soft.     Tenderness: There is no abdominal tenderness. There is no rebound.  Musculoskeletal:     Cervical back: Normal range of motion.  Skin:    General: Skin is warm and dry.  Neurological:     Mental Status: She is alert and oriented to person, place, and time.     Coordination: Coordination normal.     Vitals:   04/16/23 1032 04/16/23 1037  BP: (!) 160/100 (!) 160/100  Temp: 98.3 F (36.8 C)   TempSrc: Oral   Weight: 210 lb (95.3 kg)   Height: 4\' 11"  (1.499 m)     Assessment & Plan:

## 2023-04-16 NOTE — Patient Instructions (Addendum)
We will check the labs today and got the medicine refilled.  We have added pepcid to take in the evening.

## 2023-04-17 ENCOUNTER — Encounter: Payer: Self-pay | Admitting: Internal Medicine

## 2023-04-17 DIAGNOSIS — F3342 Major depressive disorder, recurrent, in full remission: Secondary | ICD-10-CM | POA: Insufficient documentation

## 2023-04-17 DIAGNOSIS — K5900 Constipation, unspecified: Secondary | ICD-10-CM | POA: Insufficient documentation

## 2023-04-17 NOTE — Assessment & Plan Note (Signed)
Started when she started taking prevagen otc. She is now taking two otc medications for constipation but only having BM every week or so. Suspect this is impacting her GERD in a negative way with her constipation. Asked her to stop prevagen and work on constipation with miralax and diet to help.

## 2023-04-17 NOTE — Assessment & Plan Note (Signed)
Checking CBC for stability. Some blood with straining to use bathroom in the last few months due to constipation.

## 2023-04-17 NOTE — Assessment & Plan Note (Signed)
No change in SOB on exertion and no indication for medication at this time.

## 2023-04-17 NOTE — Assessment & Plan Note (Signed)
Taking vesicare 10 mg daily and overall satisfied with control. Will continue.

## 2023-04-17 NOTE — Assessment & Plan Note (Signed)
With more nausea and vomiting things up at time recently. Keep omeprazole 40 mg daily and add pepcid 20 mg qhs. If no improvement needs referral back to GI for likely endoscopy.

## 2023-04-17 NOTE — Assessment & Plan Note (Signed)
Checking lipid panel and adjust crestor 20 mg daily as needed.  

## 2023-04-17 NOTE — Assessment & Plan Note (Signed)
Taking paxil 10 mg daily and overall satisfied with control. She wishes to continue so refilled paxil 10 mg daily to continue.

## 2023-04-17 NOTE — Assessment & Plan Note (Signed)
Counseled on diet and exercise to help.  

## 2023-04-17 NOTE — Assessment & Plan Note (Signed)
Checking HgA1c and adjust as needed.  

## 2023-04-17 NOTE — Assessment & Plan Note (Signed)
BP moderately high today as she is out of her medications except 1 and cannot remember which one. Asked her to resume hydralazine 50 mg TID and irbesartan 300 mg daily and follow up in 1-2 months for BP recheck. Previously on goal with regimen. Checking CMP.

## 2023-07-18 ENCOUNTER — Other Ambulatory Visit: Payer: Self-pay | Admitting: Internal Medicine

## 2023-07-21 DIAGNOSIS — M1712 Unilateral primary osteoarthritis, left knee: Secondary | ICD-10-CM | POA: Diagnosis not present

## 2023-08-01 DIAGNOSIS — Z6841 Body Mass Index (BMI) 40.0 and over, adult: Secondary | ICD-10-CM | POA: Diagnosis not present

## 2023-08-01 DIAGNOSIS — M1712 Unilateral primary osteoarthritis, left knee: Secondary | ICD-10-CM | POA: Diagnosis not present

## 2023-09-17 DIAGNOSIS — Z23 Encounter for immunization: Secondary | ICD-10-CM | POA: Diagnosis not present

## 2023-09-24 DIAGNOSIS — Z961 Presence of intraocular lens: Secondary | ICD-10-CM | POA: Diagnosis not present

## 2023-09-24 DIAGNOSIS — H04123 Dry eye syndrome of bilateral lacrimal glands: Secondary | ICD-10-CM | POA: Diagnosis not present

## 2023-09-24 DIAGNOSIS — H353131 Nonexudative age-related macular degeneration, bilateral, early dry stage: Secondary | ICD-10-CM | POA: Diagnosis not present

## 2023-09-24 DIAGNOSIS — H524 Presbyopia: Secondary | ICD-10-CM | POA: Diagnosis not present

## 2023-09-24 DIAGNOSIS — H43813 Vitreous degeneration, bilateral: Secondary | ICD-10-CM | POA: Diagnosis not present

## 2023-10-09 DIAGNOSIS — H903 Sensorineural hearing loss, bilateral: Secondary | ICD-10-CM | POA: Diagnosis not present

## 2023-10-10 ENCOUNTER — Other Ambulatory Visit: Payer: Self-pay | Admitting: Internal Medicine

## 2023-10-10 DIAGNOSIS — I10 Essential (primary) hypertension: Secondary | ICD-10-CM

## 2023-12-01 DIAGNOSIS — Z96612 Presence of left artificial shoulder joint: Secondary | ICD-10-CM | POA: Diagnosis not present

## 2023-12-01 DIAGNOSIS — Z96611 Presence of right artificial shoulder joint: Secondary | ICD-10-CM | POA: Diagnosis not present

## 2023-12-01 DIAGNOSIS — J040 Acute laryngitis: Secondary | ICD-10-CM | POA: Diagnosis not present

## 2023-12-01 DIAGNOSIS — R059 Cough, unspecified: Secondary | ICD-10-CM | POA: Diagnosis not present

## 2023-12-01 DIAGNOSIS — J988 Other specified respiratory disorders: Secondary | ICD-10-CM | POA: Diagnosis not present

## 2023-12-01 DIAGNOSIS — B9689 Other specified bacterial agents as the cause of diseases classified elsewhere: Secondary | ICD-10-CM | POA: Diagnosis not present

## 2023-12-01 DIAGNOSIS — R051 Acute cough: Secondary | ICD-10-CM | POA: Diagnosis not present

## 2024-01-02 DIAGNOSIS — H9313 Tinnitus, bilateral: Secondary | ICD-10-CM | POA: Diagnosis not present

## 2024-01-02 DIAGNOSIS — R42 Dizziness and giddiness: Secondary | ICD-10-CM | POA: Diagnosis not present

## 2024-01-02 DIAGNOSIS — L2989 Other pruritus: Secondary | ICD-10-CM | POA: Diagnosis not present

## 2024-01-02 DIAGNOSIS — H6992 Unspecified Eustachian tube disorder, left ear: Secondary | ICD-10-CM | POA: Diagnosis not present

## 2024-01-19 ENCOUNTER — Other Ambulatory Visit: Payer: Self-pay | Admitting: Internal Medicine

## 2024-01-19 DIAGNOSIS — N951 Menopausal and female climacteric states: Secondary | ICD-10-CM

## 2024-02-12 ENCOUNTER — Other Ambulatory Visit: Payer: Self-pay | Admitting: Internal Medicine

## 2024-02-12 DIAGNOSIS — N3281 Overactive bladder: Secondary | ICD-10-CM

## 2024-04-12 ENCOUNTER — Ambulatory Visit

## 2024-04-27 ENCOUNTER — Other Ambulatory Visit: Payer: Self-pay

## 2024-04-27 ENCOUNTER — Other Ambulatory Visit: Payer: Self-pay | Admitting: Internal Medicine

## 2024-04-27 DIAGNOSIS — E785 Hyperlipidemia, unspecified: Secondary | ICD-10-CM

## 2024-04-27 MED ORDER — ROSUVASTATIN CALCIUM 20 MG PO TABS: ORAL_TABLET | ORAL | 3 refills | Status: AC

## 2024-05-04 ENCOUNTER — Ambulatory Visit (INDEPENDENT_AMBULATORY_CARE_PROVIDER_SITE_OTHER)

## 2024-05-04 VITALS — Ht 59.0 in | Wt 210.0 lb

## 2024-05-04 DIAGNOSIS — Z780 Asymptomatic menopausal state: Secondary | ICD-10-CM | POA: Diagnosis not present

## 2024-05-04 DIAGNOSIS — Z0000 Encounter for general adult medical examination without abnormal findings: Secondary | ICD-10-CM | POA: Diagnosis not present

## 2024-05-04 NOTE — Patient Instructions (Signed)
 Veronica Garza , Thank you for taking time out of your busy schedule to complete your Annual Wellness Visit with me. I enjoyed our conversation and look forward to speaking with you again next year. I, as well as your care team,  appreciate your ongoing commitment to your health goals. Please review the following plan we discussed and let me know if I can assist you in the future. Your Game plan/ To Do List    Referrals: If you haven't heard from the office you've been referred to, please reach out to them at the phone provided.  You have an order for:   [x]   Bone Density     Please call for appointment:  [facility] [address] [place], [place] [address] [phone]    Make sure to wear two-piece clothing.  No lotions, powders, or deodorants the day of the appointment. Make sure to bring picture ID and insurance card.  Bring list of medications you are currently taking including any supplements.   Follow up Visits: Next Medicare AWV with our clinical staff: 05/06/2025.   Have you seen your provider in the last 6 months (3 months if uncontrolled diabetes)? No Next Office Visit with your provider: Patient stated that she will have call the office to schedule her next office.    Clinician Recommendations:  Aim for 30 minutes of exercise or brisk walking, 6-8 glasses of water , and 5 servings of fruits and vegetables each day. Keep up the good work.      This is a list of the screening recommended for you and due dates:  Health Maintenance  Topic Date Due   COVID-19 Vaccine (5 - 2024-25 season) 08/03/2023   Flu Shot  07/02/2024   Medicare Annual Wellness Visit  05/04/2025   DTaP/Tdap/Td vaccine (4 - Td or Tdap) 07/18/2029   Pneumonia Vaccine  Completed   DEXA scan (bone density measurement)  Completed   Zoster (Shingles) Vaccine  Completed   HPV Vaccine  Aged Out   Meningitis B Vaccine  Aged Out    Advanced directives: (Copy Requested) Please bring a copy  of your health care power of attorney and living will to the office to be added to your chart at your convenience. You can mail to [facility] [address] [place], [place] [address] or email to [email or web address] Advance Care Planning is important because it:  [x]  Makes sure you receive the medical care that is consistent with your values, goals, and preferences  [x]  It provides guidance to your family and loved ones and reduces their decisional burden about whether or not they are making the right decisions based on your wishes.  Follow the link provided in your after visit summary or read over the paperwork we have mailed to you to help you started getting your Advance Directives in place. If you need assistance in completing these, please reach out to us  so that we can help you!  See attachments for Preventive Care and Fall Prevention Tips.

## 2024-05-04 NOTE — Progress Notes (Signed)
 Subjective:   Veronica Garza is a 83 y.o. who presents for a Medicare Wellness preventive visit.  As a reminder, Annual Wellness Visits don't include a physical exam, and some assessments may be limited, especially if this visit is performed virtually. We may recommend an in-person follow-up visit with your provider if needed.  Visit Complete: Virtual I connected with  [name] on 05/04/24 by a audio enabled telemedicine application and verified that I am speaking with the correct person using two identifiers.  Patient Location: Home  Provider Location: Home Office  I discussed the limitations of evaluation and management by telemedicine. The patient expressed understanding and agreed to proceed.  Vital Signs: Because this visit was a virtual/telehealth visit, some criteria may be missing or patient reported. Any vitals not documented were not able to be obtained and vitals that have been documented are patient reported.  VideoDeclined- This patient declined Librarian, academic. Therefore the visit was completed with audio only.  Persons Participating in Visit: Patient.  AWV Questionnaire: No: Patient Medicare AWV questionnaire was not completed prior to this visit.  Cardiac Risk Factors include: advanced age (>58men, >54 women);hypertension;Other (see comment);dyslipidemia, Risk factor comments: IDL OSA     Objective:     Today's Vitals   05/04/24 1304  Weight: 210 lb (95.3 kg)  Height: 4\' 11"  (1.499 m)   Body mass index is 42.41 kg/m.     05/04/2024    1:15 PM 04/11/2023   11:20 AM 12/06/2021    6:27 PM 04/26/2021   11:14 AM 08/17/2019    4:32 PM 08/12/2019   12:08 PM 06/22/2019    2:18 PM  Advanced Directives  Does Patient Have a Medical Advance Directive? Yes Yes Yes No Yes Yes Yes  Type of Estate agent of [place];Living will Healthcare Power of [place];Living will   Healthcare Power of [place];Living will  Healthcare Power of [place];Living will Healthcare Power of [place];Living will  Does patient want to make changes to medical advance directive?     No - Patient declined No - Patient declined   Copy of Healthcare Power of Attorney in Chart? No - copy requested    No - copy requested No - copy requested No - copy requested  Would patient like information on creating a medical advance directive?    No - Patient declined       Current Medications (verified) Outpatient Encounter Medications as of 05/04/2024  Medication Sig   acetaminophen  (TYLENOL ) 650 MG CR tablet Take 650-1,300 mg by mouth every 8 (eight) hours as needed for pain.   Calcium  Carb-Cholecalciferol (CALTRATE 600+D3 PO) Take 1 tablet by mouth in the morning and at bedtime.   irbesartan  (AVAPRO ) 300 MG tablet Take 1 tablet (300 mg total) by mouth every evening.   Multiple Vitamins-Minerals (MULTIVITAMIN WITH MINERALS) tablet Take 1 tablet by mouth in the morning. Centrum   omeprazole  (PRILOSEC) 40 MG capsule TAKE ONE CAPSULE BY MOUTH DAILY (Patient taking differently: Take 40 mg by mouth in the morning.)   rosuvastatin  (CRESTOR ) 20 MG tablet TAKE ONE TABLET BY MOUTH ONCE DAILY IN THE MORNING   solifenacin  (VESICARE ) 10 MG tablet Take 1 tablet (10 mg total) by mouth daily.   TURMERIC PO Take 1,000 mg by mouth in the morning.   famotidine  (PEPCID ) 20 MG tablet TAKE ONE TABLET BY MOUTH AT BEDTIME (Patient not taking: Reported on 05/04/2024)   hydrALAZINE  (APRESOLINE ) 50 MG tablet TAKE ONE TABLET BY MOUTH  THREE TIMES DAILY (Patient not taking: Reported on 05/04/2024)   Multiple Vitamins-Minerals (PRESERVISION AREDS 2 PO) Take 1 capsule by mouth 2 (two) times daily. (Patient not taking: Reported on 05/04/2024)   PARoxetine  (PAXIL ) 10 MG tablet Take 1 tablet (10 mg total) by mouth daily. (Patient not taking: Reported on 05/04/2024)   Propylene Glycol (SYSTANE BALANCE) 0.6 % SOLN Place 1 drop into both eyes 3 (three) times daily as needed (dry eyes).  (Patient not taking: Reported on 05/04/2024)   rosuvastatin  (CRESTOR ) 20 MG tablet TAKE ONE TABLET BY MOUTH ONCE DAILY IN THE MORNING (Patient not taking: Reported on 05/04/2024)   No facility-administered encounter medications on file as of 05/04/2024.    Allergies (verified) Tolterodine , Iohexol , Naproxen, Neomycin-bacitracin zn-polymyx, and Sulfonamide derivatives   History: Past Medical History:  Diagnosis Date   Anemia    Arthritis    BCC (basal cell carcinoma) 07/19/2014   Right cheek   Cataract    Complication of anesthesia    takes patient a long time to wake up   GERD (gastroesophageal reflux disease)    History of hiatal hernia    Hx of shoulder replacement    lt   Hypertension    OSA (obstructive sleep apnea)    use CPAP   Pneumonia    History of   Urinary incontinence    Past Surgical History:  Procedure Laterality Date   ABDOMINAL HYSTERECTOMY     EYE SURGERY     bilateral cataract extraction   FINGER SURGERY     cyst left middle finger   JOINT REPLACEMENT     left shoulder   LUMBAR LAMINECTOMY/DECOMPRESSION MICRODISCECTOMY Left 05/02/2021   Procedure: Laminectomy and Foraminotomy - Lumbar four-Lumbar five - left;  Surgeon: Isadora Mar, MD;  Location: [facility] OR;  Service: Neurosurgery;  Laterality: Left;   NASAL SINUS SURGERY     SHOULDER ARTHROSCOPY     Left   TONSILLECTOMY     removed as a child   TOTAL KNEE ARTHROPLASTY Right 08/17/2019   Procedure: TOTAL KNEE ARTHROPLASTY;  Surgeon: Claiborne Crew, MD;  Location: [facility] ORS;  Service: Orthopedics;  Laterality: Right;  70 mins   TOTAL SHOULDER ARTHROPLASTY  10/10/2011   Procedure: TOTAL SHOULDER ARTHROPLASTY;  Surgeon: Genevieve Kerry Supple;  Location: [facility] OR;  Service: Orthopedics;  Laterality: Right;   UPPER GASTROINTESTINAL ENDOSCOPY     Vaginal Sling     Family History  Problem Relation Age of Onset   Cancer Father        Prostate cancer   Cancer Sister        Ovarian cancer   Diabetes Sister    Alzheimer's  disease Sister    Alzheimer's disease Mother    COPD Neg Hx    Alcohol abuse Neg Hx    Early death Neg Hx    Heart disease Neg Hx    Hyperlipidemia Neg Hx    Hypertension Neg Hx    Kidney disease Neg Hx    Stroke Neg Hx    Social History   Socioeconomic History   Marital status: Married    Spouse name: Not on file   Number of children: 1   Years of education: Not on file   Highest education level: 12th grade  Occupational History   Occupation: Retired  Tobacco Use   Smoking status: Never   Smokeless tobacco: Never  Vaping Use   Vaping status: Never Used  Substance and Sexual Activity   Alcohol use: No  Drug use: No   Sexual activity: Not Currently  Other Topics Concern   Not on file  Social History Narrative   Regular exercise-YesRetired Married-1 boy   Lives with husband/2025   Social Drivers of Health   Financial Resource Strain: Low Risk  (05/04/2024)   Overall Financial Resource Strain (CARDIA)    Difficulty of Paying Living Expenses: Not hard at all  Food Insecurity: No Food Insecurity (05/04/2024)   Hunger Vital Sign    Worried About Running Out of Food in the Last Year: Never true    Ran Out of Food in the Last Year: Never true  Transportation Needs: No Transportation Needs (05/04/2024)   PRAPARE - Administrator, Civil Service (Medical): No    Lack of Transportation (Non-Medical): No  Physical Activity: Inactive (05/04/2024)   Exercise Vital Sign    Days of Exercise per Week: 0 days    Minutes of Exercise per Session: 0 min  Stress: Stress Concern Present (05/04/2024)   [employer] of Occupational Health - Occupational Stress Questionnaire    Feeling of Stress : To some extent  Social Connections: Moderately Isolated (05/04/2024)   Social Connection and Isolation Panel [NHANES]    Frequency of Communication with Friends and Family: Three times a week    Frequency of Social Gatherings with Friends and Family: Three times a week    Attends  Religious Services: Never    Active Member of Clubs or Organizations: No    Attends Banker Meetings: Never    Marital Status: Married    Tobacco Counseling Counseling given: Not Answered    Clinical Intake:  Pre-visit preparation completed: Yes  Pain : No/denies pain     BMI - recorded: 42.41 Nutritional Status: BMI > 30  Obese Nutritional Risks: None Diabetes: No  Lab Results  Component Value Date   HGBA1C 6.1 04/16/2023   HGBA1C 5.8 01/29/2022   HGBA1C 6.0 08/22/2021     How often do you need to have someone help you when you read instructions, pamphlets, or other written materials from your doctor or pharmacy?: 1 - Never  Interpreter Needed?: No  Information entered by :: Orlyn Odonoghue,RMA   Activities of Daily Living     05/04/2024    1:05 PM  In your present state of health, do you have any difficulty performing the following activities:  Hearing? 1  Comment wears hearing aides  Vision? 0  Difficulty concentrating or making decisions? 0  Walking or climbing stairs? 0  Dressing or bathing? 0  Doing errands, shopping? 0  Preparing Food and eating ? N  Using the Toilet? N  In the past six months, have you accidently leaked urine? Y  Do you have problems with loss of bowel control? N  Managing your Medications? N  Managing your Finances? N  Housekeeping or managing your Housekeeping? N    Patient Care Team: Adelia Homestead, MD as PCP - General (Internal Medicine) Lind Repine, MD as Consulting Physician (Pulmonary Disease) Nandigam, Kavitha V, MD as Consulting Physician (Gastroenterology) Szabat, Tino Foreman, [facility] (Inactive) as Pharmacist (Pharmacist)  I have updated your Care Teams any recent Medical Services you may have received from other providers in the past year.     Assessment:    This is a routine wellness examination for [facility].  Hearing/Vision screen Hearing Screening - Comments:: wears hearing aides Vision  Screening - Comments:: Wears eyeglasses/Dr. Susana [facility]   Goals Addressed   None  Depression Screen     05/04/2024    1:23 PM 04/16/2023   10:38 AM 04/11/2023   11:19 AM 08/22/2021    9:03 AM 08/17/2020    2:39 PM 06/22/2019    2:21 PM 05/19/2019    1:31 PM  PHQ 2/9 Scores  PHQ - 2 Score 0 0 0 0 0 0 0  PHQ- 9 Score 3 0         Fall Risk     05/04/2024    1:16 PM 04/16/2023   10:38 AM 04/11/2023   11:19 AM 01/29/2022    1:29 PM 08/22/2021    9:03 AM  Fall Risk   Falls in the past year? 0 0 0 1 0  Number falls in past yr: 0 0 0 0   Injury with Fall? 0 0 0 1   Risk for fall due to : Impaired balance/gait;Medication side effect  No Fall Risks    Follow up Falls evaluation completed;Falls prevention discussed Falls evaluation completed Falls evaluation completed      MEDICARE RISK AT HOME:  Medicare Risk at Home Any stairs in or around the home?: Yes (in the back) If so, are there any without handrails?: Yes Home free of loose throw rugs in walkways, pet beds, electrical cords, etc?: Yes Adequate lighting in your home to reduce risk of falls?: Yes Life alert?: No Use of a cane, walker or w/c?: Yes (cane) Grab bars in the bathroom?: Yes Shower chair or bench in shower?: Yes Elevated toilet seat or a handicapped toilet?: Yes  TIMED UP AND GO:  Was the test performed?  No  Cognitive Function: 6CIT completed    01/01/2018   10:31 AM  MMSE - Mini Mental State Exam  Orientation to time 5  Orientation to Place 5  Registration 3  Attention/ Calculation 5  Recall 2  Language- name 2 objects 2  Language- repeat 1  Language- follow 3 step command 3  Language- read & follow direction 1  Write a sentence 1  Copy design 1  Total score 29        05/04/2024    1:17 PM 04/11/2023   11:38 AM  6CIT Screen  What Year? 0 points 0 points  What month? 0 points 0 points  What time? 0 points 0 points  Count back from 20 0 points 0 points  Months in reverse 2 points 0 points   Repeat phrase 10 points 2 points  Total Score 12 points 2 points    Immunizations Immunization History  Administered Date(s) Administered   Fluad Quad(high Dose 65+) 09/10/2019, 08/17/2020, 09/12/2021   Influenza Split 09/04/2012   Influenza Whole 08/30/2009, 09/18/2010, 08/08/2011   Influenza, High Dose Seasonal PF 08/30/2014, 09/02/2016, 09/11/2017, 09/22/2018   Influenza,inj,Quad PF,6+ Mos 09/02/2013, 09/01/2015   Moderna Covid-19 Vaccine Bivalent Booster 70yrs & up 10/18/2021   Moderna Sars-Covid-2 Vaccination 12/16/2019, 01/14/2020, 10/07/2020   Pneumococcal Conjugate-13 01/01/2018   Pneumococcal Polysaccharide-23 05/19/2019   Td 04/06/2010   Td (Adult), 2 Lf Tetanus Toxid, Preservative Free 04/06/2010   Tdap 07/19/2019   Zoster Recombinant(Shingrix) 12/11/2019, 03/10/2020   Zoster, Live 09/04/2012    Screening Tests Health Maintenance  Topic Date Due   COVID-19 Vaccine (5 - 2024-25 season) 08/03/2023   INFLUENZA VACCINE  07/02/2024   Medicare Annual Wellness (AWV)  05/04/2025   DTaP/Tdap/Td (4 - Td or Tdap) 07/18/2029   Pneumonia Vaccine 76+ Years old  Completed   DEXA SCAN  Completed   Zoster Vaccines-  Shingrix  Completed   HPV VACCINES  Aged Out   Meningococcal B Vaccine  Aged Out    Health Maintenance  Health Maintenance Due  Topic Date Due   COVID-19 Vaccine (5 - 2024-25 season) 08/03/2023   Health Maintenance Items Addressed: DEXA ordered  Additional Screening:  Vision Screening: Recommended annual ophthalmology exams for early detection of glaucoma and other disorders of the eye. Would you like a referral to an eye doctor? No    Dental Screening: Recommended annual dental exams for proper oral hygiene  Community Resource Referral / Chronic Care Management: CRR required this visit?  No   CCM required this visit?  No   Plan:    I have personally reviewed and noted the following in the patient's chart:   Medical and social history Use of  alcohol, tobacco or illicit drugs  Current medications and supplements including opioid prescriptions. Patient is not currently taking opioid prescriptions. Functional ability and status Nutritional status Physical activity Advanced directives List of other physicians Hospitalizations, surgeries, and ER visits in previous 12 months Vitals Screenings to include cognitive, depression, and falls Referrals and appointments  In addition, I have reviewed and discussed with patient certain preventive protocols, quality metrics, and best practice recommendations. A written personalized care plan for preventive services as well as general preventive health recommendations were provided to patient.   Lyndsi Altic L Morley Gaumer, CMA   05/04/2024   After Visit Summary: (Mail) Due to this being a telephonic visit, the after visit summary with patients personalized plan was offered to patient via mail   Notes: Please refer to Routing Comments.

## 2024-06-09 DIAGNOSIS — N3001 Acute cystitis with hematuria: Secondary | ICD-10-CM | POA: Diagnosis not present

## 2024-06-09 DIAGNOSIS — Z9181 History of falling: Secondary | ICD-10-CM | POA: Diagnosis not present

## 2024-06-09 DIAGNOSIS — R350 Frequency of micturition: Secondary | ICD-10-CM | POA: Diagnosis not present

## 2024-06-11 ENCOUNTER — Ambulatory Visit (INDEPENDENT_AMBULATORY_CARE_PROVIDER_SITE_OTHER)
Admission: RE | Admit: 2024-06-11 | Discharge: 2024-06-11 | Disposition: A | Discharge status: Home / Self Care | Source: Ambulatory Visit | Attending: Internal Medicine | Admitting: Internal Medicine

## 2024-06-11 DIAGNOSIS — Z780 Asymptomatic menopausal state: Secondary | ICD-10-CM | POA: Diagnosis not present

## 2024-06-14 ENCOUNTER — Ambulatory Visit: Payer: Self-pay | Admitting: Internal Medicine

## 2024-06-18 LAB — HM DEXA SCAN: HM Dexa Scan: 2.9

## 2024-06-23 NOTE — Telephone Encounter (Signed)
 Copied from CRM [id number]. Topic: Clinical - Lab/Test Results >> Jun 23, 2024 11:37 AM Veronica Garza wrote: Reason for CRM: Patient is requesting a call to go over her bone density results, patient can be reached at [phone].

## 2024-06-25 NOTE — Progress Notes (Signed)
 I have called and LVM for patient as well as sending her results via mail

## 2024-09-07 DIAGNOSIS — Z23 Encounter for immunization: Secondary | ICD-10-CM | POA: Diagnosis not present

## 2024-09-13 ENCOUNTER — Other Ambulatory Visit: Payer: Self-pay | Admitting: Internal Medicine

## 2024-09-13 DIAGNOSIS — N3281 Overactive bladder: Secondary | ICD-10-CM

## 2024-09-16 ENCOUNTER — Ambulatory Visit (INDEPENDENT_AMBULATORY_CARE_PROVIDER_SITE_OTHER): Admitting: Emergency Medicine

## 2024-09-16 ENCOUNTER — Encounter: Payer: Self-pay | Admitting: Emergency Medicine

## 2024-09-16 DIAGNOSIS — N3281 Overactive bladder: Secondary | ICD-10-CM

## 2024-09-16 MED ORDER — SOLIFENACIN SUCCINATE 10 MG PO TABS: 10.0000 mg | ORAL_TABLET | Freq: Every day | ORAL | 0 refills | Status: AC

## 2024-09-16 NOTE — Progress Notes (Signed)
 Veronica Garza 83 y.o.   Chief Complaint  Patient presents with   Medication Refill    Patient here for medication refill    HISTORY OF PRESENT ILLNESS: Acute problem visit today. This is a 83 y.o. female complaining of urinary frequency and urgency for several months. Was started on medication.  Needs refill.  Also needs referral to urology. No other complaints or medical concerns today.  Medication Refill Pertinent negatives include no abdominal pain, chest pain, chills, congestion, coughing, fever, headaches, nausea, rash, sore throat or vomiting.     Prior to Admission medications   Medication Sig Start Date End Date Taking? Authorizing Provider  acetaminophen  (TYLENOL ) 650 MG CR tablet Take 650-1,300 mg by mouth every 8 (eight) hours as needed for pain.   Yes [provider]  Calcium  Carb-Cholecalciferol (CALTRATE 600+D3 PO) Take 1 tablet by mouth in the morning and at bedtime.   Yes [provider]  hydrALAZINE  (APRESOLINE ) 50 MG tablet TAKE ONE TABLET BY MOUTH THREE TIMES DAILY 10/10/23  Yes Rollene Almarie LABOR, MD  irbesartan  (AVAPRO ) 300 MG tablet Take 1 tablet (300 mg total) by mouth every evening. 04/16/23  Yes Rollene Almarie LABOR, MD  Multiple Vitamins-Minerals (MULTIVITAMIN WITH MINERALS) tablet Take 1 tablet by mouth in the morning. Centrum   Yes [provider]  omeprazole  (PRILOSEC) 40 MG capsule TAKE ONE CAPSULE BY MOUTH DAILY 06/28/20  Yes Nandigam, Kavitha V, MD  PARoxetine  (PAXIL ) 10 MG tablet Take 1 tablet (10 mg total) by mouth daily. 01/19/24  Yes Rollene Almarie LABOR, MD  rosuvastatin  (CRESTOR ) 20 MG tablet TAKE ONE TABLET BY MOUTH ONCE DAILY IN THE MORNING 04/27/24  Yes Rollene Almarie LABOR, MD  TURMERIC PO Take 1,000 mg by mouth in the morning.   Yes [provider]  famotidine  (PEPCID ) 20 MG tablet TAKE ONE TABLET BY MOUTH AT BEDTIME Patient not taking: Reported on 09/16/2024 01/19/24   Rollene Almarie LABOR, MD   Multiple Vitamins-Minerals (PRESERVISION AREDS 2 PO) Take 1 capsule by mouth 2 (two) times daily. Patient not taking: Reported on 09/16/2024    [provider]  Propylene Glycol (SYSTANE BALANCE) 0.6 % SOLN Place 1 drop into both eyes 3 (three) times daily as needed (dry eyes). Patient not taking: Reported on 09/16/2024    [provider]  rosuvastatin  (CRESTOR ) 20 MG tablet TAKE ONE TABLET BY MOUTH ONCE DAILY IN THE MORNING Patient not taking: Reported on 09/16/2024 04/27/24   Rollene Almarie LABOR, MD  solifenacin  (VESICARE ) 10 MG tablet Take 1 tablet (10 mg total) by mouth daily. 09/16/24   Purcell Emil Schanz, MD    Allergies  Allergen Reactions   Tolterodine  Other (See Comments)    dizziness   Iohexol  Hives     Code: HIVES, Desc: ? Contrast reaction from CT prior to 2000.    Naproxen Itching and Rash   Neomycin-Bacitracin Zn-Polymyx Itching and Rash    REACTION: redness   Sulfonamide Derivatives Itching and Rash    tingling    Patient Active Problem List   Diagnosis Date Noted   Constipation 04/17/2023   MDD (major depressive disorder), recurrent, in full remission 04/17/2023   Deficiency anemia 08/22/2021   Chronic hyperglycemia 08/22/2021   OAB (overactive bladder) 06/22/2019   ILD (interstitial lung disease) (HCC) 12/15/2018   Enlarged heart 06/16/2018   Morbid obesity (HCC) 10/17/2017   DDD (degenerative disc disease), lumbar 11/06/2015   Diastolic dysfunction without heart failure 11/13/2014   Osteopenia of the elderly 05/27/2013  Hyperlipidemia LDL goal <130 09/04/2012   Esophageal stricture 09/12/2011   Routine general medical examination at a health care facility 08/08/2011   Essential hypertension, benign 01/14/2011   OA (osteoarthritis) 07/21/2008   Obstructive sleep apnea 10/14/2007   GERD 10/14/2007    Past Medical History:  Diagnosis Date   Anemia    Arthritis    BCC (basal cell carcinoma) 07/19/2014   Right cheek   Cataract     Complication of anesthesia    takes patient a long time to wake up   GERD (gastroesophageal reflux disease)    History of hiatal hernia    Hx of shoulder replacement    lt   Hypertension    OSA (obstructive sleep apnea)    use CPAP   Pneumonia    History of   Urinary incontinence     Past Surgical History:  Procedure Laterality Date   ABDOMINAL HYSTERECTOMY     EYE SURGERY     bilateral cataract extraction   FINGER SURGERY     cyst left middle finger   JOINT REPLACEMENT     left shoulder   LUMBAR LAMINECTOMY/DECOMPRESSION MICRODISCECTOMY Left 05/02/2021   Procedure: Laminectomy and Foraminotomy - Lumbar four-Lumbar five - left;  Surgeon: Joshua Alm RAMAN, MD;  Location: [facility] OR;  Service: Neurosurgery;  Laterality: Left;   NASAL SINUS SURGERY     SHOULDER ARTHROSCOPY     Left   TONSILLECTOMY     removed as a child   TOTAL KNEE ARTHROPLASTY Right 08/17/2019   Procedure: TOTAL KNEE ARTHROPLASTY;  Surgeon: Ernie Cough, MD;  Location: [facility] ORS;  Service: Orthopedics;  Laterality: Right;  70 mins   TOTAL SHOULDER ARTHROPLASTY  10/10/2011   Procedure: TOTAL SHOULDER ARTHROPLASTY;  Surgeon: Franky HERO Supple;  Location: [facility] OR;  Service: Orthopedics;  Laterality: Right;   UPPER GASTROINTESTINAL ENDOSCOPY     Vaginal Sling      Social History   Socioeconomic History   Marital status: Married    Spouse name: Not on file   Number of children: 1   Years of education: Not on file   Highest education level: 12th grade  Occupational History   Occupation: Retired  Tobacco Use   Smoking status: Never   Smokeless tobacco: Never  Vaping Use   Vaping status: Never Used  Substance and Sexual Activity   Alcohol use: No   Drug use: No   Sexual activity: Not Currently  Other Topics Concern   Not on file  Social History Narrative   Regular exercise-YesRetired Married-1 boy   Lives with husband/2025   Social Drivers of Health   Financial Resource Strain: Low Risk  (05/04/2024)   Overall  Financial Resource Strain (CARDIA)    Difficulty of Paying Living Expenses: Not hard at all  Food Insecurity: No Food Insecurity (05/04/2024)   Hunger Vital Sign    Worried About Running Out of Food in the Last Year: Never true    Ran Out of Food in the Last Year: Never true  Transportation Needs: No Transportation Needs (05/04/2024)   PRAPARE - Administrator, Civil Service (Medical): No    Lack of Transportation (Non-Medical): No  Physical Activity: Inactive (05/04/2024)   Exercise Vital Sign    Days of Exercise per Week: 0 days    Minutes of Exercise per Session: 0 min  Stress: Stress Concern Present (05/04/2024)   [employer] of Occupational Health - Occupational Stress Questionnaire    Feeling of Stress :  To some extent  Social Connections: Moderately Isolated (05/04/2024)   Social Connection and Isolation Panel    Frequency of Communication with Friends and Family: Three times a week    Frequency of Social Gatherings with Friends and Family: Three times a week    Attends Religious Services: Never    Active Member of Clubs or Organizations: No    Attends Banker Meetings: Never    Marital Status: Married  Catering manager Violence: Not At Risk (05/04/2024)   Humiliation, Afraid, Rape, and Kick questionnaire    Fear of Current or Ex-Partner: No    Emotionally Abused: No    Physically Abused: No    Sexually Abused: No    Family History  Problem Relation Age of Onset   Cancer Father        Prostate cancer   Cancer Sister        Ovarian cancer   Diabetes Sister    Alzheimer's disease Sister    Alzheimer's disease Mother    COPD Neg Hx    Alcohol abuse Neg Hx    Early death Neg Hx    Heart disease Neg Hx    Hyperlipidemia Neg Hx    Hypertension Neg Hx    Kidney disease Neg Hx    Stroke Neg Hx      Review of Systems  Constitutional: Negative.  Negative for chills and fever.  HENT: Negative.  Negative for congestion and sore throat.    Respiratory: Negative.  Negative for cough and shortness of breath.   Cardiovascular: Negative.  Negative for chest pain and palpitations.  Gastrointestinal:  Negative for abdominal pain, diarrhea, nausea and vomiting.  Genitourinary:  Positive for frequency and urgency.  Skin: Negative.  Negative for rash.  Neurological: Negative.  Negative for dizziness and headaches.  All other systems reviewed and are negative.   Vitals:   09/16/24 1354  BP: (!) 154/94  Pulse: 75  Temp: 98.2 F (36.8 C)  SpO2: 95%    Physical Exam Vitals reviewed.  Constitutional:      Appearance: She is obese.  HENT:     Head: Normocephalic.  Eyes:     Extraocular Movements: Extraocular movements intact.  Cardiovascular:     Rate and Rhythm: Normal rate.  Pulmonary:     Effort: Pulmonary effort is normal.  Abdominal:     Palpations: Abdomen is soft.     Tenderness: There is no abdominal tenderness.  Skin:    General: Skin is warm and dry.     Capillary Refill: Capillary refill takes less than 2 seconds.  Neurological:     Mental Status: She is alert and oriented to person, place, and time.  Psychiatric:        Mood and Affect: Mood normal.        Behavior: Behavior normal.      ASSESSMENT & PLAN: Problem List Items Addressed This Visit       Genitourinary   OAB (overactive bladder)   Active symptoms and affecting quality of life States medication not helping as much as in the beginning Recommend urology evaluation Referral placed today For now continue Vesicare  10 mg daily Also needs to follow-up with PCP      Relevant Medications   solifenacin  (VESICARE ) 10 MG tablet   Other Relevant Orders   Ambulatory referral to Urology   Patient Instructions  Overactive Bladder in Adults: What to Know  Overactive bladder (OAB) is when you have trouble holding your pee.  You may feel the need to pee often or all of a sudden. You may also leak pee if you can't get to a bathroom fast  enough. These symptoms may get in the way of your daily life. What are the causes? OAB may be caused by poor nerve signals between your bladder and your brain. Your bladder may get the signal to empty before it's full. Or, your bladder may be too sensitive. This can cause it to squeeze too soon. Other causes include: Medical problems, such as: A urinary tract infection (UTI). Swelling or stones in your bladder. A tumor, which is a growth of cells that isn't normal. Diabetes. Muscle or nerve weakness. This may be caused by: An injury to your spinal cord. A stroke. Multiple sclerosis. Surgery on nearby tissues. Drinking caffeine or alcohol. Some medicines. These include ones to lower the amount of fluid in your body. Trouble pooping, also called constipation. What increases the risk? You may be more at risk if: You're an older adult. You're going through menopause. You have a big prostate or problems with your prostate. You're overweight. You smoke. You eat or drink things that irritate your bladder. These may include tea or spicy food. What are the signs or symptoms? A sudden, strong need to pee. Peeing 8 or more times a day. Leaking pee. Waking up to pee 2 or more times a night. How is this diagnosed? You may be diagnosed based on your symptoms, medical history, and an exam. You may also have tests. These may include: Blood tests. Tests of your pee to check for an infection. Tests to check the flow of your pee. These are called urodynamic tests. You may also need to see an expert in problems with the bladder called a urologist. How is this treated? Treatment depends on the cause and how bad your symptoms are. It may include: Bladder training, such as: Learning to control the urge to pee by peeing at certain times. Doing Kegel exercises. These can help you strengthen the muscles that start and stop the flow of pee. Special devices, such as: Biofeedback. This uses sensors to  help you learn when to pee. Electrical stimulation. This uses electricity to make the nerves and muscles of your bladder work. A pessary. This can be put in the vagina to support the bladder. Medicines to: Treat a UTI. Stop the bladder from releasing pee at the wrong time. Calm the bladder muscles. Surgery to: Help the nerve signals that control peeing. Change the shape of your bladder. This is done only in very bad cases. Follow these instructions at home: Eating and drinking  Make changes to your diet as told. You may need to: Drink small amounts of fluid during the day rather than large amounts at one time. Take in less caffeine or alcohol. Take steps to help treat or prevent trouble pooping. You may need to eat foods high in fiber, like beans, whole grains, and fresh fruits and vegetables. Lifestyle Lose weight if needed. Do not smoke, vape, or use nicotine or tobacco. General instructions Take your medicines only as told. If you were given antibiotics, take them as told. Do not stop taking them even if you start to feel better. Use devices as told. If needed, wear pads to absorb pee leakage. Keep track of how much you drink, when you drink, and when you need to pee. Contact a health care provider if: Your symptoms don't get better with treatment. You can't control your bladder. You  have a fever or chills. This information is not intended to replace advice given to you by your health care provider. Make sure you discuss any questions you have with your health care provider. Document Revised: 06/16/2023 Document Reviewed: 06/16/2023 Elsevier Patient Education  2024 [employer] Inc[employer]     Emil Schaumann, MD [facility]

## 2024-09-16 NOTE — Assessment & Plan Note (Signed)
 Active symptoms and affecting quality of life States medication not helping as much as in the beginning Recommend urology evaluation Referral placed today For now continue Vesicare  10 mg daily Also needs to follow-up with PCP

## 2024-09-16 NOTE — Patient Instructions (Signed)

## 2024-09-29 DIAGNOSIS — Z961 Presence of intraocular lens: Secondary | ICD-10-CM | POA: Diagnosis not present

## 2024-09-29 DIAGNOSIS — H43813 Vitreous degeneration, bilateral: Secondary | ICD-10-CM | POA: Diagnosis not present

## 2024-09-29 DIAGNOSIS — H04123 Dry eye syndrome of bilateral lacrimal glands: Secondary | ICD-10-CM | POA: Diagnosis not present

## 2024-09-29 DIAGNOSIS — H353131 Nonexudative age-related macular degeneration, bilateral, early dry stage: Secondary | ICD-10-CM | POA: Diagnosis not present

## 2024-10-26 DIAGNOSIS — R351 Nocturia: Secondary | ICD-10-CM | POA: Diagnosis not present

## 2024-10-26 DIAGNOSIS — N3946 Mixed incontinence: Secondary | ICD-10-CM | POA: Diagnosis not present

## 2024-10-26 DIAGNOSIS — R350 Frequency of micturition: Secondary | ICD-10-CM | POA: Diagnosis not present

## 2025-05-06 ENCOUNTER — Ambulatory Visit
